# Patient Record
Sex: Female | Born: 1980 | Race: White | Hispanic: No | Marital: Single | Smoking: Never smoker
Health system: Southern US, Community
[De-identification: ages and names within clinical notes are randomized; demographics above are authoritative.]

## PROBLEM LIST (undated history)

## (undated) ENCOUNTER — Inpatient Hospital Stay (HOSPITAL_COMMUNITY): Payer: Self-pay

## (undated) DIAGNOSIS — R87613 High grade squamous intraepithelial lesion on cytologic smear of cervix (HGSIL): Secondary | ICD-10-CM

## (undated) DIAGNOSIS — Z923 Personal history of irradiation: Secondary | ICD-10-CM

## (undated) DIAGNOSIS — R059 Cough, unspecified: Secondary | ICD-10-CM

## (undated) DIAGNOSIS — J4530 Mild persistent asthma, uncomplicated: Secondary | ICD-10-CM

## (undated) DIAGNOSIS — E890 Postprocedural hypothyroidism: Secondary | ICD-10-CM

## (undated) DIAGNOSIS — C801 Malignant (primary) neoplasm, unspecified: Secondary | ICD-10-CM

## (undated) DIAGNOSIS — Z87410 Personal history of cervical dysplasia: Secondary | ICD-10-CM

## (undated) DIAGNOSIS — E0590 Thyrotoxicosis, unspecified without thyrotoxic crisis or storm: Secondary | ICD-10-CM

## (undated) DIAGNOSIS — G43909 Migraine, unspecified, not intractable, without status migrainosus: Secondary | ICD-10-CM

## (undated) DIAGNOSIS — Z87442 Personal history of urinary calculi: Secondary | ICD-10-CM

## (undated) DIAGNOSIS — Z8639 Personal history of other endocrine, nutritional and metabolic disease: Secondary | ICD-10-CM

## (undated) DIAGNOSIS — Z8619 Personal history of other infectious and parasitic diseases: Secondary | ICD-10-CM

## (undated) DIAGNOSIS — R05 Cough: Secondary | ICD-10-CM

## (undated) DIAGNOSIS — G40909 Epilepsy, unspecified, not intractable, without status epilepticus: Secondary | ICD-10-CM

## (undated) DIAGNOSIS — R87629 Unspecified abnormal cytological findings in specimens from vagina: Secondary | ICD-10-CM

## (undated) HISTORY — DX: Personal history of irradiation: Z92.3

## (undated) HISTORY — PX: LEEP: SHX91

## (undated) HISTORY — DX: Migraine, unspecified, not intractable, without status migrainosus: G43.909

## (undated) HISTORY — DX: Epilepsy, unspecified, not intractable, without status epilepticus: G40.909

## (undated) HISTORY — DX: High grade squamous intraepithelial lesion on cytologic smear of cervix (HGSIL): R87.613

## (undated) HISTORY — DX: Thyrotoxicosis, unspecified without thyrotoxic crisis or storm: E05.90

## (undated) HISTORY — DX: Mild persistent asthma, uncomplicated: J45.30

## (undated) HISTORY — PX: OTHER SURGICAL HISTORY: SHX169

---
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
## 2002-05-12 ENCOUNTER — Other Ambulatory Visit: Admission: RE | Admit: 2002-05-12 | Discharge: 2002-05-12 | Payer: Self-pay | Admitting: *Deleted

## 2002-07-01 ENCOUNTER — Ambulatory Visit (HOSPITAL_COMMUNITY): Admission: RE | Admit: 2002-07-01 | Discharge: 2002-07-01 | Payer: Self-pay | Admitting: General Surgery

## 2002-07-01 ENCOUNTER — Encounter: Payer: Self-pay | Admitting: General Surgery

## 2002-12-19 ENCOUNTER — Ambulatory Visit (HOSPITAL_COMMUNITY): Admission: RE | Admit: 2002-12-19 | Discharge: 2002-12-19 | Payer: Self-pay | Admitting: General Surgery

## 2002-12-19 ENCOUNTER — Encounter: Payer: Self-pay | Admitting: General Surgery

## 2004-01-25 ENCOUNTER — Ambulatory Visit (HOSPITAL_COMMUNITY): Admission: RE | Admit: 2004-01-25 | Discharge: 2004-01-25 | Payer: Self-pay | Admitting: General Surgery

## 2004-08-04 HISTORY — PX: NASAL SEPTUM SURGERY: SHX37

## 2005-02-26 ENCOUNTER — Other Ambulatory Visit: Admission: RE | Admit: 2005-02-26 | Discharge: 2005-02-26 | Payer: Self-pay | Admitting: *Deleted

## 2005-06-13 ENCOUNTER — Ambulatory Visit (HOSPITAL_COMMUNITY): Admission: RE | Admit: 2005-06-13 | Discharge: 2005-06-13 | Payer: Self-pay | Admitting: Otolaryngology

## 2005-06-13 ENCOUNTER — Encounter (INDEPENDENT_AMBULATORY_CARE_PROVIDER_SITE_OTHER): Payer: Self-pay | Admitting: *Deleted

## 2005-06-13 ENCOUNTER — Ambulatory Visit (HOSPITAL_BASED_OUTPATIENT_CLINIC_OR_DEPARTMENT_OTHER): Admission: RE | Admit: 2005-06-13 | Discharge: 2005-06-13 | Payer: Self-pay | Admitting: Otolaryngology

## 2007-06-24 ENCOUNTER — Other Ambulatory Visit: Admission: RE | Admit: 2007-06-24 | Discharge: 2007-06-24 | Payer: Self-pay | Admitting: Family Medicine

## 2007-07-26 ENCOUNTER — Encounter (INDEPENDENT_AMBULATORY_CARE_PROVIDER_SITE_OTHER): Payer: Self-pay | Admitting: Obstetrics & Gynecology

## 2007-07-26 ENCOUNTER — Ambulatory Visit (HOSPITAL_COMMUNITY): Admission: RE | Admit: 2007-07-26 | Discharge: 2007-07-26 | Payer: Self-pay | Admitting: Obstetrics & Gynecology

## 2007-08-05 HISTORY — PX: DILATION AND CURETTAGE OF UTERUS: SHX78

## 2007-08-05 HISTORY — PX: SIGMOIDOSCOPY: SHX6686

## 2007-11-03 ENCOUNTER — Emergency Department (HOSPITAL_COMMUNITY): Admission: EM | Admit: 2007-11-03 | Discharge: 2007-11-03 | Payer: Self-pay | Admitting: Emergency Medicine

## 2009-08-04 DIAGNOSIS — Z87442 Personal history of urinary calculi: Secondary | ICD-10-CM

## 2009-08-04 HISTORY — DX: Personal history of urinary calculi: Z87.442

## 2009-09-03 ENCOUNTER — Emergency Department (HOSPITAL_COMMUNITY): Admission: EM | Admit: 2009-09-03 | Discharge: 2009-09-03 | Payer: Self-pay | Admitting: Emergency Medicine

## 2009-11-26 ENCOUNTER — Encounter (HOSPITAL_COMMUNITY): Admission: RE | Admit: 2009-11-26 | Discharge: 2010-02-24 | Payer: Self-pay | Admitting: Endocrinology

## 2009-12-02 DIAGNOSIS — Z923 Personal history of irradiation: Secondary | ICD-10-CM

## 2009-12-02 HISTORY — DX: Personal history of irradiation: Z92.3

## 2010-02-01 DIAGNOSIS — R87613 High grade squamous intraepithelial lesion on cytologic smear of cervix (HGSIL): Secondary | ICD-10-CM

## 2010-02-01 HISTORY — DX: High grade squamous intraepithelial lesion on cytologic smear of cervix (HGSIL): R87.613

## 2010-03-14 ENCOUNTER — Ambulatory Visit: Admission: RE | Admit: 2010-03-14 | Discharge: 2010-03-14 | Payer: Self-pay | Admitting: Gynecologic Oncology

## 2010-03-29 ENCOUNTER — Ambulatory Visit (HOSPITAL_COMMUNITY): Admission: RE | Admit: 2010-03-29 | Discharge: 2010-03-29 | Payer: Self-pay | Admitting: Obstetrics and Gynecology

## 2010-10-03 ENCOUNTER — Other Ambulatory Visit: Payer: Self-pay | Admitting: Obstetrics and Gynecology

## 2010-10-18 LAB — CBC
HCT: 42.8 % (ref 36.0–46.0)
MCH: 26.9 pg (ref 26.0–34.0)
MCV: 80.2 fL (ref 78.0–100.0)
Platelets: 284 10*3/uL (ref 150–400)
RBC: 5.33 MIL/uL — ABNORMAL HIGH (ref 3.87–5.11)

## 2010-10-21 LAB — POCT PREGNANCY, URINE
Preg Test, Ur: NEGATIVE
Preg Test, Ur: NEGATIVE

## 2010-10-21 LAB — URINE MICROSCOPIC-ADD ON

## 2010-10-21 LAB — URINALYSIS, ROUTINE W REFLEX MICROSCOPIC
Bilirubin Urine: NEGATIVE
Glucose, UA: NEGATIVE mg/dL
Specific Gravity, Urine: 1.012 (ref 1.005–1.030)
Urobilinogen, UA: 0.2 mg/dL (ref 0.0–1.0)
pH: 8 (ref 5.0–8.0)

## 2010-12-17 NOTE — Op Note (Signed)
Kelly Mcclure, Kelly Mcclure              ACCOUNT NO.:  [id number]   MEDICAL RECORD NO.:  [id number]          PATIENT TYPE:  AMB   LOCATION:  SDC                           FACILITY:  [facility]   PHYSICIAN:  Ilda Mori, M.D.   DATE OF BIRTH:  04-Feb-1981   DATE OF PROCEDURE:  07/26/2007  DATE OF DISCHARGE:                               OPERATIVE REPORT   PREOPERATIVE DIAGNOSIS:  Missed abortion.   POSTOPERATIVE DIAGNOSIS:  Missed abortion.   PROCEDURE:  Dilatation and evacuation.   SURGEON:  Ilda Mori, M.D.   ANESTHESIA:  Paracervical block with IV sedation.   FINDINGS:  Products of conception consistent with an 8-week missed  abortion.   ESTIMATED BLOOD LOSS:  20 mL.   COMPLICATIONS:  None.   INDICATIONS:  This is a 30 year old primigravid female, who presented to  the office at 8 weeks' gestation with an ultrasound that showed no  development of a fetal pole.  Quantitative beta subunits were falling  over a 2-day period of observation and the diagnosis of missed abortion  was confirmed.  Options were discussed with the patient, who elected to  proceed with surgical evacuation.   PROCEDURE:  The patient was placed in the dorsal lithotomy position and  IV sedation was administered.  The perineum and vagina were prepped and  draped in a sterile fashion.  The anterior lip of the cervix was grasped  with single-tooth tenaculum and 10 mL of 2% lidocaine was infiltrated in  the paracervical tissues.  The internal os was dilated to a 21-French  and a #7 suction curette was introduced into the endometrial cavity and  the products of conception were evacuated.  The procedure was then  terminated and the patient left the operating room in good condition.      Ilda Mori, M.D.  Electronically Signed     RK/MEDQ  D:  07/26/2007  T:  07/27/2007  Job:  [id number]

## 2010-12-20 NOTE — Discharge Summary (Signed)
NAMEDAVEAH, VARONE              ACCOUNT NO.:  [id number]   MEDICAL RECORD NO.:  [id number]          PATIENT TYPE:  OUT   LOCATION:  [facility]                         FACILITY:  [facility]   PHYSICIAN:  Kristine Garbe. Ezzard Standing, M.D.DATE OF BIRTH:  May 24, 1981   DATE OF ADMISSION:  06/13/2005  DATE OF DISCHARGE:  06/13/2005                                 DISCHARGE SUMMARY   ADMITTING DIAGNOSES:  1.  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  2.  History of seizure disorder.   OPERATIONS:  During this hospitalization:  1.  Septoplasty.  2.  Turbinate reductions on June 13, 2005.   CONSULTATIONS:  None.   HOSPITAL COURSE:  The patient was admitted via the operating room, on  June 13, 2005, and underwent septoplasty and turbinate reductions.  Postoperatively she did well and was subsequently discharged home with a  packing in her nose.   DISCHARGE MEDICATIONS:  1.  Vicodin 1-2 q.4h. p.r.n. pain.  2.  Keflex 500 mg b.i.d. for one week.  In addition to her regular medications Topamax, Synthroid, atenolol, and  Aleve.   DISPOSITION:  The patient is discharged home.  We will plan on removing the  packing in the morning and have her follow up in my office in one week for  recheck.           ______________________________  Kristine Garbe. Ezzard Standing, M.D.     CEN/MEDQ  D:  07/11/2005  T:  07/12/2005  Job:  [id number]

## 2010-12-20 NOTE — Op Note (Signed)
NAMELYNNDA, WIERSMA              ACCOUNT NO.:  [id number]   MEDICAL RECORD NO.:  [id number]          PATIENT TYPE:  AMB   LOCATION:  DSC                          FACILITY:  [facility]   PHYSICIAN:  Christopher E. Ezzard Standing, M.D.DATE OF BIRTH:  09-30-1980   DATE OF PROCEDURE:  06/13/2005  DATE OF DISCHARGE:                                 OPERATIVE REPORT   PREOPERATIVE DIAGNOSIS:  Septal deviation with nasal obstruction, turbinate  hypertrophy, snoring.   POSTOPERATIVE DIAGNOSIS:  Septal deviation with nasal obstruction, turbinate  hypertrophy, snoring.   OPERATION:  Septoplasty with bilateral inferior turbinate reductions.   SURGEON:  Kristine Garbe. Ezzard Standing, M.D.   ANESTHESIA:  General endotracheal anesthesia.   COMPLICATIONS:  None.   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  She has had a history of nasal obstruction, generally worse on  the right side.  She also complains of snoring.  She is taken to the  operating room at this time for septoplasty and turbinate reduction to help  improve her nasal airway.   DESCRIPTION OF PROCEDURE:  After adequate endotracheal anesthesia, the  patient received 1 gram Ancef IV preoperatively.  The nose was examined.  She had curvature of the anterior cartilaginous septum to the right airway  with a narrowed right nasal airway compared to the left, more posteriorly  she had a large bony spur posterior on the left side.  In addition, she had  very thickened hypertrophied septal mucosa, especially on the left side.  I  suspect this is complicating for the curvature of the septum to the right.  Because this was very thickened and almost appeared as a hemangioma or  lymphangioma, a frozen section biopsy was obtained from the septal mucosa on  the left side and sent for frozen.  The pathology report revealed only  normal appearing mucosa with excessive submucosal vasculature consistent  with benign  hypertrophy of the mucosa.  The septum and turbinates were  injected with Xylocaine with epinephrine.  A hemitransfixion incision was  made along the cartilaginous septum on the right side.  Mucoperichondrial  and mucoperiosteal flaps were elevated posteriorly.  Between the  cartilaginous and bony septum, a vertical incision was made and  mucoperiosteum was elevated on either side of the septal bone more  posteriorly.  A large septal spur on the left side was removed.  A portion  of the lower cartilaginous septum strip, about 1-2 mm, along the maxillary  crest was removed to allow the septum to become more straight.  This  completed the septoplasty portion of the procedure.  The hemitransfixion  incision was closed with interrupted 4-0 chromic suture.  The septum was  basted with a 4-0 chromic suture.   Following this, inferior turbinate reductions were performed.  The  turbinates were injected with Xylocaine with epinephrine and an incision was  made along the anterior inferior portion of the turbinate.  The turbinate  mucosa was elevated off the turbinate bone and the turbinate bone was  removed and submucosal cauterization was performed with the suction cautery.  At the very distal end of the inferior turbinate, the distal mucosa was  hypertrophied and this was cauterized to reduce the size of the posterior  inferior turbinate.  This completed the procedure.  The nose was then packed  with Telfa soaked in Bacitracin ointment bilaterally.  The packing was  secured with a single 2-0 silk suture tied beneath the columella.  Shanigua  was awakened from anesthesia and transferred to the recovery room  postoperatively doing well.   DISPOSITION:  Sotiria was discharged home later this morning on Keflex 500 mg  b.i.d. for five days, Tylenol and Vicodin p.r.n. pain.  She will remove the  packing in the morning at home and will follow up in my office on Monday.            ______________________________  Kristine Garbe. Ezzard Standing, M.D.     CEN/MEDQ  D:  06/13/2005  T:  06/13/2005  Job:  [id number]   cc:   Chales Salmon. Abigail Miyamoto, M.D.  Fax: [phone]

## 2011-01-15 ENCOUNTER — Other Ambulatory Visit: Payer: Self-pay | Admitting: Obstetrics and Gynecology

## 2011-05-09 LAB — CBC
MCHC: 33.8
MCV: 79.7
Platelets: 290
RDW: 13.1

## 2011-10-21 ENCOUNTER — Encounter: Payer: Self-pay | Admitting: Gynecologic Oncology

## 2011-10-22 ENCOUNTER — Encounter: Payer: Self-pay | Admitting: Gynecologic Oncology

## 2011-10-22 ENCOUNTER — Ambulatory Visit: Payer: BC Managed Care – PPO | Attending: Gynecologic Oncology | Admitting: Gynecologic Oncology

## 2011-10-22 VITALS — BP 108/68 | HR 89 | Temp 98.1°F | Resp 18 | Ht 65.0 in | Wt 142.0 lb

## 2011-10-22 DIAGNOSIS — R87613 High grade squamous intraepithelial lesion on cytologic smear of cervix (HGSIL): Secondary | ICD-10-CM | POA: Insufficient documentation

## 2011-10-22 DIAGNOSIS — Z79899 Other long term (current) drug therapy: Secondary | ICD-10-CM | POA: Insufficient documentation

## 2011-10-22 DIAGNOSIS — IMO0002 Reserved for concepts with insufficient information to code with codable children: Secondary | ICD-10-CM

## 2011-10-22 DIAGNOSIS — C539 Malignant neoplasm of cervix uteri, unspecified: Secondary | ICD-10-CM | POA: Insufficient documentation

## 2011-10-22 NOTE — Patient Instructions (Signed)
Return in 3 months.

## 2011-10-22 NOTE — Progress Notes (Signed)
Consult Note: Gyn-Onc  Richrd Sox 31 y.o. female  CC:  Chief Complaint  Patient presents with  . HGSIL    Follow up    HPI:  This is a 31 year old gravida 1, para 0, last normal menstrual period on 10/14/11.  She reports history of abnormal Paps since her sexual debut at the age of 9.  No prior procedures were performed.  She reports abnormal Pap in January 2011, endocervical curettage on February 18, 2010 and it demonstrated a high-grade squamous intraepithelial lesion.  She went a cervical LEEP on February 25, 2010.  The LEEP is notable for an invasive tumor measuring 2 mm x 3 mm. 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  A high-grade squamous intraepithelial lesion was present at the surgical margin sites.  Because of this she underwent a cold knife conization 03/29/2000. 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 Margins were negative for dysplasia or malignancy. Endocervical curettage was also negative. She did well for about a year and again in November 2012 had had an abnormal Pap smears with a negative endocervical curettage. She underwent a LEEP December 2012. The pathology revealed focal changes suggestive of previous biopsy site. It was negative for dysplasia or malignancy. The final surgical margins were free of atypia. She had a repeat Pap smear in March of 2013 again showed high-grade dysplasia. Endocervical curettage was negative. She comes in today for followup and reevaluation.   Interval History: She otherwise has no complaints her last cycle was March 12,13. She continues on Sprintec for contraception.  Review of Systems: Negative  Current Meds:  Outpatient Encounter Prescriptions as of 10/22/2011  Medication Sig Dispense Refill  . aspirin-acetaminophen-caffeine (EXCEDRIN MIGRAINE) 250-250-65 MG per tablet Take 1 tablet by mouth as needed.      Marland Kitchen atenolol (TENORMIN) 25 MG tablet Take  25 mg by mouth daily.      . butalbital-acetaminophen-caffeine (FIORICET, ESGIC) 50-325-40 MG per tablet Take 1 tablet by mouth as needed.      . hyoscyamine (LEVSIN, ANASPAZ) 0.125 MG tablet Take 0.125 mg by mouth as needed.      . lamoTRIgine (LAMICTAL) 100 MG tablet Take 100 mg by mouth 2 (two) times daily.      . LevETIRAcetam (KEPPRA XR PO) Take 500 mg by mouth 2 (two) times daily.       Marland Kitchen levothyroxine (SYNTHROID, LEVOTHROID) 100 MCG tablet Take 100 mcg by mouth daily.      . Multiple Vitamins-Minerals (MULTIVITAMIN GUMMIES ADULT PO) Take by mouth daily.      . naproxen sodium (ANAPROX) 220 MG tablet Take 220 mg by mouth as needed.      . Norgestimate-Ethinyl Estradiol Triphasic (TRI-SPRINTEC) 0.18/0.215/0.25 MG-35 MCG tablet Take 1 tablet by mouth daily.      . phentermine 37.5 MG capsule Take 37.5 mg by mouth every morning.      . Probiotic Product (ALIGN PO) Take 1 capsule by mouth daily.      Marland Kitchen DISCONTD: levothyroxine (SYNTHROID, LEVOTHROID) 50 MCG tablet Take 50 mcg by mouth daily.        Allergy:  Allergies  Allergen Reactions  . Relpax (Eletriptan Hydrobromide)     Stroke-like symptoms  . Ultram (Tramadol Hcl)     Seizure    Social Hx:   History   Social History  . Marital Status: Single    Spouse Name: N/A    Number of Children: N/A  .  Years of Education: N/A   Occupational History  . Not on file.   Social History Main Topics  . Smoking status: Never Smoker   . Smokeless tobacco: Not on file  . Alcohol Use: Yes     Rare  . Drug Use:   . Sexually Active:    Other Topics Concern  . Not on file   Social History Narrative  . No narrative on file    Past Surgical Hx:  Past Surgical History  Procedure Date  . Leep 02/25/2010  . Dilation and curettage of uterus 2009  . Tonsillectomy 1995  . Adenoidectomy 1995  . Nasal septum surgery 2006    Repair of deviated septum  . Leep 07/25/11    Past Medical Hx:  Past Medical History  Diagnosis Date  .  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 Seizure disorder     Diagnosed at age 6  . Migraines   . Nephrolithiasis 08/2009  . 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 H/O radioactive iodine thyroid ablation 12/2009    Family Hx:  Family History  Problem Relation Age of Onset  . Cervical cancer Mother   . Diabetes Mother   . Skin cancer Mother   . Hypertension Father   . Leukemia Other   . Diabetes Other   . Skin cancer Maternal Aunt     Vitals:  Blood pressure 108/68, pulse 89, temperature 98.1 F (36.7 C), resp. rate 18, height 5\' 5"  (1.651 m), weight 142 lb (64.411 kg), last menstrual period 10/14/2011.  Physical Exam: Well-nourished well-developed female in no acute distress.  Pelvic: Normal external female genitalia. The vagina is well epithelialized. The cervix is status post excisional procedures. There is no gross visible lesions. As a physiologic discharge. Colposcopic evaluation was performed after the application acetic acid. There is a small rough area at the 12:00 position it is not acetowhite epithelial enhancing. Colposcopy was inadequate as the entire transformation zone cannot be seen. Lugol's solution was then placed on the cervix. There are no Lugol's nonstaining lesions noted. Bimanual examination the cervix is palpably normal the purposes of normal size shape and consistency. There were no adnexal masses  Assessment/Plan: 31 year old gravida 1 para 0 with a stage IA 1 cervical carcinoma who has undergone a LEEP which diagnosed invasive carcinoma 2011. She then had a cold knife conization in 2011 that was negative. Repeat cytology again was positive for evaluation was negative and she underwent a LEEP in December 2012 it was similarly negative for any dysplasia. Followup Pap smear March of this year again shown high-grade dysplasia with a negative endocervical curettage. Examination and colposcopy today were both negative.  Plan:  At this point as you negative endocervical curettage by Dr. Ricka Burdock I think it is of limited benefit to repeat one today. I discussed having her start folic acid as there is some data suggesting folic acid deficiencies may be associated with recurrent dysplastic processes. Also discussed the possible benefit of HPV vaccination even though she is RE HPV exposed. Based on data from the registration FDA trials there may be some decreased risk of subsequent dysplastic issues and patient to subsequent received HPV vaccination. Recently data also supports this. She will discuss this with Dr. Billy Coast. She will return to see me in 3 months for a repeat Pap smear colposcopy and endocervical curettage.    Selah Zelman A., MD 10/22/2011, 8:34 AM

## 2012-01-22 ENCOUNTER — Ambulatory Visit: Payer: BC Managed Care – PPO | Admitting: Gynecologic Oncology

## 2012-03-03 ENCOUNTER — Ambulatory Visit: Payer: BC Managed Care – PPO | Attending: Gynecologic Oncology | Admitting: Gynecologic Oncology

## 2012-03-03 ENCOUNTER — Encounter: Payer: Self-pay | Admitting: Gynecologic Oncology

## 2012-03-03 ENCOUNTER — Other Ambulatory Visit (HOSPITAL_COMMUNITY)
Admission: RE | Admit: 2012-03-03 | Discharge: 2012-03-03 | Disposition: A | Discharge status: Home / Self Care | Payer: BC Managed Care – PPO | Source: Ambulatory Visit | Attending: Gynecologic Oncology | Admitting: Gynecologic Oncology

## 2012-03-03 VITALS — BP 110/72 | HR 78 | Temp 98.3°F | Resp 18 | Ht 65.0 in | Wt 142.9 lb

## 2012-03-03 DIAGNOSIS — Z79899 Other long term (current) drug therapy: Secondary | ICD-10-CM | POA: Insufficient documentation

## 2012-03-03 DIAGNOSIS — IMO0002 Reserved for concepts with insufficient information to code with codable children: Secondary | ICD-10-CM

## 2012-03-03 DIAGNOSIS — R87613 High grade squamous intraepithelial lesion on cytologic smear of cervix (HGSIL): Secondary | ICD-10-CM | POA: Insufficient documentation

## 2012-03-03 DIAGNOSIS — Z01419 Encounter for gynecological examination (general) (routine) without abnormal findings: Secondary | ICD-10-CM | POA: Insufficient documentation

## 2012-03-03 NOTE — Patient Instructions (Signed)
Complete vaccine series and continue folic acid.

## 2012-03-03 NOTE — Progress Notes (Signed)
Consult Note: Gyn-Onc  Kelly Mcclure 31 y.o. female  CC:  Chief Complaint  Patient presents with  . HGSIL    Follow up    HPI:  This is a 31 year old gravida 1, para 0, last normal menstrual period on 10/14/11.  She reports history of abnormal Paps since her sexual debut at the age of 44.  No prior procedures were performed.  She reports abnormal Pap in January 2011, endocervical curettage on February 18, 2010 and it demonstrated a high-grade squamous intraepithelial lesion.  She went a cervical LEEP on February 25, 2010.  The LEEP is notable for an invasive tumor measuring 2 mm x 3 mm. 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  A high-grade squamous intraepithelial lesion was present at the surgical margin sites.  Because of this she underwent a cold knife conization 03/29/2000. 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 Margins were negative for dysplasia or malignancy. Endocervical curettage was also negative. She did well for about a year and again in November 2012 had had an abnormal Pap smears with a negative endocervical curettage. She underwent a LEEP December 2012. The pathology revealed focal changes suggestive of previous biopsy site. It was negative for dysplasia or malignancy. The final surgical margins were free of atypia. She had a repeat Pap smear in March of 2013 again showed high-grade dysplasia. Endocervical curettage was negative. She comes in today for followup and reevaluation.   Interval History: She otherwise has no complaints her last cycle started today.  She has received 2 of 3 Gardasil vaccines with her last one due in 9/13.  She is also on folic acid 1 mg daily. She continues on Sprintec for contraception.  Review of Systems: Negative  Current Meds:  Outpatient Encounter Prescriptions as of 10/22/2011  Medication Sig Dispense Refill  . aspirin-acetaminophen-caffeine (EXCEDRIN  MIGRAINE) 250-250-65 MG per tablet Take 1 tablet by mouth as needed.      Marland Kitchen atenolol (TENORMIN) 25 MG tablet Take 25 mg by mouth daily.      . butalbital-acetaminophen-caffeine (FIORICET, ESGIC) 50-325-40 MG per tablet Take 1 tablet by mouth as needed.      . hyoscyamine (LEVSIN, ANASPAZ) 0.125 MG tablet Take 0.125 mg by mouth as needed.      . lamoTRIgine (LAMICTAL) 100 MG tablet Take 100 mg by mouth 2 (two) times daily.      . LevETIRAcetam (KEPPRA XR PO) Take 500 mg by mouth 2 (two) times daily.       Marland Kitchen levothyroxine (SYNTHROID, LEVOTHROID) 100 MCG tablet Take 100 mcg by mouth daily.      . Multiple Vitamins-Minerals (MULTIVITAMIN GUMMIES ADULT PO) Take by mouth daily.      . naproxen sodium (ANAPROX) 220 MG tablet Take 220 mg by mouth as needed.      . Norgestimate-Ethinyl Estradiol Triphasic (TRI-SPRINTEC) 0.18/0.215/0.25 MG-35 MCG tablet Take 1 tablet by mouth daily.      . phentermine 37.5 MG capsule Take 37.5 mg by mouth every morning.      . Probiotic Product (ALIGN PO) Take 1 capsule by mouth daily.      Marland Kitchen DISCONTD: levothyroxine (SYNTHROID, LEVOTHROID) 50 MCG tablet Take 50 mcg by mouth daily.        Allergy:  Allergies  Allergen Reactions  . Relpax (Eletriptan Hydrobromide)     Stroke-like symptoms  . Ultram (Tramadol Hcl)     Seizure    Social Hx:  History   Social History  . Marital Status: Single    Spouse Name: N/A    Number of Children: N/A  . Years of Education: N/A   Occupational History  . Not on file.   Social History Main Topics  . Smoking status: Never Smoker   . Smokeless tobacco: Not on file  . Alcohol Use: Yes     Rare  . Drug Use:   . Sexually Active:    Other Topics Concern  . Not on file   Social History Narrative  . No narrative on file    Past Surgical Hx:  Past Surgical History  Procedure Date  . Leep 02/25/2010  . Dilation and curettage of uterus 2009  . Tonsillectomy 1995  . Adenoidectomy 1995  . Nasal septum surgery 2006     Repair of deviated septum  . Leep 07/25/11    Past Medical Hx:  Past Medical History  Diagnosis Date  . 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 Seizure disorder     Diagnosed at age 57  . Migraines   . Nephrolithiasis 08/2009  . 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 H/O radioactive iodine thyroid ablation 12/2009    Family Hx:  Family History  Problem Relation Age of Onset  . Cervical cancer Mother   . Diabetes Mother   . Skin cancer Mother   . Hypertension Father   . Leukemia Other   . Diabetes Other   . Skin cancer Maternal Aunt     Vitals:  Blood pressure 11078, pulse 78, temperature 98.3 F, resp. rate 18, height 5\' 5"  (1.651 m), weight 142 lb (64.411 kg), last menstrual period 03/03/12.  Physical Exam: Well-nourished well-developed female in no acute distress.  Pelvic: Normal external female genitalia. The vagina is well epithelialized. The cervix is status post excisional procedures. There is no gross visible lesions. As a physiologic discharge and slight menstrual flow.  Pap smear was submitted. Colposcopic evaluation was performed after the application acetic acid. Colposcopy was inadequate as the entire transformation zone cannot be seen even with the aid of a Q-tip.  There were no acetic acid enhancing areas. ECC was performed Bimanual examination the cervix is palpably normal the purposes of normal size shape and consistency. There were no adnexal masses  Assessment/Plan: 31 year old gravida 1 para 0 with a stage IA 1 cervical carcinoma who has undergone a LEEP which diagnosed invasive carcinoma 2011. She then had a cold knife conization in 2011 that was negative. Repeat cytology again was abnormal and she underwent a LEEP in December 2012 it was similarly negative for any dysplasia. Followup Pap smear March of this year again shown high-grade dysplasia with a negative endocervical curettage. Examination and colposcopy today were  both negative.  Plan: We will follow up on the results of her pap smear and ECC from today and we will call her with the results.  We will determine her disposition pending these results.  She will continue on her folic acid and complete her HPV vaccine series.    Bryar Dahms A., MD 10/22/2011, 8:34 AM

## 2012-03-08 ENCOUNTER — Telehealth: Payer: Self-pay | Admitting: Gynecologic Oncology

## 2012-03-08 NOTE — Telephone Encounter (Signed)
Message left asking patient to please call back to discuss test results and schedule a follow up appointment.

## 2012-03-08 NOTE — Telephone Encounter (Signed)
Pt informed of test results and follow up appointment arranged with Dr. Duard Brady on March 24, 2012.  No concerns or questions voiced.

## 2012-03-24 ENCOUNTER — Ambulatory Visit: Payer: BC Managed Care – PPO | Attending: Gynecologic Oncology | Admitting: Gynecologic Oncology

## 2012-03-24 ENCOUNTER — Encounter: Payer: Self-pay | Admitting: Gynecologic Oncology

## 2012-03-24 VITALS — BP 102/70 | HR 64 | Temp 97.8°F | Resp 16 | Ht 65.0 in | Wt 145.2 lb

## 2012-03-24 DIAGNOSIS — IMO0002 Reserved for concepts with insufficient information to code with codable children: Secondary | ICD-10-CM

## 2012-03-24 DIAGNOSIS — Z79899 Other long term (current) drug therapy: Secondary | ICD-10-CM | POA: Insufficient documentation

## 2012-03-24 DIAGNOSIS — D069 Carcinoma in situ of cervix, unspecified: Secondary | ICD-10-CM | POA: Insufficient documentation

## 2012-03-24 NOTE — Patient Instructions (Signed)
RTC 3 months

## 2012-03-24 NOTE — Progress Notes (Signed)
Consult Note: Gyn-Onc  Kelly Mcclure 31 y.o. female  CC:  Chief Complaint  Patient presents with  . HGSIL    Follow up    HPI: This is a 31 year old gravida 1, para 0, last normal menstrual period on 10/14/11. She reports history of abnormal Paps since her sexual debut at the age of 76. No prior procedures were performed. She reports abnormal Pap in January 2011, endocervical curettage on February 18, 2010 and it demonstrated a high-grade squamous intraepithelial lesion. She went a cervical LEEP on February 25, 2010. The LEEP was notable for an invasive tumor measuring 2 mm x 3 mm. 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 A high-grade squamous intraepithelial lesion was present at the surgical margin sites. Because of this she underwent a cold knife conization 03/29/2010. 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 Margins were negative for dysplasia or malignancy. Endocervical curettage was also negative. She did well for about a year and again in November 2012 had had an abnormal Pap smears with a negative endocervical curettage. She underwent a LEEP December 2012. The pathology revealed focal changes suggestive of previous biopsy site. It was negative for dysplasia or malignancy. The final surgical margins were free of atypia. She had a repeat Pap smear in March of 2013 again showed high-grade dysplasia. Endocervical curettage was negative. Similar situation in July which time I saw her in a Pap smear revealed high-grade dysplasia she had negative endocervical curettage and while inadequate and negative colposcopy.   Interval History: She otherwise has no complaints her last cycle started today. She has received 2 of 3 Gardasil vaccines with her last one due in 9/13. She is also on folic acid 1 mg daily. She continues on Sprintec for contraception.   Current Meds:  Outpatient Encounter Prescriptions as of  03/24/2012  Medication Sig Dispense Refill  . aspirin-acetaminophen-caffeine (EXCEDRIN MIGRAINE) 250-250-65 MG per tablet Take 1 tablet by mouth as needed.      Marland Kitchen atenolol (TENORMIN) 25 MG tablet Take 25 mg by mouth daily.      . butalbital-acetaminophen-caffeine (FIORICET, ESGIC) 50-325-40 MG per tablet Take 1 tablet by mouth as needed. Once every other week or once weekly      . hyoscyamine (LEVSIN, ANASPAZ) 0.125 MG tablet Take 0.125 mg by mouth as needed.      . lamoTRIgine (LAMICTAL) 100 MG tablet Take 100 mg by mouth 2 (two) times daily.      . LevETIRAcetam (KEPPRA XR PO) Take 500 mg by mouth 2 (two) times daily.       Marland Kitchen levothyroxine (SYNTHROID, LEVOTHROID) 100 MCG tablet Take 100 mcg by mouth daily.      . Multiple Vitamins-Minerals (MULTIVITAMIN GUMMIES ADULT PO) Take by mouth daily.      . naproxen sodium (ANAPROX) 220 MG tablet Take 220 mg by mouth as needed.      . Norgestimate-Ethinyl Estradiol Triphasic (TRI-SPRINTEC) 0.18/0.215/0.25 MG-35 MCG tablet Take 1 tablet by mouth daily.      . phentermine 37.5 MG capsule Take 37.5 mg by mouth every morning.      . Probiotic Product (ALIGN PO) Take 1 capsule by mouth daily.        Allergy:  Allergies  Allergen Reactions  . Relpax (Eletriptan Hydrobromide)     Stroke-like symptoms  . Ultram (Tramadol Hcl)     Seizure    Social Hx:   History   Social History  .  Marital Status: Single    Spouse Name: N/A    Number of Children: N/A  . Years of Education: N/A   Occupational History  . Not on file.   Social History Main Topics  . Smoking status: Never Smoker   . Smokeless tobacco: Not on file  . Alcohol Use: Yes     Rare  . Drug Use:   . Sexually Active:    Other Topics Concern  . Not on file   Social History Narrative  . No narrative on file    Past Surgical Hx:  Past Surgical History  Procedure Date  . Leep 02/25/2010  . Dilation and curettage of uterus 2009  . Tonsillectomy 1995  . Adenoidectomy 1995  .  Nasal septum surgery 2006    Repair of deviated septum  . Leep 07/25/11    Past Medical Hx:  Past Medical History  Diagnosis Date  . 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 Seizure disorder     Diagnosed at age 82  . Migraines   . Nephrolithiasis 08/2009  . 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 H/O radioactive iodine thyroid ablation 12/2009    Family Hx:  Family History  Problem Relation Age of Onset  . Cervical cancer Mother   . Diabetes Mother   . Skin cancer Mother   . Hypertension Father   . Leukemia Other   . Diabetes Other   . Skin cancer Maternal Aunt     Vitals:  Blood pressure 102/70, pulse 64, temperature 97.8 F (36.6 C), temperature source Oral, resp. rate 16, height 5\' 5"  (1.651 m), weight 145 lb 3.2 oz (65.862 kg), last menstrual period 03/03/2012.  Physical Exam: Well-nourished well-developed female in no acute distress.  Assessment/Plan: 31 year old gravida 1 para 96045 squamous cell carcinoma cervix diagnosed in 2011 at the time of a LEEP. Positive margins were concerning patient on a cold knife conization in August of 2011. She did well until November 2012 at which time again had abnormal Pap smears with a negative endocervical curettage. She underwent a LEEP at that time that revealed focal changes suggestive of biopsy site there is no dysplasia or malignancy. I have seen her in the clinic twice once in March of this year as well as in July of this year at which time her Pap smear is revealed high-grade changes but she negative endocervical curettage is and negative colposcopies. She comes in with her mother and her husband today to discuss these findings.  Greater than 30 minutes face to face time was spent with the patient and her family discussing this. She has quite a short cervix we discussed with her multiple procedures and she's had risk of cervical incompetence. We also discussed cervical stenosis. She is a  very compliant patient and at this point with a negative endocervical curettage I would feel comfortable continuing to follow her expectantly. She understands the concerns of there being a potentially endocervical lesion but we will see her in follow her closely. She understands that if her endocervical curettage were to be positive for the lesion which to open the cervix that was biopsy positive we may need to proceed with another excisional procedure. At this point she would very happy with a plan of close followup. She is very reliable will return to see me in 3 months. She'll continue her folic acid we'll complete her HPV vaccination series in the next few weeks.  Cleda Mccreedy A., MD  03/24/2012, 3:44 PM

## 2012-04-01 ENCOUNTER — Ambulatory Visit: Payer: BC Managed Care – PPO | Admitting: Gynecologic Oncology

## 2012-07-08 ENCOUNTER — Ambulatory Visit: Payer: BC Managed Care – PPO | Admitting: Gynecologic Oncology

## 2012-07-14 ENCOUNTER — Ambulatory Visit: Payer: BC Managed Care – PPO | Admitting: Gynecologic Oncology

## 2012-07-16 ENCOUNTER — Encounter: Payer: Self-pay | Admitting: Gynecology

## 2012-07-16 ENCOUNTER — Other Ambulatory Visit (HOSPITAL_COMMUNITY)
Admission: RE | Admit: 2012-07-16 | Discharge: 2012-07-16 | Disposition: A | Discharge status: Home / Self Care | Payer: BC Managed Care – PPO | Source: Ambulatory Visit | Attending: Gynecology | Admitting: Gynecology

## 2012-07-16 ENCOUNTER — Ambulatory Visit: Payer: BC Managed Care – PPO | Attending: Gynecology | Admitting: Gynecology

## 2012-07-16 VITALS — BP 110/70 | HR 70 | Temp 97.6°F | Resp 18 | Ht 65.0 in | Wt 152.0 lb

## 2012-07-16 DIAGNOSIS — C539 Malignant neoplasm of cervix uteri, unspecified: Secondary | ICD-10-CM

## 2012-07-16 DIAGNOSIS — Z01419 Encounter for gynecological examination (general) (routine) without abnormal findings: Secondary | ICD-10-CM | POA: Insufficient documentation

## 2012-07-16 DIAGNOSIS — Z79899 Other long term (current) drug therapy: Secondary | ICD-10-CM | POA: Insufficient documentation

## 2012-07-16 DIAGNOSIS — Z09 Encounter for follow-up examination after completed treatment for conditions other than malignant neoplasm: Secondary | ICD-10-CM | POA: Insufficient documentation

## 2012-07-16 DIAGNOSIS — N879 Dysplasia of cervix uteri, unspecified: Secondary | ICD-10-CM | POA: Insufficient documentation

## 2012-07-16 NOTE — Patient Instructions (Addendum)
We will contact you with the Pap smear and biopsy report

## 2012-07-16 NOTE — Progress Notes (Signed)
Consult Note: Gyn-Onc   Kelly Mcclure 31 y.o. female  Chief Complaint  Patient presents with  . f/u cervical cancer    Interval History: Returns today as previously scheduled for followup of an abnormal Pap smear. Since her last visit, she has done well. She has regular cyclic menstrual periods. She has no intermenstrual spotting or pelvic pain. She's using oral contraceptives without difficulty.  HPI: Patient had an abnormal Pap smear in 2011. 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 Surgical margins were positive. Therefore, the patient underwent a cold knife conization on 04/01/2010. Pathology from that specimen showed severe dysplasia with negative margins. The patient was subsequently followed with Pap smears. A passer March 2012 was ascus. ECC was negative. Repeat ECC in June 2012 was negative. Pap smear in 03/03/2012 showed high-grade SIL and again ECC was negative. Conservative followup was recommended.  Review of Systems:10 point review of systems is negative as noted above.   Vitals: Blood pressure 110/70, pulse 70, temperature 97.6 F (36.4 C), resp. rate 18, height 5\' 5"  (1.651 m), weight 152 lb (68.947 kg), last menstrual period 06/21/2012.  Physical Exam: General : The patient is a healthy woman in no acute distress.  HEENT: normocephalic, extraoccular movements normal; neck is supple without thyromegally  Lynphnodes: Supraclavicular and inguinal nodes not enlarged  Abdomen: Soft, non-tender, no ascites, no organomegally, no masses, no hernias  Pelvic:  EGBUS: Normal female  Vagina: Normal, no lesions  Urethra and Bladder: Normal, non-tender  Cervix: Flush with the vaginal vault and difficult to fully visualize.  Uterus: Anterior normal shape size and consistency Bi-manual examination: Non-tender; no adenxal masses or nodularity  Rectal: normal sphincter tone, no masses, no blood  Lower extremities: No  edema or varicosities. Normal range of motion   Procedure note: Colposcopic examination is undertaken using a green filter and 3% acetic acid. The cervix is very difficult to visualize. On the other hand, there is approximately a 1 cm raised white lesion approximately 2 cm from the cervix at 6:00 on the posterior vagina. After applying Hurricaine gel, representative biopsies are obtained. Hemostasis is achieved with silver nitrate.  Prior to colposcopy and biopsy a Pap smears obtained   Assessment/Plan: Past history of stage IAI squamous cell carcinoma the cervix status post cold knife conization with negative margins. The patient has persistent abnormal Pap smears. Today I believe I have identified a lesion in the vagina. Biopsies were are pending. If these do show high-grade SIL, options would include CO2 laser vaporization versus Efudex versus wide local excision. We will contact the patient with the biopsy reports and developed a treatment plan thereafter.  Allergies  Allergen Reactions  . Relpax (Eletriptan Hydrobromide)     Stroke-like symptoms  . Ultram (Tramadol Hcl)     Seizure    Past Medical History  Diagnosis Date  . 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 Seizure disorder     Diagnosed at age 53  . Migraines   . Nephrolithiasis 08/2009  . 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 H/O radioactive iodine thyroid ablation 12/2009    Past Surgical History  Procedure Date  . Leep 02/25/2010  . Dilation and curettage of uterus 2009  . Tonsillectomy 1995  . Adenoidectomy 1995  . Nasal septum surgery 2006    Repair of deviated septum  . Leep 07/25/11    Current Outpatient Prescriptions  Medication Sig  Dispense Refill  . aspirin-acetaminophen-caffeine (EXCEDRIN MIGRAINE) 250-250-65 MG per tablet Take 1 tablet by mouth as needed.      Marland Kitchen atenolol (TENORMIN) 25 MG tablet Take 25 mg by mouth daily.      . butalbital-acetaminophen-caffeine  (FIORICET, ESGIC) 50-325-40 MG per tablet Take 1 tablet by mouth as needed. Once every other week or once weekly      . lamoTRIgine (LAMICTAL) 100 MG tablet Take 100 mg by mouth 2 (two) times daily.      . LevETIRAcetam (KEPPRA XR PO) Take 500 mg by mouth 5 (five) times daily.       Marland Kitchen levothyroxine (SYNTHROID, LEVOTHROID) 100 MCG tablet Take 100 mcg by mouth daily.      . Multiple Vitamins-Minerals (MULTIVITAMIN GUMMIES ADULT PO) Take by mouth daily.      . naproxen sodium (ANAPROX) 220 MG tablet Take 220 mg by mouth as needed.      . Norgestimate-Ethinyl Estradiol Triphasic (TRI-SPRINTEC) 0.18/0.215/0.25 MG-35 MCG tablet Take 1 tablet by mouth daily.      . Probiotic Product (ALIGN PO) Take 1 capsule by mouth daily.      . hyoscyamine (LEVSIN, ANASPAZ) 0.125 MG tablet Take 0.125 mg by mouth as needed.      . phentermine 37.5 MG capsule Take 37.5 mg by mouth every morning.        History   Social History  . Marital Status: Single    Spouse Name: N/A    Number of Children: N/A  . Years of Education: N/A   Occupational History  . Not on file.   Social History Main Topics  . Smoking status: Never Smoker   . Smokeless tobacco: Not on file  . Alcohol Use: Yes     Comment: Rare  . Drug Use:   . Sexually Active:    Other Topics Concern  . Not on file   Social History Narrative  . No narrative on file    Family History  Problem Relation Age of Onset  . Cervical cancer Mother   . Diabetes Mother   . Skin cancer Mother   . Hypertension Father   . Leukemia Other   . Diabetes Other   . Skin cancer Maternal Aunt       CLARKE-PEARSON,Fujie Dickison L, MD 07/16/2012, 8:16 AM

## 2012-07-23 ENCOUNTER — Telehealth: Payer: Self-pay | Admitting: Gynecologic Oncology

## 2012-07-23 NOTE — Telephone Encounter (Signed)
Patient notified of pap smear and biopsy results. Informed that she would be notified of further recommendations per Dr. Stanford Breed when available. Instructed to call the office for any questions or concerns.

## 2012-08-06 ENCOUNTER — Telehealth: Payer: Self-pay | Admitting: *Deleted

## 2012-08-06 NOTE — Telephone Encounter (Signed)
Pt notified of Dr Nelwyn Salisbury recommendation for f/u in Feb.  Appt 10/01/12 @ 0730 given

## 2012-10-01 ENCOUNTER — Encounter: Payer: Self-pay | Admitting: Gynecology

## 2012-10-01 ENCOUNTER — Other Ambulatory Visit (HOSPITAL_COMMUNITY)
Admission: RE | Admit: 2012-10-01 | Discharge: 2012-10-01 | Disposition: A | Discharge status: Home / Self Care | Payer: BC Managed Care – PPO | Source: Ambulatory Visit | Attending: Gynecology | Admitting: Gynecology

## 2012-10-01 ENCOUNTER — Ambulatory Visit: Payer: BC Managed Care – PPO | Attending: Gynecology | Admitting: Gynecology

## 2012-10-01 VITALS — BP 120/70 | HR 70 | Temp 98.2°F | Resp 16 | Ht 65.0 in | Wt 150.0 lb

## 2012-10-01 DIAGNOSIS — IMO0002 Reserved for concepts with insufficient information to code with codable children: Secondary | ICD-10-CM

## 2012-10-01 DIAGNOSIS — Z01419 Encounter for gynecological examination (general) (routine) without abnormal findings: Secondary | ICD-10-CM | POA: Insufficient documentation

## 2012-10-01 DIAGNOSIS — C52 Malignant neoplasm of vagina: Secondary | ICD-10-CM | POA: Insufficient documentation

## 2012-10-01 DIAGNOSIS — C539 Malignant neoplasm of cervix uteri, unspecified: Secondary | ICD-10-CM | POA: Insufficient documentation

## 2012-10-01 NOTE — Progress Notes (Signed)
Consult Note: Gyn-Onc   Kelly Mcclure 32 y.o. female  No chief complaint on file.  Interval History: Returns today as previously scheduled for followup of an abnormal Pap smear. Since her last visit, she has done well. She has regular cyclic menstrual periods. She has no intermenstrual spotting or pelvic pain. She's using oral contraceptives without difficulty.   HPI: Patient had an abnormal Pap smear in 2011. 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 Surgical margins were positive. Therefore, the patient underwent a cold knife conization on 04/01/2010. Pathology from that specimen showed severe dysplasia with negative margins. The patient was subsequently followed with Pap smears. A pap in March 2012 was ascus. ECC was negative. Repeat ECC in June 2012 was negative. Pap smear in 03/03/2012 showed high-grade SIL and again ECC was negative. Conservative followup was recommended. Repeat colposcopy in December 2013 identified a lesion in the posterior vaginal fornix that was biopsied.  Final pathology only showed atypia.  A pap smear on that same day only showed LSIL.      Review of Systems:10 point review of systems is negative as noted above.   Vitals: There were no vitals taken for this visit.  Physical Exam: General : The patient is a healthy woman in no acute distress.  HEENT: normocephalic, extraoccular movements normal; neck is supple without thyromegally  Lynphnodes: Supraclavicular and inguinal nodes not enlarged  Abdomen: Soft, non-tender, no ascites, no organomegally, no masses, no hernias  Pelvic:  EGBUS: Normal female  Vagina: Normal, no lesions  Urethra and Bladder: Normal, non-tender  Cervix: Flush with the vaginal apex. Cervical os is stenotic  Uterus: Anterior normal shape size and consistency Bi-manual examination: Non-tender; no adenxal masses or nodularity  Rectal: Deferred  Lower extremities: No  edema or varicosities. Normal range of motion    Assessment/Plan: Microinvasive squamous cell carcinoma the cervix. Recent Pap smear showing LG SIL. Pap smears repeated today. If this is normal or shows LGSIL will have a repeat a Pap smear in 6 months.  Allergies  Allergen Reactions  . Relpax (Eletriptan Hydrobromide)     Stroke-like symptoms  . Ultram (Tramadol Hcl)     Seizure    Past Medical History  Diagnosis Date  . 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 Seizure disorder     Diagnosed at age 57  . Migraines   . Nephrolithiasis 08/2009  . 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 H/O radioactive iodine thyroid ablation 12/2009    Past Surgical History  Procedure Laterality Date  . Leep  02/25/2010  . Dilation and curettage of uterus  2009  . Tonsillectomy  1995  . Adenoidectomy  1995  . Nasal septum surgery  2006    Repair of deviated septum  . Leep  07/25/11    Current Outpatient Prescriptions  Medication Sig Dispense Refill  . aspirin-acetaminophen-caffeine (EXCEDRIN MIGRAINE) 250-250-65 MG per tablet Take 1 tablet by mouth as needed.      Marland Kitchen atenolol (TENORMIN) 25 MG tablet Take 25 mg by mouth daily.      . butalbital-acetaminophen-caffeine (FIORICET, ESGIC) 50-325-40 MG per tablet Take 1 tablet by mouth as needed. Once every other week or once weekly      . hyoscyamine (LEVSIN, ANASPAZ) 0.125 MG tablet Take 0.125 mg by mouth as needed.      . lamoTRIgine (LAMICTAL) 100 MG tablet Take 100 mg by mouth 2 (two)  times daily.      . LevETIRAcetam (KEPPRA XR PO) Take 500 mg by mouth 5 (five) times daily.       Marland Kitchen levothyroxine (SYNTHROID, LEVOTHROID) 100 MCG tablet Take 100 mcg by mouth daily.      . Multiple Vitamins-Minerals (MULTIVITAMIN GUMMIES ADULT PO) Take by mouth daily.      . naproxen sodium (ANAPROX) 220 MG tablet Take 220 mg by mouth as needed.      . Norgestimate-Ethinyl Estradiol Triphasic (TRI-SPRINTEC) 0.18/0.215/0.25  MG-35 MCG tablet Take 1 tablet by mouth daily.      . phentermine 37.5 MG capsule Take 37.5 mg by mouth every morning.      . Probiotic Product (ALIGN PO) Take 1 capsule by mouth daily.       No current facility-administered medications for this visit.    History   Social History  . Marital Status: Single    Spouse Name: N/A    Number of Children: N/A  . Years of Education: N/A   Occupational History  . Not on file.   Social History Main Topics  . Smoking status: Never Smoker   . Smokeless tobacco: Not on file  . Alcohol Use: Yes     Comment: Rare  . Drug Use:   . Sexually Active:    Other Topics Concern  . Not on file   Social History Narrative  . No narrative on file    Family History  Problem Relation Age of Onset  . Cervical cancer Mother   . Diabetes Mother   . Skin cancer Mother   . Hypertension Father   . Leukemia Other   . Diabetes Other   . Skin cancer Maternal Aunt       Jeannette Corpus, MD 10/01/2012, 7:21 AM and an and in the

## 2012-10-01 NOTE — Patient Instructions (Addendum)
We will check to the Pap smear results.

## 2012-11-01 ENCOUNTER — Ambulatory Visit: Payer: BC Managed Care – PPO | Attending: Gynecology | Admitting: Gynecology

## 2012-11-01 ENCOUNTER — Other Ambulatory Visit (HOSPITAL_COMMUNITY)
Admission: RE | Admit: 2012-11-01 | Discharge: 2012-11-01 | Disposition: A | Discharge status: Home / Self Care | Payer: BC Managed Care – PPO | Source: Ambulatory Visit | Attending: Gynecology | Admitting: Gynecology

## 2012-11-01 ENCOUNTER — Encounter: Payer: Self-pay | Admitting: Gynecology

## 2012-11-01 VITALS — BP 106/60 | HR 60 | Temp 97.8°F | Resp 18 | Ht 65.0 in | Wt 148.0 lb

## 2012-11-01 DIAGNOSIS — C539 Malignant neoplasm of cervix uteri, unspecified: Secondary | ICD-10-CM | POA: Insufficient documentation

## 2012-11-01 DIAGNOSIS — Z01419 Encounter for gynecological examination (general) (routine) without abnormal findings: Secondary | ICD-10-CM | POA: Insufficient documentation

## 2012-11-01 NOTE — Progress Notes (Signed)
Consult Note: Gyn-Onc   Kelly Mcclure 32 y.o. female  Chief Complaint  Patient presents with  . f/u cervical ca    Assessment : Recent Pap smear showing high-grade SIL. I am unable see any abnormality in the cervix or vagina. The cervical os is stenotic and am unable to perform an endocervical curettage.  Plan: We will await for the Pap smear report. I think is most likely we will need to go to the operating room to dilate the cervix and endocervical curettage. We'll contact the patient with a past report and make plans thereafter.   Interval History: Returns today for further followup and colposcopic examination. At her visit 10/01/2012 Pap smears obtained showing high-grade squamous intraepithelial lesion. Since her last visit, she has done well. She has regular cyclic menstrual periods. She has no intermenstrual spotting or pelvic pain. She's using oral contraceptives without difficulty.   HPI: Patient had an abnormal Pap smear in 2011. 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 Surgical margins were positive. Therefore, the patient underwent a cold knife conization on 04/01/2010. Pathology from that specimen showed severe dysplasia with negative margins. The patient was subsequently followed with Pap smears. A pap in March 2012 was ascus. ECC was negative. Repeat ECC in June 2012 was negative. Pap smear in 03/03/2012 showed high-grade SIL and again ECC was negative. Conservative followup was recommended.  Repeat colposcopy in December 2013 identified a lesion in the posterior vaginal fornix that was biopsied. Final pathology only showed atypia. A pap smear on that same day only showed LSIL.  A Pap smear on 10/01/2012 showed high-grade SIL.  Procedure note: Colposcopic examination is performed of the cervix and entire vagina. 3% acetic acid was used throughout. With a negative colposcopic examination there after applying Lugol  solution and no lesions are noted. I attempted to obtain a brush sampling from the endocervical canal although the cervix is so stenotic I am unable to introduce a brush into the canal.   Review of Systems:10 point review of systems is negative except as noted in interval history.   Vitals: Blood pressure 106/60, pulse 60, temperature 97.8 F (36.6 C), temperature source Oral, resp. rate 18, height 5\' 5"  (1.651 m), weight 148 lb (67.132 kg).  Physical Exam: General : The patient is a healthy woman in no acute distress.  HEENT: normocephalic, extraoccular movements normal; neck is supple without thyromegally  Lynphnodes: Supraclavicular and inguinal nodes not enlarged  Abdomen: Soft, non-tender, no ascites, no organomegally, no masses, no hernias  Pelvic:  EGBUS: Normal female  Vagina: Normal, no lesions  Urethra and Bladder: Normal, non-tender  Cervix and Uterus: The cervix appears normal but the os is stenotic and is not even admit a endocervical brush.  Bi-manual examination: Non-tender; no adenxal masses or nodularity  Rectal: normal sphincter tone, no masses, no blood  Lower extremities: No edema or varicosities. Normal range of motion      Allergies  Allergen Reactions  . Relpax (Eletriptan Hydrobromide)     Stroke-like symptoms  . Ultram (Tramadol Hcl)     Seizure    Past Medical History  Diagnosis Date  . 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 Seizure disorder     Diagnosed at age 65  . Migraines   . Nephrolithiasis 08/2009  . 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 H/O radioactive iodine thyroid ablation 12/2009    Past Surgical  History  Procedure Laterality Date  . Leep  02/25/2010  . Dilation and curettage of uterus  2009  . Tonsillectomy  1995  . Adenoidectomy  1995  . Nasal septum surgery  2006    Repair of deviated septum  . Leep  07/25/11    Current Outpatient Prescriptions  Medication Sig Dispense Refill  .  aspirin-acetaminophen-caffeine (EXCEDRIN MIGRAINE) 250-250-65 MG per tablet Take 1 tablet by mouth as needed.      . butalbital-acetaminophen-caffeine (FIORICET, ESGIC) 50-325-40 MG per tablet Take 1 tablet by mouth as needed. Once every other week or once weekly      . hyoscyamine (LEVSIN, ANASPAZ) 0.125 MG tablet Take 0.125 mg by mouth as needed.      . lamoTRIgine (LAMICTAL) 100 MG tablet Take 100 mg by mouth 2 (two) times daily.      . LevETIRAcetam (KEPPRA XR PO) Take 500 mg by mouth 5 (five) times daily.       Marland Kitchen levothyroxine (SYNTHROID, LEVOTHROID) 100 MCG tablet Take 112 mcg by mouth daily.       . Multiple Vitamins-Minerals (MULTIVITAMIN GUMMIES ADULT PO) Take by mouth daily.      . naproxen sodium (ANAPROX) 220 MG tablet Take 220 mg by mouth as needed.      . Norgestimate-Ethinyl Estradiol Triphasic (TRI-SPRINTEC) 0.18/0.215/0.25 MG-35 MCG tablet Take 1 tablet by mouth daily.      . phentermine 37.5 MG capsule Take 37.5 mg by mouth every morning.      . Probiotic Product (ALIGN PO) Take 1 capsule by mouth daily.      Marland Kitchen atenolol (TENORMIN) 25 MG tablet Take 25 mg by mouth daily.       No current facility-administered medications for this visit.    History   Social History  . Marital Status: Single    Spouse Name: N/A    Number of Children: N/A  . Years of Education: N/A   Occupational History  . Not on file.   Social History Main Topics  . Smoking status: Never Smoker   . Smokeless tobacco: Not on file  . Alcohol Use: Yes     Comment: Rare  . Drug Use:   . Sexually Active:    Other Topics Concern  . Not on file   Social History Narrative  . No narrative on file    Family History  Problem Relation Age of Onset  . Cervical cancer Mother   . Diabetes Mother   . Skin cancer Mother   . Hypertension Father   . Leukemia Other   . Diabetes Other   . Skin cancer Maternal Aunt       Jeannette Corpus, MD 11/01/2012, 2:16 PM

## 2012-11-01 NOTE — Patient Instructions (Signed)
We will contact you if the Pap smear report.

## 2012-11-01 NOTE — Addendum Note (Signed)
Addended by: De Blanch on: 11/01/2012 03:35 PM   Modules accepted: Orders

## 2012-11-09 ENCOUNTER — Telehealth: Payer: Self-pay | Admitting: *Deleted

## 2012-11-09 NOTE — Telephone Encounter (Signed)
Message left for pt with PAP results  

## 2013-02-07 ENCOUNTER — Ambulatory Visit: Payer: BC Managed Care – PPO | Admitting: Gynecology

## 2013-02-16 ENCOUNTER — Encounter (HOSPITAL_COMMUNITY): Payer: Self-pay | Admitting: Pharmacy Technician

## 2013-02-21 ENCOUNTER — Encounter (HOSPITAL_COMMUNITY): Payer: Self-pay

## 2013-02-21 ENCOUNTER — Encounter (HOSPITAL_COMMUNITY)
Admission: RE | Admit: 2013-02-21 | Discharge: 2013-02-21 | Disposition: A | Discharge status: Home / Self Care | Payer: BC Managed Care – PPO | Source: Ambulatory Visit | Attending: Gynecology | Admitting: Gynecology

## 2013-02-21 DIAGNOSIS — C539 Malignant neoplasm of cervix uteri, unspecified: Secondary | ICD-10-CM | POA: Insufficient documentation

## 2013-02-21 DIAGNOSIS — Z01812 Encounter for preprocedural laboratory examination: Secondary | ICD-10-CM | POA: Insufficient documentation

## 2013-02-21 HISTORY — DX: Personal history of urinary calculi: Z87.442

## 2013-02-21 HISTORY — DX: Malignant (primary) neoplasm, unspecified: C80.1

## 2013-02-21 LAB — BASIC METABOLIC PANEL
CO2: 24 mEq/L (ref 19–32)
Calcium: 9.7 mg/dL (ref 8.4–10.5)
Chloride: 101 mEq/L (ref 96–112)
Glucose, Bld: 86 mg/dL (ref 70–99)
Sodium: 136 mEq/L (ref 135–145)

## 2013-02-21 LAB — CBC WITH DIFFERENTIAL/PLATELET
Eosinophils Absolute: 0.2 10*3/uL (ref 0.0–0.7)
Eosinophils Relative: 1 % (ref 0–5)
HCT: 44.7 % (ref 36.0–46.0)
Hemoglobin: 14.5 g/dL (ref 12.0–15.0)
Lymphocytes Relative: 45 % (ref 12–46)
Lymphs Abs: 5.6 10*3/uL — ABNORMAL HIGH (ref 0.7–4.0)
MCH: 26 pg (ref 26.0–34.0)
MCV: 80.1 fL (ref 78.0–100.0)
Monocytes Absolute: 0.8 10*3/uL (ref 0.1–1.0)
Monocytes Relative: 6 % (ref 3–12)
Platelets: 336 10*3/uL (ref 150–400)
RBC: 5.58 MIL/uL — ABNORMAL HIGH (ref 3.87–5.11)

## 2013-02-21 NOTE — Patient Instructions (Signed)
YOUR SURGERY IS SCHEDULED AT [facility]  ON:  Tuesday 7/29  REPORT TO [place] [facility] AT:  5:15 AM      PHONE # FOR SHORT STAY IS [phone]       STOP PHENTERMINE AT LEAST 3 DAYS BEFORE SURGERY.  DO NOT EAT OR DRINK ANYTHING AFTER MIDNIGHT THE NIGHT BEFORE YOUR SURGERY.  YOU MAY BRUSH YOUR TEETH, RINSE OUT YOUR MOUTH--BUT NO WATER, NO FOOD, NO CHEWING GUM, NO MINTS, NO CANDIES, NO CHEWING TOBACCO.  PLEASE TAKE THE FOLLOWING MEDICATIONS THE AM OF YOUR SURGERY WITH A FEW SIPS OF WATER:  LAMOTRIGINE (LAMICTAL), KEPPRA, SYNTHROID, TRI-SRINTEC.    DO NOT BRING VALUABLES, MONEY, CREDIT CARDS.  DO NOT WEAR JEWELRY, MAKE-UP, NAIL POLISH AND NO METAL PINS OR CLIPS IN YOUR HAIR. CONTACT LENS, DENTURES / PARTIALS, GLASSES SHOULD NOT BE WORN TO SURGERY AND IN MOST CASES-HEARING AIDS WILL NEED TO BE REMOVED.  BRING YOUR GLASSES CASE, ANY EQUIPMENT NEEDED FOR YOUR CONTACT LENS. FOR PATIENTS ADMITTED TO THE HOSPITAL--CHECK OUT TIME THE DAY OF DISCHARGE IS 11:00 AM.  ALL INPATIENT ROOMS ARE PRIVATE - WITH BATHROOM, TELEPHONE, TELEVISION AND WIFI INTERNET.  IF YOU ARE BEING DISCHARGED THE SAME DAY OF YOUR SURGERY--YOU CAN NOT DRIVE YOURSELF HOME--AND SHOULD NOT GO HOME ALONE BY TAXI OR BUS.  NO DRIVING OR OPERATING MACHINERY FOR 24 HOURS FOLLOWING ANESTHESIA / PAIN MEDICATIONS.  PLEASE MAKE ARRANGEMENTS FOR SOMEONE TO BE WITH YOU AT HOME THE FIRST 24 HOURS AFTER SURGERY. RESPONSIBLE DRIVER'S NAME michael hernandez- fiance                                               PHONE #     [phone]                           PLEASE READ OVER ANY  FACT SHEETS THAT YOU WERE GIVEN: , INCENTIVE SPIROMETER INFORMATION. FAILURE TO FOLLOW THESE INSTRUCTIONS MAY RESULT IN THE CANCELLATION OF YOUR SURGERY.   PATIENT SIGNATURE_________________________________

## 2013-02-21 NOTE — Pre-Procedure Instructions (Addendum)
EKG AND CXR NOT NEEDED PER ANESTHESIOLOGIST'S GUIDELINES. SPOKE WITH MELISSA CROSS, NP FOR DR. CLARKE-PEARSON - SHE STATES PT DOES NOT HAVE TO DO CLEAR LIQUID DIET DAY BEFORE HER SURGERY AS IS ORDERED.

## 2013-02-28 ENCOUNTER — Telehealth: Payer: Self-pay | Admitting: Gynecologic Oncology

## 2013-02-28 NOTE — Telephone Encounter (Signed)
Telephone call to check on pre-operative status.  Patient complaint with pre-operative instructions.  Reinforced NPO after midnight.  No questions or concerns voiced.  Instructed to call for any needs. 

## 2013-02-28 NOTE — Anesthesia Preprocedure Evaluation (Addendum)
Anesthesia Evaluation  Patient identified by MRN, date of birth, ID band Patient awake    Reviewed: Allergy & Precautions, H&P , NPO status , Patient's Chart, lab work & pertinent test results  Airway Mallampati: III TM Distance: >3 FB Neck ROM: Full    Dental  (+) Teeth Intact and Dental Advisory Given   Pulmonary neg pulmonary ROS,  breath sounds clear to auscultation  Pulmonary exam normal       Cardiovascular negative cardio ROS  Rate:Normal     Neuro/Psych  Headaches, Seizures -,  negative psych ROS   GI/Hepatic negative GI ROS, Neg liver ROS,   Endo/Other  Hypothyroidism   Renal/GU negative Renal ROS  negative genitourinary   Musculoskeletal negative musculoskeletal ROS (+)   Abdominal   Peds  Hematology negative hematology ROS (+)   Anesthesia Other Findings   Reproductive/Obstetrics negative OB ROS                          Anesthesia Physical Anesthesia Plan  ASA: III  Anesthesia Plan: General   Post-op Pain Management:    Induction: Intravenous  Airway Management Planned: LMA  Additional Equipment:   Intra-op Plan:   Post-operative Plan: Extubation in OR  Informed Consent: I have reviewed the patients History and Physical, chart, labs and discussed the procedure including the risks, benefits and alternatives for the proposed anesthesia with the patient or authorized representative who has indicated his/her understanding and acceptance.   Dental advisory given  Plan Discussed with: CRNA  Anesthesia Plan Comments:         Anesthesia Quick Evaluation

## 2013-03-01 ENCOUNTER — Ambulatory Visit (HOSPITAL_COMMUNITY)
Admission: RE | Admit: 2013-03-01 | Discharge: 2013-03-01 | Disposition: A | Discharge status: Home / Self Care | Payer: BC Managed Care – PPO | Source: Ambulatory Visit | Attending: Gynecology | Admitting: Gynecology

## 2013-03-01 ENCOUNTER — Ambulatory Visit (HOSPITAL_COMMUNITY): Payer: BC Managed Care – PPO | Admitting: Anesthesiology

## 2013-03-01 ENCOUNTER — Encounter (HOSPITAL_COMMUNITY): Payer: Self-pay | Admitting: *Deleted

## 2013-03-01 ENCOUNTER — Encounter (HOSPITAL_COMMUNITY)
Admission: RE | Disposition: A | Discharge status: Home / Self Care | Payer: Self-pay | Source: Ambulatory Visit | Attending: Gynecology

## 2013-03-01 ENCOUNTER — Encounter (HOSPITAL_COMMUNITY): Payer: Self-pay | Admitting: Anesthesiology

## 2013-03-01 DIAGNOSIS — G40909 Epilepsy, unspecified, not intractable, without status epilepticus: Secondary | ICD-10-CM | POA: Insufficient documentation

## 2013-03-01 DIAGNOSIS — IMO0002 Reserved for concepts with insufficient information to code with codable children: Secondary | ICD-10-CM

## 2013-03-01 DIAGNOSIS — Z79899 Other long term (current) drug therapy: Secondary | ICD-10-CM | POA: Insufficient documentation

## 2013-03-01 DIAGNOSIS — N882 Stricture and stenosis of cervix uteri: Secondary | ICD-10-CM | POA: Insufficient documentation

## 2013-03-01 DIAGNOSIS — R87613 High grade squamous intraepithelial lesion on cytologic smear of cervix (HGSIL): Secondary | ICD-10-CM | POA: Insufficient documentation

## 2013-03-01 DIAGNOSIS — E039 Hypothyroidism, unspecified: Secondary | ICD-10-CM | POA: Insufficient documentation

## 2013-03-01 HISTORY — PX: DILATION AND CURETTAGE OF UTERUS: SHX78

## 2013-03-01 SURGERY — DILATION AND CURETTAGE
Anesthesia: General | Wound class: Clean Contaminated

## 2013-03-01 MED ORDER — KETOROLAC TROMETHAMINE 30 MG/ML IJ SOLN
INTRAMUSCULAR | Status: AC
  Filled 2013-03-01: qty 1

## 2013-03-01 MED ORDER — LACTATED RINGERS IV SOLN: INTRAVENOUS | Status: DC

## 2013-03-01 MED ORDER — SODIUM CHLORIDE 0.9 % IV SOLN: 250.0000 mL | INTRAVENOUS | Status: DC | PRN

## 2013-03-01 MED ORDER — IODINE STRONG (LUGOLS) 5 % PO SOLN
ORAL | Status: AC
  Filled 2013-03-01: qty 1

## 2013-03-01 MED ORDER — FENTANYL CITRATE 0.05 MG/ML IJ SOLN
INTRAMUSCULAR | Status: DC | PRN
  Administered 2013-03-01: 50 ug via INTRAVENOUS
  Administered 2013-03-01: 25 ug via INTRAVENOUS

## 2013-03-01 MED ORDER — 0.9 % SODIUM CHLORIDE (POUR BTL) OPTIME
TOPICAL | Status: DC | PRN
  Administered 2013-03-01: 1000 mL

## 2013-03-01 MED ORDER — PROMETHAZINE HCL 25 MG/ML IJ SOLN: 6.2500 mg | INTRAMUSCULAR | Status: DC | PRN

## 2013-03-01 MED ORDER — HYDROMORPHONE HCL PF 1 MG/ML IJ SOLN
0.2500 mg | INTRAMUSCULAR | Status: DC | PRN
  Administered 2013-03-01 (×4): 0.5 mg via INTRAVENOUS

## 2013-03-01 MED ORDER — HYDROMORPHONE HCL PF 1 MG/ML IJ SOLN
INTRAMUSCULAR | Status: AC
  Filled 2013-03-01: qty 1

## 2013-03-01 MED ORDER — IODINE STRONG (LUGOLS) 5 % PO SOLN
ORAL | Status: DC | PRN
  Administered 2013-03-01: 14 mL via ORAL

## 2013-03-01 MED ORDER — OXYCODONE HCL 5 MG PO TABS: 5.0000 mg | ORAL_TABLET | ORAL | Status: DC | PRN

## 2013-03-01 MED ORDER — KETOROLAC TROMETHAMINE 30 MG/ML IJ SOLN
30.0000 mg | Freq: Once | INTRAMUSCULAR | Status: AC
  Administered 2013-03-01: 30 mg via INTRAVENOUS

## 2013-03-01 MED ORDER — PROPOFOL 10 MG/ML IV BOLUS
INTRAVENOUS | Status: DC | PRN
  Administered 2013-03-01: 140 mg via INTRAVENOUS

## 2013-03-01 MED ORDER — LACTATED RINGERS IV SOLN
INTRAVENOUS | Status: DC | PRN
  Administered 2013-03-01: 07:00:00 via INTRAVENOUS

## 2013-03-01 MED ORDER — ACETAMINOPHEN 325 MG PO TABS: 650.0000 mg | ORAL_TABLET | ORAL | Status: DC | PRN

## 2013-03-01 MED ORDER — SODIUM CHLORIDE 0.9 % IJ SOLN: 3.0000 mL | INTRAMUSCULAR | Status: DC | PRN

## 2013-03-01 MED ORDER — ACETAMINOPHEN 650 MG RE SUPP
650.0000 mg | RECTAL | Status: DC | PRN
  Filled 2013-03-01: qty 1

## 2013-03-01 MED ORDER — SODIUM CHLORIDE 0.9 % IJ SOLN: 3.0000 mL | Freq: Two times a day (BID) | INTRAMUSCULAR | Status: DC

## 2013-03-01 MED ORDER — ONDANSETRON HCL 4 MG/2ML IJ SOLN: 4.0000 mg | Freq: Four times a day (QID) | INTRAMUSCULAR | Status: DC | PRN

## 2013-03-01 MED ORDER — MIDAZOLAM HCL 5 MG/5ML IJ SOLN
INTRAMUSCULAR | Status: DC | PRN
  Administered 2013-03-01: 2 mg via INTRAVENOUS

## 2013-03-01 SURGICAL SUPPLY — 13 items
CATH ROBINSON RED A/P 16FR (CATHETERS) ×1 IMPLANT
CLOTH BEACON ORANGE TIMEOUT ST (SAFETY) ×2 IMPLANT
COVER SURGICAL LIGHT HANDLE (MISCELLANEOUS) ×1 IMPLANT
DILATOR CAVERNOTOME DISP (INSTRUMENTS) ×2 IMPLANT
DRESSING TELFA 8X3 (GAUZE/BANDAGES/DRESSINGS) ×2 IMPLANT
GLOVE BIO SURGEON STRL SZ7.5 (GLOVE) ×4 IMPLANT
GOWN STRL NON-REIN LRG LVL3 (GOWN DISPOSABLE) ×2 IMPLANT
GOWN STRL REIN XL XLG (GOWN DISPOSABLE) ×2 IMPLANT
NS IRRIG 1000ML POUR BTL (IV SOLUTION) ×1 IMPLANT
PACK MINOR VAGINAL W LONG (CUSTOM PROCEDURE TRAY) ×2 IMPLANT
TOWEL OR 17X26 10 PK STRL BLUE (TOWEL DISPOSABLE) ×2 IMPLANT
UNDERPAD 30X30 INCONTINENT (UNDERPADS AND DIAPERS) ×2 IMPLANT
WATER STERILE IRR 1500ML POUR (IV SOLUTION) ×1 IMPLANT

## 2013-03-01 NOTE — Op Note (Signed)
Kelly Mcclure  female MEDICAL RECORD ZO:[id number] DATE OF BIRTH: 03-16-1981 PHYSICIAN: De Blanch, M.D  03/01/2013   OPERATIVE REPORT  PREOPERATIVE DIAGNOSIS: High-grade dysplasia on Pap smear  POSTOPERATIVE DIAGNOSIS: Same  PROCEDURE: Exam under anesthesia, endocervical curettage, vaginal biopsies  SURGEON: De Blanch, M.D ANESTHESIA: LMA ESTIMATED BLOOD LOSS: Minimal  SURGICAL FINDINGS: Examination under anesthesia revealed a cervix which was flush with the vaginal fornices. The cervical canal was stenotic but once entered and dilated the uterus sounded approximate 7 cm anteriorly. Apply Lugol solution to the cervix showed no lesions. There were 2 areas of non-uptake of Lugol's in the posterior vagina which were biopsied.  PROCEDURE: The patient was brought to the operating room and after satisfactory attainment of general anesthesia using LMA was placed in lithotomy position in [place] stirrups. The perineum and vagina were prepped and the bladder emptied with a straight catheter. The patient was draped. Surgical timeout was taken. Weighted speculum was placed in posterior vagina and the cervix grasped with a single-tooth tenaculum. Using a narrow dilator was able to enter the cervical canal and then dilate the cervix with a os finder. The uterus was sounded. Endocervical curettage was performed and submitted to pathology.  Lugol solution was placed on the cervix and vagina. There were 2 areas of non-uptake in the posterior vagina. These were biopsied. Hemostasis achieved with cautery.  The patient was awakened from anesthesia and taken to the recovery room in satisfactory condition. Sponge, needle, and history counts correct x2.   De Blanch, M.D

## 2013-03-01 NOTE — Progress Notes (Signed)
Pt arrived from pacu c no peripads.  No vaginal bleeding noted. Pt dc to home

## 2013-03-01 NOTE — Interval H&P Note (Signed)
History and Physical Interval Note:  03/01/2013 7:16 AM  Kelly Mcclure  has presented today for surgery, with the diagnosis of Cervical Cancer  The various methods of treatment have been discussed with the patient and family. After consideration of risks, benefits and other options for treatment, the patient has consented to  Procedure(s): CERVICAL DILATATION AND ENDOCERVICAL CURRETAGE (N/A) as a surgical intervention .  The patient's history has been reviewed, patient examined, no change in status, stable for surgery.  I have reviewed the patient's chart and labs.  Questions were answered to the patient's satisfaction.     CLARKE-PEARSON,Kiyaan Haq L

## 2013-03-01 NOTE — H&P (Signed)
Kelly Mcclure 32 y.o. female  Chief Complaint   Patient presents with   .  f/u cervical ca   Assessment : Recent Pap smear showing high-grade SIL. I am unable see any abnormality in the cervix or vagina. The cervical os is stenotic and am unable to perform an endocervical curettage. Therefore, we will dilate the cervix in the OR under anesthesia and evaluate the vagina as well.  Risks of procedure are discussed and all questions are answered.     Interval History: Returns today for further followup and colposcopic examination. At her visit 10/01/2012 Pap smears obtained showing high-grade squamous intraepithelial lesion. Since her last visit, she has done well. She has regular cyclic menstrual periods. She has no intermenstrual spotting or pelvic pain. She's using oral contraceptives without difficulty.  HPI: Patient had an abnormal Pap smear in 2011. 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 Surgical margins were positive. Therefore, the patient underwent a cold knife conization on 04/01/2010. Pathology from that specimen showed severe dysplasia with negative margins. The patient was subsequently followed with Pap smears. A pap in March 2012 was ascus. ECC was negative. Repeat ECC in June 2012 was negative. Pap smear in 03/03/2012 showed high-grade SIL and again ECC was negative. Conservative followup was recommended.  Repeat colposcopy in December 2013 identified a lesion in the posterior vaginal fornix that was biopsied. Final pathology only showed atypia. A pap smear on that same day only showed LSIL.  A Pap smear on 10/01/2012 showed high-grade SIL.  Procedure note: Colposcopic examination is performed of the cervix and entire vagina. 3% acetic acid was used throughout. With a negative colposcopic examination there after applying Lugol solution and no lesions are noted. I attempted to obtain a brush sampling from the endocervical  canal although the cervix is so stenotic I am unable to introduce a brush into the canal.  Review of Systems:10 point review of systems is negative except as noted in interval history.  Vitals: Blood pressure 106/60, pulse 60, temperature 97.8 F (36.6 C), temperature source Oral, resp. rate 18, height 5\' 5"  (1.651 m), weight 148 lb (67.132 kg).  Physical Exam:  General : The patient is a healthy woman in no acute distress.  HEENT: normocephalic, extraoccular movements normal; neck is supple without thyromegally  Lynphnodes: Supraclavicular and inguinal nodes not enlarged  Abdomen: Soft, non-tender, no ascites, no organomegally, no masses, no hernias  Pelvic:  EGBUS: Normal female  Vagina: Normal, no lesions  Urethra and Bladder: Normal, non-tender  Cervix and Uterus: The cervix appears normal but the os is stenotic and is not even admit a endocervical brush.  Bi-manual examination: Non-tender; no adenxal masses or nodularity  Rectal: normal sphincter tone, no masses, no blood  Lower extremities: No edema or varicosities. Normal range of motion  Allergies   Allergen  Reactions   .  Relpax (Eletriptan Hydrobromide)      Stroke-like symptoms   .  Ultram (Tramadol Hcl)      Seizure    Past Medical History   Diagnosis  Date   .  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  Seizure disorder      Diagnosed at age 66   .  Migraines    .  Nephrolithiasis  08/2009   .  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  H/O radioactive iodine thyroid ablation  12/2009  Past Surgical History   Procedure  Laterality  Date   .  Leep   02/25/2010   .  Dilation and curettage of uterus   2009   .  Tonsillectomy   1995   .  Adenoidectomy   1995   .  Nasal septum surgery   2006     Repair of deviated septum   .  Leep   07/25/11    Current Outpatient Prescriptions   Medication  Sig  Dispense  Refill   .  aspirin-acetaminophen-caffeine (EXCEDRIN MIGRAINE)  250-250-65 MG per tablet  Take 1 tablet by mouth as needed.     .  butalbital-acetaminophen-caffeine (FIORICET, ESGIC) 50-325-40 MG per tablet  Take 1 tablet by mouth as needed. Once every other week or once weekly     .  hyoscyamine (LEVSIN, ANASPAZ) 0.125 MG tablet  Take 0.125 mg by mouth as needed.     .  lamoTRIgine (LAMICTAL) 100 MG tablet  Take 100 mg by mouth 2 (two) times daily.     .  LevETIRAcetam (KEPPRA XR PO)  Take 500 mg by mouth 5 (five) times daily.     Marland Kitchen  levothyroxine (SYNTHROID, LEVOTHROID) 100 MCG tablet  Take 112 mcg by mouth daily.     .  Multiple Vitamins-Minerals (MULTIVITAMIN GUMMIES ADULT PO)  Take by mouth daily.     .  naproxen sodium (ANAPROX) 220 MG tablet  Take 220 mg by mouth as needed.     .  Norgestimate-Ethinyl Estradiol Triphasic (TRI-SPRINTEC) 0.18/0.215/0.25 MG-35 MCG tablet  Take 1 tablet by mouth daily.     .  phentermine 37.5 MG capsule  Take 37.5 mg by mouth every morning.     .  Probiotic Product (ALIGN PO)  Take 1 capsule by mouth daily.     Marland Kitchen  atenolol (TENORMIN) 25 MG tablet  Take 25 mg by mouth daily.      No current facility-administered medications for this visit.    History    Social History   .  Marital Status:  Single     Spouse Name:  N/A     Number of Children:  N/A   .  Years of Education:  N/A    Occupational History   .  Not on file.    Social History Main Topics   .  Smoking status:  Never Smoker   .  Smokeless tobacco:  Not on file   .  Alcohol Use:  Yes      Comment: Rare   .  Drug Use:    .  Sexually Active:     Other Topics  Concern   .  Not on file    Social History Narrative   .  No narrative on file    Family History   Problem  Relation  Age of Onset   .  Cervical cancer  Mother    .  Diabetes  Mother    .  Skin cancer  Mother    .  Hypertension  Father    .  Leukemia  Other    .  Diabetes  Other    .  Skin cancer  Maternal Aunt    Jeannette Corpus, MD

## 2013-03-01 NOTE — Transfer of Care (Signed)
Immediate Anesthesia Transfer of Care Note  Patient: Kelly Mcclure  Procedure(s) Performed: Procedure(s): CERVICAL DILATATION AND ENDOCERVICAL CURRETAGE AND VAAGINAL BIOPIES (N/A)  Patient Location: PACU  Anesthesia Type:General  Level of Consciousness: awake, alert , sedated and patient cooperative  Airway & Oxygen Therapy: Patient Spontanous Breathing and Patient connected to face mask oxygen  Post-op Assessment: Report given to PACU RN and Post -op Vital signs reviewed and stable  Post vital signs: Reviewed and stable  Complications: No apparent anesthesia complications

## 2013-03-01 NOTE — Anesthesia Postprocedure Evaluation (Signed)
Anesthesia Post Note  Patient: Kelly Mcclure  Procedure(s) Performed: Procedure(s) (LRB): CERVICAL DILATATION AND ENDOCERVICAL CURRETAGE AND VAAGINAL BIOPIES (N/A)  Anesthesia type: General  Patient location: PACU  Post pain: Pain level controlled  Post assessment: Post-op Vital signs reviewed  Last Vitals:  Filed Vitals:   03/01/13 0939  BP: 137/87  Pulse: 76  Temp:   Resp: 17    Post vital signs: Reviewed  Level of consciousness: sedated  Complications: No apparent anesthesia complications

## 2013-03-02 ENCOUNTER — Encounter (HOSPITAL_COMMUNITY): Payer: Self-pay | Admitting: Gynecology

## 2013-03-04 ENCOUNTER — Telehealth: Payer: Self-pay | Admitting: Gynecologic Oncology

## 2013-03-04 NOTE — Telephone Encounter (Signed)
Patient informed of final pathology results from her procedure on July 29.  Advised that she would be contacted with further recommendations for follow up from Dr. Stanford Breed after he had reviewed the results.  Instructed to call for any questions or concerns until that time.

## 2013-03-07 ENCOUNTER — Telehealth: Payer: Self-pay | Admitting: Gynecologic Oncology

## 2013-03-07 NOTE — Telephone Encounter (Signed)
Patient notified of Dr. Nelwyn Salisbury recommendations for repeat pap smear in 3 months.  Verbalizing understanding.  No concerns voiced.

## 2013-03-08 ENCOUNTER — Telehealth: Payer: Self-pay | Admitting: Gynecologic Oncology

## 2013-03-08 NOTE — Telephone Encounter (Signed)
Message left with patient stating that she does not need to attend her scheduled appointment on August 29 with Dr. Stanford Breed.  He would like her to follow up in 3 months as scheduled for a repeat pap.  Advised to call for any questions or concerns.

## 2013-04-01 ENCOUNTER — Ambulatory Visit: Payer: BC Managed Care – PPO | Admitting: Gynecology

## 2013-05-27 ENCOUNTER — Encounter: Payer: Self-pay | Admitting: Gynecology

## 2013-05-27 ENCOUNTER — Ambulatory Visit: Payer: BC Managed Care – PPO | Attending: Gynecology | Admitting: Gynecology

## 2013-05-27 ENCOUNTER — Other Ambulatory Visit (HOSPITAL_COMMUNITY)
Admission: RE | Admit: 2013-05-27 | Discharge: 2013-05-27 | Disposition: A | Discharge status: Home / Self Care | Payer: BC Managed Care – PPO | Source: Ambulatory Visit | Attending: Gynecology | Admitting: Gynecology

## 2013-05-27 VITALS — BP 121/90 | HR 97 | Temp 98.1°F | Resp 18 | Ht 65.0 in | Wt 152.8 lb

## 2013-05-27 DIAGNOSIS — Z79899 Other long term (current) drug therapy: Secondary | ICD-10-CM | POA: Insufficient documentation

## 2013-05-27 DIAGNOSIS — Z01419 Encounter for gynecological examination (general) (routine) without abnormal findings: Secondary | ICD-10-CM | POA: Insufficient documentation

## 2013-05-27 DIAGNOSIS — E039 Hypothyroidism, unspecified: Secondary | ICD-10-CM | POA: Insufficient documentation

## 2013-05-27 DIAGNOSIS — G43909 Migraine, unspecified, not intractable, without status migrainosus: Secondary | ICD-10-CM | POA: Insufficient documentation

## 2013-05-27 DIAGNOSIS — IMO0002 Reserved for concepts with insufficient information to code with codable children: Secondary | ICD-10-CM

## 2013-05-27 DIAGNOSIS — R87612 Low grade squamous intraepithelial lesion on cytologic smear of cervix (LGSIL): Secondary | ICD-10-CM | POA: Insufficient documentation

## 2013-05-27 DIAGNOSIS — Z8541 Personal history of malignant neoplasm of cervix uteri: Secondary | ICD-10-CM | POA: Insufficient documentation

## 2013-05-27 DIAGNOSIS — G40309 Generalized idiopathic epilepsy and epileptic syndromes, not intractable, without status epilepticus: Secondary | ICD-10-CM | POA: Insufficient documentation

## 2013-05-27 NOTE — Progress Notes (Signed)
And Consult Note: Gyn-Onc   Kelly Mcclure 32 y.o. female  Chief Complaint  Patient presents with  . HGSIL    Follow up    Assessment :  Showing low-grade SIL and vaginal biopsy showed atypia..  Plan: We will await the Pap smear report today's visit. If it is normal her shows low-grade changes I recommend it be repeated again in 6 months. If there are high-grade changes then she will return for further evaluation and colposcopy. Going forward as long as Pap smears remain low grade or normal we've agreed that she'll have her past was performed in Dr. Jorene Minors  office..   Interval History: Returns today for further followup. In July she underwent examination under anesthesia, dilatation of cervix and endocervical curettage and vaginal biopsy. The only abnormality with some atypia on the vaginal biopsy. The patient had an uncomplicated postoperative course. Since her last visit, she has done well. She has regular cyclic menstrual periods. She has no intermenstrual spotting or pelvic pain. She's using oral contraceptives without difficulty.   HPI: Patient had an abnormal Pap smear in 2011. 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 Surgical margins were positive. Therefore, the patient underwent a cold knife conization on 04/01/2010. Pathology from that specimen showed severe dysplasia with negative margins. The patient was subsequently followed with Pap smears. A pap in March 2012 was ascus. ECC was negative. Repeat ECC in June 2012 was negative. Pap smear in 03/03/2012 showed high-grade SIL and again ECC was negative. Conservative followup was recommended.  Repeat colposcopy in December 2013 identified a lesion in the posterior vaginal fornix that was biopsied. Final pathology only showed atypia. A pap smear on that same day only showed LSIL.  A Pap smear on 10/01/2012 showed high-grade SIL.  Pap smear in March was low grade SIL.  Patient subsequently underwent cervical dilatation, endocervical curettage, and vaginal biopsy. The only abnormality found was atypia on vaginal biopsy.     Review of Systems:10 point review of systems is negative except as noted in interval history.   Vitals: Blood pressure 121/90, pulse 97, temperature 98.1 F (36.7 C), resp. rate 18, height 5\' 5"  (1.651 m), weight 152 lb 12.8 oz (69.31 kg).  Physical Exam: General : The patient is a healthy woman in no acute distress.  HEENT: normocephalic, extraoccular movements normal; neck is supple without thyromegally  Lynphnodes: Supraclavicular and inguinal nodes not enlarged  Abdomen: Soft, non-tender, no ascites, no organomegally, no masses, no hernias  Pelvic:  EGBUS: Normal female  Vagina: Normal, no lesions  Urethra and Bladder: Normal, non-tender  Cervix and Uterus: The cervix appears normal but the os is stenotic and is not even admit a endocervical brush. There is some menstrual blood in the vagina and cervix. Bi-manual examination: Non-tender; no adenxal masses or nodularity  Rectal: normal sphincter tone, no masses, no blood  Lower extremities: No edema or varicosities. Normal range of motion      Allergies  Allergen Reactions  . Sulfa Antibiotics     Tongue swelling, throat irritated  . Adhesive [Tape]     Some bandaids cause skin irritation  . Relpax [Eletriptan Hydrobromide]     Stroke-like symptoms  . Ultram [Tramadol Hcl]     Seizure    Past Medical History  Diagnosis Date  . 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 Seizure disorder     Diagnosed at age 65  . Migraines   .  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 H/O radioactive iodine thyroid ablation 12/2009  . Hypothyroidism   . History of kidney stones 2011  . Cancer     hx of cervical cancer  . Complication of anesthesia     after some surgeries - pt woke up with migraine  . Seizures     clonic tonic seizures -  will "space out " for a very brief moment.  take medication for seizures. HX OF MAJOR SEIZURES - LOOSING CONSCIOUSNESS-- APPROX 10 TO 12 YRS AGO- NEUROLOGIST IS DR. Kirtland Bouchard. [facility] [place]    Past Surgical History  Procedure Laterality Date  . Leep  02/25/2010  . Dilation and curettage of uterus  2009  . Tonsillectomy  1995  . Adenoidectomy  1995  . Nasal septum surgery  2006    Repair of deviated septum  . Leep  07/25/11  . Dilation and curettage of uterus N/A 03/01/2013    Procedure: CERVICAL DILATATION AND ENDOCERVICAL CURRETAGE AND VAAGINAL BIOPIES;  Surgeon: Jeannette Corpus, MD;  Location: [facility];  Service: Gynecology;  Laterality: N/A;    Current Outpatient Prescriptions  Medication Sig Dispense Refill  . aspirin-acetaminophen-caffeine (EXCEDRIN MIGRAINE) 250-250-65 MG per tablet Take 2 tablets by mouth every 6 (six) hours as needed for pain.       . butalbital-acetaminophen-caffeine (FIORICET, ESGIC) 50-325-40 MG per tablet Take 1 tablet by mouth 2 (two) times daily as needed for pain or headache.       . folic acid (FOLVITE) 1 MG tablet Take 1 mg by mouth daily.      Marland Kitchen lamoTRIgine (LAMICTAL) 150 MG tablet Take 150 mg by mouth 2 (two) times daily.      Marland Kitchen levETIRAcetam (KEPPRA) 500 MG tablet Take 500 mg by mouth 2 (two) times daily. 2 tabs in am, 3 tabs in pm      . levothyroxine (SYNTHROID, LEVOTHROID) 175 MCG tablet Take 175 mcg by mouth daily before breakfast. Brand name only - synthroid, Mon through [employer] Friday through [facility]      . Multiple Vitamin (MULTIVITAMIN WITH MINERALS) TABS Take 1 tablet by mouth daily.      . naproxen sodium (ANAPROX) 220 MG tablet Take 440 mg by mouth 2 (two) times daily as needed (for pain).       . Norgestimate-Ethinyl Estradiol Triphasic (TRI-SPRINTEC) 0.18/0.215/0.25 MG-35 MCG tablet Take 1 tablet by mouth every morning.       . Probiotic Product (TRUBIOTICS PO) Take 1 capsule by mouth daily.       No current  facility-administered medications for this visit.    History   Social History  . Marital Status: Single    Spouse Name: N/A    Number of Children: N/A  . Years of Education: N/A   Occupational History  . Not on file.   Social History Main Topics  . Smoking status: Never Smoker   . Smokeless tobacco: Never Used  . Alcohol Use: Yes     Comment: Rare  . Drug Use: No  . Sexual Activity: Not on file   Other Topics Concern  . Not on file   Social History Narrative  . No narrative on file    Family History  Problem Relation Age of Onset  . Cervical cancer Mother   . Diabetes Mother   . Skin cancer Mother   . Hypertension Father   . Leukemia Other   . Diabetes Other   .  Skin cancer Maternal Aunt       Jeannette Corpus, MD 05/27/2013, 8:28 AM

## 2013-05-27 NOTE — Addendum Note (Signed)
Addended by: Warner Mccreedy D on: 05/27/2013 11:09 AM   Modules accepted: Orders

## 2013-05-27 NOTE — Patient Instructions (Addendum)
We will contact you with the results of your pap smear.  

## 2013-06-01 ENCOUNTER — Telehealth: Payer: Self-pay | Admitting: Gynecologic Oncology

## 2013-06-01 NOTE — Telephone Encounter (Signed)
Message left informing patient of pap smear results and Dr. Nelwyn Salisbury recommendations for repeat pap in 6 months at Dr. Jorene Minors office.  Instructed to call the office for any questions or concerns.

## 2013-06-09 ENCOUNTER — Other Ambulatory Visit: Payer: Self-pay

## 2014-03-17 ENCOUNTER — Encounter: Payer: Self-pay | Admitting: Gynecology

## 2014-03-17 ENCOUNTER — Ambulatory Visit: Payer: BC Managed Care – PPO | Attending: Gynecology | Admitting: Gynecology

## 2014-03-17 VITALS — BP 142/98 | HR 91 | Temp 98.3°F | Resp 18 | Ht 65.0 in | Wt 150.4 lb

## 2014-03-17 DIAGNOSIS — G40909 Epilepsy, unspecified, not intractable, without status epilepticus: Secondary | ICD-10-CM | POA: Diagnosis not present

## 2014-03-17 DIAGNOSIS — Z7982 Long term (current) use of aspirin: Secondary | ICD-10-CM | POA: Insufficient documentation

## 2014-03-17 DIAGNOSIS — Z882 Allergy status to sulfonamides status: Secondary | ICD-10-CM | POA: Insufficient documentation

## 2014-03-17 DIAGNOSIS — C539 Malignant neoplasm of cervix uteri, unspecified: Secondary | ICD-10-CM

## 2014-03-17 DIAGNOSIS — Z888 Allergy status to other drugs, medicaments and biological substances status: Secondary | ICD-10-CM | POA: Diagnosis not present

## 2014-03-17 DIAGNOSIS — Z8541 Personal history of malignant neoplasm of cervix uteri: Secondary | ICD-10-CM | POA: Diagnosis not present

## 2014-03-17 DIAGNOSIS — Z79899 Other long term (current) drug therapy: Secondary | ICD-10-CM | POA: Insufficient documentation

## 2014-03-17 NOTE — Progress Notes (Signed)
Consult Note: Gyn-Onc   Kelly Mcclure 33 y.o. female  No chief complaint on file.   Assessment :  Stage IA 1 cervical cancer 2011. Recent Pap smear showing HGSIL and negative endocervical curettage. Colposcopy today is negative..  Plan: Given the disparity between the recent Pap smear and my negative colposcopy of the cervix and entire vagina, it is possible that the Pap smears been over interpreted. Therefore, I recommend that we repeat a Pap smear and submitted to the cytopathology lab at [facility]. We will repeat a Pap smear in about 4 weeks.   Interval History: Returns today for further followup. She recently saw Dr. Ronita Hipps who performed an endocervical curettage and Pap smear. The endocervical curettage was negative although the Pap smear showed HGSIL. The patient continues to have regular cyclic menses. She denies any intermenstrual spotting or pelvic pain.  HPI: Patient had an abnormal Pap smear in 2011. 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 Surgical margins were positive. Therefore, the patient underwent a cold knife conization on 04/01/2010. Pathology from that specimen showed severe dysplasia with negative margins. The patient was subsequently followed with Pap smears. A pap in March 2012 was ascus. ECC was negative. Repeat ECC in June 2012 was negative. Pap smear in 03/03/2012 showed high-grade SIL and again ECC was negative. Conservative followup was recommended.  Repeat colposcopy in December 2013 identified a lesion in the posterior vaginal fornix that was biopsied. Final pathology only showed atypia. A pap smear on that same day only showed LSIL.  A Pap smear on 10/01/2012 showed high-grade SIL.  Pap smear in March was low grade SIL. Patient subsequently underwent cervical dilatation, endocervical curettage, and vaginal biopsy (03/01/2013). The only abnormality found was atypia on vaginal  biopsy.  October 2014 Pap smear with low-grade SIL.     Review of Systems:10 point review of systems is negative except as noted in interval history.   Vitals: There were no vitals taken for this visit.  Physical Exam: General : The patient is a healthy woman in no acute distress.  HEENT: normocephalic, extraoccular movements normal; neck is supple without thyromegally  Lynphnodes: Supraclavicular and inguinal nodes not enlarged  Abdomen: Soft, non-tender, no ascites, no organomegally, no masses, no hernias  Pelvic:  EGBUS: Normal female  Vagina: Normal, no lesions  Urethra and Bladder: Normal, non-tender  Cervix and Uterus: The cervix appears normal but the os is stenotic. Bi-manual examination: Non-tender; no adenxal masses or nodularity  Rectal: normal sphincter tone, no masses, no blood  Lower extremities: No edema or varicosities. Normal range of motion   Procedure note: Colposcopy of the entire vagina and cervix was carried out. Using acetic acid no lesions are noted. I then applied Lugol's solution to the cervix and entire vagina and repeated colposcopy. Again no lesions were identified.    Allergies  Allergen Reactions  . Sulfa Antibiotics     Tongue swelling, throat irritated  . Adhesive [Tape]     Some bandaids cause skin irritation  . Relpax [Eletriptan Hydrobromide]     Stroke-like symptoms  . Ultram [Tramadol Hcl]     Seizure    Past Medical History  Diagnosis Date  . 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 Seizure disorder     Diagnosed at age 29  . Migraines   . 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 H/O radioactive iodine thyroid ablation 12/2009  .  Hypothyroidism   . History of kidney stones 2011  . Cancer     hx of cervical cancer  . Complication of anesthesia     after some surgeries - pt woke up with migraine  . Seizures     clonic tonic seizures - will "space out " for a very brief moment.  take  medication for seizures. HX OF MAJOR SEIZURES - LOOSING CONSCIOUSNESS-- APPROX 10 TO 12 YRS AGO- NEUROLOGIST IS DR. Raliegh Ip. [facility] [place]    Past Surgical History  Procedure Laterality Date  . Leep  02/25/2010  . Dilation and curettage of uterus  2009  . Tonsillectomy  1995  . Adenoidectomy  1995  . Nasal septum surgery  2006    Repair of deviated septum  . Leep  07/25/11  . Dilation and curettage of uterus N/A 03/01/2013    Procedure: CERVICAL DILATATION AND ENDOCERVICAL CURRETAGE AND VAAGINAL BIOPIES;  Surgeon: Alvino Chapel, MD;  Location: [facility];  Service: Gynecology;  Laterality: N/A;    Current Outpatient Prescriptions  Medication Sig Dispense Refill  . levETIRAcetam (KEPPRA) 500 MG tablet Two in the AM and three in the PM      . aspirin-acetaminophen-caffeine (EXCEDRIN MIGRAINE) 250-250-65 MG per tablet Take 2 tablets by mouth every 6 (six) hours as needed for pain.       . butalbital-acetaminophen-caffeine (FIORICET, ESGIC) 50-325-40 MG per tablet Take 1 tablet by mouth 2 (two) times daily as needed for pain or headache.       . folic acid (FOLVITE) 1 MG tablet Take 1 mg by mouth daily.      Marland Kitchen lamoTRIgine (LAMICTAL) 150 MG tablet Take 150 mg by mouth 2 (two) times daily.      Marland Kitchen levETIRAcetam (KEPPRA) 500 MG tablet Take 500 mg by mouth 2 (two) times daily. 2 tabs in am, 3 tabs in pm      . levothyroxine (SYNTHROID, LEVOTHROID) 175 MCG tablet Take 175 mcg by mouth daily before breakfast. Brand name only - synthroid, 119mcg Mon through [employer] 124mcg Friday through [facility]      . Multiple Vitamin (MULTIVITAMIN WITH MINERALS) TABS Take 1 tablet by mouth daily.      . naproxen sodium (ANAPROX) 220 MG tablet Take 440 mg by mouth 2 (two) times daily as needed (for pain).       . Norgestimate-Ethinyl Estradiol Triphasic (TRI-SPRINTEC) 0.18/0.215/0.25 MG-35 MCG tablet Take 1 tablet by mouth every morning.       . Probiotic Product (TRUBIOTICS PO) Take 1 capsule by mouth daily.        No current facility-administered medications for this visit.    History   Social History  . Marital Status: Single    Spouse Name: N/A    Number of Children: N/A  . Years of Education: N/A   Occupational History  . Not on file.   Social History Main Topics  . Smoking status: Never Smoker   . Smokeless tobacco: Never Used  . Alcohol Use: Yes     Comment: Rare  . Drug Use: No  . Sexual Activity: Not on file   Other Topics Concern  . Not on file   Social History Narrative  . No narrative on file    Family History  Problem Relation Age of Onset  . Cervical cancer Mother   . Diabetes Mother   . Skin cancer Mother   . Hypertension Father   . Leukemia Other   . Diabetes Other   .  Skin cancer Maternal Aunt       Alvino Chapel, MD 03/17/2014, 8:40 AM

## 2014-03-17 NOTE — Patient Instructions (Signed)
Follow up with Dr. Denman George for repeat Pap Smear

## 2014-04-14 ENCOUNTER — Ambulatory Visit: Payer: BC Managed Care – PPO | Attending: Gynecologic Oncology | Admitting: Gynecologic Oncology

## 2014-04-14 ENCOUNTER — Encounter: Payer: Self-pay | Admitting: Gynecologic Oncology

## 2014-04-14 ENCOUNTER — Other Ambulatory Visit (HOSPITAL_COMMUNITY)
Admission: RE | Admit: 2014-04-14 | Discharge: 2014-04-14 | Disposition: A | Discharge status: Home / Self Care | Payer: BC Managed Care – PPO | Source: Ambulatory Visit | Attending: Gynecologic Oncology | Admitting: Gynecologic Oncology

## 2014-04-14 VITALS — BP 135/89 | HR 90 | Temp 98.2°F | Ht 65.0 in | Wt 155.3 lb

## 2014-04-14 DIAGNOSIS — R87810 Cervical high risk human papillomavirus (HPV) DNA test positive: Secondary | ICD-10-CM | POA: Insufficient documentation

## 2014-04-14 DIAGNOSIS — Z124 Encounter for screening for malignant neoplasm of cervix: Secondary | ICD-10-CM | POA: Insufficient documentation

## 2014-04-14 DIAGNOSIS — Z1151 Encounter for screening for human papillomavirus (HPV): Secondary | ICD-10-CM | POA: Diagnosis present

## 2014-04-14 DIAGNOSIS — C539 Malignant neoplasm of cervix uteri, unspecified: Secondary | ICD-10-CM

## 2014-04-14 NOTE — Progress Notes (Signed)
Consult Note: Gyn-Onc   Kelly Mcclure 33 y.o. female  Chief Complaint  Patient presents with  . Follow-up    Assessment :  Stage IA 1 cervical cancer 2011. Jul 2015 Pap smear showing HGSIL and negative endocervical curettage. Colposcopy August, 2015 was negative..  Plan: Given the disparity between the recent Pap smear and the negative colposcopy of the cervix and entire vagina, it is possible that the Pap smears been over interpreted. We have repeated a Pap smear today and submitted to the cytopathology lab at [facility]. If this is reassuring we would consider resuming 3-4 monthly surveillance. If persistent HGSIL is identified, I would recommend consideration for an excisional procedure.   Interval History: Returns today for repeat pap smear. She recently saw Dr. Ronita Hipps who performed an endocervical curettage and Pap smear in July 2015. The endocervical curettage was negative although the Pap smear showed HGSIL. The followup colposcopy with Dr Fermin Schwab in August 2015 was unremarkable (no lesions seen).The patient continues to have regular cyclic menses. She denies any intermenstrual spotting or pelvic pain.   HPI: Patient had an abnormal Pap smear in 2011. 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 Surgical margins were positive. Therefore, the patient underwent a cold knife conization on 04/01/2010. Pathology from that specimen showed severe dysplasia with negative margins. The patient was subsequently followed with Pap smears. A pap in March 2012 was ascus. ECC was negative. Repeat ECC in June 2012 was negative. Pap smear in 03/03/2012 showed high-grade SIL and again ECC was negative. Conservative followup was recommended.  Repeat colposcopy in December 2013 identified a lesion in the posterior vaginal fornix that was biopsied. Final pathology only showed atypia. A pap smear on that same day only showed LSIL.   A Pap smear on 10/01/2012 showed high-grade SIL.  Pap smear in March was low grade SIL. Patient subsequently underwent cervical dilatation, endocervical curettage, and vaginal biopsy (03/01/2013). The only abnormality found was atypia on vaginal biopsy.  October 2014 Pap smear with low-grade SIL.  Review of Systems:10 point review of systems is negative except as noted in interval history.   Vitals: Blood pressure 135/89, pulse 90, temperature 98.2 F (36.8 C), temperature source Oral, height 5\' 5"  (1.651 m), weight 155 lb 4.8 oz (70.444 kg).  Physical Exam: General : The patient is a healthy woman in no acute distress.  HEENT: normocephalic, extraoccular movements normal; neck is supple without thyromegally  Lynphnodes: Supraclavicular and inguinal nodes not enlarged  Abdomen: Soft, non-tender, no ascites, no organomegally, no masses, no hernias  Pelvic:  EGBUS: Normal female  Vagina: Normal, no lesions  Urethra and Bladder: Normal, non-tender  Cervix and Uterus: The cervix appears normal but the os is stenotic. Bi-manual examination: Non-tender; no adenxal masses or nodularity  Rectal: normal sphincter tone, no masses, no blood  Lower extremities: No edema or varicosities. Normal range of motion    Allergies  Allergen Reactions  . Sulfa Antibiotics     Tongue swelling, throat irritated  . Adhesive [Tape]     Some bandaids cause skin irritation  . Relpax [Eletriptan Hydrobromide]     Stroke-like symptoms  . Ultram [Tramadol Hcl]     Seizure    Past Medical History  Diagnosis Date  . 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 Seizure disorder     Diagnosed at age 58  . Migraines   . Toxic condition due to an overly active  thyroid gland   . H/O radioactive iodine thyroid ablation 12/2009  . Hypothyroidism   . History of kidney stones 2011  . Cancer     hx of cervical cancer  . Complication of anesthesia     after some surgeries - pt woke up with  migraine  . Seizures     clonic tonic seizures - will "space out " for a very brief moment.  take medication for seizures. HX OF MAJOR SEIZURES - LOOSING CONSCIOUSNESS-- APPROX 10 TO 12 YRS AGO- NEUROLOGIST IS DR. Raliegh Ip. [facility] IN [place]    Past Surgical History  Procedure Laterality Date  . Leep  02/25/2010  . Dilation and curettage of uterus  2009  . Tonsillectomy  1995  . Adenoidectomy  1995  . Nasal septum surgery  2006    Repair of deviated septum  . Leep  07/25/11  . Dilation and curettage of uterus N/A 03/01/2013    Procedure: CERVICAL DILATATION AND ENDOCERVICAL CURRETAGE AND VAAGINAL BIOPIES;  Surgeon: Alvino Chapel, MD;  Location: [facility];  Service: Gynecology;  Laterality: N/A;    Current Outpatient Prescriptions  Medication Sig Dispense Refill  . aspirin-acetaminophen-caffeine (EXCEDRIN MIGRAINE) 250-250-65 MG per tablet Take 2 tablets by mouth every 6 (six) hours as needed for pain.       . folic acid (FOLVITE) 1 MG tablet Take 1 mg by mouth daily.      Marland Kitchen lamoTRIgine (LAMICTAL) 150 MG tablet Take 150 mg by mouth 2 (two) times daily.      Marland Kitchen levETIRAcetam (KEPPRA) 500 MG tablet Take 500 mg by mouth 2 (two) times daily. 2 tabs in am, 3 tabs in pm      . levothyroxine (SYNTHROID, LEVOTHROID) 175 MCG tablet Take 175 mcg by mouth daily before breakfast. Brand name only - synthroid, 197mcg Mon through Fri., 122mcg Sat through [employer]      . Multiple Vitamin (MULTIVITAMIN WITH MINERALS) TABS Take 1 tablet by mouth daily.      . naproxen sodium (ANAPROX) 220 MG tablet Take 440 mg by mouth 2 (two) times daily as needed (for pain).       . Norgestimate-Ethinyl Estradiol Triphasic (TRI-SPRINTEC) 0.18/0.215/0.25 MG-35 MCG tablet Take 1 tablet by mouth every morning.       . Probiotic Product (TRUBIOTICS PO) Take 1 capsule by mouth daily.      . butalbital-acetaminophen-caffeine (FIORICET, ESGIC) 50-325-40 MG per tablet Take 1 tablet by mouth 2 (two) times daily as needed for pain or  headache.        No current facility-administered medications for this visit.    History   Social History  . Marital Status: Single    Spouse Name: N/A    Number of Children: N/A  . Years of Education: N/A   Occupational History  . Not on file.   Social History Main Topics  . Smoking status: Never Smoker   . Smokeless tobacco: Never Used  . Alcohol Use: Yes     Comment: Rare  . Drug Use: No  . Sexual Activity: Not on file   Other Topics Concern  . Not on file   Social History Narrative  . No narrative on file    Family History  Problem Relation Age of Onset  . Cervical cancer Mother   . Diabetes Mother   . Skin cancer Mother   . Hypertension Father   . Leukemia Other   . Diabetes Other   . Skin cancer Maternal Aunt  Donaciano Eva, MD 04/14/2014, 3:28 PM

## 2014-04-14 NOTE — Patient Instructions (Signed)
We will call you with your Pap Smear results

## 2014-04-19 LAB — CYTOLOGY - PAP

## 2014-04-21 ENCOUNTER — Telehealth: Payer: Self-pay | Admitting: *Deleted

## 2014-04-21 NOTE — Telephone Encounter (Signed)
Pt called, gave pt MD's recommendations, majority is a reassuring LGSIL. However, given the few cells of HGSIL and the positive high risk HPV  MD is concerned that she might have a more concerning lesion higher in the endocervical canal. MD is recommending a repeat cone biopsy procedure.  Pt verbalized understanding, Dr. Ronita Hipps did previous Cone biopsy. Pt will give his office a call.

## 2014-04-21 NOTE — Telephone Encounter (Signed)
Message copied by GARNER, Aletha Halim on Fri Apr 21, 2014 11:40 AM ------      Message from: [name] C      Created: Thu Apr 20, 2014  3:22 PM       [facility] [place],      I have left [place] a message to call back regarding this pap.      The majority is a reassuring LGSIL.      However, given the few cells of HGSIL and the positive high risk HPV I am concerned that she might have a more concerning lesion higher in the endocervical canal.      I am recommending a repeat cone biopsy procedure.       If she calls on Monday, I would be happy to discuss this further.      Thanks      [name] ------

## 2014-07-25 ENCOUNTER — Encounter: Payer: Self-pay | Admitting: Gynecologic Oncology

## 2014-10-02 ENCOUNTER — Encounter (HOSPITAL_COMMUNITY): Payer: Self-pay | Admitting: Emergency Medicine

## 2014-10-02 ENCOUNTER — Emergency Department (HOSPITAL_COMMUNITY)
Admission: EM | Admit: 2014-10-02 | Discharge: 2014-10-02 | Disposition: A | Discharge status: Home / Self Care | Payer: BLUE CROSS/BLUE SHIELD | Attending: Emergency Medicine | Admitting: Emergency Medicine

## 2014-10-02 DIAGNOSIS — Y9389 Activity, other specified: Secondary | ICD-10-CM | POA: Diagnosis not present

## 2014-10-02 DIAGNOSIS — G40909 Epilepsy, unspecified, not intractable, without status epilepticus: Secondary | ICD-10-CM | POA: Insufficient documentation

## 2014-10-02 DIAGNOSIS — Z8541 Personal history of malignant neoplasm of cervix uteri: Secondary | ICD-10-CM | POA: Insufficient documentation

## 2014-10-02 DIAGNOSIS — Z79899 Other long term (current) drug therapy: Secondary | ICD-10-CM | POA: Insufficient documentation

## 2014-10-02 DIAGNOSIS — Y9289 Other specified places as the place of occurrence of the external cause: Secondary | ICD-10-CM | POA: Diagnosis not present

## 2014-10-02 DIAGNOSIS — Y998 Other external cause status: Secondary | ICD-10-CM | POA: Insufficient documentation

## 2014-10-02 DIAGNOSIS — E039 Hypothyroidism, unspecified: Secondary | ICD-10-CM | POA: Insufficient documentation

## 2014-10-02 DIAGNOSIS — Z791 Long term (current) use of non-steroidal anti-inflammatories (NSAID): Secondary | ICD-10-CM | POA: Diagnosis not present

## 2014-10-02 DIAGNOSIS — S0990XA Unspecified injury of head, initial encounter: Secondary | ICD-10-CM | POA: Diagnosis present

## 2014-10-02 DIAGNOSIS — G43909 Migraine, unspecified, not intractable, without status migrainosus: Secondary | ICD-10-CM | POA: Insufficient documentation

## 2014-10-02 DIAGNOSIS — W108XXA Fall (on) (from) other stairs and steps, initial encounter: Secondary | ICD-10-CM | POA: Insufficient documentation

## 2014-10-02 NOTE — Discharge Instructions (Signed)
Concussion  A concussion, or closed-head injury, is a brain injury caused by a direct blow to the head or by a quick and sudden movement (jolt) of the head or neck. Concussions are usually not life-threatening. Even so, the effects of a concussion can be serious. If you have had a concussion before, you are more likely to experience concussion-like symptoms after a direct blow to the head.   CAUSES  · Direct blow to the head, such as from running into another player during a soccer game, being hit in a fight, or hitting your head on a hard surface.  · A jolt of the head or neck that causes the brain to move back and forth inside the skull, such as in a car crash.  SIGNS AND SYMPTOMS  The signs of a concussion can be hard to notice. Early on, they may be missed by you, family members, and health care providers. You may look fine but act or feel differently.  Symptoms are usually temporary, but they may last for days, weeks, or even longer. Some symptoms may appear right away while others may not show up for hours or days. Every head injury is different. Symptoms include:  · Mild to moderate headaches that will not go away.  · A feeling of pressure inside your head.  · Having more trouble than usual:  ¨ Learning or remembering things you have heard.  ¨ Answering questions.  ¨ Paying attention or concentrating.  ¨ Organizing daily tasks.  ¨ Making decisions and solving problems.  · Slowness in thinking, acting or reacting, speaking, or reading.  · Getting lost or being easily confused.  · Feeling tired all the time or lacking energy (fatigued).  · Feeling drowsy.  · Sleep disturbances.  ¨ Sleeping more than usual.  ¨ Sleeping less than usual.  ¨ Trouble falling asleep.  ¨ Trouble sleeping (insomnia).  · Loss of balance or feeling lightheaded or dizzy.  · Nausea or vomiting.  · Numbness or tingling.  · Increased sensitivity to:  ¨ Sounds.  ¨ Lights.  ¨ Distractions.  · Vision problems or eyes that tire  easily.  · Diminished sense of taste or smell.  · Ringing in the ears.  · Mood changes such as feeling sad or anxious.  · Becoming easily irritated or angry for little or no reason.  · Lack of motivation.  · Seeing or hearing things other people do not see or hear (hallucinations).  DIAGNOSIS  Your health care provider can usually diagnose a concussion based on a description of your injury and symptoms. He or she will ask whether you passed out (lost consciousness) and whether you are having trouble remembering events that happened right before and during your injury.  Your evaluation might include:  · A brain scan to look for signs of injury to the brain. Even if the test shows no injury, you may still have a concussion.  · Blood tests to be sure other problems are not present.  TREATMENT  · Concussions are usually treated in an emergency department, in urgent care, or at a clinic. You may need to stay in the hospital overnight for further treatment.  · Tell your health care provider if you are taking any medicines, including prescription medicines, over-the-counter medicines, and natural remedies. Some medicines, such as blood thinners (anticoagulants) and aspirin, may increase the chance of complications. Also tell your health care provider whether you have had alcohol or are taking illegal drugs. This information   may affect treatment.  · Your health care provider will send you home with important instructions to follow.  · How fast you will recover from a concussion depends on many factors. These factors include how severe your concussion is, what part of your brain was injured, your age, and how healthy you were before the concussion.  · Most people with mild injuries recover fully. Recovery can take time. In general, recovery is slower in older persons. Also, persons who have had a concussion in the past or have other medical problems may find that it takes longer to recover from their current injury.  HOME  CARE INSTRUCTIONS  General Instructions  · Carefully follow the directions your health care provider gave you.  · Only take over-the-counter or prescription medicines for pain, discomfort, or fever as directed by your health care provider.  · Take only those medicines that your health care provider has approved.  · Do not drink alcohol until your health care provider says you are well enough to do so. Alcohol and certain other drugs may slow your recovery and can put you at risk of further injury.  · If it is harder than usual to remember things, write them down.  · If you are easily distracted, try to do one thing at a time. For example, do not try to watch TV while fixing dinner.  · Talk with family members or close friends when making important decisions.  · Keep all follow-up appointments. Repeated evaluation of your symptoms is recommended for your recovery.  · Watch your symptoms and tell others to do the same. Complications sometimes occur after a concussion. Older adults with a brain injury may have a higher risk of serious complications, such as a blood clot on the brain.  · Tell your teachers, school nurse, school counselor, coach, athletic trainer, or work manager about your injury, symptoms, and restrictions. Tell them about what you can or cannot do. They should watch for:  ¨ Increased problems with attention or concentration.  ¨ Increased difficulty remembering or learning new information.  ¨ Increased time needed to complete tasks or assignments.  ¨ Increased irritability or decreased ability to cope with stress.  ¨ Increased symptoms.  · Rest. Rest helps the brain to heal. Make sure you:  ¨ Get plenty of sleep at night. Avoid staying up late at night.  ¨ Keep the same bedtime hours on weekends and weekdays.  ¨ Rest during the day. Take daytime naps or rest breaks when you feel tired.  · Limit activities that require a lot of thought or concentration. These include:  ¨ Doing homework or job-related  work.  ¨ Watching TV.  ¨ Working on the computer.  · Avoid any situation where there is potential for another head injury (football, hockey, soccer, basketball, martial arts, downhill snow sports and horseback riding). Your condition will get worse every time you experience a concussion. You should avoid these activities until you are evaluated by the appropriate follow-up health care providers.  Returning To Your Regular Activities  You will need to return to your normal activities slowly, not all at once. You must give your body and brain enough time for recovery.  · Do not return to sports or other athletic activities until your health care provider tells you it is safe to do so.  · Ask your health care provider when you can drive, ride a bicycle, or operate heavy machinery. Your ability to react may be slower after a   brain injury. Never do these activities if you are dizzy.  · Ask your health care provider about when you can return to work or school.  Preventing Another Concussion  It is very important to avoid another brain injury, especially before you have recovered. In rare cases, another injury can lead to permanent brain damage, brain swelling, or death. The risk of this is greatest during the first 7-10 days after a head injury. Avoid injuries by:  · Wearing a seat belt when riding in a car.  · Drinking alcohol only in moderation.  · Wearing a helmet when biking, skiing, skateboarding, skating, or doing similar activities.  · Avoiding activities that could lead to a second concussion, such as contact or recreational sports, until your health care provider says it is okay.  · Taking safety measures in your home.  ¨ Remove clutter and tripping hazards from floors and stairways.  ¨ Use grab bars in bathrooms and handrails by stairs.  ¨ Place non-slip mats on floors and in bathtubs.  ¨ Improve lighting in dim areas.  SEEK MEDICAL CARE IF:  · You have increased problems paying attention or  concentrating.  · You have increased difficulty remembering or learning new information.  · You need more time to complete tasks or assignments than before.  · You have increased irritability or decreased ability to cope with stress.  · You have more symptoms than before.  Seek medical care if you have any of the following symptoms for more than 2 weeks after your injury:  · Lasting (chronic) headaches.  · Dizziness or balance problems.  · Nausea.  · Vision problems.  · Increased sensitivity to noise or light.  · Depression or mood swings.  · Anxiety or irritability.  · Memory problems.  · Difficulty concentrating or paying attention.  · Sleep problems.  · Feeling tired all the time.  SEEK IMMEDIATE MEDICAL CARE IF:  · You have severe or worsening headaches. These may be a sign of a blood clot in the brain.  · You have weakness (even if only in one hand, leg, or part of the face).  · You have numbness.  · You have decreased coordination.  · You vomit repeatedly.  · You have increased sleepiness.  · One pupil is larger than the other.  · You have convulsions.  · You have slurred speech.  · You have increased confusion. This may be a sign of a blood clot in the brain.  · You have increased restlessness, agitation, or irritability.  · You are unable to recognize people or places.  · You have neck pain.  · It is difficult to wake you up.  · You have unusual behavior changes.  · You lose consciousness.  MAKE SURE YOU:  · Understand these instructions.  · Will watch your condition.  · Will get help right away if you are not doing well or get worse.  Document Released: 10/11/2003 Document Revised: 07/26/2013 Document Reviewed: 02/10/2013  ExitCare® Patient Information ©2015 [employer]. This information is not intended to replace advice given to you by your health care provider. Make sure you discuss any questions you have with your health care provider.

## 2014-10-02 NOTE — ED Notes (Signed)
Ward, MD, made aware of patient's presentation. Neuro exam WDL.

## 2014-10-02 NOTE — ED Notes (Addendum)
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 Pt states that due to her Hx she sometimes does not remember short spans of time. Pt c/o nausea and emesis x 3 and headache.

## 2014-10-03 NOTE — ED Provider Notes (Signed)
CSN: [id number]     Arrival date & time 10/02/14  1219 History   First MD Initiated Contact with Patient 10/02/14 1456     Chief Complaint  Patient presents with  . Head Injury  . Fall     HPI Patient presents to the emergency department today after a fall today downstairs.  She states that she fell halfway down the stairs and fell backwards landing on her back and striking her head and going down to the bottom of the stairs.  She is unsure if she tripped and fell or if she had an absence seizure as she has a history of these.  She is on Keppra and Lamictal.  Spouse reports no postictal period.  She one episode of nausea vomiting.  She is not on anticoagulants.  She reports only mild headache at this time.  She denies weakness of her arms or legs.  No chest pain abdominal pain.  No neck pain.   Past Medical History  Diagnosis Date  . 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 Seizure disorder     Diagnosed at age 46  . Migraines   . 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 H/O radioactive iodine thyroid ablation 12/2009  . Hypothyroidism   . History of kidney stones 2011  . Cancer     hx of cervical cancer  . Complication of anesthesia     after some surgeries - pt woke up with migraine  . Seizures     clonic tonic seizures - will "space out " for a very brief moment.  take medication for seizures. HX OF MAJOR SEIZURES - LOOSING CONSCIOUSNESS-- APPROX 10 TO 12 YRS AGO- NEUROLOGIST IS DR. Raliegh Ip. [facility] [place]   Past Surgical History  Procedure Laterality Date  . Leep  02/25/2010  . Dilation and curettage of uterus  2009  . Tonsillectomy  1995  . Adenoidectomy  1995  . Nasal septum surgery  2006    Repair of deviated septum  . Leep  07/25/11  . Dilation and curettage of uterus N/A 03/01/2013    Procedure: CERVICAL DILATATION AND ENDOCERVICAL CURRETAGE AND VAAGINAL BIOPIES;  Surgeon: Alvino Chapel, MD;  Location: [facility];  Service:  Gynecology;  Laterality: N/A;   Family History  Problem Relation Age of Onset  . Cervical cancer Mother   . Diabetes Mother   . Skin cancer Mother   . Hypertension Father   . Leukemia Other   . Diabetes Other   . Skin cancer Maternal Aunt    History  Substance Use Topics  . Smoking status: Never Smoker   . Smokeless tobacco: Never Used  . Alcohol Use: Yes     Comment: Rare   OB History    No data available     Review of Systems  All other systems reviewed and are negative.     Allergies  Sulfa antibiotics; Adhesive; Relpax; and Ultram  Home Medications   Prior to Admission medications   Medication Sig Start Date End Date Taking? Authorizing Provider  aspirin-acetaminophen-caffeine (EXCEDRIN MIGRAINE) [phone] MG per tablet Take 2 tablets by mouth every 6 (six) hours as needed for pain.    Yes Historical Provider, MD  butalbital-acetaminophen-caffeine (FIORICET, ESGIC) 50-325-40 MG per tablet Take 1 tablet by mouth 2 (two) times daily as needed for pain or headache.    Yes Historical Provider, MD  lamoTRIgine (LAMICTAL) 150 MG tablet Take 150 mg by  mouth 2 (two) times daily.   Yes Historical Provider, MD  levETIRAcetam (KEPPRA) 500 MG tablet Take 1,500 mg by mouth 2 (two) times daily.    Yes Historical Provider, MD  levothyroxine (SYNTHROID, LEVOTHROID) 150 MCG tablet Take 150 mcg by mouth every evening. Brand name only, take 142mcg Mon through Fri.   Yes Historical Provider, MD  levothyroxine (SYNTHROID, LEVOTHROID) 175 MCG tablet Take 175 mcg by mouth daily before breakfast. Brand name only - synthroid, Only take 145mcg on Saturday and Sunday   Yes Historical Provider, MD  Multiple Vitamin (MULTIVITAMIN WITH MINERALS) TABS Take 1 tablet by mouth daily.   Yes Historical Provider, MD  naproxen sodium (ANAPROX) 220 MG tablet Take 440 mg by mouth 2 (two) times daily as needed (for pain).    Yes Historical Provider, MD  Norgestimate-Ethinyl Estradiol Triphasic (TRI-SPRINTEC)  0.18/0.215/0.25 MG-35 MCG tablet Take 1 tablet by mouth every morning.    Yes Historical Provider, MD  Probiotic Product (TRUBIOTICS PO) Take 1 capsule by mouth daily.   Yes Historical Provider, MD   BP 127/86 mmHg  Pulse 90  Temp(Src) 97.8 F (36.6 C) (Oral)  Resp 18  SpO2 98%  LMP 09/18/2014 Physical Exam  Constitutional: She is oriented to person, place, and time. She appears well-developed and well-nourished. No distress.  HENT:  Head: Normocephalic and atraumatic.  Eyes: EOM are normal. Pupils are equal, round, and reactive to light.  Neck: Normal range of motion.  C-spine is nontender.  C-spine clear by Nexus criteria.  Cardiovascular: Normal rate, regular rhythm and normal heart sounds.   Pulmonary/Chest: Effort normal and breath sounds normal.  Abdominal: Soft. She exhibits no distension. There is no tenderness.  Musculoskeletal: Normal range of motion.  Neurological: She is alert and oriented to person, place, and time.  5/5 strength in major muscle groups of  bilateral upper and lower extremities. Speech normal. No facial asymetry.   Skin: Skin is warm and dry.  Psychiatric: She has a normal mood and affect. Judgment normal.  Nursing note and vitals reviewed.   ED Course  Procedures (including critical care time) Labs Review Labs Reviewed - No data to display  Imaging Review No results found.   EKG Interpretation   Date/Time:  Monday October 02 2014 12:30:26 EST Ventricular Rate:  99 PR Interval:  147 QRS Duration: 84 QT Interval:  369 QTC Calculation: 473 R Axis:   61 Text Interpretation:  Sinus rhythm Probable left atrial enlargement  Baseline wander in lead(s) V2 V6 No old tracing to compare Confirmed by  WARD,  DO, KRISTEN ([id number]) on 10/02/2014 2:13:52 PM      MDM   Final diagnoses:  Minor head injury, initial encounter    Patient is overall well-appearing.  She is interactive and smiling laughing.  Doubt intracranial bleed.  No anticoagulant  use.  C-spine cleared by Nexus criteria.  Discharge home with primary care follow-up.  Concussion warnings given.    [name], MD 10/03/14 Laureen Abrahams

## 2015-09-12 ENCOUNTER — Other Ambulatory Visit: Payer: Self-pay | Admitting: Physician Assistant

## 2015-09-12 ENCOUNTER — Ambulatory Visit: Payer: BLUE CROSS/BLUE SHIELD | Admitting: Podiatry

## 2016-09-02 ENCOUNTER — Ambulatory Visit (INDEPENDENT_AMBULATORY_CARE_PROVIDER_SITE_OTHER)
Admission: RE | Admit: 2016-09-02 | Discharge: 2016-09-02 | Disposition: A | Discharge status: Home / Self Care | Payer: 59 | Source: Ambulatory Visit | Attending: Internal Medicine | Admitting: Internal Medicine

## 2016-09-02 ENCOUNTER — Other Ambulatory Visit (INDEPENDENT_AMBULATORY_CARE_PROVIDER_SITE_OTHER): Payer: 59

## 2016-09-02 ENCOUNTER — Ambulatory Visit (INDEPENDENT_AMBULATORY_CARE_PROVIDER_SITE_OTHER): Payer: 59 | Admitting: Internal Medicine

## 2016-09-02 ENCOUNTER — Encounter: Payer: Self-pay | Admitting: Internal Medicine

## 2016-09-02 VITALS — BP 122/78 | HR 100 | Ht 65.0 in | Wt 173.0 lb

## 2016-09-02 DIAGNOSIS — R058 Other specified cough: Secondary | ICD-10-CM

## 2016-09-02 DIAGNOSIS — R05 Cough: Secondary | ICD-10-CM

## 2016-09-02 DIAGNOSIS — R053 Chronic cough: Secondary | ICD-10-CM | POA: Insufficient documentation

## 2016-09-02 LAB — CBC WITH DIFFERENTIAL/PLATELET
BASOS ABS: 0.1 10*3/uL (ref 0.0–0.1)
Basophils Relative: 0.6 % (ref 0.0–3.0)
Eosinophils Absolute: 0.2 10*3/uL (ref 0.0–0.7)
Eosinophils Relative: 1.7 % (ref 0.0–5.0)
HCT: 45 % (ref 36.0–46.0)
Hemoglobin: 14.9 g/dL (ref 12.0–15.0)
LYMPHS ABS: 4.3 10*3/uL — AB (ref 0.7–4.0)
Lymphocytes Relative: 45.8 % (ref 12.0–46.0)
MCHC: 33.1 g/dL (ref 30.0–36.0)
MCV: 80.7 fl (ref 78.0–100.0)
MONO ABS: 0.6 10*3/uL (ref 0.1–1.0)
MONOS PCT: 6.7 % (ref 3.0–12.0)
NEUTROS ABS: 4.2 10*3/uL (ref 1.4–7.7)
NEUTROS PCT: 45.2 % (ref 43.0–77.0)
PLATELETS: 314 10*3/uL (ref 150.0–400.0)
RBC: 5.57 Mil/uL — ABNORMAL HIGH (ref 3.87–5.11)
RDW: 13.5 % (ref 11.5–15.5)
WBC: 9.3 10*3/uL (ref 4.0–10.5)

## 2016-09-02 LAB — NITRIC OXIDE

## 2016-09-02 MED ORDER — PANTOPRAZOLE SODIUM 40 MG PO TBEC: 40.0000 mg | DELAYED_RELEASE_TABLET | Freq: Every day | ORAL | 2 refills | Status: DC

## 2016-09-02 MED ORDER — ACETAMINOPHEN-CODEINE #3 300-30 MG PO TABS: ORAL_TABLET | ORAL | 0 refills | Status: DC

## 2016-09-02 MED ORDER — PREDNISONE 10 MG PO TABS: ORAL_TABLET | ORAL | 0 refills | Status: DC

## 2016-09-02 MED ORDER — FAMOTIDINE 20 MG PO TABS: ORAL_TABLET | ORAL | 2 refills | Status: DC

## 2016-09-02 NOTE — Patient Instructions (Signed)
Prednisone 10 mg take  4 each am x 2 days,   2 each am x 2 days,  1 each am x 2 days and stop   Pantoprazole (protonix) 40 mg   Take  30-60 min before first meal of the day and Pepcid (famotidine)  20 mg one @  bedtime until better x 2 weeks without the need for cough meds  For drainage / throat tickle try take CHLORPHENIRAMINE  4 mg - take one every 4 hours as needed - available over the counter- may cause drowsiness so start with just a bedtime dose or two and see how you tolerate it before trying in daytime     GERD (REFLUX)  is an extremely common cause of respiratory symptoms just like yours , many times with no obvious heartburn at all.    It can be treated with medication, but also with lifestyle changes including elevation of the head of your bed (ideally with 6 inch  bed blocks),  Smoking cessation, avoidance of late meals, excessive alcohol, and avoid fatty foods, chocolate, peppermint, colas, red wine, and acidic juices such as orange juice.  NO MINT OR MENTHOL PRODUCTS SO NO COUGH DROPS   USE SUGARLESS CANDY INSTEAD (Jolley ranchers or Stover's or Life Savers) or even ice chips will also do - the key is to swallow to prevent all throat clearing. NO OIL BASED VITAMINS - use powdered substitutes.    Take delsym two tsp every 12 hours and supplement if needed with  tramadol 50 mg up to 2 every 4 hours to suppress the urge to cough. Swallowing water or using ice chips/non mint and menthol containing candies (such as lifesavers or sugarless jolly ranchers) are also effective.  You should rest your voice and avoid activities that you know make you cough.  Once you have eliminated the cough for 3 straight days try reducing the tramadol first,  then the delsym as tolerated.    Please remember to go to the lab and x-ray department downstairs in the basement  for your tests - we will call you with the results when they are available.     Return if not all better in 2 weeks

## 2016-09-02 NOTE — Progress Notes (Signed)
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 Pulmonary Consult    Referred by Dr. Brien Few.  Pt c/o cough for the past 10 months.  Cough is mainly non prod. Cough occ wakes her up at night, tussionex is the only thing that has helped so far. Pt states she has coughed so much her chest is sore. She also c/o SOB. She states she was SOB just walking from parking lot to our building today. She occ feels SOB just at rest.   does get briefly better with cough drops / no better with gerd rx Does better in am / worse as day goes on, worse before supper and bedtime     Kouffman Reflux v Neurogenic Cough Differentiator Reflux Comments  Do you awaken from a sound sleep coughing violently?                            With trouble breathing? sometimes   Do you have choking episodes when you cannot  Get enough air, gasping for air ?              sometimes   Do you usually cough when you lie down into  The bed, or when you just lie down to rest ?                          Not typically   Do you usually cough after meals or eating?         Crackers yes   Do you cough when (or after) you bend over?    no   GERD SCORE     Kouffman Reflux v Neurogenic Cough Differentiator Neurogenic   Do you more-or-less cough all day long? Off and on   Does change of temperature make you cough? no   Does laughing or chuckling cause you to cough?  sometimes   Do fumes (perfume, automobile fumes, burned  Toast, etc.,) cause you to cough ?      no   Does speaking, singing, or talking on the phone cause you to cough   ?               no   Neurogenic/Airway score       No other obvious day to day or daytime variability or assoc excess/ purulent sputum or mucus plugs or hemoptysis   or chest tightness, subjective wheeze or overt sinus or hb symptoms. No unusual exp hx or h/o childhood pna/ asthma or knowledge of premature birth.  Sleeping ok without nocturnal  or early am exacerbation  of respiratory  c/o's or need for noct saba. Also denies any obvious fluctuation of symptoms with weather or environmental changes or other aggravating or alleviating factors except as outlined  above   Current Medications, Allergies, Complete Past Medical History, Past Surgical History, Family History, and Social History were reviewed in [employer] record.  ROS  The following are not active complaints unless bolded sore throat, dysphagia, dental problems, itching, sneezing,  nasal congestion or excess/ purulent secretions, ear ache,   fever, chills, sweats, unintended wt loss, classically pleuritic or exertional cp,  orthopnea pnd or leg swelling, presyncope, palpitations, abdominal pain, anorexia, nausea, vomiting, diarrhea  or change in bowel or bladder habits, change in stools or urine, dysuria,hematuria,  rash, arthralgias, visual complaints, headache, numbness, weakness or ataxia or problems with walking or coordination,  change in mood/affect or memory.        Review of Systems     Objective:   Physical Exam amb wf with honking upper airway cough  Wt Readings from Last 3 Encounters:  09/02/16 173 lb (78.5 kg)  04/14/14 155 lb 4.8 oz (70.4 kg)  03/17/14 150 lb 6.4 oz (68.2 kg)    Vital signs reviewed - Note on arrival 02 sats  98% on RA     HEENT: nl dentition,  and oropharynx. Nl external ear canals without cough reflex -  moderate bilateral non-specific turbinate edema     NECK :  without JVD/Nodes/TM/ nl carotid upstrokes bilaterally   LUNGS: no acc muscle use,  Nl contour chest which is clear to A and P bilaterally without reproducible cough on insp or exp maneuvers   CV:  RRR  no s3 or murmur or increase in P2, nad no edema   ABD:  soft and nontender with nl inspiratory excursion in the supine position. No bruits or organomegaly appreciated, bowel sounds nl  MS:  Nl gait/ ext warm without deformities, calf tenderness, cyanosis or clubbing No obvious joint restrictions   SKIN: warm and dry without lesions    NEURO:  alert, approp, nl sensorium with  no motor or cerebellar deficits apparent.     CXR PA and Lateral:   09/02/2016 :    I personally reviewed images and agree with radiology impression as follows:    no acute cardiopulmonary changes  Labs ordered 09/02/2016   Allergy profile      Assessment:

## 2016-09-02 NOTE — Assessment & Plan Note (Addendum)
FENO 09/02/2016  =   < 5  Allergy profile 09/02/2016 >  Eos 0. /  IgE  Pending   The most common causes of chronic cough in immunocompetent adults include the following: upper airway cough syndrome (UACS), previously referred to as postnasal drip syndrome (PNDS), which is caused by variety of rhinosinus conditions; (2) asthma; (3) GERD; (4) chronic bronchitis from cigarette smoking or other inhaled environmental irritants; (5) nonasthmatic eosinophilic bronchitis; and (6) bronchiectasis.   These conditions, singly or in combination, have accounted for up to 94% of the causes of chronic cough in prospective studies.   Other conditions have constituted no >6% of the causes in prospective studies These have included bronchogenic carcinoma, chronic interstitial pneumonia, sarcoidosis, left ventricular failure, ACEI-induced cough, and aspiration from a condition associated with pharyngeal dysfunction.    Chronic cough is often simultaneously caused by more than one condition. A single cause has been found from 38 to 82% of the time, multiple causes from 18 to 62%. Multiply caused cough has been the result of three diseases up to 42% of the time.       Most likely this is not asthma but rather Upper airway cough syndrome (previously labeled PNDS) , is  so named because it's frequently impossible to sort out how much is  CR/sinusitis with freq throat clearing (which can be related to primary GERD)   vs  causing  secondary (" extra esophageal")  GERD from wide swings in gastric pressure that occur with throat clearing, often  promoting self use of mint and menthol lozenges that reduce the lower esophageal sphincter tone and exacerbate the problem further in a cyclical fashion.   These are the same pts (now being labeled as having "irritable larynx syndrome" by some cough centers) who not infrequently have a history of having failed to tolerate ace inhibitors,  dry powder inhalers or biphosphonates or report  having atypical/extraesophageal reflux symptoms that don't respond to standard doses of PPI  and are easily confused as having aecopd or asthma flares by even experienced allergists/ pulmonologists (myself included).   Of the three most common causes of chronic cough, only one (GERD)  can actually cause the other two (asthma and post nasal drip syndrome)  and perpetuate the cylce of cough inducing airway trauma, inflammation, heightened sensitivity to reflux which is prompted by the cough itself via a cyclical mechanism.    This may partially respond to steroids and look like asthma and post nasal drainage but never erradicated completely unless the cough and the secondary reflux are eliminated, preferably both at the same time.  While not intuitively obvious, many patients with chronic low grade reflux do not cough until there is a secondary insult that disturbs the protective epithelial barrier and exposes sensitive nerve endings.  This can be viral or direct physical injury such as with an endotracheal tube.   The point is that once this occurs, it is difficult to eliminate using anything but a maximally effective acid suppression regimen at least in the short run, accompanied by an appropriate diet to address non acid GERD.   Will try max rx for gerd/ eliminate cyclical cough with tylenol # 3 and any pnds with 1st gen H1 per guidelines  then regroup in 2 weeks prn  Reviewed with pt  The standardized cough guidelines published in Chest by Lissa Morales in 2006 are still the best available and consist of a multiple step process (up to 12!) , not a single office  visit,  and are intended  to address this problem logically,  with an alogrithm dependent on response to empiric treatment at  each progressive step  to determine a specific diagnosis with  minimal addtional testing needed. Therefore if adherence is an issue or can't be accurately verified,  it's very unlikely the standard evaluation and  treatment will be successful here.    Furthermore, response to therapy (other than acute cough suppression, which should only be used short term with avoidance of narcotic containing cough syrups if possible), can be a gradual process for which the patient is not likely to  perceive immediate benefit.  Unlike going to an eye doctor where the best perscription is almost always the first one and is immediately effective, this is almost never the case in the management of chronic cough syndromes. Therefore the patient needs to commit up front to consistently adhere to recommendations  for up to 6 weeks of therapy directed at the likely underlying problem(s) before the response can be reasonably evaluated.   Total time devoted to counseling  > 50 % of initial 60 min office visit:  review case with pt/ discussion of options/alternatives/ personally creating written customized instructions  in presence of pt  then going over those specific  Instructions directly with the pt including how to use all of the meds but in particular covering each new medication in detail and the difference between the maintenance/automatic meds and the prns using an action plan format for the latter.  Please see AVS from this visit for a full list of these instructions which I personally wrote for this pt and  are unique to this visit.

## 2016-09-03 LAB — RESPIRATORY ALLERGY PROFILE REGION II ~~LOC~~
ALLERGEN, D PTERNOYSSINUS, D1: 2.3 kU/L — AB
Allergen, C. Herbarum, M2: <0.1 kU/L
Allergen, Comm Silver Birch, t9: <0.1 kU/L
Allergen, Cottonwood, t14: <0.1 kU/L
Allergen, Mulberry, t76: <0.1 kU/L
Allergen, P. notatum, m1: <0.1 kU/L
Aspergillus fumigatus, m3: <0.1 kU/L
Bermuda Grass: <0.1 kU/L
Cockroach: <0.1 kU/L
Common Ragweed: <0.1 kU/L
D. farinae: 2.73 kU/L — ABNORMAL HIGH
IGE (IMMUNOGLOBULIN E), SERUM: 8 kU/L (ref <115)
Johnson Grass: <0.1 kU/L
Pecan/Hickory Tree IgE: <0.1 kU/L
Timothy Grass: <0.1 kU/L

## 2016-09-03 NOTE — Progress Notes (Signed)
Left a detailed msg with results

## 2016-09-04 NOTE — Progress Notes (Signed)
Left detailed msg with results

## 2016-11-12 LAB — OB RESULTS CONSOLE HIV ANTIBODY (ROUTINE TESTING): HIV: NONREACTIVE

## 2016-11-12 LAB — OB RESULTS CONSOLE ABO/RH: RH Type: POSITIVE

## 2016-11-12 LAB — OB RESULTS CONSOLE RPR: RPR: NONREACTIVE

## 2016-11-12 LAB — OB RESULTS CONSOLE ANTIBODY SCREEN: Antibody Screen: NEGATIVE

## 2016-11-12 LAB — OB RESULTS CONSOLE GC/CHLAMYDIA
CHLAMYDIA, DNA PROBE: NEGATIVE
GC PROBE AMP, GENITAL: NEGATIVE

## 2016-11-12 LAB — OB RESULTS CONSOLE RUBELLA ANTIBODY, IGM: RUBELLA: NON-IMMUNE/NOT IMMUNE

## 2016-11-12 LAB — OB RESULTS CONSOLE HEPATITIS B SURFACE ANTIGEN: Hepatitis B Surface Ag: NEGATIVE

## 2016-11-17 ENCOUNTER — Encounter (HOSPITAL_COMMUNITY): Payer: Self-pay

## 2016-11-17 ENCOUNTER — Encounter (HOSPITAL_COMMUNITY)
Admission: RE | Admit: 2016-11-17 | Discharge: 2016-11-17 | Disposition: A | Discharge status: Home / Self Care | Payer: 59 | Source: Ambulatory Visit | Attending: Obstetrics and Gynecology | Admitting: Obstetrics and Gynecology

## 2016-11-17 DIAGNOSIS — Z8049 Family history of malignant neoplasm of other genital organs: Secondary | ICD-10-CM | POA: Diagnosis not present

## 2016-11-17 DIAGNOSIS — O26872 Cervical shortening, second trimester: Secondary | ICD-10-CM | POA: Diagnosis present

## 2016-11-17 DIAGNOSIS — Z87442 Personal history of urinary calculi: Secondary | ICD-10-CM | POA: Diagnosis not present

## 2016-11-17 DIAGNOSIS — Z8042 Family history of malignant neoplasm of prostate: Secondary | ICD-10-CM | POA: Diagnosis not present

## 2016-11-17 DIAGNOSIS — O99351 Diseases of the nervous system complicating pregnancy, first trimester: Secondary | ICD-10-CM | POA: Diagnosis not present

## 2016-11-17 DIAGNOSIS — O99611 Diseases of the digestive system complicating pregnancy, first trimester: Secondary | ICD-10-CM | POA: Diagnosis not present

## 2016-11-17 DIAGNOSIS — E039 Hypothyroidism, unspecified: Secondary | ICD-10-CM | POA: Diagnosis not present

## 2016-11-17 DIAGNOSIS — O99281 Endocrine, nutritional and metabolic diseases complicating pregnancy, first trimester: Secondary | ICD-10-CM | POA: Diagnosis not present

## 2016-11-17 DIAGNOSIS — Z833 Family history of diabetes mellitus: Secondary | ICD-10-CM | POA: Diagnosis not present

## 2016-11-17 DIAGNOSIS — O26879 Cervical shortening, unspecified trimester: Secondary | ICD-10-CM | POA: Diagnosis not present

## 2016-11-17 DIAGNOSIS — Z3A11 11 weeks gestation of pregnancy: Secondary | ICD-10-CM | POA: Diagnosis not present

## 2016-11-17 DIAGNOSIS — Z808 Family history of malignant neoplasm of other organs or systems: Secondary | ICD-10-CM | POA: Diagnosis not present

## 2016-11-17 DIAGNOSIS — G43909 Migraine, unspecified, not intractable, without status migrainosus: Secondary | ICD-10-CM | POA: Diagnosis not present

## 2016-11-17 DIAGNOSIS — Z8249 Family history of ischemic heart disease and other diseases of the circulatory system: Secondary | ICD-10-CM | POA: Diagnosis not present

## 2016-11-17 DIAGNOSIS — Z806 Family history of leukemia: Secondary | ICD-10-CM | POA: Diagnosis not present

## 2016-11-17 DIAGNOSIS — G40409 Other generalized epilepsy and epileptic syndromes, not intractable, without status epilepticus: Secondary | ICD-10-CM | POA: Diagnosis not present

## 2016-11-17 DIAGNOSIS — K219 Gastro-esophageal reflux disease without esophagitis: Secondary | ICD-10-CM | POA: Diagnosis not present

## 2016-11-17 DIAGNOSIS — Z8541 Personal history of malignant neoplasm of cervix uteri: Secondary | ICD-10-CM | POA: Diagnosis not present

## 2016-11-17 DIAGNOSIS — Z825 Family history of asthma and other chronic lower respiratory diseases: Secondary | ICD-10-CM | POA: Diagnosis not present

## 2016-11-17 LAB — CBC
HEMATOCRIT: 39.6 % (ref 36.0–46.0)
HEMOGLOBIN: 13.3 g/dL (ref 12.0–15.0)
MCH: 26.9 pg (ref 26.0–34.0)
MCHC: 33.6 g/dL (ref 30.0–36.0)
MCV: 80.2 fL (ref 78.0–100.0)
Platelets: 295 10*3/uL (ref 150–400)
RBC: 4.94 MIL/uL (ref 3.87–5.11)
RDW: 14.3 % (ref 11.5–15.5)
WBC: 12 10*3/uL — AB (ref 4.0–10.5)

## 2016-11-17 LAB — TYPE AND SCREEN
ABO/RH(D): A POS
ANTIBODY SCREEN: NEGATIVE

## 2016-11-17 LAB — ABO/RH: ABO/RH(D): A POS

## 2016-11-17 NOTE — Patient Instructions (Addendum)
Your procedure is scheduled on:  Wednesday, November 19, 2016  Enter through the [employer] [facility] at:  9:00 AM  Pick up the phone at the desk and dial [phone].  Call this number if you have problems the morning of surgery: [phone].  Remember: Do NOT eat food or drink after:  Midnight Tuesday  Take these medicines the morning of surgery with a SIP OF WATER:  Levothyroxine, Lamotrigine, Keppra  Stop ALL herbal medications at this time  Do NOT smoke the day of surgery.  Do NOT wear jewelry (body piercing), metal hair clips/bobby pins, make-up, or nail polish. Do NOT wear lotions, powders, or perfumes.  You may wear deodorant. Do NOT shave for 48 hours prior to surgery. Do NOT bring valuables to the hospital. Contacts, dentures, or bridgework may not be worn into surgery.  Have a responsible adult drive you home and stay with you for 24 hours after your procedure  Bring a copy of your healthcare power of attorney and living will documents.

## 2016-11-18 ENCOUNTER — Ambulatory Visit (INDEPENDENT_AMBULATORY_CARE_PROVIDER_SITE_OTHER): Payer: 59 | Admitting: Internal Medicine

## 2016-11-18 ENCOUNTER — Encounter: Payer: Self-pay | Admitting: Internal Medicine

## 2016-11-18 VITALS — BP 132/52 | HR 88 | Temp 98.0°F | Ht 67.0 in | Wt 167.0 lb

## 2016-11-18 DIAGNOSIS — E890 Postprocedural hypothyroidism: Secondary | ICD-10-CM | POA: Diagnosis not present

## 2016-11-18 MED ORDER — SYNTHROID 175 MCG PO TABS: 175.0000 ug | ORAL_TABLET | Freq: Every day | ORAL | 1 refills | Status: DC

## 2016-11-18 NOTE — Anesthesia Preprocedure Evaluation (Addendum)
Anesthesia Evaluation    History of Anesthesia Complications Negative for: history of anesthetic complications  Airway Mallampati: II  TM Distance: >3 FB Neck ROM: Full    Dental no notable dental hx.    Pulmonary neg pulmonary ROS,    Pulmonary exam normal breath sounds clear to auscultation       Cardiovascular negative cardio ROS Normal cardiovascular exam Rhythm:Regular Rate:Normal     Neuro/Psych Seizures - (daily absense seizures), Poorly Controlled,  negative psych ROS   GI/Hepatic Neg liver ROS,   Endo/Other  Hypothyroidism   Renal/GU negative Renal ROS     Musculoskeletal   Abdominal   Peds  Hematology   Anesthesia Other Findings   Reproductive/Obstetrics                            Anesthesia Physical Anesthesia Plan  ASA: II  Anesthesia Plan: General   Post-op Pain Management:    Induction: Intravenous  Airway Management Planned: Oral ETT  Additional Equipment:   Intra-op Plan:   Post-operative Plan: Extubation in OR  Informed Consent: I have reviewed the patients History and Physical, chart, labs and discussed the procedure including the risks, benefits and alternatives for the proposed anesthesia with the patient or authorized representative who has indicated his/her understanding and acceptance.   Dental advisory given  Plan Discussed with: CRNA  Anesthesia Plan Comments: (Risk of GA in first trimester discussed including loss of fetus and organogensis disruption. Will minimize risk with generally accepted safe medications.)      Anesthesia Quick Evaluation

## 2016-11-18 NOTE — Progress Notes (Signed)
Patient ID: Kelly Mcclure, female   DOB: 1981-01-27, 36 y.o.   MRN: [id number]    HPI  Kelly Mcclure is a 36 y.o.-year-old female, referred by her ObGyn Dr., Dr. Ronita Hipps, for management of hypothyroidism in pregnancy (11 weeks - LMP 08/31/2016) - first pregnancy.  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 She was started on Synthroid 2003 (!) soon after.  She had a new thyroid uptake and scan >> normal uptake but again demonstrated hot nodule on scan >> RAI treatment in 12/2009 (Dr. Chalmers Cater), after which she developed hypothyroidism >> on Levothyroxine.  She takes the DAW Synthroid: - fasting - with water - separated by 30-60 min from b'fast  - no calcium (Tums occas. After lunch), iron, PPIs - + prenatal multivitamins - at night  I reviewed pt's thyroid tests: 11/11/2016: TSH 0.58 (0.4-4), free T3 3.3 (2-4.4), free T4 1.79 (0.82-1.77) - on LT4 175 mcg 10/2016: TSH 14 - on LT4 137 mcg  No results found for: TSH, FREET4   Pt describes: - + weight gain - + fatigue - no cold intolerance - no depression, + anxiety - no constipation - no dry skin - + hair loss  Pt denies feeling nodules in neck, hoarseness, + dysphagia with dry foods occas./no odynophagia, SOB with lying down.  She has + FH of thyroid disorders in: great aunt. + poss. FH of thyroid cancer.  No h/o radiation tx to head or neck, except for the RAI tx.  No recent use of iodine supplements.  Pt. also has a history of cervical CA >> had conization. She will have cerclage tomorrow. She has a h/o absence seizures. On Lamictal and Keppra.  ROS: Constitutional: + see HPI, + nocturia, + poor sleep Eyes: no blurry vision, no xerophthalmia ENT: no sore throat, + see HPI Cardiovascular: no CP/SOB/palpitations/leg swelling Respiratory: + cough/no SOB Gastrointestinal: + N/no V/D/C, + acid reflux Musculoskeletal: no muscle/joint aches Skin: no rashes, + hair  loss Neurological: no tremors/numbness/tingling/dizziness, + HA, + absence seizures Psychiatric: no depression/+ anxiety  Past Medical History:  Diagnosis Date  . Cancer (HCC)    hx of cervical cancer  . Complication of anesthesia    after some surgeries - pt woke up with migraine  . GERD (gastroesophageal reflux disease)    with pregnancy  . H/O radioactive iodine thyroid ablation 12/2009  . 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 History of kidney stones 2011  . Hypothyroidism   . Migraines   . Seizure disorder ([place])    Diagnosed at age 24  . Seizures ([place])    clonic tonic seizures - will "space out " for a very brief moment.  take medication for seizures. HX OF MAJOR SEIZURES - LOOSING CONSCIOUSNESS-- APPROX 10 TO 12 YRS AGO- NEUROLOGIST IS DR. Raliegh Ip. [place], absent seizures  . 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 ADENOIDECTOMY  1995  . cold knife conization    . DILATION AND CURETTAGE OF UTERUS  2009  . DILATION AND CURETTAGE OF UTERUS N/A 03/01/2013   Procedure: CERVICAL DILATATION AND ENDOCERVICAL CURRETAGE AND VAAGINAL BIOPIES;  Surgeon: Alvino Chapel, MD;  Location: [facility];  Service: Gynecology;  Laterality: N/A;  . LEEP  02/25/2010  . LEEP  07/25/11  . MOUTH SURGERY    . NASAL SEPTUM SURGERY  2006   Repair of deviated septum  . SIGMOIDOSCOPY    . TONSILLECTOMY  1995   Social History   Social History  . Marital status: Engaged    Spouse name: N/A  . Number of children: 0   Occupational History  . Triage Administrative Coordinator at [employer]  . Smoking status: Never Smoker  . Smokeless tobacco: Never Used  . Alcohol use Yes     Comment: Rare  . Drug use: No   Current Outpatient Prescriptions on File Prior to Visit  Medication Sig Dispense Refill  . acetaminophen (TYLENOL) 500 MG tablet Take 1,000 mg by  mouth every 6 (six) hours as needed for mild pain or headache.    Marland Kitchen acetaminophen-codeine (TYLENOL #3) 300-30 MG tablet One every 4 hours as needed for cough (Patient not taking: Reported on 11/14/2016) 40 tablet 0  . calcium carbonate (TUMS - DOSED IN MG ELEMENTAL CALCIUM) 500 MG chewable tablet Chew 2 tablets by mouth daily as needed for indigestion or heartburn.    . dextromethorphan (DELSYM) 30 MG/5ML liquid Take by mouth as needed for cough.    . famotidine (PEPCID) 20 MG tablet One at bedtime (Patient not taking: Reported on 11/14/2016) 30 tablet 2  . fluticasone (FLONASE) 50 MCG/ACT nasal spray Place 2 sprays into both nostrils daily as needed for allergies or rhinitis.    Marland Kitchen lamoTRIgine (LAMICTAL) 150 MG tablet Take 450 mg by mouth 2 (two) times daily. TAKES 3 TABLETS TWICE DAILY    . levETIRAcetam (KEPPRA) 500 MG tablet Take 1,500 mg by mouth 2 (two) times daily. TAKES 3 TABLETS TWICE DAILY    . levothyroxine (SYNTHROID, LEVOTHROID) 175 MCG tablet Take 175 mcg by mouth daily before breakfast. Brand name only - synthroid,    . ondansetron (ZOFRAN) 4 MG tablet Take 4 mg by mouth 3 (three) times daily as needed for nausea or vomiting.   0  . pantoprazole (PROTONIX) 40 MG tablet Take 1 tablet (40 mg total) by mouth daily. Take 30-60 min before first meal of the day (Patient not taking: Reported on 11/14/2016) 30 tablet 2  . predniSONE (DELTASONE) 10 MG tablet Take  4 each am x 2 days,   2 each am x 2 days,  1 each am x 2 days and stop (Patient not taking: Reported on 11/14/2016) 14 tablet 0  . Prenat w/o A-FeCbGl-DSS-FA-DHA (CITRANATAL 90 DHA) 90-1 & 300 MG MISC Take by mouth.    . Prenatal-Fe-Methylfol-FA-DHA (PNV-DHA PLUS PO) Take 1 tablet by mouth daily.     No current facility-administered medications on file prior to visit.    Allergies  Allergen Reactions  . Sulfa Antibiotics     Tongue swelling, throat irritated  . Adhesive [Tape]     Some bandaids cause skin irritation  . Relpax  [Eletriptan Hydrobromide]     Stroke-like symptoms  . Ultram [Tramadol Hcl]     Seizure   Family History  Problem Relation Age of Onset  . Leukemia Other   . Diabetes Other   . Cervical cancer Mother   . Diabetes Mother   . Skin cancer Mother   . Hyperlipidemia Mother   . Hypertension Father   . Asthma Father   . Skin cancer Maternal Aunt   . Multiple myeloma Maternal Aunt   . Testicular cancer Maternal Uncle   . Hyperlipidemia Maternal Grandmother   . Prostate cancer Maternal Grandfather    PE: BP (!) 132/52 (BP  Location: Left Arm, Patient Position: Sitting, Cuff Size: Normal)   Pulse 88   Temp 98 F (36.7 C) (Oral)   Ht '5\' 7"'  (1.702 m)   Wt 167 lb (75.8 kg)   LMP 08/31/2016 (Exact Date)   SpO2 97%   BMI 26.16 kg/m  Wt Readings from Last 3 Encounters:  11/18/16 167 lb (75.8 kg)  11/17/16 167 lb 6 oz (75.9 kg)  09/02/16 173 lb (78.5 kg)   Constitutional: normal weight, in NAD Eyes: PERRLA, EOMI, no exophthalmos ENT: moist mucous membranes, no thyromegaly, no cervical lymphadenopathy Cardiovascular: RRR, No MRG Respiratory: CTA B Gastrointestinal: abdomen soft, NT, ND, BS+ Musculoskeletal: no deformities, strength intact in all 4 Skin: moist, warm, no rashes Neurological: no tremor with outstretched hands, DTR normal in all 4  ASSESSMENT: 1. Postablative Hypothyroidism  PLAN:  1. Patient with long-standing hypothyroidism, on levothyroxine therapy (Synthroid d.a.w.). She recently had a TSH of 14, after which her levothyroxine dose was increased from 137 to 175 g daily. On this dose, her TSH normalized. Last set of labs are from approximately 1 week ago and her TSH is at goal for first trimester of pregnancy, being lower than 2 (0.58). We did discuss about TSH goals for the second (less than 2.5) and the third (less than 3) trimesters. We decided to continue the current dose of Synthroid and recheck her labs in 3 more weeks, 1 month from the previous set of labs. I  would like to make sure that her TSH does not continue to decrease on this dose. If the labs are normal at that time, we can repeat them in 3 more months. If they are abnormal, we'll need to change her Synthroid dose and repeat them in another month. - she appears euthyroid and does not complain of tremors, palpitations, hot flashes or increased anxiety. She tells me she gained 5 pounds since her pregnancy, although per records reviewed, she has a net weight loss of 6 pounds since 08/2016. - she does not appear to have a goiter, thyroid nodules, or neck compression symptoms - We discussed about correct intake of levothyroxine, fasting, with water, separated by at least 30 minutes from breakfast, and separated by more than 4 hours from calcium, iron, multivitamins, acid reflux medications (PPIs). She is taking it correctly. - Refilled her current dose of Synthroid, 175 g daily. - I will see her back in 4 months  Philemon Kingdom, MD PhD [facility]

## 2016-11-18 NOTE — Patient Instructions (Addendum)
Please come back for labs in 3 weeks.  For now, continue Levothyroxine 175 mcg daily.  Take the thyroid hormone every day, with water, at least 30 minutes before breakfast, separated by at least 4 hours from: - acid reflux medications - calcium - iron - multivitamins  Please return in 4 months.

## 2016-11-19 ENCOUNTER — Ambulatory Visit (HOSPITAL_COMMUNITY): Payer: 59 | Admitting: Anesthesiology

## 2016-11-19 ENCOUNTER — Ambulatory Visit (HOSPITAL_COMMUNITY)
Admission: RE | Admit: 2016-11-19 | Discharge: 2016-11-19 | Disposition: A | Discharge status: Home / Self Care | Payer: 59 | Source: Ambulatory Visit | Attending: Obstetrics and Gynecology | Admitting: Obstetrics and Gynecology

## 2016-11-19 ENCOUNTER — Encounter (HOSPITAL_COMMUNITY): Payer: Self-pay

## 2016-11-19 ENCOUNTER — Encounter (HOSPITAL_COMMUNITY)
Admission: RE | Disposition: A | Discharge status: Home / Self Care | Payer: Self-pay | Source: Ambulatory Visit | Attending: Obstetrics and Gynecology

## 2016-11-19 ENCOUNTER — Ambulatory Visit (HOSPITAL_COMMUNITY): Payer: 59

## 2016-11-19 DIAGNOSIS — Z8049 Family history of malignant neoplasm of other genital organs: Secondary | ICD-10-CM | POA: Insufficient documentation

## 2016-11-19 DIAGNOSIS — G43909 Migraine, unspecified, not intractable, without status migrainosus: Secondary | ICD-10-CM | POA: Insufficient documentation

## 2016-11-19 DIAGNOSIS — Z3A11 11 weeks gestation of pregnancy: Secondary | ICD-10-CM | POA: Insufficient documentation

## 2016-11-19 DIAGNOSIS — O99611 Diseases of the digestive system complicating pregnancy, first trimester: Secondary | ICD-10-CM | POA: Insufficient documentation

## 2016-11-19 DIAGNOSIS — E039 Hypothyroidism, unspecified: Secondary | ICD-10-CM | POA: Insufficient documentation

## 2016-11-19 DIAGNOSIS — Z808 Family history of malignant neoplasm of other organs or systems: Secondary | ICD-10-CM | POA: Insufficient documentation

## 2016-11-19 DIAGNOSIS — O26879 Cervical shortening, unspecified trimester: Secondary | ICD-10-CM | POA: Diagnosis not present

## 2016-11-19 DIAGNOSIS — O99281 Endocrine, nutritional and metabolic diseases complicating pregnancy, first trimester: Secondary | ICD-10-CM | POA: Insufficient documentation

## 2016-11-19 DIAGNOSIS — Z87442 Personal history of urinary calculi: Secondary | ICD-10-CM | POA: Insufficient documentation

## 2016-11-19 DIAGNOSIS — G40409 Other generalized epilepsy and epileptic syndromes, not intractable, without status epilepticus: Secondary | ICD-10-CM | POA: Insufficient documentation

## 2016-11-19 DIAGNOSIS — Z8541 Personal history of malignant neoplasm of cervix uteri: Secondary | ICD-10-CM | POA: Insufficient documentation

## 2016-11-19 DIAGNOSIS — K219 Gastro-esophageal reflux disease without esophagitis: Secondary | ICD-10-CM | POA: Insufficient documentation

## 2016-11-19 DIAGNOSIS — O2622 Pregnancy care for patient with recurrent pregnancy loss, second trimester: Secondary | ICD-10-CM

## 2016-11-19 DIAGNOSIS — Z833 Family history of diabetes mellitus: Secondary | ICD-10-CM | POA: Insufficient documentation

## 2016-11-19 DIAGNOSIS — O99351 Diseases of the nervous system complicating pregnancy, first trimester: Secondary | ICD-10-CM | POA: Insufficient documentation

## 2016-11-19 DIAGNOSIS — Z825 Family history of asthma and other chronic lower respiratory diseases: Secondary | ICD-10-CM | POA: Insufficient documentation

## 2016-11-19 DIAGNOSIS — Z8042 Family history of malignant neoplasm of prostate: Secondary | ICD-10-CM | POA: Insufficient documentation

## 2016-11-19 DIAGNOSIS — Z806 Family history of leukemia: Secondary | ICD-10-CM | POA: Insufficient documentation

## 2016-11-19 DIAGNOSIS — Z8249 Family history of ischemic heart disease and other diseases of the circulatory system: Secondary | ICD-10-CM | POA: Insufficient documentation

## 2016-11-19 HISTORY — PX: ABDOMINAL CERCLAGE: SHX5384

## 2016-11-19 HISTORY — PX: CERCLAGE LAPAROSCOPIC ABDOMINAL: SHX5769

## 2016-11-19 SURGERY — CERCLAGE, CERVIX, ABDOMINAL APPROACH
Anesthesia: General | Site: Abdomen

## 2016-11-19 MED ORDER — FENTANYL CITRATE (PF) 100 MCG/2ML IJ SOLN
INTRAMUSCULAR | Status: AC
  Filled 2016-11-19: qty 2

## 2016-11-19 MED ORDER — FENTANYL CITRATE (PF) 100 MCG/2ML IJ SOLN
INTRAMUSCULAR | Status: DC
Start: 2016-11-19 — End: 2016-11-19
  Filled 2016-11-19: qty 2

## 2016-11-19 MED ORDER — LACTATED RINGERS IR SOLN
Status: DC | PRN
  Administered 2016-11-19: 3000 mL

## 2016-11-19 MED ORDER — PROPOFOL 10 MG/ML IV BOLUS
INTRAVENOUS | Status: AC
  Filled 2016-11-19: qty 20

## 2016-11-19 MED ORDER — MIDAZOLAM HCL 2 MG/2ML IJ SOLN
INTRAMUSCULAR | Status: AC
  Filled 2016-11-19: qty 2

## 2016-11-19 MED ORDER — FENTANYL CITRATE (PF) 250 MCG/5ML IJ SOLN
INTRAMUSCULAR | Status: AC
  Filled 2016-11-19: qty 5

## 2016-11-19 MED ORDER — BUPIVACAINE HCL 0.25 % IJ SOLN
INTRAMUSCULAR | Status: DC | PRN
Start: 2016-11-19 — End: 2016-11-19
  Administered 2016-11-19: 4 mL
  Administered 2016-11-19: 16 mL

## 2016-11-19 MED ORDER — FENTANYL CITRATE (PF) 100 MCG/2ML IJ SOLN
25.0000 ug | INTRAMUSCULAR | Status: DC | PRN
  Administered 2016-11-19 (×4): 50 ug via INTRAVENOUS

## 2016-11-19 MED ORDER — LIDOCAINE HCL (CARDIAC) 20 MG/ML IV SOLN
INTRAVENOUS | Status: DC | PRN
  Administered 2016-11-19: 80 mg via INTRAVENOUS

## 2016-11-19 MED ORDER — INDOMETHACIN 50 MG RE SUPP
50.0000 mg | Freq: Once | RECTAL | Status: AC
  Administered 2016-11-19: 50 mg via RECTAL
  Filled 2016-11-19: qty 1

## 2016-11-19 MED ORDER — INDOMETHACIN 25 MG PO CAPS: 25.0000 mg | ORAL_CAPSULE | Freq: Three times a day (TID) | ORAL | 0 refills | Status: DC

## 2016-11-19 MED ORDER — OXYCODONE-ACETAMINOPHEN 7.5-325 MG PO TABS: 1.0000 | ORAL_TABLET | ORAL | 0 refills | Status: DC | PRN

## 2016-11-19 MED ORDER — ONDANSETRON HCL 4 MG PO TABS: 4.0000 mg | ORAL_TABLET | Freq: Three times a day (TID) | ORAL | 0 refills | Status: DC | PRN

## 2016-11-19 MED ORDER — LIDOCAINE HCL (CARDIAC) 20 MG/ML IV SOLN
INTRAVENOUS | Status: AC
  Filled 2016-11-19: qty 5

## 2016-11-19 MED ORDER — LACTATED RINGERS IV SOLN
INTRAVENOUS | Status: DC
  Administered 2016-11-19: 125 mL/h via INTRAVENOUS
  Administered 2016-11-19: 10:00:00 via INTRAVENOUS

## 2016-11-19 MED ORDER — PROPOFOL 10 MG/ML IV BOLUS
INTRAVENOUS | Status: DC | PRN
  Administered 2016-11-19: 20 mg via INTRAVENOUS
  Administered 2016-11-19: 180 mg via INTRAVENOUS

## 2016-11-19 MED ORDER — LACTATED RINGERS IV SOLN: INTRAVENOUS | Status: DC

## 2016-11-19 MED ORDER — ONDANSETRON HCL 4 MG/2ML IJ SOLN
4.0000 mg | Freq: Once | INTRAMUSCULAR | Status: AC | PRN
  Administered 2016-11-19: 4 mg via INTRAVENOUS

## 2016-11-19 MED ORDER — FENTANYL CITRATE (PF) 100 MCG/2ML IJ SOLN
INTRAMUSCULAR | Status: DC | PRN
  Administered 2016-11-19: 25 ug via INTRAVENOUS
  Administered 2016-11-19: 100 ug via INTRAVENOUS
  Administered 2016-11-19 (×2): 50 ug via INTRAVENOUS
  Administered 2016-11-19: 25 ug via INTRAVENOUS
  Administered 2016-11-19 (×2): 50 ug via INTRAVENOUS

## 2016-11-19 MED ORDER — ONDANSETRON HCL 4 MG/2ML IJ SOLN
INTRAMUSCULAR | Status: AC
  Filled 2016-11-19: qty 2

## 2016-11-19 MED ORDER — ROCURONIUM BROMIDE 100 MG/10ML IV SOLN
INTRAVENOUS | Status: DC | PRN
  Administered 2016-11-19: 10 mg via INTRAVENOUS
  Administered 2016-11-19: 40 mg via INTRAVENOUS
  Administered 2016-11-19: 10 mg via INTRAVENOUS
  Administered 2016-11-19: 5 mg via INTRAVENOUS
  Administered 2016-11-19: 10 mg via INTRAVENOUS

## 2016-11-19 MED ORDER — SODIUM CHLORIDE 0.9 % IV SOLN
3.0000 g | INTRAVENOUS | Status: AC
  Administered 2016-11-19: 3 g via INTRAVENOUS
  Filled 2016-11-19: qty 3

## 2016-11-19 MED ORDER — BUPIVACAINE HCL (PF) 0.25 % IJ SOLN
INTRAMUSCULAR | Status: AC
  Filled 2016-11-19: qty 30

## 2016-11-19 SURGICAL SUPPLY — 62 items
ADH SKN CLS APL DERMABOND .7 (GAUZE/BANDAGES/DRESSINGS) ×1
BRR ADH 6X5 SEPRAFILM 1 SHT (MISCELLANEOUS)
CABLE HIGH FREQUENCY MONO STRZ (ELECTRODE) ×1 IMPLANT
CATH ROBINSON RED A/P 16FR (CATHETERS) IMPLANT
CLOTH BEACON ORANGE TIMEOUT ST (SAFETY) ×2 IMPLANT
DERMABOND ADVANCED (GAUZE/BANDAGES/DRESSINGS) ×1
DERMABOND ADVANCED .7 DNX12 (GAUZE/BANDAGES/DRESSINGS) ×1 IMPLANT
DEVICE TROCAR PUNCTURE CLOSURE (ENDOMECHANICALS) ×4 IMPLANT
DRAPE POUCH INSTRU U-SHP 10X18 (DRAPES) ×1 IMPLANT
ELECT NDL TIP 2.8 STRL (NEEDLE) IMPLANT
ELECT NEEDLE TIP 2.8 STRL (NEEDLE) IMPLANT
ELECT REM PT RETURN 9FT ADLT (ELECTROSURGICAL) ×2
ELECTRODE REM PT RTRN 9FT ADLT (ELECTROSURGICAL) ×1 IMPLANT
FILTER SMOKE EVAC LAPAROSHD (FILTER) IMPLANT
GAUZE SPONGE 4X4 16PLY XRAY LF (GAUZE/BANDAGES/DRESSINGS) ×2 IMPLANT
GLOVE BIO SURGEON STRL SZ8 (GLOVE) ×5 IMPLANT
GLOVE BIOGEL PI IND STRL 7.0 (GLOVE) ×2 IMPLANT
GLOVE BIOGEL PI IND STRL 8.5 (GLOVE) ×1 IMPLANT
GLOVE BIOGEL PI INDICATOR 7.0 (GLOVE) ×2
GLOVE BIOGEL PI INDICATOR 8.5 (GLOVE) ×1
GLOVE INDICATOR 8.5 STRL (GLOVE) ×2 IMPLANT
GOWN STRL REUS W/TWL LRG LVL3 (GOWN DISPOSABLE) ×6 IMPLANT
LEGGING LITHOTOMY PAIR STRL (DRAPES) ×2 IMPLANT
NEEDLE INSUFFLATION 120MM (ENDOMECHANICALS) ×2 IMPLANT
NS IRRIG 1000ML POUR BTL (IV SOLUTION) ×2 IMPLANT
PACK ABDOMINAL GYN (CUSTOM PROCEDURE TRAY) ×2 IMPLANT
PACK LAPAROSCOPY BASIN (CUSTOM PROCEDURE TRAY) ×2 IMPLANT
PACK TRENDGUARD 450 HYBRID PRO (MISCELLANEOUS) IMPLANT
PACK TRENDGUARD 600 HYBRD PROC (MISCELLANEOUS) IMPLANT
PAD OB MATERNITY 4.3X12.25 (PERSONAL CARE ITEMS) ×2 IMPLANT
PENCIL BUTTON HOLSTER BLD 10FT (ELECTRODE) IMPLANT
PROTECTOR NERVE ULNAR (MISCELLANEOUS) ×4 IMPLANT
RETRACTOR LAPSCP 12X46 CVD (ENDOMECHANICALS) ×1 IMPLANT
RTRCTR LAPSCP 12X46 CVD (ENDOMECHANICALS) ×2
SEALER TISSUE G2 CVD JAW 35 (ENDOMECHANICALS) IMPLANT
SEALER TISSUE G2 CVD JAW 45CM (ENDOMECHANICALS)
SEPRAFILM MEMBRANE 5X6 (MISCELLANEOUS) IMPLANT
SET IRRIG TUBING LAPAROSCOPIC (IRRIGATION / IRRIGATOR) ×2 IMPLANT
SHEARS HARMONIC ACE PLUS 36CM (ENDOMECHANICALS) ×1 IMPLANT
SLEEVE XCEL OPT CAN 5 100 (ENDOMECHANICALS) ×4 IMPLANT
SUT MERSILENE 5MM BP 1 12 (SUTURE) ×3 IMPLANT
SUT MNCRL AB 4-0 PS2 18 (SUTURE) ×2 IMPLANT
SUT MON AB 4-0 PS1 27 (SUTURE) ×2 IMPLANT
SUT PROLENE 1 CT (SUTURE) ×1 IMPLANT
SUT SILK 2 0 FSL 18 (SUTURE) ×4 IMPLANT
SUT VIC AB 2-0 CT1 27 (SUTURE)
SUT VIC AB 2-0 CT1 TAPERPNT 27 (SUTURE) ×2 IMPLANT
SUT VIC AB 2-0 UR6 27 (SUTURE) ×2 IMPLANT
SUT VICRYL 0 TIES 12 18 (SUTURE) IMPLANT
SYR 20CC LL (SYRINGE) IMPLANT
SYR 50ML LL SCALE MARK (SYRINGE) IMPLANT
SYR 50ML SLIP (SYRINGE) IMPLANT
SYR BULB IRRIGATION 50ML (SYRINGE) ×1 IMPLANT
SYR TOOMEY 50ML (SYRINGE) IMPLANT
TOWEL OR 17X24 6PK STRL BLUE (TOWEL DISPOSABLE) ×4 IMPLANT
TRAY FOLEY CATH SILVER 14FR (SET/KITS/TRAYS/PACK) ×2 IMPLANT
TRENDGUARD 450 HYBRID PRO PACK (MISCELLANEOUS) ×2
TRENDGUARD 600 HYBRID PROC PK (MISCELLANEOUS)
TROCAR OPTI TIP 5M 100M (ENDOMECHANICALS) ×2 IMPLANT
TROCAR XCEL 12X100 BLDLESS (ENDOMECHANICALS) ×2 IMPLANT
TROCAR XCEL DIL TIP R 11M (ENDOMECHANICALS) IMPLANT
WARMER LAPAROSCOPE (MISCELLANEOUS) ×2 IMPLANT

## 2016-11-19 NOTE — H&P (Addendum)
Kelly Mcclure is a 36 y.o. female [place], originally referred to me by Dr. Ronita Hipps, for L/S, TAC.  She has history of cervical conization for microinvasive cervical carcinoma. She is now [redacted] wk pregnant and wishes TAC instead of elective TVC to prevent loss due to cervical pregnancy.  Pertinent Gynecological History:  Contraception: none DES exposure: denies Blood transfusions: none Sexually transmitted diseases: no past history Last mammogram: normal Last pap: normal     Menstrual History: Menarche age: 86     Past Medical History:  Diagnosis Date  . Cancer (HCC)    hx of cervical cancer  . Complication of anesthesia    after some surgeries - pt woke up with migraine  . GERD (gastroesophageal reflux disease)    with pregnancy  . H/O radioactive iodine thyroid ablation 12/2009  . 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 History of kidney stones 2011  . Hypothyroidism   . Migraines   . Seizure disorder ([place])    Diagnosed at age 25  . Seizures ([place])    clonic tonic seizures - will "space out " for a very brief moment.  take medication for seizures. HX OF MAJOR SEIZURES - LOOSING CONSCIOUSNESS-- APPROX 10 TO 12 YRS AGO- NEUROLOGIST IS DR. Raliegh Ip. MILLER [place], absent seizures  . 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 ADENOIDECTOMY  1995  . cold knife conization    . DILATION AND CURETTAGE OF UTERUS  2009  . DILATION AND CURETTAGE OF UTERUS N/A 03/01/2013   Procedure: CERVICAL DILATATION AND ENDOCERVICAL CURRETAGE AND VAAGINAL BIOPIES;  Surgeon: Alvino Chapel, MD;  Location: [facility];  Service: Gynecology;  Laterality: N/A;  . LEEP  02/25/2010  . LEEP  07/25/11  . MOUTH SURGERY    . NASAL SEPTUM SURGERY  2006   Repair of deviated septum  . SIGMOIDOSCOPY    . TONSILLECTOMY  1995             Family History  Problem Relation Age of Onset  .  Leukemia Other   . Diabetes Other   . Cervical cancer Mother   . Diabetes Mother   . Skin cancer Mother   . Hyperlipidemia Mother   . Hypertension Father   . Asthma Father   . Skin cancer Maternal Aunt   . Multiple myeloma Maternal Aunt   . Testicular cancer Maternal Uncle   . Hyperlipidemia Maternal Grandmother   . Prostate cancer Maternal Grandfather    No hereditary disease.  No cancer of breast, ovary, uterus. No cutaneous leiomyomatosis or renal cell carcinoma.  Social History   Social History  . Marital status: Single    Spouse name: N/A  . Number of children: N/A  . Years of education: N/A   Occupational History  . Not on file.   Social History Main Topics  . Smoking status: Never Smoker  . Smokeless tobacco: Never Used  . Alcohol use No     Comment: Rare  . Drug use: No  . Sexual activity: Not on file   Other Topics Concern  . Not on file   Social History Narrative  . No narrative on file    Allergies  Allergen Reactions  . Sulfa Antibiotics     Tongue swelling, throat irritated  .  Adhesive [Tape]     Some bandaids cause skin irritation  . Other Swelling    Magic mouth wash/ causes swelling of the tongue  . Relpax [Eletriptan Hydrobromide]     Stroke-like symptoms  . Ultram [Tramadol Hcl]     Seizure  . Ultram [Tramadol] Other (See Comments)    seizures    No current facility-administered medications on file prior to encounter.    Current Outpatient Prescriptions on File Prior to Encounter  Medication Sig Dispense Refill  . lamoTRIgine (LAMICTAL) 150 MG tablet Take 450 mg by mouth 2 (two) times daily. TAKES 3 TABLETS TWICE DAILY    . levETIRAcetam (KEPPRA) 500 MG tablet Take 1,500 mg by mouth 2 (two) times daily. TAKES 3 TABLETS TWICE DAILY       Review of Systems  Constitutional: Negative.   HENT: Negative.   Eyes: Negative.   Respiratory: Negative.   Cardiovascular: Negative.   Gastrointestinal: Negative.   Genitourinary: Negative.    Musculoskeletal: Negative.   Skin: Negative.   Neurological: Negative.   Endo/Heme/Allergies: Negative.   Psychiatric/Behavioral: Negative.      Physical Exam  BP 110/81   Pulse 86   Temp 98.3 F (36.8 C) (Oral)   Resp 16   LMP 08/31/2016 (Exact Date)   SpO2 98%  Constitutional: She is oriented to person, place, and time. She appears well-developed and well-nourished.  HENT:  Head: Normocephalic and atraumatic.  Nose: Nose normal.  Mouth/Throat: Oropharynx is clear and moist. No oropharyngeal exudate.  Eyes: Conjunctivae normal and EOM are normal. Pupils are equal, round, and reactive to light. No scleral icterus.  Neck: Normal range of motion. Neck supple. No tracheal deviation present. No thyromegaly present.  Cardiovascular: Normal rate.   Respiratory: Effort normal and breath sounds normal.  GI: Soft. Bowel sounds are normal. She exhibits no distension and no mass. There is no tenderness.  Lymphadenopathy:    She has no cervical adenopathy.  Neurological: She is alert and oriented to person, place, and time. She has normal reflexes.  Skin: Skin is warm.  Psychiatric: She has a normal mood and affect. Her behavior is normal. Judgment and thought content normal.       Assessment/Plan:  IUP at 11 weeks, S/P cervical conization, requesting L/S transabdominal cerclage (in place of TVC) B/R/A of TAC were reviewed with pt.

## 2016-11-19 NOTE — Op Note (Signed)
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 Endotracheal  Surgeon:Ayasha Ellingsen Kerin Perna, MD  Complications: None  Estimated blood loss: 200 cc Findings: On exam under anesthesia the cervix was closed and deviated to the left. On laparoscopy the liver, gallbladder, and appendix were normal.  The uterus was gravid and retroverted and retroflexed, both tubes and ovaries appeared normal.  Intraoperative ultrasound transabdominally and transvaginally showed a singleton intrauterine pregnancy at length consistent with 11 weeks. There was fetal cardiac activity and movements.  Description of the procedure: The patient was placed in dorsal supine position and general endotracheal anesthesia was given. She was then placed in lithotomy position and the abdomen was prepped and draped inside manner. A Foley catheter was inserted into the bladder. An intraumbilical 5 mm vertical skin incision was made after preemptive anesthesia of all toes incisions with 0.25% bupivacaine. A Verress needle was inserted. A pneumoperitoneum was created with carbon dioxide. A 5 mm trocar and then a corresponding laparoscope with 30 angle was inserted and video laparoscopy was started.  Under direct visualization 2 more 5 mm lower quadrant incisions were made and corresponding trochars were placed. A fifth 12 mm trocar was placed in the left upper quadrant for the Endopaddle retractor.  Above findings were noted. A #5 Mersilene tape was prepared by cutting the needles off just at the swaged and and creating a loop of 2-0 silk at each end to allow carrying the end of the Mersilene  tape through the tissue during the cerclage. This Mersilene tape was then dropped into the posterior cul-de-sac. The patient was placed in Trendelenburg position and the uterus was gently pressed posteriorly and superiorly with the EndoPaddle retractor while the bladder flap was created with the electrosurgical needle, set at 7 W cutting current. After blunt and sharp dissection the cervical used junction could be well visualized, despite the adiposity of the viscera and the visceral peritoneum. Next a stab wound incision was made 1 cm from the midline immediately suprapubically and an Endo Close ligature carrier was passed through the abdominal wall and pain and at the right side of the cervical isthmic junction. At this point of passing the tip of the Endo Close through the parametrium the uterus was hammocked on the EndoPaddle device and lifted anteriorly, allowing visualization of the posterior lower uterine segment. The tip of the Endo Close device was brought out posteriorly just above the medial insertion point of the right uterosacral ligaments onto the uterus. 1 end of the Mersilene tape was grasped with the Endo Close device and brought out anteriorly. The same steps were repeated with a new Endo Close device on the left anterior aspect of the cervico-isthmic junction, immediately medial to the uterine blood vessels, and the other end of the Mersilene tape was grasped with the ligature carrier device and brought out anteriorly. Intraoperative transvaginal ultrasound guidance confirmed correct extra chorionic placement of the cerclage but there resulting cervical length was only 1.9-2.0 cm. The fetal viability was also confirmed.  We decided to place a second cerclage suture with Mersilene tape since there was cervical length to be gained.  Same steps were repeated with a new Mersilene, placing the Endoclose about 1 cm above the previous points of placement.  When the  cerclage suture was tied with a  surgeon's knot followed by 4 additional knots under transvaginal ultrasound guidance again the correct placement was confirmed. The resulting cervical cerclage produced a cervical length of 3.35 cm. The AP and transverse diameters of the cerclage by ultrasound was 22 and 23 mm respectively. The ends of the Mersilene tape was trimmed off and removed from the pelvis. The lower TAC was also tied tightly and trimmed off.  Good hemostasis was insured. The pelvis was copiously irrigated and aspirated. The gas was allowed to escape. A 0 Vicryl deep subcutaneous suture was placed on the 12 mm incision. 12 mm skin incisions was approximated with 4-0 Monocryl in subcuticular stitches. Dermabond was applied to the rest of the incisions. The patient tolerated the procedure well and was transferred to recovery room in satisfactory condition.   Governor Specking, MD

## 2016-11-19 NOTE — Transfer of Care (Signed)
Immediate Anesthesia Transfer of Care Note  Patient: Kelly Mcclure  Procedure(s) Performed: Procedure(s) with comments: LAPAROSCOPIC TRANSABDOMINAL CERVICAL-ISTHMUSx2 (N/A) - with ultrasound guidance. Start laparoscopically HOLD OPEN ABDOMINAL ITEMS  CERCLAGE LAPAROSCOPIC ABDOMINAL (N/A)  Patient Location: PACU  Anesthesia Type:General  Level of Consciousness: sedated  Airway & Oxygen Therapy: Patient Spontanous Breathing  Post-op Assessment: Report given to RN  Post vital signs: Reviewed and stable  Last Vitals:  Vitals:   11/19/16 0812 11/19/16 1315  BP: 110/81   Pulse: 86   Resp: 16   Temp: 36.8 C 36.6 C    Last Pain:  Vitals:   11/19/16 0812  TempSrc: Oral         Complications: No apparent anesthesia complications

## 2016-11-19 NOTE — Discharge Instructions (Signed)
DISCHARGE INSTRUCTIONS: Laparoscopy  The following instructions have been prepared to help you care for yourself upon your return home today.  Wound care:  Do not get the incision wet for the first 24 hours. The incision should be kept clean and dry.  The Band-Aids or dressings may be removed the day after surgery.  Should the incision become sore, red, and swollen after the first week, check with your doctor.  Personal hygiene:  Shower the day after your procedure.  Activity and limitations:  Do NOT drive or operate any equipment today.  Do NOT lift anything more than 15 pounds for 2-3 weeks after surgery.  Do NOT rest in bed all day.  Walking is encouraged. Walk each day, starting slowly with 5-minute walks 3 or 4 times a day. Slowly increase the length of your walks.  Walk up and down stairs slowly.  Do NOT do strenuous activities, such as golfing, playing tennis, bowling, running, biking, weight lifting, gardening, mowing, or vacuuming for 2-4 weeks. Ask your doctor when it is okay to start.  Diet: Eat a light meal as desired this evening. You may resume your usual diet tomorrow.  Return to work: This is dependent on the type of work you do. For the most part you can return to a desk job within a week of surgery. If you are more active at work, please discuss this with your doctor.  What to expect after your surgery: You may have a slight burning sensation when you urinate on the first day. You may have a very small amount of blood in the urine. Expect to have a small amount of vaginal discharge/light bleeding for 1-2 weeks. It is not unusual to have abdominal soreness and bruising for up to 2 weeks. You may be tired and need more rest for about 1 week. You may experience shoulder pain for 24-72 hours. Lying flat in bed may relieve it.  Call your doctor for any of the following:  Develop a fever of 100.4 or greater  Inability to urinate 6 hours after discharge from  hospital  Severe pain not relieved by pain medications  Persistent of heavy bleeding at incision site  Redness or swelling around incision site after a week  Increasing nausea or vomiting  Patient Signature________________________________________ Nurse Signature_________________________________________Cervical Cerclage, Care After This sheet gives you information about how to care for yourself after your procedure. Your health care provider may also give you more specific instructions. If you have problems or questions, contact your health care provider. What can I expect after the procedure? After your procedure, it is common to have:  Cramping in your abdomen.  Mucus discharge for several days.  Painful urination (dysuria).  Small drops of blood coming from your vagina (spotting). Follow these instructions at home:  Follow instructions from your health care provider about bed rest, if this applies. You may need to be on bed rest for up to 3 days.  Take over-the-counter and prescription medicines only as told by your health care provider.  Do not drive or use heavy machinery while taking prescription pain medicine.  Keep track of your vaginal discharge and watch for any changes. If you notice changes, tell your health care provider.  Avoid physical activities and exercise until your health care provider approves. Ask your health care provider what activities are safe for you.  Until your health care provider approves:  Do not douche.  Do not have sexual intercourse.  Keep all pre-birth (prenatal) visits and  all follow-up visits as told by your health care provider. This is important. You will probably have weekly visits to have your cervix checked, and you may need an ultrasound. Contact a health care provider if:  You have abnormal or bad-smelling vaginal discharge, such as clots.  You develop a rash on your skin. This may look like redness and swelling.  You become  light-headed or feel like you are going to faint.  You have abdominal pain that does not get better with medicine.  You have persistent nausea or vomiting. Get help right away if:  You have vaginal bleeding that is heavier or more frequent than spotting.  You are leaking fluid or have a gush of fluid from your vagina (your water breaks).  You have a fever or chills.  You faint.  You have uterine contractions. These may feel like:  A back ache.  Lower abdominal pain.  Mild cramps, similar to menstrual cramps.  Tightening or pressure in your abdomen.  You think that your baby is not moving as much as usual, or you cannot feel your baby move.  You have chest pain.  You have shortness of breath. This information is not intended to replace advice given to you by your health care provider. Make sure you discuss any questions you have with your health care provider. Document Released: 05/11/2013 Document Revised: 03/19/2016 Document Reviewed: 02/22/2016 Elsevier Interactive Patient Education  2017 [employer].

## 2016-11-19 NOTE — OR Nursing (Signed)
Family updated at 1240pm by C.Erling Cruz

## 2016-11-19 NOTE — Anesthesia Procedure Notes (Signed)
Procedure Name: Intubation Date/Time: 11/19/2016 9:50 AM Performed by: Casimer Lanius A Pre-anesthesia Checklist: Patient identified, Emergency Drugs available, Suction available and Patient being monitored Patient Re-evaluated:Patient Re-evaluated prior to inductionOxygen Delivery Method: Circle system utilized and Simple face mask Preoxygenation: Pre-oxygenation with 100% oxygen Intubation Type: IV induction Ventilation: Mask ventilation without difficulty Laryngoscope Size: Mac and 3 Grade View: Grade II Tube type: Oral Tube size: 7.0 mm Number of attempts: 1 Airway Equipment and Method: Stylet Placement Confirmation: ETT inserted through vocal cords under direct vision,  positive ETCO2 and breath sounds checked- equal and bilateral Secured at: 21 (right lip) cm Tube secured with: Tape Dental Injury: Teeth and Oropharynx as per pre-operative assessment

## 2016-11-20 ENCOUNTER — Encounter (HOSPITAL_COMMUNITY): Payer: Self-pay | Admitting: Obstetrics and Gynecology

## 2016-11-20 NOTE — Anesthesia Postprocedure Evaluation (Signed)
Anesthesia Post Note  Patient: Kelly Mcclure  Procedure(s) Performed: Procedure(s) (LRB): LAPAROSCOPIC TRANSABDOMINAL CERVICAL-ISTHMUSx2 (N/A) CERCLAGE LAPAROSCOPIC ABDOMINAL (N/A)  Patient location during evaluation: PACU Anesthesia Type: General Level of consciousness: awake and alert Pain management: pain level controlled Vital Signs Assessment: post-procedure vital signs reviewed and stable Respiratory status: spontaneous breathing, nonlabored ventilation, respiratory function stable and patient connected to nasal cannula oxygen Cardiovascular status: blood pressure returned to baseline and stable Postop Assessment: no signs of nausea or vomiting Anesthetic complications: no       Last Vitals:  Vitals:   11/19/16 1445 11/19/16 1500  BP: 116/71 130/81  Pulse: 97 (!) 108  Resp: 18 17  Temp:  36.7 C    Last Pain:  Vitals:   11/19/16 1430  TempSrc:   PainSc: 4                  Montez Hageman

## 2016-11-24 ENCOUNTER — Encounter (HOSPITAL_COMMUNITY): Payer: Self-pay | Admitting: Obstetrics and Gynecology

## 2016-12-11 ENCOUNTER — Encounter: Payer: Self-pay | Admitting: Internal Medicine

## 2016-12-11 NOTE — Progress Notes (Signed)
Received Labs from ObGyn: 12/10/2016: TSH 0.54 (0.4-4), free T3 3.3 (2-4.4), free T4 1.79 (0.82-1.77) - on LT4 175 mcg  Prev: 11/11/2016: TSH 0.58 (0.4-4), free T3 3.3 (2-4.4), free T4 1.79 (0.82-1.77) - on LT4 175 mcg  I will advise her to try to decrease the dose of LT4 to 175 mcg alternating with 150 mcg every other day and repeat labs in one month.

## 2017-01-01 ENCOUNTER — Other Ambulatory Visit: Payer: Self-pay | Admitting: Internal Medicine

## 2017-01-16 ENCOUNTER — Encounter: Payer: Self-pay | Admitting: Internal Medicine

## 2017-01-16 ENCOUNTER — Other Ambulatory Visit: Payer: Self-pay | Admitting: Internal Medicine

## 2017-01-16 LAB — TSH: TSH: 5.27 (ref 0.41–5.90)

## 2017-01-16 MED ORDER — SYNTHROID 175 MCG PO TABS: 175.0000 ug | ORAL_TABLET | Freq: Every day | ORAL | 3 refills | Status: DC

## 2017-03-06 ENCOUNTER — Encounter: Payer: Self-pay | Admitting: Internal Medicine

## 2017-03-14 ENCOUNTER — Encounter (HOSPITAL_COMMUNITY): Payer: Self-pay | Admitting: Emergency Medicine

## 2017-03-14 ENCOUNTER — Observation Stay (HOSPITAL_COMMUNITY)
Admission: EM | Admit: 2017-03-14 | Discharge: 2017-03-16 | Disposition: A | Discharge status: Home / Self Care | Payer: 59 | Attending: Obstetrics and Gynecology | Admitting: Obstetrics and Gynecology

## 2017-03-14 DIAGNOSIS — Z8042 Family history of malignant neoplasm of prostate: Secondary | ICD-10-CM | POA: Diagnosis not present

## 2017-03-14 DIAGNOSIS — O99283 Endocrine, nutritional and metabolic diseases complicating pregnancy, third trimester: Secondary | ICD-10-CM | POA: Insufficient documentation

## 2017-03-14 DIAGNOSIS — Z79899 Other long term (current) drug therapy: Secondary | ICD-10-CM | POA: Insufficient documentation

## 2017-03-14 DIAGNOSIS — E876 Hypokalemia: Secondary | ICD-10-CM | POA: Diagnosis present

## 2017-03-14 DIAGNOSIS — G40909 Epilepsy, unspecified, not intractable, without status epilepticus: Secondary | ICD-10-CM | POA: Diagnosis not present

## 2017-03-14 DIAGNOSIS — Z808 Family history of malignant neoplasm of other organs or systems: Secondary | ICD-10-CM | POA: Insufficient documentation

## 2017-03-14 DIAGNOSIS — Z8249 Family history of ischemic heart disease and other diseases of the circulatory system: Secondary | ICD-10-CM | POA: Diagnosis not present

## 2017-03-14 DIAGNOSIS — O26893 Other specified pregnancy related conditions, third trimester: Secondary | ICD-10-CM | POA: Diagnosis not present

## 2017-03-14 DIAGNOSIS — R05 Cough: Secondary | ICD-10-CM | POA: Insufficient documentation

## 2017-03-14 DIAGNOSIS — O99353 Diseases of the nervous system complicating pregnancy, third trimester: Secondary | ICD-10-CM | POA: Diagnosis not present

## 2017-03-14 DIAGNOSIS — Z3A28 28 weeks gestation of pregnancy: Secondary | ICD-10-CM | POA: Insufficient documentation

## 2017-03-14 DIAGNOSIS — Z882 Allergy status to sulfonamides status: Secondary | ICD-10-CM | POA: Insufficient documentation

## 2017-03-14 DIAGNOSIS — K219 Gastro-esophageal reflux disease without esophagitis: Secondary | ICD-10-CM | POA: Diagnosis not present

## 2017-03-14 DIAGNOSIS — Z8043 Family history of malignant neoplasm of testis: Secondary | ICD-10-CM | POA: Diagnosis not present

## 2017-03-14 DIAGNOSIS — Z888 Allergy status to other drugs, medicaments and biological substances status: Secondary | ICD-10-CM | POA: Diagnosis not present

## 2017-03-14 DIAGNOSIS — Z825 Family history of asthma and other chronic lower respiratory diseases: Secondary | ICD-10-CM | POA: Insufficient documentation

## 2017-03-14 DIAGNOSIS — O99352 Diseases of the nervous system complicating pregnancy, second trimester: Secondary | ICD-10-CM

## 2017-03-14 DIAGNOSIS — Z833 Family history of diabetes mellitus: Secondary | ICD-10-CM | POA: Insufficient documentation

## 2017-03-14 DIAGNOSIS — Z91048 Other nonmedicinal substance allergy status: Secondary | ICD-10-CM | POA: Diagnosis not present

## 2017-03-14 DIAGNOSIS — Z8541 Personal history of malignant neoplasm of cervix uteri: Secondary | ICD-10-CM | POA: Diagnosis not present

## 2017-03-14 DIAGNOSIS — Z8049 Family history of malignant neoplasm of other genital organs: Secondary | ICD-10-CM | POA: Diagnosis not present

## 2017-03-14 DIAGNOSIS — Z806 Family history of leukemia: Secondary | ICD-10-CM | POA: Insufficient documentation

## 2017-03-14 DIAGNOSIS — G40919 Epilepsy, unspecified, intractable, without status epilepticus: Secondary | ICD-10-CM | POA: Diagnosis present

## 2017-03-14 DIAGNOSIS — R569 Unspecified convulsions: Secondary | ICD-10-CM

## 2017-03-14 LAB — COMPREHENSIVE METABOLIC PANEL
ALT: 16 U/L (ref 14–54)
AST: 24 U/L (ref 15–41)
Albumin: 2.9 g/dL — ABNORMAL LOW (ref 3.5–5.0)
Alkaline Phosphatase: 58 U/L (ref 38–126)
Anion gap: 8 (ref 5–15)
BILIRUBIN TOTAL: 0.3 mg/dL (ref 0.3–1.2)
CHLORIDE: 106 mmol/L (ref 101–111)
CO2: 20 mmol/L — ABNORMAL LOW (ref 22–32)
CREATININE: 0.56 mg/dL (ref 0.44–1.00)
Calcium: 8.8 mg/dL — ABNORMAL LOW (ref 8.9–10.3)
GFR calc Af Amer: >60 mL/min (ref >60)
Glucose, Bld: 133 mg/dL — ABNORMAL HIGH (ref 65–99)
Potassium: 3 mmol/L — ABNORMAL LOW (ref 3.5–5.1)
Sodium: 134 mmol/L — ABNORMAL LOW (ref 135–145)
TOTAL PROTEIN: 6.1 g/dL — AB (ref 6.5–8.1)

## 2017-03-14 LAB — URINALYSIS, ROUTINE W REFLEX MICROSCOPIC
BILIRUBIN URINE: NEGATIVE
GLUCOSE, UA: 50 mg/dL — AB
Ketones, ur: NEGATIVE mg/dL
NITRITE: NEGATIVE
PROTEIN: 100 mg/dL — AB
SPECIFIC GRAVITY, URINE: 1.008 (ref 1.005–1.030)
pH: 6 (ref 5.0–8.0)

## 2017-03-14 LAB — CBC WITH DIFFERENTIAL/PLATELET
BASOS ABS: 0 10*3/uL (ref 0.0–0.1)
Basophils Relative: 0 %
Eosinophils Absolute: 0.1 10*3/uL (ref 0.0–0.7)
Eosinophils Relative: 1 %
HEMATOCRIT: 37.9 % (ref 36.0–46.0)
Hemoglobin: 12.3 g/dL (ref 12.0–15.0)
LYMPHS PCT: 26 %
Lymphs Abs: 2.7 10*3/uL (ref 0.7–4.0)
MCH: 25.7 pg — ABNORMAL LOW (ref 26.0–34.0)
MCHC: 32.5 g/dL (ref 30.0–36.0)
MCV: 79.3 fL (ref 78.0–100.0)
Monocytes Absolute: 0.7 10*3/uL (ref 0.1–1.0)
Monocytes Relative: 7 %
NEUTROS ABS: 7 10*3/uL (ref 1.7–7.7)
Neutrophils Relative %: 66 %
Platelets: 177 10*3/uL (ref 150–400)
RBC: 4.78 MIL/uL (ref 3.87–5.11)
RDW: 14 % (ref 11.5–15.5)
WBC: 10.5 10*3/uL (ref 4.0–10.5)

## 2017-03-14 LAB — URIC ACID: URIC ACID, SERUM: 6.6 mg/dL (ref 2.3–6.6)

## 2017-03-14 MED ORDER — POTASSIUM CHLORIDE 10 MEQ/100ML IV SOLN
10.0000 meq | INTRAVENOUS | Status: AC
  Administered 2017-03-15 (×3): 10 meq via INTRAVENOUS
  Filled 2017-03-14 (×3): qty 100

## 2017-03-14 MED ORDER — HYDRALAZINE HCL 20 MG/ML IJ SOLN: 10.0000 mg | Freq: Once | INTRAMUSCULAR | Status: DC | PRN

## 2017-03-14 MED ORDER — LABETALOL HCL 5 MG/ML IV SOLN: 20.0000 mg | INTRAVENOUS | Status: DC | PRN

## 2017-03-14 NOTE — ED Provider Notes (Signed)
[facility] DEPT Provider Note   CSN: [id number] Arrival date & time: 03/14/17  2155     History   Chief Complaint Chief Complaint  Patient presents with  . Seizures    HPI Kelly Mcclure is a 36 y.o. female.  HPI Patient is G3 P0 at [redacted] weeks gestation presents with witnessed loss tonic-clonic seizure lasting roughly 2-3 minutes. Had persistent phase. Admits to biting the right side of her tongue. No urinary incontinence. Denies abdominal pain, nausea or vomiting. No vaginal bleeding. Patient has history of seizures and is on Keppra and Lamictal. States she's been compliant with medications. Has frequent Seizures but States it has Been Multiple Years since She Had a Tonic-Clonic Seizure. She's Had No, Complications with This Pregnancy. Past Medical History:  Diagnosis Date  . Cancer (HCC)    hx of cervical cancer  . Complication of anesthesia    after some surgeries - pt woke up with migraine  . GERD (gastroesophageal reflux disease)    with pregnancy  . H/O radioactive iodine thyroid ablation 12/2009  . 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 History of kidney stones 2011  . Hypothyroidism   . Migraines   . Seizure disorder ([place])    Diagnosed at age 72  . Seizures ([place])    clonic tonic seizures - will "space out " for a very brief moment.  take medication for seizures. HX OF MAJOR SEIZURES - LOOSING CONSCIOUSNESS-- APPROX 10 TO 12 YRS AGO- NEUROLOGIST IS DR. Raliegh Ip. [facility] IN [place], absent seizures  . 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 Upper airway cough syndrome 09/02/2016  . Cervix cancer ([place]) 10/01/2012  . 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 ABDOMINAL CERCLAGE N/A 11/19/2016   Procedure: LAPAROSCOPIC TRANSABDOMINAL CERVICAL-ISTHMUSx2;  Surgeon: Governor Specking, MD;  Location:  [facility];  Service: Gynecology;  Laterality: N/A;  with ultrasound guidance. Start laparoscopically HOLD OPEN ABDOMINAL ITEMS   . ADENOIDECTOMY  1995  . CERCLAGE LAPAROSCOPIC ABDOMINAL N/A 11/19/2016   Procedure: CERCLAGE LAPAROSCOPIC ABDOMINAL;  Surgeon: Governor Specking, MD;  Location: [facility];  Service: Gynecology;  Laterality: N/A;  . cold knife conization    . DILATION AND CURETTAGE OF UTERUS  2009  . DILATION AND CURETTAGE OF UTERUS N/A 03/01/2013   Procedure: CERVICAL DILATATION AND ENDOCERVICAL CURRETAGE AND VAAGINAL BIOPIES;  Surgeon: Alvino Chapel, MD;  Location: [facility];  Service: Gynecology;  Laterality: N/A;  . LEEP  02/25/2010  . LEEP  07/25/11  . MOUTH SURGERY    . NASAL SEPTUM SURGERY  2006   Repair of deviated septum  . SIGMOIDOSCOPY    . TONSILLECTOMY  1995    OB History    Gravida Para Term Preterm AB Living   1             SAB TAB Ectopic Multiple Live Births                   Home Medications    Prior to Admission medications   Medication Sig Start Date End Date Taking? Authorizing Provider  lamoTRIgine (LAMICTAL) 150 MG tablet Take 450 mg by mouth 2 (two) times daily. TAKES 3 TABLETS TWICE DAILY   Yes [provider]  levETIRAcetam (KEPPRA) 500 MG tablet Take 1,500 mg by mouth 2 (two) times daily.  TAKES 3 TABLETS TWICE DAILY   Yes [provider]  SYNTHROID 175 MCG tablet Take 1 tablet (175 mcg total) by mouth daily before breakfast. 01/16/17  Yes Philemon Kingdom, MD  indomethacin (INDOCIN) 25 MG capsule Take 1 capsule (25 mg total) by mouth 3 (three) times daily. Patient not taking: Reported on 03/14/2017 11/19/16   Governor Specking, MD  ondansetron (ZOFRAN) 4 MG tablet Take 1 tablet (4 mg total) by mouth every 8 (eight) hours as needed for nausea or vomiting. Patient not taking: Reported on 03/14/2017 11/19/16   Governor Specking, MD  oxyCODONE-acetaminophen (PERCOCET) 7.5-325 MG tablet Take 1 tablet by mouth every 4 (four) hours as  needed. Patient not taking: Reported on 03/14/2017 11/19/16   Governor Specking, MD  pantoprazole (PROTONIX) 40 MG tablet TAKE 1 TABLET (40 MG TOTAL) BY MOUTH DAILY. TAKE 30-60 MIN BEFORE FIRST MEAL OF THE DAY Patient not taking: Reported on 03/14/2017 01/01/17   Tanda Rockers, MD    Family History Family History  Problem Relation Age of Onset  . Leukemia Other   . Diabetes Other   . Cervical cancer Mother   . Diabetes Mother   . Skin cancer Mother   . Hyperlipidemia Mother   . Hypertension Father   . Asthma Father   . Skin cancer Maternal Aunt   . Multiple myeloma Maternal Aunt   . Testicular cancer Maternal Uncle   . Hyperlipidemia Maternal Grandmother   . Prostate cancer Maternal Grandfather     Social History Social History  Substance Use Topics  . Smoking status: Never Smoker  . Smokeless tobacco: Never Used  . Alcohol use No     Comment: Rare     Allergies   Sulfa antibiotics; Adhesive [tape]; Other; Relpax [eletriptan hydrobromide]; Ultram [tramadol hcl]; and Ultram [tramadol]   Review of Systems Review of Systems  Constitutional: Negative for chills and fever.  HENT: Negative for facial swelling and trouble swallowing.   Eyes: Negative for visual disturbance.  Respiratory: Negative for cough and shortness of breath.   Cardiovascular: Negative for chest pain, palpitations and leg swelling.  Gastrointestinal: Negative for abdominal pain, constipation, diarrhea, nausea and vomiting.  Genitourinary: Negative for dysuria, flank pain, frequency and hematuria.  Musculoskeletal: Negative for back pain, myalgias, neck pain and neck stiffness.  Skin: Negative for rash and wound.  Neurological: Positive for seizures. Negative for dizziness, weakness, light-headedness, numbness and headaches.  All other systems reviewed and are negative.    Physical Exam Updated Vital Signs BP (!) 135/98   Pulse 100   Temp 98.2 F (36.8 C)   Resp 16   Ht '5\' 5"'  (1.651 m)   Wt  79.4 kg (175 lb)   LMP 08/31/2016 (Exact Date)   SpO2 100%   BMI 29.12 kg/m   Physical Exam  Constitutional: She is oriented to person, place, and time. She appears well-developed and well-nourished. No distress.  HENT:  Head: Normocephalic and atraumatic.  Mouth/Throat: Oropharynx is clear and moist.  Small abrasion to the right lateral surface of the tongue.  Eyes: Pupils are equal, round, and reactive to light. EOM are normal.  Neck: Normal range of motion. Neck supple.  No posterior midline cervical tenderness to palpation. No meningismus.  Cardiovascular: Regular rhythm.  Exam reveals no gallop and no friction rub.   No murmur heard. Mild tachycardia  Pulmonary/Chest: Effort normal and breath sounds normal. No respiratory distress. She has no wheezes. She has no rales. She exhibits no tenderness.  Abdominal:  Soft. Bowel sounds are normal. There is no tenderness. There is no rebound and no guarding.  Gravid abdomen. Nontender.  Musculoskeletal: Normal range of motion. She exhibits no edema or tenderness.  No midline thoracic or lumbar tenderness. No CVA tenderness. No lower extremity swelling, asymmetry or tenderness. Distal pulses are 2+.  Neurological: She is alert and oriented to person, place, and time.  5/5 motor in all joints. Sensation fully intact. No ataxia.  Skin: Skin is warm and dry. Capillary refill takes less than 2 seconds. No rash noted. She is not diaphoretic. No erythema.  Psychiatric: She has a normal mood and affect. Her behavior is normal.  Nursing note and vitals reviewed.    ED Treatments / Results  Labs (all labs ordered are listed, but only abnormal results are displayed) Labs Reviewed  CBC WITH DIFFERENTIAL/PLATELET - Abnormal; Notable for the following:       Result Value   MCH 25.7 (*)    All other components within normal limits  COMPREHENSIVE METABOLIC PANEL - Abnormal; Notable for the following:    Sodium 134 (*)    Potassium 3.0 (*)    CO2  20 (*)    Glucose, Bld 133 (*)    BUN <5 (*)    Calcium 8.8 (*)    Total Protein 6.1 (*)    Albumin 2.9 (*)    All other components within normal limits  URIC ACID  URINALYSIS, ROUTINE W REFLEX MICROSCOPIC  PROTEIN, URINE, 24 HOUR  RAPID URINE DRUG SCREEN, HOSP PERFORMED  PROTEIN / CREATININE RATIO, URINE    EKG  EKG Interpretation  Date/Time:  Saturday March 14 2017 23:46:20 EDT Ventricular Rate:  96 PR Interval:    QRS Duration: 91 QT Interval:  395 QTC Calculation: 500 R Axis:   59 Text Interpretation:  Sinus rhythm Borderline repol abnormality, lateral leads Prolonged QT interval Baseline wander in lead(s) V2 V5 V6 Confirmed by Lita Mains  MD, Daci Stubbe ([id number]) on 03/14/2017 11:54:16 PM       Radiology No results found.  Procedures Procedures (including critical care time)  Medications Ordered in ED Medications  labetalol (NORMODYNE,TRANDATE) injection 20-80 mg (0 mg Intravenous Hold 03/14/17 2230)  hydrALAZINE (APRESOLINE) injection 10 mg (0 mg Intravenous Hold 03/14/17 2230)  potassium chloride 10 mEq in 100 mL IVPB (not administered)     Initial Impression / Assessment and Plan / ED Course  I have reviewed the triage vital signs and the nursing notes.  Pertinent labs & imaging results that were available during my care of the patient were reviewed by me and considered in my medical decision making (see chart for details).     Patient with borderline elevated blood pressures. Discussed with patient's OB/GYN. Start IV potassium replacement. Patient remains at baseline neuro status. Next  Discussed with Dr. Murrell Redden. Will accept in transfer to women's for blood pressure observation.  Final Clinical Impressions(s) / ED Diagnoses   Final diagnoses:  Breakthrough seizure ([place])  Hypokalemia    New Prescriptions New Prescriptions   No medications on file     Julianne Rice, MD 03/14/17 2354

## 2017-03-14 NOTE — ED Notes (Signed)
Fetal Heart Rate of 146 per TOCO monitor

## 2017-03-14 NOTE — Progress Notes (Signed)
Tct: Dr. Murrell Redden. Updated on patient status, EFM, and no uterine activity.  Will continue EFM until cleared by ED providerand possible transfer to [facility] for 24hr OBS.

## 2017-03-14 NOTE — Progress Notes (Signed)
Called to [facility] Ed to see patient G3P0, [redacted]w[redacted]d gestation, patient had tonic-clonic seizure at home and to ED via EMS. Patient with hx of seizures, no tonic clonic seizure in 10 years. No reported LOF or vaginal bleeding. Patient denies contractions. Placed on EFM, monitoring in progress.

## 2017-03-14 NOTE — ED Triage Notes (Addendum)
Pt BIB [facility] for seizures. WItnessed by husband 4 mins in duration. Pt was postictal when fire dept arrived. Pt A+Ox4 in triage NAD Noted. Pt is 27 weeks preg. G3P0. Pt stated she did have some spotting this AM and her OB was notified. 4mg  ODT of zofran administered by EMS

## 2017-03-14 NOTE — Progress Notes (Signed)
Patient reports scant amount pink discarge this am. No further blding, discharge noted.

## 2017-03-15 ENCOUNTER — Encounter (HOSPITAL_COMMUNITY): Payer: Self-pay | Admitting: *Deleted

## 2017-03-15 ENCOUNTER — Inpatient Hospital Stay (HOSPITAL_COMMUNITY): Payer: 59

## 2017-03-15 DIAGNOSIS — O99352 Diseases of the nervous system complicating pregnancy, second trimester: Secondary | ICD-10-CM

## 2017-03-15 DIAGNOSIS — O99353 Diseases of the nervous system complicating pregnancy, third trimester: Secondary | ICD-10-CM | POA: Diagnosis not present

## 2017-03-15 DIAGNOSIS — G40909 Epilepsy, unspecified, not intractable, without status epilepticus: Secondary | ICD-10-CM

## 2017-03-15 LAB — CBC
HCT: 35.1 % — ABNORMAL LOW (ref 36.0–46.0)
Hemoglobin: 11.5 g/dL — ABNORMAL LOW (ref 12.0–15.0)
MCH: 26.4 pg (ref 26.0–34.0)
MCHC: 32.8 g/dL (ref 30.0–36.0)
MCV: 80.5 fL (ref 78.0–100.0)
PLATELETS: 178 10*3/uL (ref 150–400)
RBC: 4.36 MIL/uL (ref 3.87–5.11)
RDW: 14.5 % (ref 11.5–15.5)
WBC: 12.8 10*3/uL — AB (ref 4.0–10.5)

## 2017-03-15 LAB — RAPID URINE DRUG SCREEN, HOSP PERFORMED
Amphetamines: NOT DETECTED
BARBITURATES: NOT DETECTED
Benzodiazepines: NOT DETECTED
COCAINE: NOT DETECTED
Opiates: NOT DETECTED
TETRAHYDROCANNABINOL: NOT DETECTED

## 2017-03-15 LAB — COMPREHENSIVE METABOLIC PANEL
ALBUMIN: 3.1 g/dL — AB (ref 3.5–5.0)
ALT: 15 U/L (ref 14–54)
AST: 21 U/L (ref 15–41)
Alkaline Phosphatase: 59 U/L (ref 38–126)
Anion gap: 10 (ref 5–15)
CHLORIDE: 107 mmol/L (ref 101–111)
CO2: 21 mmol/L — AB (ref 22–32)
Calcium: 8 mg/dL — ABNORMAL LOW (ref 8.9–10.3)
Creatinine, Ser: 0.37 mg/dL — ABNORMAL LOW (ref 0.44–1.00)
GFR calc Af Amer: >60 mL/min (ref >60)
GFR calc non Af Amer: >60 mL/min (ref >60)
GLUCOSE: 91 mg/dL (ref 65–99)
POTASSIUM: 3.1 mmol/L — AB (ref 3.5–5.1)
SODIUM: 138 mmol/L (ref 135–145)
Total Bilirubin: 0.6 mg/dL (ref 0.3–1.2)
Total Protein: 6.2 g/dL — ABNORMAL LOW (ref 6.5–8.1)

## 2017-03-15 LAB — PROTEIN / CREATININE RATIO, URINE
CREATININE, URINE: 33.13 mg/dL
PROTEIN CREATININE RATIO: 0.88 mg/mg{creat} — AB (ref 0.00–0.15)
Total Protein, Urine: 29 mg/dL

## 2017-03-15 LAB — TYPE AND SCREEN
ABO/RH(D): A POS
ANTIBODY SCREEN: NEGATIVE

## 2017-03-15 MED ORDER — BETAMETHASONE SOD PHOS & ACET 6 (3-3) MG/ML IJ SUSP
12.0000 mg | INTRAMUSCULAR | Status: AC
  Administered 2017-03-15 – 2017-03-16 (×2): 12 mg via INTRAMUSCULAR
  Filled 2017-03-15 (×2): qty 2

## 2017-03-15 MED ORDER — LAMOTRIGINE 100 MG PO TABS
450.0000 mg | ORAL_TABLET | ORAL | Status: AC
  Administered 2017-03-15: 450 mg via ORAL
  Filled 2017-03-15: qty 4.5

## 2017-03-15 MED ORDER — FLUTICASONE PROPIONATE 50 MCG/ACT NA SUSP
2.0000 | Freq: Every day | NASAL | Status: DC
  Administered 2017-03-15 – 2017-03-16 (×2): 2 via NASAL
  Filled 2017-03-15: qty 16

## 2017-03-15 MED ORDER — FLUTICASONE PROPIONATE 50 MCG/ACT NA SUSP: 2.0000 | Freq: Every day | NASAL | Status: DC

## 2017-03-15 MED ORDER — ZOLPIDEM TARTRATE 5 MG PO TABS
5.0000 mg | ORAL_TABLET | Freq: Every evening | ORAL | Status: DC | PRN
  Administered 2017-03-15: 5 mg via ORAL

## 2017-03-15 MED ORDER — PRENATAL MULTIVITAMIN CH
1.0000 | ORAL_TABLET | Freq: Every day | ORAL | Status: DC
  Administered 2017-03-16: 1 via ORAL
  Filled 2017-03-15: qty 1

## 2017-03-15 MED ORDER — MAGNESIUM SULFATE 40 G IN LACTATED RINGERS - SIMPLE
2.0000 g/h | INTRAVENOUS | Status: AC
  Administered 2017-03-15: 2 g/h via INTRAVENOUS
  Filled 2017-03-15: qty 500

## 2017-03-15 MED ORDER — FLUTICASONE PROPIONATE 50 MCG/ACT NA SUSP: 1.0000 | Freq: Every day | NASAL | Status: DC

## 2017-03-15 MED ORDER — POTASSIUM CHLORIDE CRYS ER 20 MEQ PO TBCR
20.0000 meq | EXTENDED_RELEASE_TABLET | Freq: Once | ORAL | Status: AC
  Administered 2017-03-15: 20 meq via ORAL
  Filled 2017-03-15: qty 1

## 2017-03-15 MED ORDER — LEVETIRACETAM 750 MG PO TABS
1500.0000 mg | ORAL_TABLET | Freq: Two times a day (BID) | ORAL | Status: DC
  Administered 2017-03-15: 1500 mg via ORAL
  Filled 2017-03-15 (×3): qty 2

## 2017-03-15 MED ORDER — MAGNESIUM SULFATE 40 G IN LACTATED RINGERS - SIMPLE
2.0000 g/h | INTRAVENOUS | Status: DC
  Filled 2017-03-15: qty 500

## 2017-03-15 MED ORDER — POTASSIUM CHLORIDE 20 MEQ PO PACK
20.0000 meq | PACK | Freq: Once | ORAL | Status: DC
  Filled 2017-03-15: qty 1

## 2017-03-15 MED ORDER — LAMOTRIGINE 150 MG PO TABS
450.0000 mg | ORAL_TABLET | Freq: Two times a day (BID) | ORAL | Status: AC
  Administered 2017-03-15 (×2): 450 mg via ORAL
  Filled 2017-03-15 (×4): qty 3

## 2017-03-15 MED ORDER — LEVETIRACETAM 500 MG PO TABS
1500.0000 mg | ORAL_TABLET | ORAL | Status: AC
  Administered 2017-03-15: 1500 mg via ORAL
  Filled 2017-03-15: qty 3

## 2017-03-15 MED ORDER — CALCIUM CARBONATE ANTACID 500 MG PO CHEW: 2.0000 | CHEWABLE_TABLET | ORAL | Status: DC | PRN

## 2017-03-15 MED ORDER — LEVETIRACETAM 750 MG PO TABS
1750.0000 mg | ORAL_TABLET | Freq: Two times a day (BID) | ORAL | Status: DC
  Administered 2017-03-15 – 2017-03-16 (×2): 1750 mg via ORAL
  Filled 2017-03-15 (×5): qty 1

## 2017-03-15 MED ORDER — LACTATED RINGERS IV SOLN
INTRAVENOUS | Status: DC
  Administered 2017-03-15 – 2017-03-16 (×3): via INTRAVENOUS

## 2017-03-15 MED ORDER — ACETAMINOPHEN 325 MG PO TABS
650.0000 mg | ORAL_TABLET | ORAL | Status: DC | PRN
  Administered 2017-03-15 (×2): 650 mg via ORAL
  Filled 2017-03-15 (×2): qty 2

## 2017-03-15 MED ORDER — MAGNESIUM SULFATE 40 G IN LACTATED RINGERS - SIMPLE: 2.0000 g/h | INTRAVENOUS | Status: DC

## 2017-03-15 MED ORDER — DOCUSATE SODIUM 100 MG PO CAPS
100.0000 mg | ORAL_CAPSULE | Freq: Every day | ORAL | Status: DC
  Administered 2017-03-15 – 2017-03-16 (×2): 100 mg via ORAL
  Filled 2017-03-15 (×2): qty 1

## 2017-03-15 MED ORDER — GUAIFENESIN 100 MG/5ML PO SOLN
10.0000 mL | Freq: Four times a day (QID) | ORAL | Status: DC | PRN
  Administered 2017-03-15 (×2): 200 mg via ORAL
  Administered 2017-03-15: 20:00:00 via ORAL
  Administered 2017-03-16: 200 mg via ORAL
  Filled 2017-03-15 (×4): qty 15

## 2017-03-15 MED ORDER — MAGNESIUM SULFATE BOLUS VIA INFUSION
4.0000 g | Freq: Once | INTRAVENOUS | Status: DC
  Filled 2017-03-15: qty 500

## 2017-03-15 MED ORDER — LEVETIRACETAM ER 500 MG PO TB24
1500.0000 mg | ORAL_TABLET | ORAL | Status: DC
  Filled 2017-03-15: qty 3

## 2017-03-15 MED ORDER — LAMOTRIGINE 150 MG PO TABS
450.0000 mg | ORAL_TABLET | ORAL | Status: DC
  Filled 2017-03-15: qty 3

## 2017-03-15 MED ORDER — LAMOTRIGINE 150 MG PO TABS
300.0000 mg | ORAL_TABLET | Freq: Three times a day (TID) | ORAL | Status: DC
  Administered 2017-03-16 (×2): 300 mg via ORAL
  Filled 2017-03-15 (×5): qty 2

## 2017-03-15 MED ORDER — MAGNESIUM SULFATE BOLUS VIA INFUSION
4.0000 g | Freq: Once | INTRAVENOUS | Status: AC
  Administered 2017-03-15: 4 g via INTRAVENOUS
  Filled 2017-03-15: qty 500

## 2017-03-15 MED ORDER — LEVOTHYROXINE SODIUM 175 MCG PO TABS
175.0000 ug | ORAL_TABLET | Freq: Every day | ORAL | Status: DC
  Administered 2017-03-15 – 2017-03-16 (×2): 175 ug via ORAL
  Filled 2017-03-15 (×3): qty 1

## 2017-03-15 MED ORDER — MAGNESIUM SULFATE 2 GM/50ML IV SOLN
2.0000 g | Freq: Once | INTRAVENOUS | Status: DC
  Filled 2017-03-15: qty 50

## 2017-03-15 MED ORDER — LORATADINE 10 MG PO TABS
10.0000 mg | ORAL_TABLET | Freq: Every day | ORAL | Status: DC
  Administered 2017-03-15 – 2017-03-16 (×2): 10 mg via ORAL
  Filled 2017-03-15 (×3): qty 1

## 2017-03-15 NOTE — Progress Notes (Signed)
Comfortable, did sleep for a couple of hours; denies any ctx, vb, lof; +FM No sob, cp, n/v; no other seizures Does note mild h/a now, would like tylenol  Temp:  [98.2 F (36.8 C)-98.7 F (37.1 C)] 98.7 F (37.1 C) (08/12 0804) Pulse Rate:  [91-117] 93 (08/12 1300) Resp:  [14-22] 16 (08/12 1200) BP: (107-140)/(56-98) 107/60 (08/12 1300) SpO2:  [94 %-100 %] 100 % (08/12 1230) Weight:  [175 lb (79.4 kg)] 175 lb (79.4 kg) (08/11 2202)   Intake/Output Summary (Last 24 hours) at 03/15/17 1421 Last data filed at 03/15/17 1400  Gross per 24 hour  Intake             1610 ml  Output             2025 ml  Net             -415 ml     A&ox3 rrr ctab Abd: soft, nt, gravid Cx: deferred LE: no edema, nt bilat le; 2+ reflexes  CBC Latest Ref Rng & Units 03/15/2017 03/14/2017 11/17/2016  WBC 4.0 - 10.5 K/uL 12.8(H) 10.5 12.0(H)  Hemoglobin 12.0 - 15.0 g/dL 11.5(L) 12.3 13.3  Hematocrit 36.0 - 46.0 % 35.1(L) 37.9 39.6  Platelets 150 - 400 K/uL 178 177 295   CMP Latest Ref Rng & Units 03/15/2017 03/14/2017 02/21/2013  Glucose 65 - 99 mg/dL 91 133(H) 86  BUN 6 - 20 mg/dL <5(L) <5(L) 8  Creatinine 0.44 - 1.00 mg/dL 0.37(L) 0.56 0.62  Sodium 135 - 145 mmol/L 138 134(L) 136  Potassium 3.5 - 5.1 mmol/L 3.1(L) 3.0(L) 4.1  Chloride 101 - 111 mmol/L 107 106 101  CO2 22 - 32 mmol/L 21(L) 20(L) 24  Calcium 8.9 - 10.3 mg/dL 8.0(L) 8.8(L) 9.7  Total Protein 6.5 - 8.1 g/dL 6.2(L) 6.1(L) -  Total Bilirubin 0.3 - 1.2 mg/dL 0.6 0.3 -  Alkaline Phos 38 - 126 U/L 59 58 -  AST 15 - 41 U/L 21 24 -  ALT 14 - 54 U/L 15 16 -   FHT: 130s, nml variability, +accels, no decels; cat 1 Toco: no ctx  Bpp: 8/8  A/p: iup at 28.0 1. Seizure - neurology consult and appears breakthrough seizure and less likely eclamptic seizure; recommendation for MRI to further eval; continue mag sulfate until 24 hr urine completed (0730) and MRI results avail - plan made with neuro, also no med change until results from urine/imaging  avail; contin with seizure measures in room; bps continue wnl lfts stable and nml; no signs/sx magnesium toxicity; repeat labs in am; follow status closely; Dr. Leonel Ramsay on for neuro today - call with Dr. [name] Heck pending;  2. Fetal status reassuring - contin efm 3. Hypothyroid - contin synthroid 4. Prematurity - although appears less likely eclampsia will give bmz x2; no indications at this point for need for delivery now 5. Hypokalemia - s/p replacement, slight improvement; will need further replacement 6. Abdominal/cervical cerclage - stable 7. H/o migraine 8. Cough - suspect allergic etiology ?? Dust expoure; try claritin q day

## 2017-03-15 NOTE — Progress Notes (Signed)
Bedside Ultra Sound performed.

## 2017-03-15 NOTE — Progress Notes (Signed)
Upon further review, I expect her clearance of keppra is increased, and therefore we likely have room to increase this. I will increase it to 1750mg  BID. LEV level pending.   Roland Rack, MD [facility] [phone]  If 7pm- 7am, please page neurology on call as listed in [place].

## 2017-03-15 NOTE — Progress Notes (Signed)
Same day chart review follow up note:  -Due to increased clearance of all antiepileptics in pregnancy, agree with increasing Keppra to 1750 BID as recommended by Dr. Leonel Ramsay. -Also, for the Lamictal - would like to do Lamictal XR tablets 450 BID. I checked with pharmacy and they do not have the XR tabs on formulary and it might be a few days to try to get that. So alternatively, we would recommend doing Lamictal 300 mg TID and upon discharge she can be given a prescription for Lamictal-XR 450 BID (to keep a total daily dose 900 mg). -Addition of the magnesium drip will possibly provide extra protection against epileptic.  To summarize:  AED recommendations while hospitalized: 1. Keppra 1750mg  PO  BID (total 3500 mg BID) 2. Lamictal 300 mg PO TID  AED upon discharge: 1. Keppra 1750mg  PO  BID 2. Lamictal-XR 450 mg PO BID  Plan communicated to Dr. Genia Harold via phone.  -- Amie Portland, MD [facility] [phone]  If 7pm to 7am, please call on call as listed on AMION.

## 2017-03-15 NOTE — Progress Notes (Signed)
From neuro perspective, no contraindications to magnesium drip at this time. Will get MRI to assess for PRES type changes. Will continue current AED regimen at this time.   Roland Rack, MD [facility] [phone]  If 7pm- 7am, please page neurology on call as listed in [place].

## 2017-03-15 NOTE — Progress Notes (Signed)
Patient cleared by ED provider and [facility] in room to transport patient to [facility] at [facility] for 24hr OBS>  Report called to [facility]

## 2017-03-15 NOTE — Progress Notes (Signed)
   03/15/17 0100  Vitals  Temp 98.6 F (37 C)  Temp Source Oral  BP 134/73  BP Location Left Arm  BP Method Automatic  Patient Position (if appropriate) Sitting  ECG Heart Rate 99  Resp 18  Oxygen Therapy  SpO2 99 %  O2 Device Room Air  Received patient from [place]. Pt awake alert and oriented x 4. IV NS and Potassium 10 mEq infusing with an additional 2 runs to be infused for a total of 3 runs. Seizure precaution in place. Patient oriented to room call bell at close reach and family members at bedside.

## 2017-03-15 NOTE — H&P (Addendum)
Kelly Mcclure is a 35 y.o. female G3P0020 at 28.1 by 7 wk u/s presenting for seizure.  The patient has a h/o seizure d/o and has been stable for many years on keppra and lamictal.  She reports h/o one "big one" about 10 years ago in college, none since until today.  She also has a h/o absence seizures.  She had a seizure at 2100 today that was witnessed by her fiance - he thinks lasted no more that 2 minutes.  EMS was called and she was initially taken to [facility] ED for evaluation and subsequently cleared, felt breakthrough seizure and not eclampsia - she was then transferred to [facility].  Upon arrival to [facility], pt had another seizure witnessed by nursing - pt initially screamed then tonic/clonic seizure for 1 min.   House physician was called to room and ordered pm doses given stat.  The patient returned to consciousness, however, doesn't remember the event and mild memory deficits noted while getting history.  She denies severe headache, visual changes, ruq pain.  She does feel like a h/a might be coming on now. The patient denies any vaginal bleeding, contractions, or leaking fluid.  She did note seeing pink on paper after wiping early in the morning 8/11- very minimal - and has not occurred since.    Antepartum course complicated by abdominal and cervical cerclage d/t her h/o cervical cancer and CKC.  This has been stable.  She also has a h/o hypothyroid and has been stable with synthroid.  She also has a h/o migraine h/a.  Pt does also c/o persistent cough for several months - began before her pregnancy.  Describes as a dry cough mostly but sometimes is productive and is a thick, clear sputum.  She has tried cough suppressant w/o much relief, drinking water can help.  Has been waking up by coughing about q hs lately.  She says she hasn't had much sleep and that this is a trigger for her seizures - wondering if that was cause.  No prior h/o cough like this.  No sick contacts and no one else has  cough like this.  She denies seasonal allergies but recently was informed of allergy to dust - she is wondering if dust related allergy is causing cough.    [facility] at [employer], dated by 7 wk u/s   History OB History    Gravida Para Term Preterm AB Living   3       2 0   SAB TAB Ectopic Multiple Live Births   2             Past Medical History:  Diagnosis Date  . Cancer (HCC)    hx of cervical cancer  . Complication of anesthesia    after some surgeries - pt woke up with migraine  . GERD (gastroesophageal reflux disease)    with pregnancy  . H/O radioactive iodine thyroid ablation 12/2009  . 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 History of kidney stones 2011  . Hypothyroidism   . Migraines   . Seizure disorder ([place])    Diagnosed at age 70  . Seizures ([place])    clonic tonic seizures - will "space out " for a very brief moment.  take medication for seizures. HX OF MAJOR SEIZURES - LOOSING CONSCIOUSNESS-- APPROX 10 TO 12 YRS AGO- NEUROLOGIST IS DR. Raliegh Ip. MILLER IN [place], absent seizures  . 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 ABDOMINAL CERCLAGE N/A 11/19/2016   Procedure: LAPAROSCOPIC TRANSABDOMINAL CERVICAL-ISTHMUSx2;  Surgeon: Governor Specking, MD;  Location: [facility];  Service: Gynecology;  Laterality: N/A;  with ultrasound guidance. Start laparoscopically HOLD OPEN ABDOMINAL ITEMS   . ADENOIDECTOMY  1995  . CERCLAGE LAPAROSCOPIC ABDOMINAL N/A 11/19/2016   Procedure: CERCLAGE LAPAROSCOPIC ABDOMINAL;  Surgeon: Governor Specking, MD;  Location: [facility];  Service: Gynecology;  Laterality: N/A;  . cold knife conization    . DILATION AND CURETTAGE OF UTERUS  2009  . DILATION AND CURETTAGE OF UTERUS N/A 03/01/2013   Procedure: CERVICAL DILATATION AND ENDOCERVICAL CURRETAGE AND VAAGINAL BIOPIES;  Surgeon: Alvino Chapel, MD;  Location: [facility];  Service: Gynecology;  Laterality: N/A;   . LEEP  02/25/2010  . LEEP  07/25/11  . MOUTH SURGERY    . NASAL SEPTUM SURGERY  2006   Repair of deviated septum  . SIGMOIDOSCOPY    . TONSILLECTOMY  1995   Family History: family history includes Asthma in her father; Cervical cancer in her mother; Diabetes in her mother and other; Hyperlipidemia in her maternal grandmother and mother; Hypertension in her father; Leukemia in her other; Multiple myeloma in her maternal aunt; Prostate cancer in her maternal grandfather; Skin cancer in her maternal aunt and mother; Testicular cancer in her maternal uncle. Social History:  reports that she has never smoked. She has never used smokeless tobacco. She reports that she does not drink alcohol or use drugs.  ROS    Blood pressure 134/73, pulse (!) 103, temperature 98.6 F (37 C), temperature source Oral, resp. rate 18, height '5\' 5"'  (1.651 m), weight 175 lb (79.4 kg), last menstrual period 08/31/2016, SpO2 99 %. Exam   Physical Exam  Prenatal labs: ABO, Rh: --/--/A POS, A POS (04/16 1450) Antibody: NEG (04/16 1450) Rubella: !Error! NI  RPR:   negative HBsAg:   negative HIV:   negative GBS:    1 hr Glucola Genetic screening: nipt neg, afp screen negative   Physical Exam:  A&O x 3, unable to remember events at time of seizure Lungs: ctab CV: rrr Abd: soft, nt, gravid Extr: no edema, nt bilat Pelvic: deferred     CBC    Component Value Date/Time   WBC 10.5 03/14/2017 2226   RBC 4.78 03/14/2017 2226   HGB 12.3 03/14/2017 2226   HCT 37.9 03/14/2017 2226   PLT 177 03/14/2017 2226   MCV 79.3 03/14/2017 2226   MCH 25.7 (L) 03/14/2017 2226   MCHC 32.5 03/14/2017 2226   RDW 14.0 03/14/2017 2226   LYMPHSABS 2.7 03/14/2017 2226   MONOABS 0.7 03/14/2017 2226   EOSABS 0.1 03/14/2017 2226   BASOSABS 0.0 03/14/2017 2226   CMP Latest Ref Rng & Units 03/14/2017 02/21/2013  Glucose 65 - 99 mg/dL 133(H) 86  BUN 6 - 20 mg/dL <5(L) 8  Creatinine 0.44 - 1.00 mg/dL 0.56 0.62  Sodium 135 -  145 mmol/L 134(L) 136  Potassium 3.5 - 5.1 mmol/L 3.0(L) 4.1  Chloride 101 - 111 mmol/L 106 101  CO2 22 - 32 mmol/L 20(L) 24  Calcium 8.9 - 10.3 mg/dL 8.8(L) 9.7  Total Protein 6.5 - 8.1 g/dL 6.1(L) -  Total Bilirubin 0.3 - 1.2 mg/dL 0.3 -  Alkaline Phos 38 - 126 U/L 58 -  AST 15 - 41 U/L 24 -  ALT 14 - 54 U/L 16 -   Pr/cr ratio: .88  FHT: 120s, 130s, nml variability, no decels, +  accels; difficult to trace d/t maternal position or FM Toco: no ctx  Assessment/Plan:  IUP at 28 1 1. Seizure x2: pt with known seizure d/o, bps within normal, lfts wnl; s/p neurology consult (Dr. Shona Needles) and suspicion of breakthrough seizure vs eclampsia(less likely) - recommendation to contin pt at her current dose of keppra and lamictal, also recommends keppra and lamictal level at 0800 before next dose; recommendation for ativan 1 mg IV if pt has seizure lasting >32mn.    Dr. AShona Needleswill contact Dr MSabra Heck(pt's neurologist) to review pt's history and provide further recommendations.  While this does seem more c/w breakthrough seizure, pro/creatine is elevated but there is no baseline to compare.  Discussed case at 0430 with Dr. WLisbeth Renshaw  Normal bps and nml lfts reassuring, but concern of elevated pr/cr ration.  Plan to collect 24 hr protein to further evaluate and will plan to begin magnesium sulfate now for seizure prophylaxis while 24 hr urine is being collected and neurology completes evaluation, can d/c if eval seems c/w breakthrough seizure and not eclampsia.  Stated more than once that delivery is not indicated at this time, and agree. Reviewed plan with Dr. AShona Needlesand he is in agreement to begin magnesium sulfate now.   2. Fetal status - reassuring, bpp pending; continuous fm 3. Hypothyroid - contin 175 mcg q day 4. Cough - ? Allergic eiology 5. Migraine 6. Abdominal and cervical cerclage - stable 7. H/o cervical cancer, s/p ckc 8. Hypokalemia - s[facility]p 331m [facility]07/2017, 3:02 AM

## 2017-03-15 NOTE — Progress Notes (Addendum)
   03/15/17 0614  Vitals  BP 114/75  BP Location Left Arm  BP Method Automatic  Patient Position (if appropriate) Lying  Pulse Rate 96  Pulse Rate Source Monitor  Resp 16  Oxygen Therapy  SpO2 99 %  O2 Device Room Air  Magnesium Sulfate 4 g bolus initiated. Patient education completed, and pt verbalized understanding of same. Pt remain alert and oriented x 4. Reflex 1+ no clonus, lungs CTA and frequent vs initiated. RN remain at bedside. We will continue to monitor.

## 2017-03-15 NOTE — Progress Notes (Signed)
Patient had an episode of Grand mal seizure activity which lasted approximately 60 seconds. Patient was in bed and was supported and protected by her nurse and her family. Patient did not suffer any injuries. Dr Mervin Hack was on the telephone talking to writer at the time of the incident and she arrived shortly after.  Dr Kennon Rounds remained at patients' bedside until Dr Mervin Hack came and took over the care of the patient. Neurology was consulted and MD came and assess patient.

## 2017-03-15 NOTE — Consult Note (Signed)
Neurology Consultation  Reason for Consult: Seizures Referring Physician: Dr. S. Almquist, OBGYN, [facility].  CC: seizure  History is obtained from: patient, husband  HPI: Kelly Mcclure is a 36 y.o. female  With PMH of seizure disorder, migraines, hypothyroidism, history of cervical cancer, who is currently [redacted] weeks pregnant, and on Keppra 1500 mg twice a day as well as Lamictal 450 mg twice a day, who had 1 seizure at home followed by another seizure at the [facility], where she came in after her first seizure. The patient has been complaining of insomnia, caused by a chronic long-standing cough etiology of which is not been able to be ascertained yet. She was in her usual state of health until this evening at around 9 PM, when she had a what the husband says is a generalized tonic-clonic seizure. Her last generalized tonic-clonic seizure was many many years ago and in the interim she has had some what have been described as absence seizures. She reports compliance to her medications. Reports no fluctuating vitals included blood pressures during this pregnancy. No reports of recent head trauma. No reports of recent headache worsening. She is currently being observed at [facility]. Denies illicit drug use.   ROS: A 14 point ROS was performed and is negative except as noted in the HPI.  Positive for nagging cough that has been off and on going on for a long while now without any explanation.  Past Medical History:  Diagnosis Date  . Cancer (HCC)    hx of cervical cancer  . Complication of anesthesia    after some surgeries - pt woke up with migraine  . GERD (gastroesophageal reflux disease)    with pregnancy  . H/O radioactive iodine thyroid ablation 12/2009  . 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 History of kidney stones 2011  . Hypothyroidism   . Migraines   . Seizure disorder (HCC)    Diagnosed at age 20  . Seizures (HCC)    clonic  tonic seizures - will "space out " for a very brief moment.  take medication for seizures. HX OF MAJOR SEIZURES - LOOSING CONSCIOUSNESS-- APPROX 10 TO 12 YRS AGO- NEUROLOGIST IS DR. K. MILLER IN [place], absent seizures  . 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 Leukemia Other   . Diabetes Other   . Cervical cancer Mother   . Diabetes Mother   . Skin cancer Mother   . Hyperlipidemia Mother   . Hypertension Father   . Asthma Father   . Skin cancer Maternal Aunt   . Multiple myeloma Maternal Aunt   . Testicular cancer Maternal Uncle   . Hyperlipidemia Maternal Grandmother   . Prostate cancer Maternal Grandfather     Social History:   reports that she has never smoked. She has never used smokeless tobacco. She reports that she does not drink alcohol or use drugs.  Medications  Current Facility-Administered Medications:  .  acetaminophen (TYLENOL) tablet 650 mg, 650 mg, Oral, Q4H PRN, Almquist, Susan E, MD .  calcium carbonate (TUMS - dosed in mg elemental calcium) chewable tablet 400 mg of elemental calcium, 2 tablet, Oral, Q4H PRN, Almquist, Susan E, MD .  docusate sodium (COLACE) capsule 100 mg, 100 mg, Oral, Daily, Almquist, Susan E, MD .  hydrALAZINE (APRESOLINE) injection 10 mg, 10 mg, Intravenous, Once PRN, Upstill, Shari, PA-C, Stopped at   03/14/17 2230 .  labetalol (NORMODYNE,TRANDATE) injection 20-80 mg, 20-80 mg, Intravenous, Q10 min PRN, Upstill, Shari, PA-C, Stopped at 03/14/17 2230 .  potassium chloride 10 mEq in 100 mL IVPB, 10 mEq, Intravenous, Q1 Hr x 3, Julianne Rice, MD, Last Rate: 100 mL/hr at 03/15/17 0349, 10 mEq at 03/15/17 0349 .  prenatal multivitamin tablet 1 tablet, 1 tablet, Oral, Q1200, Murrell Redden, Earlyne Iba, MD .  zolpidem (AMBIEN) tablet 5 mg, 5 mg, Oral, QHS PRN, Charyl Bigger, MD, 5 mg at 03/15/17 0348   Exam: Current vital signs: BP 111/73 (BP Location: Left Arm)   Pulse (!)  111   Temp 98.6 F (37 C) (Oral)   Resp 16   Ht 5' 5" (1.651 m)   Wt 79.4 kg (175 lb)   LMP 08/31/2016 (Exact Date)   SpO2 98%   BMI 29.12 kg/m  Vital signs in last 24 hours: Temp:  [98.2 F (36.8 C)-98.6 F (37 C)] 98.6 F (37 C) (08/12 0100) Pulse Rate:  [91-117] 111 (08/12 0305) Resp:  [16-22] 16 (08/12 0305) BP: (111-140)/(73-98) 111/73 (08/12 0305) SpO2:  [97 %-100 %] 98 % (08/12 0305) Weight:  [79.4 kg (175 lb)] 79.4 kg (175 lb) (08/11 2202)  GENERAL: , alert in NAD HEENT: - Normocephalic and atraumatic, dry mm, no LN++, no Thyromegally LUNGS - Clear to auscultation bilaterally with no wheezes CV - S1S2 RRR, no m/r/g, equal pulses bilaterally. ABDOMEN - pregnant, distended. Everted umbilicus. Nontender. Had a monitor strapped on at the time of this encounter. Ext: warm, well perfused, intact peripheral pulses, no pedal edema  NEURO:  Mental Status: AA&Ox3  Language: speech is clear  Naming, repetition, fluency, and comprehension intact. Cranial Nerves: PERRL. EOMI, visual fields full, no facial asymmetry,facial sensation intact, hearing intact, tongue/uvula/soft palate midline, normal sternocleidomastoid and trapezius muscle strength. No evidence of tongue atrophy or fibrillations Motor: 5/5 strength all over, normal tone and normal bulk, normal range of motion. Sensation- Intact to light touch bilaterally Coordination: FTN intact bilaterally Gait- deferred   Labs I have reviewed labs in epic and the results pertinent to this consultation are: Pertinent abnormality per OB/GYN is the urine protein creatinine ratio, that can be indicative of preeclampsia although 24-hour urine protein is the gold standard test per Dr. Veto Kemps. Also was mildly hyponatremic, hypokalemia, mild hyperglycemia.  CBC    Component Value Date/Time   WBC 10.5 03/14/2017 2226   RBC 4.78 03/14/2017 2226   HGB 12.3 03/14/2017 2226   HCT 37.9 03/14/2017 2226   PLT 177 03/14/2017 2226   MCV  79.3 03/14/2017 2226   MCH 25.7 (L) 03/14/2017 2226   MCHC 32.5 03/14/2017 2226   RDW 14.0 03/14/2017 2226   LYMPHSABS 2.7 03/14/2017 2226   MONOABS 0.7 03/14/2017 2226   EOSABS 0.1 03/14/2017 2226   BASOSABS 0.0 03/14/2017 2226    CMP     Component Value Date/Time   NA 134 (L) 03/14/2017 2226   K 3.0 (L) 03/14/2017 2226   CL 106 03/14/2017 2226   CO2 20 (L) 03/14/2017 2226   GLUCOSE 133 (H) 03/14/2017 2226   BUN <5 (L) 03/14/2017 2226   CREATININE 0.56 03/14/2017 2226   CALCIUM 8.8 (L) 03/14/2017 2226   PROT 6.1 (L) 03/14/2017 2226   ALBUMIN 2.9 (L) 03/14/2017 2226   AST 24 03/14/2017 2226   ALT 16 03/14/2017 2226   ALKPHOS 58 03/14/2017 2226   BILITOT 0.3 03/14/2017 2226   GFRNONAA >60 03/14/2017 2226   GFRAA >60  03/14/2017 2226    Imaging - no brain imaging to review.  Assessment:  36 year old woman with past history of seizure disorder, migraines, hypothyroidism, history of cervical cancer currently [redacted] weeks pregnant had 2 seizures today. She is on Keppra 1500 twice a day as well as Lamictal 450 twice a day Clinical exam and labs not suggestive of any underlying infection. Per OB/GYN, laboratory findings of increased urine protein/creatinine ratio might be concerning for preeclampsia but 24-hour urine protein is required to confirm diagnosis. Patient endorses compliance to her medications but says that she has been having difficulty sleeping and has had some sleep deprivation from an nagging cough.  Impression: Breakthrough seizure in the setting of sleep deprivation in a patient with known seizure disorder. Evaluate for preeclampsia  Recommendations: -Continue with home doses of Keppra and Lamictal. -We will try to get in touch with outpatient neurologist Dr. [name] Heck who's cellphone number has been provided to [place] by the patient. Neurology service will attempt to reach him in the morning for his input since he has known the patient and followed her for a long  time. -Check Keppra and Lamictal levels. These tests are not so important to look for therapeutic effect but to look for compliance. -Maintain seizure precautions -From a neurological standpoint, there is no current indication for using magnesium drip. -Because of the new seizures, she should be evaluated with an MRI of the brain, given the fact she is pregnant a brain MRI without contrast should suffice.  Plan was communicated to Dr. Murrell Redden in person at [facility]. Please call with questions.   -- Amie Portland, MD [facility] [phone]  If 7pm to 7am, please call on call as listed on AMION.

## 2017-03-16 ENCOUNTER — Encounter: Payer: Self-pay | Admitting: Neurology

## 2017-03-16 ENCOUNTER — Observation Stay (HOSPITAL_COMMUNITY): Payer: 59

## 2017-03-16 DIAGNOSIS — O99353 Diseases of the nervous system complicating pregnancy, third trimester: Secondary | ICD-10-CM | POA: Diagnosis not present

## 2017-03-16 LAB — CREATININE CLEARANCE, URINE, 24 HOUR
COLLECTION INTERVAL-CRCL: 24 h
Creatinine Clearance: 210 mL/min — ABNORMAL HIGH (ref 75–115)
Creatinine, 24H Ur: 1118 mg/d (ref 600–1800)
Creatinine, Urine: 19.11 mg/dL
URINE TOTAL VOLUME-CRCL: 5850 mL

## 2017-03-16 LAB — COMPREHENSIVE METABOLIC PANEL
ALK PHOS: 64 U/L (ref 38–126)
ALT: 13 U/L — AB (ref 14–54)
AST: 15 U/L (ref 15–41)
Albumin: 2.9 g/dL — ABNORMAL LOW (ref 3.5–5.0)
Anion gap: 6 (ref 5–15)
BUN: <5 mg/dL — ABNORMAL LOW (ref 6–20)
CALCIUM: 7.1 mg/dL — AB (ref 8.9–10.3)
CO2: 22 mmol/L (ref 22–32)
CREATININE: 0.37 mg/dL — AB (ref 0.44–1.00)
Chloride: 107 mmol/L (ref 101–111)
Glucose, Bld: 118 mg/dL — ABNORMAL HIGH (ref 65–99)
Potassium: 4.2 mmol/L (ref 3.5–5.1)
SODIUM: 135 mmol/L (ref 135–145)
Total Bilirubin: 0.6 mg/dL (ref 0.3–1.2)
Total Protein: 5.5 g/dL — ABNORMAL LOW (ref 6.5–8.1)

## 2017-03-16 LAB — CBC
HCT: 32.8 % — ABNORMAL LOW (ref 36.0–46.0)
Hemoglobin: 11 g/dL — ABNORMAL LOW (ref 12.0–15.0)
MCH: 27 pg (ref 26.0–34.0)
MCHC: 33.5 g/dL (ref 30.0–36.0)
MCV: 80.6 fL (ref 78.0–100.0)
PLATELETS: 194 10*3/uL (ref 150–400)
RBC: 4.07 MIL/uL (ref 3.87–5.11)
RDW: 14.6 % (ref 11.5–15.5)
WBC: 12.2 10*3/uL — ABNORMAL HIGH (ref 4.0–10.5)

## 2017-03-16 LAB — PROTEIN, URINE, 24 HOUR
COLLECTION INTERVAL-UPROT: 24 h
Urine Total Volume-UPROT: 5850 mL

## 2017-03-16 MED ORDER — LAMOTRIGINE 150 MG PO TABS: 450.0000 mg | ORAL_TABLET | Freq: Three times a day (TID) | ORAL | 3 refills | Status: DC

## 2017-03-16 MED ORDER — ACETAMINOPHEN-CODEINE 120-12 MG/5ML PO SOLN: 10.0000 mL | Freq: Every day | ORAL | 0 refills | Status: AC

## 2017-03-16 MED ORDER — LEVETIRACETAM 250 MG PO TABS: 1750.0000 mg | ORAL_TABLET | Freq: Two times a day (BID) | ORAL | 3 refills | Status: DC

## 2017-03-16 MED ORDER — SPACER/AERO-HOLDING CHAMBERS DEVI: 1.0000 [IU] | Freq: Every day | 0 refills | Status: DC

## 2017-03-16 MED ORDER — FLUTICASONE PROPIONATE HFA 110 MCG/ACT IN AERO: 2.0000 | INHALATION_SPRAY | Freq: Every day | RESPIRATORY_TRACT | 12 refills | Status: DC

## 2017-03-16 MED ORDER — FLUTICASONE PROPIONATE HFA 110 MCG/ACT IN AERO: 2.0000 | INHALATION_SPRAY | Freq: Every day | RESPIRATORY_TRACT | 1 refills | Status: DC

## 2017-03-16 MED ORDER — LORATADINE 10 MG PO TABS: 10.0000 mg | ORAL_TABLET | Freq: Every day | ORAL | 1 refills | Status: DC

## 2017-03-16 NOTE — Progress Notes (Signed)
Patient ID: Kelly Mcclure, female   DOB: 01-21-1981, 36 y.o.   MRN: [id number] Subjective: No complaints. HA none today, noted dull headache before seizure. Does not recall 2nd seizure at [facility] after transfer on 8/12 early morning.   Objective: Vital signs in last 24 hours: Temp:  [98 F (36.7 C)-98.4 F (36.9 C)] 98.1 F (36.7 C) (08/13 1000) Pulse Rate:  [80-107] 97 (08/13 1000) Resp:  [16-18] 16 (08/13 1000) BP: (95-124)/(50-81) 118/67 (08/13 1000) SpO2:  [97 %-100 %] 97 % (08/13 1000) Weight change:  Last BM Date: 03/14/17  Intake/Output from previous day: 08/12 0701 - 08/13 0700 In: 5716.7 [P.O.:2680; I.V.:3026.7] Out: 5075 [Urine:5075] Intake/Output this shift: No intake/output data recorded.  Physical exam:  A&O x 3, no acute distress. Pleasant HEENT neg Lungs CTA bilat, no wheezing/ bronchial sounds CV RRR, S1S2 normal Abdo soft, non tender, non acute Extr no edema/ tenderness, SCDs on Pelvic n/a FHT  140s/ + accels/ no decels/ mod variab- reactive Toco none   Lab Results:  Recent Labs  03/15/17 0958 03/16/17 0510  WBC 12.8* 12.2*  HGB 11.5* 11.0*  HCT 35.1* 32.8*  PLT 178 194   BMET  Recent Labs  03/15/17 0958 03/16/17 0510  NA 138 135  K 3.1* 4.2  CL 107 107  CO2 21* 22  GLUCOSE 91 118*  BUN <5* <5*  CREATININE 0.37* 0.37*  CALCIUM 8.0* 7.1*   24 hr urine volume is high 24 hr urine protein <6 mg/dl 24 hr urine creatinine clearance high   AED recommendations while hospitalized: 1. Keppra 1750mg  PO  BID (total 3500 mg BID) 2. Lamictal 300 mg PO TID  AED upon discharge: 1. Keppra 1750mg  PO  BID 2. Lamictal-XR 450 mg PO BID  Other Meds:  Synthroid 175 mcg  Pantoprazole 40mg  qd Claritin Flonase PNvitamins  Assessment/Plan:  HD #2 28.1 wks, known epilepsy, here for 2 seizures (8/11 night, 8/12 early morning)- seizure free 24 hours 1) Epilepsy - [facility] inpt team following, new doses of AED as above, suspect seizure recurrence  from possible drug clearance - high renal clearance in preg, so planning extended release meds. Stable now.  R/o other causes of seizure with MRI pending this morning.  2) Unlikely eclampsia with essentially normal BPs, labs and 24 hr urine is back and normal, though high urine volume and creatinine clearance is high.  3) Prematurity - S/p BMTZ x 2 doses, Magnesium off 24 hrs 7 am today. Patient and husband reassured that possibly no fetal damage from seizure as there were not apnea events and FHT has been reactive since she was first placed on monitors. 4) Chronic cough- on Claritin and Flonase now, saw Pulmonologist and ENT 1 yr back for this, possible dust allergy but not resolved and may need inhaler or Singulare, but will see response to Claritin/ Flonase 5) FHT - reactive NST. Once back from MRI and if not new concerns, change to NST q shift.   Discuss discharge planning with close f/up with [place] outpt neuro (since her Neuro Dr [name] Heck has just retired)   Domino R 03/16/2017, 10:47 AM

## 2017-03-16 NOTE — Progress Notes (Signed)
Interim Note:  Was called by the patient's nurse Danny Lawless that she was being discharged. The patient has remained seizure free since admission.   While I have personally not seen the patient,  Dr Rory Percy discussed the case with me. I reviewed patient's MRI - no evidence of focal lesion, PRES.  I have reviewed her chart and the plan discussed earlier by Dr. Leonel Ramsay and Dr Rory Percy seems reasonable.    Seizures  Due to increased clearance of all antiepileptics in pregnancy,   - Agree with increasing Keppra to 1750 BID as recommended by Dr. Leonel Ramsay. -Also, for the Lamictal - would like to do Lamictal XR tablets 450 BID. I checked with pharmacy and they do not have the XR tabs on formulary and it might be a few days to try to get that. So alternatively, we would recommend doing Lamictal 300 mg TID and upon discharge she can be given a prescription for Lamictal-XR 450 BID (to keep a total daily dose 900 mg).   To summarize:  AED recommendations while hospitalized: 1. Keppra 1750mg  PO  BID (total 3500 mg BID) 2. Lamictal 300 mg PO TID  AED upon discharge: 1. Keppra 1750mg  PO  BID 2. Lamictal-XR 450 mg PO BID  Patient will need to see her neurologist Dr [name] Heck in next few weeks.    SEIZURE PRECAUTIONS   Per [facility] statutes, patients with seizures are not allowed to drive until they have been seizure-free for six months. Use caution when using heavy equipment or power tools. Avoid working on ladders or at heights. Take showers instead of baths. Ensure the water temperature is not too high on the home water heater. Do not go swimming alone. Do not lock yourself in a room alone (i.e. bathroom). When caring for infants or small children, sit down when holding, feeding, or changing them to minimize risk of injury to the child in the event you have a seizure. Maintain good sleep hygiene. Avoid alcohol.    If HAILYN ZARR has another seizure, call 911 and bring them back  to the ED if:       A.  The seizure lasts longer than 5 minutes.            B.  The patient doesn't wake shortly after the seizure or has new problems such as difficulty seeing, speaking or moving following the seizure       C.  The patient was injured during the seizure       D.  The patient has a temperature over 102 F (39C)       E.  The patient vomited during the seizure and now is having trouble breathing

## 2017-03-16 NOTE — Progress Notes (Signed)
Patient ready for discharge.  Instructions given and her medications was discussed.  She understands her discharge instructions and will follow up with neurologist and OB.

## 2017-03-16 NOTE — Progress Notes (Signed)
   03/15/17 0720  Vital Signs  BP 134/80  BP Location Left Arm  Patient Position (if appropriate) Lying  BP Method Automatic  Pulse Rate (!) 110  Oxygen Therapy  SpO2 96 %  O2 Device Room Air  Magnesium Sulfate 2 g/hr started @ 0722 because pt c/o of irritation to her two IV sites.Both PIV were removed and a new PIV started. No concern voiced at this time and site appears unremarkable. We will continue to monitor.

## 2017-03-17 ENCOUNTER — Encounter: Payer: Self-pay | Admitting: Internal Medicine

## 2017-03-17 LAB — LAMOTRIGINE LEVEL: LAMOTRIGINE LVL: 2.3 ug/mL (ref 2.0–20.0)

## 2017-03-18 LAB — MISC LABCORP TEST (SEND OUT): Labcorp test code: 716936

## 2017-03-19 ENCOUNTER — Encounter: Payer: Self-pay | Admitting: Neurology

## 2017-03-19 ENCOUNTER — Ambulatory Visit (INDEPENDENT_AMBULATORY_CARE_PROVIDER_SITE_OTHER): Payer: 59 | Admitting: Neurology

## 2017-03-19 DIAGNOSIS — G40909 Epilepsy, unspecified, not intractable, without status epilepticus: Secondary | ICD-10-CM | POA: Diagnosis not present

## 2017-03-19 MED ORDER — LAMOTRIGINE ER 50 MG PO TB24: 50.0000 mg | ORAL_TABLET | Freq: Two times a day (BID) | ORAL | 4 refills | Status: DC

## 2017-03-19 NOTE — Discharge Summary (Signed)
Patient ID: Kelly Mcclure MRN: [id number] DOB/AGE: 05-02-1981 36 y.o.  Admit date: 03/14/2017 Discharge date: 03/16/17  Admission Diagnoses: Hypokalemia [E87.6] Breakthrough seizure ([place]) [G40.919]  Discharge Diagnoses: Hypokalemia [E87.6] Breakthrough seizure ([place]) [G40.919]         Discharged Condition: stable  Hospital Course: Patient was assessed in the [facility] ED after a tonic-clonic seizure from home. Once cleared from another neurologic standpoint patient was transferred to [facility] for further fetal monitoring and evaluation. Upon arrival to [facility] patient had a second tonic-clonic seizure this time lasting just about 1 minutes. Patient seizure was too quick to allow any benzodiazepine therapy. Patient had consultation with neurology who felt her seizures were due to increasing stress of pregnancy and likely subtherapeutic levels due to her increased clearance. Patient had evaluation for preeclampsia which was negative with normal labs and very low protein on 24-hour urine specimen. Despite this patient did receive magnesium sulfate both first seizure prophylaxis and no prophylaxis for the fetus. She did get a course of betamethasone as well as. She had a head MRI with no acute findings. Neurology did recommend increasing both her Keppra and her Lamictal. Patient had been noting chronic cough and poor sleep due to the cough. Patient was started on Flonase while in the hospital was transitioned to Flovent with a spacer for likely reactive airway disease. Given her cough she was also given guaifenesin with codeine. Fetal testing remained reactive.   Consults: Neurology and MFM  Treatments: IV hydration and steroids for fetal lung maturity, magnesium sulfate, fetal monitoring, change in her home antiepileptics  Disposition: home   Allergies as of 03/16/2017      Reactions   Sulfa Antibiotics    Tongue swelling, throat irritated   Adhesive [tape]    Some bandaids  cause skin irritation   Other Swelling   Magic mouth wash/ causes swelling of the tongue   Relpax [eletriptan Hydrobromide]    Stroke-like symptoms   Ultram [tramadol Hcl]    Seizure   Ultram [tramadol] Other (See Comments)   seizures      Medication List    STOP taking these medications   indomethacin 25 MG capsule Commonly known as:  INDOCIN   lamoTRIgine 150 MG tablet Commonly known as:  LAMICTAL   levETIRAcetam 500 MG tablet Commonly known as:  KEPPRA   ondansetron 4 MG tablet Commonly known as:  ZOFRAN   oxyCODONE-acetaminophen 7.5-325 MG tablet Commonly known as:  PERCOCET   pantoprazole 40 MG tablet Commonly known as:  PROTONIX     TAKE these medications   acetaminophen 500 MG tablet Commonly known as:  TYLENOL Take 1,000 mg by mouth as needed.   acetaminophen-codeine 120-12 MG/5ML solution Take 10 mLs by mouth at bedtime.   fluticasone 110 MCG/ACT inhaler Commonly known as:  FLOVENT HFA Inhale 2 puffs into the lungs daily.   loratadine 10 MG tablet Commonly known as:  CLARITIN Take 1 tablet (10 mg total) by mouth daily.   multivitamin-prenatal 27-0.8 MG Tabs tablet Take 1 tablet by mouth daily at 12 noon.   Spacer/Aero-Holding [employer] 1 Units by Does not apply route daily.   SYNTHROID 175 MCG tablet Generic drug:  levothyroxine Take 1 tablet (175 mcg total) by mouth daily before breakfast.        Signed: Ala Dach., MD MD 03/19/2017, 4:57 PM

## 2017-03-19 NOTE — Progress Notes (Signed)
PATIENT: Kelly Mcclure DOB: 1981-01-09  Chief Complaint  Patient presents with  . New Patient (Initial Visit)    pt has hx of seizure since 2002, on same medication for 10 years, last tonic clonic seizure was 2004. on sat pt had two seizures on this past sat.      HISTORICAL  SHAWNDA Mcclure is a 36 years old right-handed female, accompanied by her husband Legrand Como, seen in refer by her obstetrician Dr.Taavon, Richard,for evaluation of seizure, initial evaluation was on March 19 2017  She had past medical history of cervical cancer, GERD, radioactive iodine thyroid ablation, hypothyroidism, chronic migraine, history of kidney stone, epilepsy, she is currently [redacted] weeks pregnant  She was diagnosed with epilepsy since 2002, has been under the care of [facility] neurologist Dr. Harrie Foreman.  I reviewed the most recent evaluation by Dr. Harrie Foreman on January 23 2017,   In June of 2002, she presented her first generalized tonic-clonic seizure while having the vacation at the beach, this happened in the setting of sleep deprivation, mild alcohol use  MRI of the brain, MRA of the brain showed no significant abnormality.  EEG at that time demonstrate generalized epileptiform discharge per description. She was started on Depakote 250 mg 3 times a day, she reported significant weight gain within short period of time, then she was switched to Topamax for extended period of time untill 2011, she was diagnosed with kidney stone.  She was switched to lamotrigine since 2011, at later multiple follow-up visits, there was description of spacing out spells, no myoclonic jerking, Keppra was added on later, which has helped her spells.but she still has occasionally recurrent spells  She  has been doing well taking Keppra 500 mg 3 tablets twice a day, lamotrigine 150 mg 3 tablets twice a day,she is currently [redacted] weeks pregnant, but still has occasionally space out spells, eyes rolled back, transient lapse  of time, lasting few seconds.  On March 14 2017, her husband heard a loud screaming noise, saw patient body tense up, foaming coming out of her mouth,lasting for few minutes, was taken to the emergency room by ambulance  Laboratory evaluations UA showed large amount of leukocytes, protein was elevated 100,UDS was negative, CBC was within normal limits, hemoglobin was 12.3,CMP showed low potassium 3.0,creatinine 0.56  Lamictal level was 2.3, 24 hours urine protein was less than 6, within normal limit  I personally reviewed MRI of the brain without contrast on March 16 2017,that was normal  She was given IV magnesium infusion, now she is on higher dose of lamotrigine XR 400 plus 50 mg twice a day, Keppra 1000 plus 750 mg twice a day, she tolerated the medication well,  REVIEW OF SYSTEMS: Full 14 system review of systems performed and notable only for memory loss, confusion, headache, seizure, sleepiness, shortness of breath, cough, wheezing, trouble swallowing,  ALLERGIES: Allergies  Allergen Reactions  . Sulfa Antibiotics     Tongue swelling, throat irritated  . Adhesive [Tape]     Some bandaids cause skin irritation  . Other Swelling    Magic mouth wash/ causes swelling of the tongue  . Relpax [Eletriptan Hydrobromide]     Stroke-like symptoms  . Ultram [Tramadol Hcl]     Seizure  . Ultram [Tramadol] Other (See Comments)    seizures    HOME MEDICATIONS: Current Outpatient Prescriptions  Medication Sig Dispense Refill  . acetaminophen (TYLENOL) 500 MG tablet Take 1,000 mg by mouth as needed.    Marland Kitchen  acetaminophen-codeine 120-12 MG/5ML solution Take 10 mLs by mouth at bedtime. 120 mL 0  . fluticasone (FLOVENT HFA) 110 MCG/ACT inhaler Inhale 2 puffs into the lungs daily. 1 Inhaler 1  . lamoTRIgine (LAMICTAL) 150 MG tablet Take 3 tablets (450 mg total) by mouth every 8 (eight) hours. TAKES 3 TABLETS every 8 hours 270 tablet 3  . levETIRAcetam (KEPPRA) 250 MG tablet Take 7 tablets  (1,750 mg total) by mouth 2 (two) times daily. TAKES 3 TABLETS TWICE DAILY 420 tablet 3  . loratadine (CLARITIN) 10 MG tablet Take 1 tablet (10 mg total) by mouth daily. 90 tablet 1  . Prenatal Vit-Fe Fumarate-FA (MULTIVITAMIN-PRENATAL) 27-0.8 MG TABS tablet Take 1 tablet by mouth daily at 12 noon.    Marland Kitchen Spacer/Aero-Holding [employer] DEVI 1 Units by Does not apply route daily. 1 each 0  . SYNTHROID 175 MCG tablet Take 1 tablet (175 mcg total) by mouth daily before breakfast. 30 tablet 3   No current facility-administered medications for this visit.     PAST MEDICAL HISTORY: Past Medical History:  Diagnosis Date  . Cancer (HCC)    hx of cervical cancer  . Complication of anesthesia    after some surgeries - pt woke up with migraine  . GERD (gastroesophageal reflux disease)    with pregnancy  . H/O radioactive iodine thyroid ablation 12/2009  . 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 History of kidney stones 2011  . Hypothyroidism   . Migraines   . Seizure disorder ([place])    Diagnosed at age 23  . Seizures ([place])    clonic tonic seizures - will "space out " for a very brief moment.  take medication for seizures. HX OF MAJOR SEIZURES - LOOSING CONSCIOUSNESS-- APPROX 10 TO 12 YRS AGO- NEUROLOGIST IS DR. Raliegh Ip. [facility] [place], absent seizures  . 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 ABDOMINAL CERCLAGE N/A 11/19/2016   Procedure: LAPAROSCOPIC TRANSABDOMINAL CERVICAL-ISTHMUSx2;  Surgeon: Governor Specking, MD;  Location: [facility];  Service: Gynecology;  Laterality: N/A;  with ultrasound guidance. Start laparoscopically HOLD OPEN ABDOMINAL ITEMS   . ADENOIDECTOMY  1995  . CERCLAGE LAPAROSCOPIC ABDOMINAL N/A 11/19/2016   Procedure: CERCLAGE LAPAROSCOPIC ABDOMINAL;  Surgeon: Governor Specking, MD;  Location: [facility];  Service: Gynecology;  Laterality: N/A;  . cold knife conization     . DILATION AND CURETTAGE OF UTERUS  2009  . DILATION AND CURETTAGE OF UTERUS N/A 03/01/2013   Procedure: CERVICAL DILATATION AND ENDOCERVICAL CURRETAGE AND VAAGINAL BIOPIES;  Surgeon: Alvino Chapel, MD;  Location: [facility];  Service: Gynecology;  Laterality: N/A;  . LEEP  02/25/2010  . LEEP  07/25/11  . MOUTH SURGERY    . NASAL SEPTUM SURGERY  2006   Repair of deviated septum  . SIGMOIDOSCOPY    . TONSILLECTOMY  1995    FAMILY HISTORY: Family History  Problem Relation Age of Onset  . Leukemia Other   . Diabetes Other   . Cervical cancer Mother   . Diabetes Mother   . Skin cancer Mother   . Hyperlipidemia Mother   . Hypertension Father   . Asthma Father   . Skin cancer Maternal Aunt   . Multiple myeloma Maternal Aunt   . Testicular cancer Maternal Uncle   . Hyperlipidemia Maternal Grandmother   . Prostate cancer Maternal Grandfather     SOCIAL HISTORY:  Social History   Social History  . Marital status: Single    Spouse name: N/A  . Number of children: N/A  . Years of education: N/A   Occupational History  . 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   Social History Main Topics  . Smoking status: Never Smoker  . Smokeless tobacco: Never Used  . Alcohol use No     Comment: Rare  . Drug use: No  . Sexual activity: Yes   Other Topics Concern  . Not on file   Social History Narrative  . No narrative on file     PHYSICAL EXAM   Vitals:   03/19/17 0724  BP: 117/80  Pulse: (!) 104  Weight: 171 lb (77.6 kg)  Height: '5\' 5"'  (1.651 m)    Not recorded      Body mass index is 28.46 kg/m.  PHYSICAL EXAMNIATION:  Gen: NAD, conversant, well nourised, obese, well groomed                     Cardiovascular: Regular rate rhythm, no peripheral edema, warm, nontender. Eyes: Conjunctivae clear without exudates or hemorrhage Neck: Supple, no carotid bruits. Pulmonary: Clear to auscultation bilaterally   NEUROLOGICAL EXAM:  MENTAL STATUS: Speech:    Speech  is normal; fluent and spontaneous with normal comprehension.  Cognition:     Orientation to time, place and person     Normal recent and remote memory     Normal Attention span and concentration     Normal Language, naming, repeating,spontaneous speech     Fund of knowledge   CRANIAL NERVES: CN II: Visual fields are full to confrontation. Fundoscopic exam is normal with sharp discs and no vascular changes. Pupils are round equal and briskly reactive to light. CN III, IV, VI: extraocular movement are normal. No ptosis. CN V: Facial sensation is intact to pinprick in all 3 divisions bilaterally. Corneal responses are intact.  CN VII: Face is symmetric with normal eye closure and smile. CN VIII: Hearing is normal to rubbing fingers CN IX, X: Palate elevates symmetrically. Phonation is normal. CN XI: Head turning and shoulder shrug are intact CN XII: Tongue is midline with normal movements and no atrophy.  MOTOR: There is no pronator drift of out-stretched arms. Muscle bulk and tone are normal. Muscle strength is normal.  REFLEXES: Reflexes are 2+ and symmetric at the biceps, triceps, knees, and ankles. Plantar responses are flexor.  SENSORY: Intact to light touch, pinprick, positional sensation and vibratory sensation are intact in fingers and toes.  COORDINATION: Rapid alternating movements and fine finger movements are intact. There is no dysmetria on finger-to-nose and heel-knee-shin.    GAIT/STANCE: Posture is normal. Gait is steady with normal steps, base, arm swing, and turning. Heel and toe walking are normal. Tandem gait is normal.  Romberg is absent.   DIAGNOSTIC DATA (LABS, IMAGING, TESTING) - I reviewed patient records, labs, notes, testing and imaging myself where available.   ASSESSMENT AND PLAN  JLEE HARKLESS is a 36 y.o. 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  Marcial Pacas, M.D. Ph.D.  [facility] [address] [place], [place] [address] Ph: [phone] Fax: [phone]  CC: Referring Provider

## 2017-03-20 ENCOUNTER — Telehealth: Payer: Self-pay | Admitting: Neurology

## 2017-03-20 ENCOUNTER — Encounter: Payer: Self-pay | Admitting: Internal Medicine

## 2017-03-20 ENCOUNTER — Ambulatory Visit (INDEPENDENT_AMBULATORY_CARE_PROVIDER_SITE_OTHER): Payer: 59 | Admitting: Internal Medicine

## 2017-03-20 DIAGNOSIS — E890 Postprocedural hypothyroidism: Secondary | ICD-10-CM

## 2017-03-20 LAB — URINALYSIS, ROUTINE W REFLEX MICROSCOPIC
Bilirubin, UA: NEGATIVE
Glucose, UA: NEGATIVE
Ketones, UA: NEGATIVE
NITRITE UA: NEGATIVE
RBC, UA: NEGATIVE
Specific Gravity, UA: 1.02 (ref 1.005–1.030)
Urobilinogen, Ur: 0.2 mg/dL (ref 0.2–1.0)
pH, UA: 6.5 (ref 5.0–7.5)

## 2017-03-20 LAB — MICROSCOPIC EXAMINATION: CASTS: NONE SEEN /LPF

## 2017-03-20 LAB — T4, FREE: Free T4: 0.91 ng/dL (ref 0.60–1.60)

## 2017-03-20 LAB — TSH: TSH: 5.78 u[IU]/mL — AB (ref 0.35–4.50)

## 2017-03-20 NOTE — Patient Instructions (Signed)
Please continue Synthroid 175 mcg daily.  Take the thyroid hormone every day, with water, at least 30 minutes before breakfast, separated by at least 4 hours from: - acid reflux medications - calcium - iron - multivitamins  Please stop at the lab.  Please come back for a follow-up appointment in 6 months but please have labs drawn 1 month after you give birth.

## 2017-03-20 NOTE — Telephone Encounter (Signed)
Spoke to pt and relayed the results of urine test as per below per DR. Krista Blue.  She verbalized understanding of this and also to increase water intake.
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
## 2017-03-20 NOTE — Telephone Encounter (Signed)
Please call patient, urine analysis was slightly off normal range, mildly cloudy, 1 plus leukocyte, 6-10 WBC under microscopic examination, but not enough evidence to make a diagnosis of urinary tract infection.  She should increase water intake

## 2017-03-23 ENCOUNTER — Other Ambulatory Visit: Payer: Self-pay | Admitting: Internal Medicine

## 2017-03-23 ENCOUNTER — Encounter: Payer: Self-pay | Admitting: Internal Medicine

## 2017-03-23 DIAGNOSIS — E890 Postprocedural hypothyroidism: Secondary | ICD-10-CM

## 2017-03-29 ENCOUNTER — Encounter: Payer: Self-pay | Admitting: Internal Medicine

## 2017-03-29 ENCOUNTER — Encounter: Payer: Self-pay | Admitting: Neurology

## 2017-03-30 ENCOUNTER — Telehealth: Payer: Self-pay | Admitting: Neurology

## 2017-03-30 NOTE — Telephone Encounter (Signed)
Pt called she sent an email yesterday and is wanting to know how to prepare for EEG. Please see email. Thank you

## 2017-04-01 ENCOUNTER — Ambulatory Visit (INDEPENDENT_AMBULATORY_CARE_PROVIDER_SITE_OTHER): Payer: 59 | Admitting: Neurology

## 2017-04-01 DIAGNOSIS — G40909 Epilepsy, unspecified, not intractable, without status epilepticus: Secondary | ICD-10-CM

## 2017-04-03 ENCOUNTER — Telehealth: Payer: Self-pay | Admitting: Neurology

## 2017-04-03 LAB — LAMOTRIGINE LEVEL: LAMOTRIGINE LVL: 4.3 ug/mL (ref 2.0–20.0)

## 2017-04-03 LAB — LEVETIRACETAM LEVEL: Levetiracetam Lvl: 10.9 ug/mL (ref 10.0–40.0)

## 2017-04-03 NOTE — Procedures (Addendum)
   HISTORY: 36 years old female, with history of epilepsy.  TECHNIQUE:  16 channel EEG was performed based on standard 10-16 international system. One channel was dedicated to EKG, which has demonstrates normal sinus rhythm of 90 beats per minutes.  Upon awakening, the posterior background activity was well-developed, in alpha range, reactive to eye opening and closure.  There was frequent appearance of generalized epileptiform discharge, following photic stimulation, during drowsiness, short lasting less than 5 seconds, there was no clinical seizure activity documented.  Photic stimulation was performed, which induced a symmetric photic driving, there was increased frequency of generalized epileptiform discharges following photic stimulations.  Hyperventilation was not performed due to patient pregnancy.  Stage II sleep was achieved.  CONCLUSION: This is an abnormal awake and asleep EEG.  There is electrodiagnostic evidence of frequent  generalized epileptiform discharges.  Marcial Pacas, M.D. Ph.D.  [facility] [place], [place] [address] Phone: [phone] Fax:      [phone]

## 2017-04-03 NOTE — Telephone Encounter (Signed)
I called her, EEG showed frequent generalized epileptiform discharge, emphasized importance of complying with her medications, Lamotrigine level was 4.3, which has increased compared to previous 2.3, Keppra level is pending,  She is to call office for any questions

## 2017-04-07 ENCOUNTER — Encounter: Payer: Self-pay | Admitting: Neurology

## 2017-04-08 ENCOUNTER — Encounter: Payer: Self-pay | Admitting: Neurology

## 2017-04-09 ENCOUNTER — Telehealth: Payer: Self-pay | Admitting: Neurology

## 2017-04-09 NOTE — Telephone Encounter (Signed)
Hi. I have a few questions since receiving the results of my blood work and EEG.    Do the levels of my medication (after having the trough levels drawn) need to be in the higher range of normal? It looks like they were on the lower end of normal.   No need to push up to higher level of medications, as long as she does not have clinical seizure. We treat patient, not eeg or lab result. Higher dose could cause more side effect.  Should I be followed more frequently considering my pregnancy and the fact of having a major seizure episode after almost 15 years of no tonic clonic seizure activity?   It is ok to move up her follow up appt.   What, if anything, should change post partum? Testing?   We may continue monitoring clinically and medicine level, may needs to go down on the dosage slightly.   If you have my previous EEG, what is the comparison like? Increased or decreased activity? The same?   I have no access to previous EEG, may consider repeat EEG post partum.   I was told my condition is subclinical - what does that mean exactly?  There is abnormal EEG discharge, but no clinical seizure activity.   What is normal for a patient with a history of seizures?   Often times, we see abnormal discharge on EEG recording, but no clinical seizure, patient do have increased chance of having a clinical seizure.  Thank you.     Philippa Sicks

## 2017-04-09 NOTE — Telephone Encounter (Signed)
Spoke to patient - reviewed all questions and MD answers.  She verbalized understanding of all responses.  She wants to keep her current pending appt.  She was appreciative of Dr. Krista Blue taking time to address her multiple concerns.

## 2017-04-21 ENCOUNTER — Other Ambulatory Visit: Payer: Self-pay | Admitting: Internal Medicine

## 2017-04-30 ENCOUNTER — Encounter: Payer: Self-pay | Admitting: Internal Medicine

## 2017-05-04 ENCOUNTER — Encounter: Payer: Self-pay | Admitting: Internal Medicine

## 2017-05-04 NOTE — Progress Notes (Signed)
Received labs from PCP >> all normal:  04/30/2017: TSH 1.4, free T4 1.48, free T3 3.1

## 2017-05-08 ENCOUNTER — Encounter: Payer: Self-pay | Admitting: Neurology

## 2017-05-13 ENCOUNTER — Encounter (HOSPITAL_COMMUNITY): Payer: Self-pay

## 2017-05-13 ENCOUNTER — Telehealth (HOSPITAL_COMMUNITY): Payer: Self-pay | Admitting: *Deleted

## 2017-05-13 ENCOUNTER — Ambulatory Visit: Payer: 59 | Admitting: Neurology

## 2017-05-13 NOTE — Telephone Encounter (Signed)
Preadmission screen  

## 2017-05-19 ENCOUNTER — Other Ambulatory Visit: Payer: Self-pay | Admitting: Obstetrics and Gynecology

## 2017-05-21 ENCOUNTER — Encounter: Payer: Self-pay | Admitting: Internal Medicine

## 2017-05-21 ENCOUNTER — Encounter: Payer: Self-pay | Admitting: Neurology

## 2017-05-22 ENCOUNTER — Telehealth: Payer: Self-pay | Admitting: Neurology

## 2017-05-22 DIAGNOSIS — G40909 Epilepsy, unspecified, not intractable, without status epilepticus: Secondary | ICD-10-CM

## 2017-05-22 NOTE — Telephone Encounter (Signed)
From: Loralie Champagne  Sent: 05/21/2017  7:31 PM  To: [facility]  Subject: Non-Urgent Medical Question             Hi Dr. Krista Blue. I just had my last appointment with my OB before my c-section on Wednesday the 24th. He asked me to reach out to you about lab work and medication dosages post partum. He wanted to see how long after delivery you wanted to wait to check my levels seeing as my blood volume will have decreased and possibly decreasing my medication.     Please let me know your thoughts and I'll relay this information. Thank you!    Kelly Mcclure    Please call her, I have ordered Keppra and lamotrigine level, she should come in for trough level, do not take her morning dose of medication, check level first, and then take medications after lab draw. This will serve as baseline, then check level 1-2 weeks postpartum, we can potentially adjust medication accordingly. And we can have another sets of level on her scheduled follow up visit on Jun 23 2017.

## 2017-05-22 NOTE — Telephone Encounter (Signed)
Left detailed message (ok per DPR) requesting her to come in on Monday for labs prior to taking her morning doses of medication.  I requested her to call me back Monday to let me know she received this message.  I will follow up.

## 2017-05-22 NOTE — Telephone Encounter (Signed)
Attempted to reach again before leaving.  I will try her again on Monday morning.

## 2017-05-25 ENCOUNTER — Other Ambulatory Visit (INDEPENDENT_AMBULATORY_CARE_PROVIDER_SITE_OTHER): Payer: Self-pay

## 2017-05-25 DIAGNOSIS — Z0289 Encounter for other administrative examinations: Secondary | ICD-10-CM

## 2017-05-25 DIAGNOSIS — G40909 Epilepsy, unspecified, not intractable, without status epilepticus: Secondary | ICD-10-CM

## 2017-05-25 NOTE — Patient Instructions (Signed)
Kelly Mcclure  05/25/2017   Your procedure is scheduled on:  05/26/2017  Enter through the Main Entrance of [facility] up the phone at the desk and dial [phone]  Call this number if you have problems the morning of surgery:[phone]  Remember:   Do not eat food:After Midnight.  Do not drink clear liquids: After Midnight.  Take these medicines the morning of surgery with A SIP OF WATER: take your regular doses of Keppra and lamictal.  Also, take your regular dose of synthroid   Do not wear jewelry, make-up or nail polish.  Do not wear lotions, powders, or perfumes. Do not wear deodorant.  Do not shave 48 hours prior to surgery.  Do not bring valuables to the hospital.  [facility] is not   responsible for any belongings or valuables brought to the hospital.  Contacts, dentures or bridgework may not be worn into surgery.  Leave suitcase in the car. After surgery it may be brought to your room.  For patients admitted to the hospital, checkout time is 11:00 AM the day of              discharge.    N/A   Please read over the following fact sheets that you were given:   Surgical Site Infection Prevention

## 2017-05-25 NOTE — Telephone Encounter (Signed)
Noted  

## 2017-05-25 NOTE — Telephone Encounter (Signed)
Left another message with lab instructions.  Requested she call me back to confirm she received the message.

## 2017-05-25 NOTE — Telephone Encounter (Signed)
Patient called and advised she did get your message and had labs done this morning.

## 2017-05-26 ENCOUNTER — Encounter (HOSPITAL_COMMUNITY)
Admission: RE | Admit: 2017-05-26 | Discharge: 2017-05-26 | Disposition: A | Discharge status: Home / Self Care | Payer: 59 | Source: Ambulatory Visit | Attending: Obstetrics and Gynecology | Admitting: Obstetrics and Gynecology

## 2017-05-26 HISTORY — DX: Unspecified abnormal cytological findings in specimens from vagina: R87.629

## 2017-05-26 HISTORY — DX: Personal history of other infectious and parasitic diseases: Z86.19

## 2017-05-26 LAB — CBC
HEMATOCRIT: 37.5 % (ref 36.0–46.0)
HEMOGLOBIN: 12.6 g/dL (ref 12.0–15.0)
MCH: 27.4 pg (ref 26.0–34.0)
MCHC: 33.6 g/dL (ref 30.0–36.0)
MCV: 81.5 fL (ref 78.0–100.0)
Platelets: 175 10*3/uL (ref 150–400)
RBC: 4.6 MIL/uL (ref 3.87–5.11)
RDW: 15.3 % (ref 11.5–15.5)
WBC: 9.8 10*3/uL (ref 4.0–10.5)

## 2017-05-26 LAB — LAMOTRIGINE LEVEL: Lamotrigine Lvl: 5.9 ug/mL (ref 2.0–20.0)

## 2017-05-26 LAB — LEVETIRACETAM LEVEL: LEVETIRACETAM: 14 ug/mL (ref 10.0–40.0)

## 2017-05-26 LAB — TYPE AND SCREEN
ABO/RH(D): A POS
Antibody Screen: NEGATIVE
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
## 2017-05-27 ENCOUNTER — Inpatient Hospital Stay (HOSPITAL_COMMUNITY): Payer: 59 | Admitting: Anesthesiology

## 2017-05-27 ENCOUNTER — Encounter (HOSPITAL_COMMUNITY)
Admission: AD | Disposition: A | Discharge status: Home / Self Care | Payer: Self-pay | Source: Ambulatory Visit | Attending: Obstetrics and Gynecology

## 2017-05-27 ENCOUNTER — Encounter (HOSPITAL_COMMUNITY): Payer: Self-pay | Admitting: *Deleted

## 2017-05-27 ENCOUNTER — Inpatient Hospital Stay (HOSPITAL_COMMUNITY)
Admission: AD | Admit: 2017-05-27 | Discharge: 2017-05-29 | DRG: 787 | Disposition: A | Discharge status: Home / Self Care | Payer: 59 | Source: Ambulatory Visit | Attending: Obstetrics and Gynecology | Admitting: Obstetrics and Gynecology

## 2017-05-27 DIAGNOSIS — E039 Hypothyroidism, unspecified: Secondary | ICD-10-CM | POA: Diagnosis present

## 2017-05-27 DIAGNOSIS — O26893 Other specified pregnancy related conditions, third trimester: Principal | ICD-10-CM | POA: Diagnosis present

## 2017-05-27 DIAGNOSIS — Z8541 Personal history of malignant neoplasm of cervix uteri: Secondary | ICD-10-CM | POA: Diagnosis not present

## 2017-05-27 DIAGNOSIS — D649 Anemia, unspecified: Secondary | ICD-10-CM | POA: Diagnosis present

## 2017-05-27 DIAGNOSIS — O99284 Endocrine, nutritional and metabolic diseases complicating childbirth: Secondary | ICD-10-CM | POA: Diagnosis present

## 2017-05-27 DIAGNOSIS — F419 Anxiety disorder, unspecified: Secondary | ICD-10-CM | POA: Diagnosis present

## 2017-05-27 DIAGNOSIS — G40909 Epilepsy, unspecified, not intractable, without status epilepticus: Secondary | ICD-10-CM | POA: Diagnosis present

## 2017-05-27 DIAGNOSIS — Z3A38 38 weeks gestation of pregnancy: Secondary | ICD-10-CM

## 2017-05-27 DIAGNOSIS — O99354 Diseases of the nervous system complicating childbirth: Secondary | ICD-10-CM | POA: Diagnosis present

## 2017-05-27 DIAGNOSIS — O09299 Supervision of pregnancy with other poor reproductive or obstetric history, unspecified trimester: Secondary | ICD-10-CM

## 2017-05-27 DIAGNOSIS — Z9889 Other specified postprocedural states: Secondary | ICD-10-CM

## 2017-05-27 DIAGNOSIS — O99344 Other mental disorders complicating childbirth: Secondary | ICD-10-CM | POA: Diagnosis present

## 2017-05-27 DIAGNOSIS — O9902 Anemia complicating childbirth: Secondary | ICD-10-CM | POA: Diagnosis present

## 2017-05-27 LAB — RPR: RPR: NONREACTIVE

## 2017-05-27 SURGERY — Surgical Case
Anesthesia: Spinal | Wound class: Clean Contaminated

## 2017-05-27 MED ORDER — NALBUPHINE HCL 10 MG/ML IJ SOLN: 5.0000 mg | Freq: Once | INTRAMUSCULAR | Status: DC | PRN

## 2017-05-27 MED ORDER — PRENATAL MULTIVITAMIN CH
1.0000 | ORAL_TABLET | Freq: Every day | ORAL | Status: DC
  Administered 2017-05-28: 1 via ORAL
  Filled 2017-05-27: qty 1

## 2017-05-27 MED ORDER — NALOXONE HCL 0.4 MG/ML IJ SOLN: 0.4000 mg | INTRAMUSCULAR | Status: DC | PRN

## 2017-05-27 MED ORDER — SIMETHICONE 80 MG PO CHEW
80.0000 mg | CHEWABLE_TABLET | Freq: Three times a day (TID) | ORAL | Status: DC
  Administered 2017-05-27 – 2017-05-29 (×6): 80 mg via ORAL
  Filled 2017-05-27 (×5): qty 1

## 2017-05-27 MED ORDER — DIBUCAINE 1 % RE OINT: 1.0000 "application " | TOPICAL_OINTMENT | RECTAL | Status: DC | PRN

## 2017-05-27 MED ORDER — LAMOTRIGINE ER 200 MG PO TB24: 400.0000 mg | ORAL_TABLET | Freq: Two times a day (BID) | ORAL | Status: DC

## 2017-05-27 MED ORDER — WITCH HAZEL-GLYCERIN EX PADS: 1.0000 "application " | MEDICATED_PAD | CUTANEOUS | Status: DC | PRN

## 2017-05-27 MED ORDER — MEPERIDINE HCL 25 MG/ML IJ SOLN: 6.2500 mg | INTRAMUSCULAR | Status: DC | PRN

## 2017-05-27 MED ORDER — METHYLERGONOVINE MALEATE 0.2 MG PO TABS: 0.2000 mg | ORAL_TABLET | ORAL | Status: DC | PRN

## 2017-05-27 MED ORDER — OXYCODONE-ACETAMINOPHEN 5-325 MG PO TABS
1.0000 | ORAL_TABLET | ORAL | Status: DC | PRN
  Administered 2017-05-28: 1 via ORAL
  Filled 2017-05-27: qty 1

## 2017-05-27 MED ORDER — DIPHENHYDRAMINE HCL 25 MG PO CAPS
25.0000 mg | ORAL_CAPSULE | ORAL | Status: DC | PRN
  Administered 2017-05-27 – 2017-05-28 (×2): 25 mg via ORAL
  Filled 2017-05-27 (×3): qty 1

## 2017-05-27 MED ORDER — CEFAZOLIN SODIUM-DEXTROSE 2-4 GM/100ML-% IV SOLN
2.0000 g | INTRAVENOUS | Status: AC
  Administered 2017-05-27: 2 g via INTRAVENOUS

## 2017-05-27 MED ORDER — SENNOSIDES-DOCUSATE SODIUM 8.6-50 MG PO TABS
2.0000 | ORAL_TABLET | ORAL | Status: DC
  Administered 2017-05-27 – 2017-05-28 (×2): 2 via ORAL
  Filled 2017-05-27 (×2): qty 2

## 2017-05-27 MED ORDER — LACTATED RINGERS IV SOLN
INTRAVENOUS | Status: DC
  Administered 2017-05-27 (×2): via INTRAVENOUS

## 2017-05-27 MED ORDER — ONDANSETRON HCL 4 MG/2ML IJ SOLN: 4.0000 mg | Freq: Three times a day (TID) | INTRAMUSCULAR | Status: DC | PRN

## 2017-05-27 MED ORDER — LACTATED RINGERS IV SOLN
INTRAVENOUS | Status: DC | PRN
  Administered 2017-05-27: 10:00:00 via INTRAVENOUS

## 2017-05-27 MED ORDER — OXYTOCIN 40 UNITS IN LACTATED RINGERS INFUSION - SIMPLE MED: 2.5000 [IU]/h | INTRAVENOUS | Status: DC

## 2017-05-27 MED ORDER — PHENYLEPHRINE 8 MG IN D5W 100 ML (0.08MG/ML) PREMIX OPTIME
INJECTION | INTRAVENOUS | Status: DC | PRN
  Administered 2017-05-27: 60 ug/min via INTRAVENOUS

## 2017-05-27 MED ORDER — ONDANSETRON HCL 4 MG/2ML IJ SOLN
INTRAMUSCULAR | Status: AC
  Filled 2017-05-27: qty 2

## 2017-05-27 MED ORDER — SCOPOLAMINE 1 MG/3DAYS TD PT72
MEDICATED_PATCH | TRANSDERMAL | Status: DC | PRN
  Administered 2017-05-27: 1 via TRANSDERMAL

## 2017-05-27 MED ORDER — DIPHENHYDRAMINE HCL 50 MG/ML IJ SOLN: 12.5000 mg | INTRAMUSCULAR | Status: DC | PRN

## 2017-05-27 MED ORDER — MENTHOL 3 MG MT LOZG: 1.0000 | LOZENGE | OROMUCOSAL | Status: DC | PRN

## 2017-05-27 MED ORDER — TETANUS-DIPHTH-ACELL PERTUSSIS 5-2.5-18.5 LF-MCG/0.5 IM SUSP: 0.5000 mL | Freq: Once | INTRAMUSCULAR | Status: DC

## 2017-05-27 MED ORDER — BUPIVACAINE HCL (PF) 0.25 % IJ SOLN
INTRAMUSCULAR | Status: DC | PRN
  Administered 2017-05-27: 20 mL

## 2017-05-27 MED ORDER — HYDROMORPHONE HCL 1 MG/ML IJ SOLN
0.2500 mg | INTRAMUSCULAR | Status: DC | PRN
  Administered 2017-05-27 (×2): 0.25 mg via INTRAVENOUS

## 2017-05-27 MED ORDER — FENTANYL CITRATE (PF) 100 MCG/2ML IJ SOLN
INTRAMUSCULAR | Status: DC | PRN
  Administered 2017-05-27 (×2): 50 ug via INTRAVENOUS

## 2017-05-27 MED ORDER — SIMETHICONE 80 MG PO CHEW: 80.0000 mg | CHEWABLE_TABLET | ORAL | Status: DC | PRN

## 2017-05-27 MED ORDER — SOD CITRATE-CITRIC ACID 500-334 MG/5ML PO SOLN
30.0000 mL | Freq: Once | ORAL | Status: AC
  Administered 2017-05-27: 30 mL via ORAL

## 2017-05-27 MED ORDER — BUPIVACAINE IN DEXTROSE 0.75-8.25 % IT SOLN
INTRATHECAL | Status: DC | PRN
  Administered 2017-05-27: 1.5 mL via INTRATHECAL

## 2017-05-27 MED ORDER — SODIUM CHLORIDE 0.9% FLUSH: 3.0000 mL | INTRAVENOUS | Status: DC | PRN

## 2017-05-27 MED ORDER — LACTATED RINGERS IV SOLN
INTRAVENOUS | Status: DC
  Administered 2017-05-27 (×2): via INTRAVENOUS

## 2017-05-27 MED ORDER — COCONUT OIL OIL: 1.0000 "application " | TOPICAL_OIL | Status: DC | PRN

## 2017-05-27 MED ORDER — OXYCODONE HCL 5 MG PO TABS: 5.0000 mg | ORAL_TABLET | Freq: Once | ORAL | Status: DC | PRN

## 2017-05-27 MED ORDER — LEVETIRACETAM 500 MG PO TABS: 1000.0000 mg | ORAL_TABLET | Freq: Two times a day (BID) | ORAL | Status: DC

## 2017-05-27 MED ORDER — MORPHINE SULFATE (PF) 0.5 MG/ML IJ SOLN
INTRAMUSCULAR | Status: AC
  Filled 2017-05-27: qty 10

## 2017-05-27 MED ORDER — SODIUM CHLORIDE 0.9 % IR SOLN
Status: DC | PRN
  Administered 2017-05-27: 1

## 2017-05-27 MED ORDER — DIPHENHYDRAMINE HCL 25 MG PO CAPS: 25.0000 mg | ORAL_CAPSULE | Freq: Four times a day (QID) | ORAL | Status: DC | PRN

## 2017-05-27 MED ORDER — OXYTOCIN 10 UNIT/ML IJ SOLN
INTRAMUSCULAR | Status: AC
  Filled 2017-05-27: qty 4

## 2017-05-27 MED ORDER — PROMETHAZINE HCL 25 MG/ML IJ SOLN: 6.2500 mg | INTRAMUSCULAR | Status: DC | PRN

## 2017-05-27 MED ORDER — ONDANSETRON HCL 4 MG/2ML IJ SOLN
INTRAMUSCULAR | Status: DC | PRN
  Administered 2017-05-27: 4 mg via INTRAVENOUS

## 2017-05-27 MED ORDER — SCOPOLAMINE 1 MG/3DAYS TD PT72: 1.0000 | MEDICATED_PATCH | Freq: Once | TRANSDERMAL | Status: DC

## 2017-05-27 MED ORDER — NALOXONE HCL 2 MG/2ML IJ SOSY
1.0000 ug/kg/h | PREFILLED_SYRINGE | INTRAVENOUS | Status: DC | PRN
  Filled 2017-05-27: qty 2

## 2017-05-27 MED ORDER — OXYCODONE-ACETAMINOPHEN 5-325 MG PO TABS: 2.0000 | ORAL_TABLET | ORAL | Status: DC | PRN

## 2017-05-27 MED ORDER — NALBUPHINE HCL 10 MG/ML IJ SOLN
5.0000 mg | INTRAMUSCULAR | Status: DC | PRN
Start: 2017-05-27 — End: 2017-05-28

## 2017-05-27 MED ORDER — KETOROLAC TROMETHAMINE 30 MG/ML IJ SOLN
30.0000 mg | Freq: Once | INTRAMUSCULAR | Status: DC | PRN
  Administered 2017-05-27: 30 mg via INTRAVENOUS

## 2017-05-27 MED ORDER — ZOLPIDEM TARTRATE 5 MG PO TABS
5.0000 mg | ORAL_TABLET | Freq: Every evening | ORAL | Status: DC | PRN
Start: 2017-05-27 — End: 2017-05-28

## 2017-05-27 MED ORDER — LAMOTRIGINE ER 100 MG PO TB24
450.0000 mg | ORAL_TABLET | Freq: Two times a day (BID) | ORAL | Status: DC
  Administered 2017-05-28 – 2017-05-29 (×3): 450 mg via ORAL
  Filled 2017-05-27 (×3): qty 4.5

## 2017-05-27 MED ORDER — ACETAMINOPHEN 325 MG PO TABS
650.0000 mg | ORAL_TABLET | ORAL | Status: DC | PRN
  Administered 2017-05-28: 650 mg via ORAL
  Filled 2017-05-27: qty 2

## 2017-05-27 MED ORDER — MORPHINE SULFATE (PF) 0.5 MG/ML IJ SOLN
INTRAMUSCULAR | Status: DC | PRN
  Administered 2017-05-27: .2 mg via EPIDURAL

## 2017-05-27 MED ORDER — PHENYLEPHRINE 8 MG IN D5W 100 ML (0.08MG/ML) PREMIX OPTIME
INJECTION | INTRAVENOUS | Status: AC
  Filled 2017-05-27: qty 100

## 2017-05-27 MED ORDER — LEVOTHYROXINE SODIUM 175 MCG PO TABS
175.0000 ug | ORAL_TABLET | Freq: Every day | ORAL | Status: DC
  Administered 2017-05-28 – 2017-05-29 (×2): 175 ug via ORAL
  Filled 2017-05-27 (×2): qty 1

## 2017-05-27 MED ORDER — HYDROMORPHONE HCL 1 MG/ML IJ SOLN
INTRAMUSCULAR | Status: AC
  Filled 2017-05-27: qty 0.5

## 2017-05-27 MED ORDER — FENTANYL CITRATE (PF) 100 MCG/2ML IJ SOLN
INTRAMUSCULAR | Status: AC
  Filled 2017-05-27: qty 2

## 2017-05-27 MED ORDER — LEVETIRACETAM 750 MG PO TABS
1750.0000 mg | ORAL_TABLET | Freq: Two times a day (BID) | ORAL | Status: DC
  Administered 2017-05-27 – 2017-05-29 (×4): 1750 mg via ORAL
  Filled 2017-05-27 (×4): qty 1

## 2017-05-27 MED ORDER — LAMOTRIGINE ER 50 MG PO TB24: 50.0000 mg | ORAL_TABLET | Freq: Two times a day (BID) | ORAL | Status: DC

## 2017-05-27 MED ORDER — LEVETIRACETAM 750 MG PO TABS: 750.0000 mg | ORAL_TABLET | Freq: Two times a day (BID) | ORAL | Status: DC

## 2017-05-27 MED ORDER — LAMOTRIGINE ER 50 MG PO TB24
450.0000 mg | ORAL_TABLET | Freq: Two times a day (BID) | ORAL | Status: DC
  Administered 2017-05-27: 450 mg via ORAL
  Filled 2017-05-27 (×2): qty 9

## 2017-05-27 MED ORDER — BUPIVACAINE IN DEXTROSE 0.75-8.25 % IT SOLN
INTRATHECAL | Status: AC
  Filled 2017-05-27: qty 2

## 2017-05-27 MED ORDER — OXYCODONE HCL 5 MG/5ML PO SOLN: 5.0000 mg | Freq: Once | ORAL | Status: DC | PRN

## 2017-05-27 MED ORDER — SOD CITRATE-CITRIC ACID 500-334 MG/5ML PO SOLN
ORAL | Status: AC
  Filled 2017-05-27: qty 15

## 2017-05-27 MED ORDER — KETOROLAC TROMETHAMINE 30 MG/ML IJ SOLN
INTRAMUSCULAR | Status: AC
Start: 2017-05-27 — End: 2017-05-27
  Filled 2017-05-27: qty 1

## 2017-05-27 MED ORDER — OXYTOCIN 10 UNIT/ML IJ SOLN
INTRAMUSCULAR | Status: DC | PRN
  Administered 2017-05-27: 40 [IU] via INTRAVENOUS

## 2017-05-27 MED ORDER — METHYLERGONOVINE MALEATE 0.2 MG/ML IJ SOLN: 0.2000 mg | INTRAMUSCULAR | Status: DC | PRN

## 2017-05-27 MED ORDER — IBUPROFEN 600 MG PO TABS
600.0000 mg | ORAL_TABLET | Freq: Four times a day (QID) | ORAL | Status: DC
  Administered 2017-05-27 – 2017-05-29 (×7): 600 mg via ORAL
  Filled 2017-05-27 (×6): qty 1

## 2017-05-27 MED ORDER — SCOPOLAMINE 1 MG/3DAYS TD PT72
MEDICATED_PATCH | TRANSDERMAL | Status: AC
Start: 2017-05-27 — End: ?
  Filled 2017-05-27: qty 1

## 2017-05-27 MED ORDER — NALBUPHINE HCL 10 MG/ML IJ SOLN: 5.0000 mg | INTRAMUSCULAR | Status: DC | PRN

## 2017-05-27 MED ORDER — SIMETHICONE 80 MG PO CHEW
80.0000 mg | CHEWABLE_TABLET | ORAL | Status: DC
  Administered 2017-05-27: 80 mg via ORAL
  Filled 2017-05-27 (×2): qty 1

## 2017-05-27 SURGICAL SUPPLY — 37 items
APL SKNCLS STERI-STRIP NONHPOA (GAUZE/BANDAGES/DRESSINGS) ×1
BENZOIN TINCTURE PRP APPL 2/3 (GAUZE/BANDAGES/DRESSINGS) ×1 IMPLANT
CHLORAPREP W/TINT 26ML (MISCELLANEOUS) ×2 IMPLANT
CLAMP CORD UMBIL (MISCELLANEOUS) IMPLANT
CLOTH BEACON ORANGE TIMEOUT ST (SAFETY) ×2 IMPLANT
CONTAINER PREFILL 10% NBF 15ML (MISCELLANEOUS) IMPLANT
DRSG OPSITE POSTOP 4X10 (GAUZE/BANDAGES/DRESSINGS) ×2 IMPLANT
ELECT REM PT RETURN 9FT ADLT (ELECTROSURGICAL) ×2
ELECTRODE REM PT RTRN 9FT ADLT (ELECTROSURGICAL) ×1 IMPLANT
EXTRACTOR VACUUM M CUP 4 TUBE (SUCTIONS) IMPLANT
GLOVE BIO SURGEON STRL SZ7.5 (GLOVE) ×2 IMPLANT
GLOVE BIOGEL PI IND STRL 7.0 (GLOVE) ×1 IMPLANT
GLOVE BIOGEL PI INDICATOR 7.0 (GLOVE) ×1
GOWN STRL REUS W/TWL LRG LVL3 (GOWN DISPOSABLE) ×4 IMPLANT
KIT ABG SYR 3ML LUER SLIP (SYRINGE) IMPLANT
NDL HYPO 25X5/8 SAFETYGLIDE (NEEDLE) IMPLANT
NDL SPNL 20GX3.5 QUINCKE YW (NEEDLE) IMPLANT
NEEDLE HYPO 22GX1.5 SAFETY (NEEDLE) ×2 IMPLANT
NEEDLE HYPO 25X5/8 SAFETYGLIDE (NEEDLE) IMPLANT
NEEDLE SPNL 20GX3.5 QUINCKE YW (NEEDLE) IMPLANT
NS IRRIG 1000ML POUR BTL (IV SOLUTION) ×2 IMPLANT
PACK C SECTION WH (CUSTOM PROCEDURE TRAY) ×2 IMPLANT
PENCIL SMOKE EVAC W/HOLSTER (ELECTROSURGICAL) ×2 IMPLANT
STRIP CLOSURE SKIN 1/2X4 (GAUZE/BANDAGES/DRESSINGS) ×1 IMPLANT
SUT MNCRL 0 VIOLET CTX 36 (SUTURE) ×2 IMPLANT
SUT MNCRL AB 3-0 PS2 27 (SUTURE) IMPLANT
SUT MON AB 2-0 CT1 27 (SUTURE) ×2 IMPLANT
SUT MON AB-0 CT1 36 (SUTURE) ×4 IMPLANT
SUT MONOCRYL 0 CTX 36 (SUTURE) ×2
SUT PLAIN 0 NONE (SUTURE) IMPLANT
SUT PLAIN 2 0 (SUTURE)
SUT PLAIN 2 0 XLH (SUTURE) IMPLANT
SUT PLAIN ABS 2-0 CT1 27XMFL (SUTURE) IMPLANT
SYR 20CC LL (SYRINGE) IMPLANT
SYR CONTROL 10ML LL (SYRINGE) ×2 IMPLANT
TOWEL OR 17X24 6PK STRL BLUE (TOWEL DISPOSABLE) ×2 IMPLANT
TRAY FOLEY BAG SILVER LF 14FR (SET/KITS/TRAYS/PACK) ×2 IMPLANT

## 2017-05-27 NOTE — Progress Notes (Signed)
Patient ID: Kelly Mcclure, female   DOB: 08/21/1980, 36 y.o.   MRN: [id number] Patient seen and examined. Consent witnessed and signed. No changes noted. Update completed.

## 2017-05-27 NOTE — Anesthesia Procedure Notes (Signed)
Spinal  Patient location during procedure: OR Staffing Anesthesiologist: Nolon Nations Performed: anesthesiologist  Preanesthetic Checklist Completed: patient identified, site marked, surgical consent, pre-op evaluation, timeout performed, IV checked, risks and benefits discussed and monitors and equipment checked Spinal Block Patient position: sitting Prep: site prepped and draped and DuraPrep Patient monitoring: heart rate, continuous pulse ox and blood pressure Approach: midline Location: L3-4 Injection technique: single-shot Needle Needle type: Sprotte  Needle gauge: 24 G Needle length: 9 cm Assessment Sensory level: T6 Additional Notes Expiration date of kit checked and confirmed. Patient tolerated procedure well, without complications.

## 2017-05-27 NOTE — Anesthesia Preprocedure Evaluation (Signed)
Anesthesia Evaluation  Patient identified by MRN, date of birth, ID band Patient awake    Reviewed: Allergy & Precautions, NPO status , Patient's Chart, lab work & pertinent test results  History of Anesthesia Complications Negative for: history of anesthetic complications  Airway Mallampati: II  TM Distance: >3 FB Neck ROM: Full    Dental no notable dental hx.    Pulmonary neg pulmonary ROS,    Pulmonary exam normal breath sounds clear to auscultation       Cardiovascular negative cardio ROS Normal cardiovascular exam Rhythm:Regular Rate:Normal     Neuro/Psych  Headaches, Seizures - (daily absense seizures), Poorly Controlled,  negative psych ROS   GI/Hepatic Neg liver ROS, GERD  ,  Endo/Other  Hypothyroidism Hyperthyroidism   Renal/GU negative Renal ROS     Musculoskeletal negative musculoskeletal ROS (+)   Abdominal   Peds  Hematology negative hematology ROS (+)   Anesthesia Other Findings   Reproductive/Obstetrics (+) Pregnancy                             Anesthesia Physical  Anesthesia Plan  ASA: II  Anesthesia Plan: Spinal   Post-op Pain Management:    Induction: Intravenous  PONV Risk Score and Plan: 4 or greater and Ondansetron and Scopolamine patch - Pre-op  Airway Management Planned:   Additional Equipment:   Intra-op Plan:   Post-operative Plan:   Informed Consent: I have reviewed the patients History and Physical, chart, labs and discussed the procedure including the risks, benefits and alternatives for the proposed anesthesia with the patient or authorized representative who has indicated his/her understanding and acceptance.   Dental advisory given  Plan Discussed with: CRNA  Anesthesia Plan Comments:         Anesthesia Quick Evaluation

## 2017-05-27 NOTE — Op Note (Signed)
Cesarean Section Procedure Note  Indications: Abdominal cerclage with cervical change   Pre-operative Diagnosis: 38 week IUP  Abdominal cerclage with cervical change   Post-operative Diagnosis: same  Surgeon: Lovenia Kim   Assistants: Janann Colonel, CNM  Anesthesia: Local anesthesia 0.25.% bupivacaine and Spinal anesthesia  ASA Class: 2  Procedure Details  The patient was seen in the Holding Room. The risks, benefits, complications, treatment options, and expected outcomes were discussed with the patient.  The patient concurred with the proposed plan, giving informed consent. The risks of anesthesia, infection, bleeding and possible injury to other organs discussed. Injury to bowel, bladder, or ureter with possible need for repair discussed. Possible need for transfusion with secondary risks of hepatitis or HIV acquisition discussed. Post operative complications to include but not limited to DVT, PE and Pneumonia noted. The site of surgery properly noted/marked. The patient was taken to Operating Room # 9, identified as Kelly Mcclure and the procedure verified as C-Section Delivery. A Time Out was held and the above information confirmed.  After induction of anesthesia, the patient was draped and prepped in the usual sterile manner. A Pfannenstiel incision was made and carried down through the subcutaneous tissue to the fascia. Fascial incision was made and extended transversely using Mayo scissors. The fascia was separated from the underlying rectus tissue superiorly and inferiorly. The peritoneum was identified and entered. Peritoneal incision was extended longitudinally. The utero-vesical peritoneal reflection was incised transversely and the bladder flap was bluntly freed from the lower uterine segment. A low transverse uterine incision(Kerr hysterotomy) was made. Delivered from OA presentation was a  female with Apgar scores of 8 at one minute and 9 at five minutes. Bulb suctioning  gently performed. Neonatal team in attendance.After the umbilical cord was clamped and cut cord blood was obtained for evaluation. The placenta was removed intact and appeared normal. The uterus was curetted with a dry lap pack. Good hemostasis was noted.The uterine outline, tubes and ovaries appeared as noted below. The uterine incision was closed with running locked sutures of 0 Monocryl x 2 layers. Hemostasis was observed. Lavage was carried out until clear.The parietal peritoneum was closed with a running 2-0 Monocryl suture. The fascia was then reapproximated with running sutures of 0 Monocryl. The skin was reapproximated with 3-0 monocryl after [place] closure with 2-0 plain.  Instrument, sponge, and needle counts were correct prior the abdominal closure and at the conclusion of the case.   Findings: FTLF, anterior placenta, nl right adnexa. Absent left adnexa  Estimated Blood Loss:  500         Drains: foley                 Specimens: placenta                 Complications:  None; patient tolerated the procedure well.         Disposition: PACU - hemodynamically stable.         Condition: stable  Attending Attestation: I performed the procedure.

## 2017-05-27 NOTE — Progress Notes (Signed)
Primary C/S for abdominal and cervical cerclages.

## 2017-05-27 NOTE — Transfer of Care (Signed)
Immediate Anesthesia Transfer of Care Note  Patient: Kelly Mcclure  Procedure(s) Performed: Primary CESAREAN SECTION (N/A )  Patient Location: PACU  Anesthesia Type:Spinal  Level of Consciousness: awake, alert  and oriented  Airway & Oxygen Therapy: Patient Spontanous Breathing  Post-op Assessment: Report given to RN and Post -op Vital signs reviewed and stable  Post vital signs: Reviewed and stable  Last Vitals:  Vitals:   05/27/17 0747  BP: 139/79  Pulse: 95  Resp: 15  Temp: 37.1 C    Last Pain:  Vitals:   05/27/17 0747  TempSrc: Oral         Complications: No apparent anesthesia complications

## 2017-05-27 NOTE — Progress Notes (Signed)
Pt brought home medications with her, explained per pharmacy that it is hospital policy for [place] to give medications if we have them available, pt verbalized understanding and requested her keppra and lamotrigine be rescheduled for 0800/2000.

## 2017-05-27 NOTE — Anesthesia Postprocedure Evaluation (Signed)
Anesthesia Post Note  Patient: Kelly Mcclure  Procedure(s) Performed: Primary CESAREAN SECTION (N/A )     Patient location during evaluation: PACU Anesthesia Type: Spinal Level of consciousness: awake and alert Pain management: pain level controlled Vital Signs Assessment: post-procedure vital signs reviewed and stable Respiratory status: spontaneous breathing and respiratory function stable Cardiovascular status: blood pressure returned to baseline and stable Postop Assessment: spinal receding Anesthetic complications: no    Last Vitals:  Vitals:   05/27/17 1401 05/27/17 1500  BP: 113/71 111/67  Pulse: 70 87  Resp: 18 18  Temp: 36.8 C 36.9 C  SpO2: 94% 94%    Last Pain:  Vitals:   05/27/17 1542  TempSrc:   PainSc: 0-No pain   Pain Goal:                 Nolon Nations

## 2017-05-28 LAB — CBC
HCT: 32.7 % — ABNORMAL LOW (ref 36.0–46.0)
Hemoglobin: 10.8 g/dL — ABNORMAL LOW (ref 12.0–15.0)
MCH: 27.6 pg (ref 26.0–34.0)
MCHC: 33 g/dL (ref 30.0–36.0)
MCV: 83.6 fL (ref 78.0–100.0)
PLATELETS: 165 10*3/uL (ref 150–400)
RBC: 3.91 MIL/uL (ref 3.87–5.11)
RDW: 15.6 % — AB (ref 11.5–15.5)
WBC: 9.2 10*3/uL (ref 4.0–10.5)

## 2017-05-28 MED ORDER — FLUOXETINE HCL 10 MG PO CAPS
10.0000 mg | ORAL_CAPSULE | Freq: Every day | ORAL | Status: DC
  Administered 2017-05-28: 10 mg via ORAL
  Filled 2017-05-28: qty 1

## 2017-05-28 MED ORDER — MEASLES, MUMPS & RUBELLA VAC ~~LOC~~ INJ
0.5000 mL | INJECTION | Freq: Once | SUBCUTANEOUS | Status: AC
  Administered 2017-05-29: 0.5 mL via SUBCUTANEOUS
  Filled 2017-05-28 (×2): qty 0.5

## 2017-05-28 NOTE — Progress Notes (Signed)
CSW received consult due to score greater than 9, or positive for SI on Edinburgh Depression Screen (MOB score was 17).    When CSW arrived, MOB was sitting on the bed, FOB was on his phone, and infant was asleep in bassinet. CSW introduced herself and explained CSW's role.  MOB was receptive to meeting with CSW and gave CSW permission to meet with MOB while FOB was present. MOB presented with insight and awareness about PPD and communicated that MOB works for [employer]. MOB expressed a wealth of supports (FOB, MOB's family, and MOB's work family), and feeling prepared to parent.   CSW provided education regarding Baby Blues vs PMADs and provided MOB with information about support groups held at [facility] encouraged MOB to evaluate her mental health throughout the postpartum period with the use of the New Mom Checklist developed by Postpartum Progress and notify a medical professional if symptoms arise. CSW assessed for safety and MOB denied SI and HI.   CSW identifies no further need for intervention and no barriers to discharge at this time.  Laurey Arrow, MSW, LCSW Clinical Social Work [phone]

## 2017-05-28 NOTE — Anesthesia Postprocedure Evaluation (Signed)
Anesthesia Post Note  Patient: Kelly Mcclure  Procedure(s) Performed: Primary CESAREAN SECTION (N/A )     Patient location during evaluation: Mother Baby Anesthesia Type: Spinal Level of consciousness: oriented and awake and alert Pain management: pain level controlled Vital Signs Assessment: post-procedure vital signs reviewed and stable Respiratory status: spontaneous breathing and respiratory function stable Cardiovascular status: blood pressure returned to baseline and stable Postop Assessment: no headache, no backache and no apparent nausea or vomiting Anesthetic complications: no    Last Vitals:  Vitals:   05/28/17 0550 05/28/17 0846  BP: (!) 107/57 110/68  Pulse: 82 86  Resp: 18 20  Temp: 36.9 C 36.8 C  SpO2: 93% 96%    Last Pain:  Vitals:   05/28/17 0846  TempSrc: Oral  PainSc:    Pain Goal:                 Riki Sheer

## 2017-05-28 NOTE — Progress Notes (Signed)
CSW received consult due to score greater than 9, or positive for SI on Edinburgh Depression Screen (MOB score was 17).    When CSW arrived, MOB was sitting on the bed, FOB was on his phone, and infant was asleep in bassinet. CSW introduced herself and explained CSW's role.  MOB was receptive to meeting with CSW and gave CSW permission to meet with MOB while FOB was present. MOB presented with insight and awareness about PPD and communicated that MOB works for [employer]. MOB expressed a wealth of supports (FOB, MOB's family, and MOB's work family), and feeling prepared to parent.   CSW provided education regarding Baby Blues vs PMADs and provided MOB with information about support groups held at [facility] encouraged MOB to evaluate her mental health throughout the postpartum period with the use of the New Mom Checklist developed by Postpartum Progress and notify a medical professional if symptoms arise. CSW assessed for safety and MOB denied SI and HI.   CSW identifies no further need for intervention and no barriers to discharge at this time.  Laurey Arrow, MSW, LCSW Clinical Social Work [phone]

## 2017-05-28 NOTE — Addendum Note (Signed)
Addendum  created 05/28/17 7195 by Riki Sheer, CRNA   Sign clinical note

## 2017-05-28 NOTE — Progress Notes (Signed)
POSTOPERATIVE DAY # 1 S/P CS - abdominal cerclage   S:         Reports feeling ok - little sore but no pain                    reported significant anxiety to Dr Ronita Hipps - limited support at home / increased EPDS elevated antepartum             Tolerating po intake / no nausea / no vomiting / no flatus / no BM             Bleeding is light             Pain controlled with long-acting pain analgesia and motrin             Up ad lib / ambulatory/ voiding QS  Newborn formula feeding  / female  O:  VS: BP (!) 107/57 (BP Location: Right Arm)   Pulse 82   Temp 98.4 F (36.9 C) (Oral)   Resp 18   Ht '5\' 5"'  (1.651 m)   Wt 82.1 kg (181 lb)   LMP 08/31/2016 (Exact Date)   SpO2 93%   Breastfeeding? Unknown   BMI 30.12 kg/m    LABS:               Recent Labs  05/26/17 0940 05/28/17 0533  WBC 9.8 9.2  HGB 12.6 10.8*  PLT 175 165               Bloodtype: --/--/A POS (10/23 0936)  Rubella: Nonimmune (04/11 0000)  - offer MMR             tdaP and flu current 2018                                     I&O: Intake/Output      10/24 0701 - 10/25 0700 10/25 0701 - 10/26 0700   P.O. 240    I.V. (mL/kg) 2300 (28)    Total Intake(mL/kg) 2540 (30.9)    Urine (mL/kg/hr) 2000    Blood 712    Total Output 2712     Net -172                     Physical Exam:             Alert and Oriented X3  Lungs: Clear and unlabored  Heart: regular rate and rhythm / no mumurs  Abdomen: soft, non-tender, non-distended, active BS             Fundus: firm, non-tender, Ueven             Dressing intact              Incision:  approximated with suture / no erythema / no ecchymosis / no drainage  Perineum: intact  Lochia: light  Extremities: no edema, no calf pain or tenderness, SCD in place  A:        POD # 1 S/P CS            Seizure disorder - formula feeding only            Anxiety - significant risk for PPD / anxiety - recommended start of medication per Dr Ronita Hipps            Rubella Non-immune  P:  Routine postoperative care              Reviewed care for engorgement - formula feeding only             Start Prozac 10 mg at [facility]             Offer MMR    Artelia Laroche CNM, MSN, [facility] 05/28/2017, 8:35 AM

## 2017-05-28 NOTE — Progress Notes (Signed)
Patient takes LamoTRIgine 450 mg PO extended release every 12 hours at home. LamoTRIgine 450 mg PO has been given to patient while in hospital but not extended release. Patient was supposed to be taking the extended release as ordered. Pharmacy notifed RN of the error and the extended release will be given to the patient at the next scheduled time. RN notified patient and midwife Flint Melter) of the situation.

## 2017-05-29 MED ORDER — OXYCODONE-ACETAMINOPHEN 5-325 MG PO TABS: 1.0000 | ORAL_TABLET | ORAL | 0 refills | Status: DC | PRN

## 2017-05-29 MED ORDER — FLUOXETINE HCL 20 MG PO CAPS: 20.0000 mg | ORAL_CAPSULE | Freq: Every day | ORAL | 5 refills | Status: DC

## 2017-05-29 MED ORDER — SENNOSIDES-DOCUSATE SODIUM 8.6-50 MG PO TABS: 2.0000 | ORAL_TABLET | Freq: Every day | ORAL | 1 refills | Status: DC

## 2017-05-29 MED ORDER — FERROUS SULFATE 325 (65 FE) MG PO TABS: 325.0000 mg | ORAL_TABLET | Freq: Every day | ORAL | 3 refills | Status: DC

## 2017-05-29 NOTE — Discharge Summary (Signed)
OB Discharge Summary  Patient Name: Kelly Mcclure DOB: 11-13-1980 MRN: [id number]  Date of admission: 05/27/2017 Delivering MD: Brien Few   Date of discharge: 05/29/2017  Admitting diagnosis: CTX Abdominal Cerclage Intrauterine pregnancy: [redacted]w[redacted]d     Secondary diagnosis:Principal Problem:   Postpartum care following cesarean delivery (10/24) Active Problems:   History of cerclage, currently pregnant  Additional problems:seizure d/o     Discharge diagnosis: Term Pregnancy Delivered                                                                     Post partum procedures:none  Augmentation: PCS  Complications: None  Hospital course:  Sceduled C/S   36 y.o. yo G3P1020 at [redacted]w[redacted]d was admitted to the hospital 05/27/2017 for scheduled cesarean section with the following indication:abdominal cerclage.  Membrane Rupture Time/Date: 9:52 AM ,05/27/2017   Patient delivered a Viable infant.05/27/2017  Details of operation can be found in separate operative note.  [name] had an uncomplicated postpartum course.  She is ambulating, tolerating a regular diet, passing flatus, and urinating well. Patient is discharged home in stable condition on  05/29/17         Physical exam  Vitals:   05/28/17 0550 05/28/17 0846 05/28/17 1807 05/29/17 0523  BP: (!) 107/57 110/68 126/82 (!) 101/57  Pulse: 82 86 90 72  Resp: 18 20 19    Temp: 98.4 F (36.9 C) 98.3 F (36.8 C) 97.6 F (36.4 C)   TempSrc: Oral Oral Oral   SpO2: 93% 96%    Weight:      Height:       General: alert Lochia: appropriate Uterine Fundus: firm Incision: Healing well with no significant drainage DVT Evaluation: No evidence of DVT seen on physical exam. Labs: Lab Results  Component Value Date   WBC 9.2 05/28/2017   HGB 10.8 (L) 05/28/2017   HCT 32.7 (L) 05/28/2017   MCV 83.6 05/28/2017   PLT 165 05/28/2017   CMP Latest Ref Rng & Units 03/16/2017  Glucose 65 - 99 mg/dL 118(H)  BUN 6 - 20 mg/dL <5(L)   Creatinine 0.44 - 1.00 mg/dL 0.37(L)  Sodium 135 - 145 mmol/L 135  Potassium 3.5 - 5.1 mmol/L 4.2  Chloride 101 - 111 mmol/L 107  CO2 22 - 32 mmol/L 22  Calcium 8.9 - 10.3 mg/dL 7.1(L)  Total Protein 6.5 - 8.1 g/dL 5.5(L)  Total Bilirubin 0.3 - 1.2 mg/dL 0.6  Alkaline Phos 38 - 126 U/L 64  AST 15 - 41 U/L 15  ALT 14 - 54 U/L 13(L)    Discharge instruction: per After Visit Summary and "Baby and Me Booklet".  After Visit Meds:  Allergies as of 05/29/2017      Reactions   Sulfa Antibiotics Other (See Comments)   Tongue swelling, throat irritated   Adhesive [tape] Other (See Comments)   Some bandaids cause skin irritation   Other Swelling, Other (See Comments)   Magic mouth wash/ causes swelling of the tongue   Relpax [eletriptan Hydrobromide] Other (See Comments)   Stroke-like symptoms   Ultram [tramadol] Other (See Comments)   seizures      Medication List    TAKE these medications   acetaminophen 500 MG tablet Commonly  known as:  TYLENOL Take 1,000 mg by mouth every 6 (six) hours as needed for moderate pain or headache.   ferrous sulfate 325 (65 FE) MG tablet Commonly known as:  FERROUSUL Take 1 tablet (325 mg total) by mouth daily with breakfast.   FLUoxetine 20 MG capsule Commonly known as:  PROZAC Take 1 capsule (20 mg total) by mouth at bedtime.   folic acid 1 MG tablet Commonly known as:  FOLVITE Take 4 mg by mouth daily.   LAMICTAL XR 200 MG Tb24 24 hour tablet Generic drug:  LamoTRIgine XR Take 400 mg by mouth 2 (two) times daily. Takes with 50mg  XR to equal 450mg , takes twice daily   LAMICTAL XR 50 MG Tb24 24 hour tablet Generic drug:  LamoTRIgine Take 50 mg by mouth 2 (two) times daily. Takes with 400mg  XR to equal 450mg , takes twice daily   levETIRAcetam 1000 MG tablet Commonly known as:  KEPPRA Take 1,000 mg by mouth 2 (two) times daily. Take with 750mg  = 1750mg  each dose   levETIRAcetam 750 MG tablet Commonly known as:  KEPPRA Take 750 mg by  mouth 2 (two) times daily. Take with 1000mg  = 1750mg  each dose   loratadine 10 MG tablet Commonly known as:  CLARITIN Take 1 tablet (10 mg total) by mouth daily.   multivitamin-prenatal 27-0.8 MG Tabs tablet Take 1 tablet by mouth daily at 12 noon.   oxyCODONE-acetaminophen 5-325 MG tablet Commonly known as:  PERCOCET/ROXICET Take 1-2 tablets by mouth every 4 (four) hours as needed (pain scale > 7).   senna-docusate 8.6-50 MG tablet Commonly known as:  Senokot-S Take 2 tablets by mouth at bedtime.   SYNTHROID 175 MCG tablet Generic drug:  levothyroxine TAKE 1 TABLET (175 MCG TOTAL) BY MOUTH DAILY BEFORE BREAKFAST.       Diet: routine diet  Activity: Advance as tolerated. Pelvic rest for 6 weeks.   Outpatient follow up:6 weeks Follow up Appt:Future Appointments Date Time Provider [place]  06/23/2017 7:30 AM Marcial Pacas, MD [facility] None  09/21/2017 11:00 AM Philemon Kingdom, MD [facility] None   Follow up visit: No Follow-up on file.  Postpartum contraception: Not Discussed  Newborn Data: Live born female  Birth Weight: 5 lb 10.1 oz (2555 g) APGAR: 24, 9  Newborn Delivery   Birth date/time:  05/27/2017 09:53:00 Delivery type:  C-Section, Low Transverse  C-section categorization:  Primary     Baby Feeding: Bottle Disposition:home with mother   05/29/2017 Ala Dach., MD

## 2017-05-29 NOTE — Progress Notes (Signed)
POD# 2  S: Pt notes pain controlled w/ po meds, still lower abdominal discomfort, more with moving. Took Percocet last night with relief. minimal lochia, nl void, out of bed w/o dizziness or chest pain, tol reg po, + flatus. Pt is not breastfeeding due to seizure medications. Pt does note anxiety- h/o pregnancy complications, risks for PPD d/w pt.   Vitals:   05/28/17 0550 05/28/17 0846 05/28/17 1807 05/29/17 0523  BP: (!) 107/57 110/68 126/82 (!) 101/57  Pulse: 82 86 90 72  Resp: '18 20 19   ' Temp: 98.4 F (36.9 C) 98.3 F (36.8 C) 97.6 F (36.4 C)   TempSrc: Oral Oral Oral   SpO2: 93% 96%    Weight:      Height:        Gen: well appearing CV: RRR Pulm: CTAB Abd: soft, ND, approp tender, fundus below umbilicus, NT Inc: C/D/I,  LE: tr edema, NT  CBC    Component Value Date/Time   WBC 9.2 05/28/2017 0533   RBC 3.91 05/28/2017 0533   HGB 10.8 (L) 05/28/2017 0533   HCT 32.7 (L) 05/28/2017 0533   PLT 165 05/28/2017 0533   MCV 83.6 05/28/2017 0533   MCH 27.6 05/28/2017 0533   MCHC 33.0 05/28/2017 0533   RDW 15.6 (H) 05/28/2017 0533   LYMPHSABS 2.7 03/14/2017 2226   MONOABS 0.7 03/14/2017 2226   EOSABS 0.1 03/14/2017 2226   BASOSABS 0.0 03/14/2017 2226    A/P: POD#  2 s/p PCS for abdominal cerclage - post-op. Doing well.  - ensure MMR received OK for early d/c  Iron for anemia Cont anti-sz meds, f/u with neuro over labs/ levels/ doses Anxiety, start prozac, pt in agreement  Dominiq Fontaine A. 05/29/2017 8:02 AM

## 2017-06-04 ENCOUNTER — Encounter: Payer: Self-pay | Admitting: Neurology

## 2017-06-05 ENCOUNTER — Other Ambulatory Visit (INDEPENDENT_AMBULATORY_CARE_PROVIDER_SITE_OTHER): Payer: Self-pay

## 2017-06-05 DIAGNOSIS — G40909 Epilepsy, unspecified, not intractable, without status epilepticus: Secondary | ICD-10-CM

## 2017-06-05 DIAGNOSIS — Z0289 Encounter for other administrative examinations: Secondary | ICD-10-CM

## 2017-06-08 ENCOUNTER — Other Ambulatory Visit: Payer: Self-pay | Admitting: *Deleted

## 2017-06-08 ENCOUNTER — Telehealth: Payer: Self-pay | Admitting: Neurology

## 2017-06-08 DIAGNOSIS — G40909 Epilepsy, unspecified, not intractable, without status epilepticus: Secondary | ICD-10-CM

## 2017-06-08 LAB — LAMOTRIGINE LEVEL: Lamotrigine Lvl: 10.4 ug/mL (ref 2.0–20.0)

## 2017-06-08 LAB — LEVETIRACETAM LEVEL: Levetiracetam Lvl: 19.2 ug/mL (ref 10.0–40.0)

## 2017-06-08 NOTE — Telephone Encounter (Addendum)
Patient had elective C-section on May 27 2017,  Before delivery, May 25 2017, lamotrigine level 5.9, Keppra 14,  Postpartum November 1, lamotrigine 10.4, Keppra level 19.2,  Please call patient, check to make sure she does not have clinical seizure, She may decrease her lamotrigine xr 200 mg to 2 tablets twice a day, stop 50 mg tablets  Decrease Keppra to 750 mg tablet 2 tablets twice a day

## 2017-06-08 NOTE — Telephone Encounter (Signed)
Spoke to patient - she has not had any seizure activity or signs/symptoms of toxicity.  She is agreeable to the medication changes below.  She has a pending appt on 06/23/17.  Dr. Krista Blue would like for her to come in for repeat labs on 06/22/17.  Pt aware and orders have been placed in Epic.

## 2017-06-22 ENCOUNTER — Other Ambulatory Visit (INDEPENDENT_AMBULATORY_CARE_PROVIDER_SITE_OTHER): Payer: Self-pay

## 2017-06-22 DIAGNOSIS — G40909 Epilepsy, unspecified, not intractable, without status epilepticus: Secondary | ICD-10-CM

## 2017-06-22 DIAGNOSIS — Z0289 Encounter for other administrative examinations: Secondary | ICD-10-CM

## 2017-06-22 DIAGNOSIS — E890 Postprocedural hypothyroidism: Secondary | ICD-10-CM

## 2017-06-23 ENCOUNTER — Encounter: Payer: Self-pay | Admitting: Neurology

## 2017-06-23 ENCOUNTER — Ambulatory Visit (INDEPENDENT_AMBULATORY_CARE_PROVIDER_SITE_OTHER): Payer: 59 | Admitting: Neurology

## 2017-06-23 VITALS — BP 125/73 | HR 92 | Ht 65.0 in | Wt 155.5 lb

## 2017-06-23 DIAGNOSIS — G40909 Epilepsy, unspecified, not intractable, without status epilepticus: Secondary | ICD-10-CM | POA: Diagnosis not present

## 2017-06-23 NOTE — Progress Notes (Signed)
PATIENT: Kelly Mcclure DOB: [birth year]  Chief Complaint  Patient presents with  . Seizures    She has not had any further seizures since 03/14/17.  She had a c-section 05/27/17 and has a healthy daughter at home Kelly Mcclure).     HISTORICAL  Kelly Mcclure is a 35 years old right-handed female, accompanied by her husband Kelly Mcclure, seen in refer by her obstetrician Kelly Mcclure,for evaluation of seizure, initial evaluation was on August 16 Mcclure  She had past medical history of cervical cancer, GERD, radioactive iodine thyroid ablation, hypothyroidism, chronic migraine, history of kidney stone, epilepsy, she is currently [redacted] weeks pregnant  She was diagnosed with epilepsy since 2002, has been under the care of Kelly Mcclure neurologist Kelly Mcclure.  I reviewed the most recent evaluation by Kelly Mcclure,   In June of 2002, she presented her first generalized tonic-clonic seizure while having the vacation at the beach, this happened in the setting of sleep deprivation, mild alcohol use  MRI of the brain, MRA of the brain showed no significant abnormality.  EEG at that time demonstrate generalized epileptiform discharge per description. She was started on Depakote 250 mg 3 times a day, she reported significant weight gain within short period of time, then she was switched to Topamax for extended period of time untill 2011, she was diagnosed with kidney stone.  She was switched to lamotrigine since 2011, at later multiple follow-up visits, there was description of spacing out spells, no myoclonic jerking, Keppra was added on later, which has helped her spells.but she still has occasionally recurrent spells  She  has been doing well taking Keppra 500 mg 3 tablets twice a day, lamotrigine 150 mg 3 tablets twice a day,she is currently [redacted] weeks pregnant, but still has occasionally space out spells, eyes rolled back, transient lapse of time, lasting few seconds.  On  August 11 Mcclure, when she was 6 months pregnant, her husband heard a loud screaming noise, saw patient body tense up, foaming coming out of her mouth,lasting for few minutes, was taken to the emergency room by ambulance  Laboratory evaluations UA showed large amount of leukocytes, protein was elevated 100,UDS was negative, CBC was within normal limits, hemoglobin was 12.3,CMP showed low potassium 3.0,creatinine 0.56  Lamictal level was 2.3, 24 hours urine protein was less than 6, within normal limit  I personally reviewed MRI of the brain without contrast on August 13 Mcclure,that was normal  She was given IV magnesium infusion, now she is on higher dose of lamotrigine XR 400 plus 50 mg twice a day, Keppra 1000 plus 750 mg twice a day, she tolerated the medication well,  UPDATE Nov 20 Mcclure: Patient had elective C-section on October 24 Mcclure,  Before delivery, October 22 Mcclure, lamotrigine level 5.9, Keppra 14,  Postpartum November 1, lamotrigine 10.4, Keppra level 19.2,  On Nov 5 Mcclure She decreased her lamotrigine xr 200 mg to 2 tablets twice a day, stop 50 mg tablets  Decrease Keppra to 750 mg tablet 2 tablets twice a day  She is doing well there was no recurrent seizure she is not breast-feeding  REVIEW OF SYSTEMS: Full 14 system review of systems performed and notable only for seizure, depression  ALLERGIES: Allergies  Allergen Reactions  . Sulfa Antibiotics Other (See Comments)    Tongue swelling, throat irritated  . Adhesive [Tape] Other (See Comments)    "skin burn"  . Other Swelling and Other (See Comments)  Magic mouth wash/ causes swelling of the tongue  . Relpax [Eletriptan Hydrobromide] Other (See Comments)    Stroke-like symptoms  . Ultram [Tramadol] Other (See Comments)    seizures    HOME MEDICATIONS: Current Outpatient Medications  Medication Sig Dispense Refill  . acetaminophen (TYLENOL) 500 MG tablet Take 1,000 mg by mouth every 6 (six) hours as needed for  moderate pain or headache.     . ferrous sulfate (FERROUSUL) 325 (65 FE) MG tablet Take 1 tablet (325 mg total) by mouth daily with breakfast. 30 tablet 3  . FLUoxetine (PROZAC) 20 MG capsule Take 1 capsule (20 mg total) by mouth at bedtime. 30 capsule 5  . folic acid (FOLVITE) 1 MG tablet Take 4 mg by mouth daily.  3  . LamoTRIgine XR (LAMICTAL XR) 200 MG TB24 Take 400 mg 2 (two) times daily by mouth.    . levETIRAcetam (KEPPRA) 750 MG tablet Take 2 tablets 2 (two) times daily by mouth.    . oxyCODONE-acetaminophen (PERCOCET/ROXICET) 5-325 MG tablet Take 1-2 tablets by mouth every 4 (four) hours as needed (pain scale > 7). 30 tablet 0  . Prenatal Vit-Fe Fumarate-FA (MULTIVITAMIN-PRENATAL) 27-0.8 MG TABS tablet Take 1 tablet by mouth daily at 12 noon.    . senna-docusate (SENOKOT-S) 8.6-50 MG tablet Take 2 tablets by mouth at bedtime. 30 tablet 1  . SYNTHROID 175 MCG tablet TAKE 1 TABLET (175 MCG TOTAL) BY MOUTH DAILY BEFORE BREAKFAST. 30 tablet 2  . loratadine (CLARITIN) 10 MG tablet Take 1 tablet (10 mg total) by mouth daily. 90 tablet 1   No current facility-administered medications for this visit.     PAST MEDICAL HISTORY: Past Medical History:  Diagnosis Date  . Abnormal liver enzymes   . Cancer (HCC)    hx of cervical cancer  . Complication of anesthesia    after some surgeries - pt woke up with migraine  . GERD (gastroesophageal reflux disease)    with pregnancy  . H/O radioactive iodine thyroid ablation 12/2009  . 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 History of kidney stones 2011  . Hx of varicella   . Hypothyroidism   . Migraines   . Seizure disorder ([place])    Diagnosed at age 38  . Seizures ([place])    clonic tonic seizures - will "space out " for a very brief moment.  take medication for seizures. HX OF MAJOR SEIZURES - LOOSING CONSCIOUSNESS-- APPROX 10 TO 12 YRS AGO- NEUROLOGIST IS Kelly Mcclure IN [place], absent seizures  . 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 Vaginal Pap smear, abnormal     PAST SURGICAL HISTORY: Past Surgical History:  Procedure Laterality Date  . ADENOIDECTOMY  1995  . CERCLAGE LAPAROSCOPIC ABDOMINAL N/A 4/18/Mcclure   Performed by Governor Specking, MD at [facility]S  . CERVICAL DILATATION AND ENDOCERVICAL CURRETAGE AND VAAGINAL BIOPIES N/A 03/01/2013   Performed by Alvino Chapel, MD at [facility] ORS  . cold knife conization    . DILATION AND CURETTAGE OF UTERUS  2009  . LAPAROSCOPIC TRANSABDOMINAL CERVICAL-ISTHMUSx2 N/A 4/18/Mcclure   Performed by Governor Specking, MD at [facility]  . LEEP  02/25/2010  . LEEP  07/25/11  . MOUTH SURGERY    . NASAL SEPTUM SURGERY  2006   Repair of deviated septum  . Primary CESAREAN SECTION N/A 10/24/Mcclure   Performed by Brien Few, MD at [place]  . SIGMOIDOSCOPY    .  TONSILLECTOMY  1995    FAMILY HISTORY: Family History  Problem Relation Age of Onset  . Leukemia Other   . Diabetes Other   . Diabetes Mother   . Skin cancer Mother   . Hyperlipidemia Mother   . Hypertension Father   . Asthma Father   . Skin cancer Maternal Aunt   . Multiple myeloma Maternal Aunt   . Testicular cancer Maternal Uncle   . Hyperlipidemia Maternal Grandmother   . AAA (abdominal aortic aneurysm) Maternal Grandmother   . Prostate cancer Maternal Grandfather   . AAA (abdominal aortic aneurysm) Maternal Grandfather     SOCIAL HISTORY:  Social History   Social History  . Marital status: Single    Spouse name: N/A  . Number of children: N/A  . Years of education: N/A   Occupational History  . 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   Social History Main Topics  . Smoking status: Never Smoker  . Smokeless tobacco: Never Used  . Alcohol use No     Comment: Rare  . Drug use: No  . Sexual activity: Yes   Other Topics Concern  . Not on file   Social History Narrative  . No narrative on file     PHYSICAL EXAM   Vitals:   06/23/17 0714  BP: 125/73   Pulse: 92  Weight: 155 lb 8 oz (70.5 kg)  Height: '5\' 5"'  (1.651 m)    Not recorded      Body mass index is 25.88 kg/m.  PHYSICAL EXAMNIATION:  Gen: NAD, conversant, well nourised, obese, well groomed                     Cardiovascular: Regular rate rhythm, no peripheral edema, warm, nontender. Eyes: Conjunctivae clear without exudates or hemorrhage Neck: Supple, no carotid bruits. Pulmonary: Clear to auscultation bilaterally   NEUROLOGICAL EXAM:  MENTAL STATUS: Speech:    Speech is normal; fluent and spontaneous with normal comprehension.  Cognition:     Orientation to time, place and person     Normal recent and remote memory     Normal Attention span and concentration     Normal Language, naming, repeating,spontaneous speech     Fund of knowledge   CRANIAL NERVES: CN II: Visual fields are full to confrontation. Fundoscopic exam is normal with sharp discs and no vascular changes. Pupils are round equal and briskly reactive to light. CN III, IV, VI: extraocular movement are normal. No ptosis. CN V: Facial sensation is intact to pinprick in all 3 divisions bilaterally. Corneal responses are intact.  CN VII: Face is symmetric with normal eye closure and smile. CN VIII: Hearing is normal to rubbing fingers CN IX, X: Palate elevates symmetrically. Phonation is normal. CN XI: Head turning and shoulder shrug are intact CN XII: Tongue is midline with normal movements and no atrophy.  MOTOR: There is no pronator drift of out-stretched arms. Muscle bulk and tone are normal. Muscle strength is normal.  REFLEXES: Reflexes are 2+ and symmetric at the biceps, triceps, knees, and ankles. Plantar responses are flexor.  SENSORY: Intact to light touch, pinprick, positional sensation and vibratory sensation are intact in fingers and toes.  COORDINATION: Rapid alternating movements and fine finger movements are intact. There is no dysmetria on finger-to-nose and heel-knee-shin.     GAIT/STANCE: Posture is normal. Gait is steady with normal steps, base, arm swing, and turning. Heel and toe walking are normal. Tandem gait is normal.  Romberg is absent.  DIAGNOSTIC DATA (LABS, IMAGING, TESTING) - I reviewed patient records, labs, notes, testing and imaging myself where available.   ASSESSMENT AND PLAN  Kelly Mcclure is a 36 y.o. female    Idiopathic generalized epilepsy, currently [redacted] weeks pregnant,  Recurrent generalized seizure on August 11 Mcclure while she was 6 months pregnant  EEG showed frequent generalized epileptiform discharge,  She is on lower dose of antiepileptic medication based on levels  Currently taking lamotrigine XR 200 mg 2 tablets twice a day, Keppra 750 mg 2 tablets twice a day  Will further adjust level based on antiepileptic medication levels.          Marcial Pacas, M.D. Ph.D.  [facility] [address] [place], [place] [address] Ph: [phone] Fax: [phone]  CC: Referring Provider

## 2017-06-24 LAB — LEVETIRACETAM LEVEL: Levetiracetam Lvl: 24.8 ug/mL (ref 10.0–40.0)

## 2017-06-24 LAB — LAMOTRIGINE LEVEL: Lamotrigine Lvl: 13.7 ug/mL (ref 2.0–20.0)

## 2017-06-29 ENCOUNTER — Telehealth: Payer: Self-pay | Admitting: Neurology

## 2017-06-29 ENCOUNTER — Encounter: Payer: Self-pay | Admitting: *Deleted

## 2017-06-29 NOTE — Telephone Encounter (Signed)
Ref Range & Units 7d ago 3wk ago 61mo ago 12mo ago  Lamotrigine Lvl 2.0 - 20.0 ug/mL 13.7  10.4 CM 5.9 CM 4.3 CM  Comment:                Detection Limit = 1.0       Levetiracetam level  Order: [id number]  Status:  Final result Visible to patient:  No (Not Released) Next appt:  09/21/2017 at 11:00 AM in Internal Medicine Cruzita Lederer, CRISTINA, MD) Dx:  Seizure disorder ([place])   Ref Range & Units 7d ago 3wk ago 103mo ago 24mo ago  Levetiracetam Lvl 10.0 - 40.0 ug/mL 24.8  19.2  14.0  10.9   Resulting Agency  [facility]       Please call patient, repeat lamotrigine level was 13.7, previous was 10.4, Keppra level was 24.8, previous was 19.2,  She may continue to decrease her Keppra level to 750 mg 1 in the morning, 2 at night, may keep current dose of lamotrigine xr 200 mg 2 tablets twice a day  Also check to see antiepileptic medication list before she got pregnant

## 2017-06-29 NOTE — Telephone Encounter (Signed)
Spoke to patient - she is aware of results and recommended medication changes.  She verbalized understanding and also wrote the medication instructions down. She is unable to remember her medication doses prior to pregnancy.

## 2017-06-29 NOTE — Telephone Encounter (Signed)
Left message for a return call

## 2017-06-29 NOTE — Telephone Encounter (Signed)
Left message requesting a return call.

## 2017-06-29 NOTE — Telephone Encounter (Signed)
Patient returned call, please call and advise.

## 2017-07-01 ENCOUNTER — Other Ambulatory Visit: Payer: Self-pay | Admitting: *Deleted

## 2017-07-01 ENCOUNTER — Encounter: Payer: Self-pay | Admitting: Neurology

## 2017-07-01 DIAGNOSIS — G40909 Epilepsy, unspecified, not intractable, without status epilepticus: Secondary | ICD-10-CM

## 2017-07-01 MED ORDER — LEVETIRACETAM 750 MG PO TABS: ORAL_TABLET | ORAL | 11 refills | Status: DC

## 2017-07-14 ENCOUNTER — Other Ambulatory Visit: Payer: Self-pay | Admitting: Obstetrics and Gynecology

## 2017-07-20 ENCOUNTER — Encounter: Payer: Self-pay | Admitting: Internal Medicine

## 2017-07-20 NOTE — Progress Notes (Signed)
Received labs from 07/08/2017: TSH 0.19, free T3 3.2, free T4 1.42 Patient is currently on levothyroxine 175 mcg daily from before the pregnancy.  I will ask her to take this dose only 6 out of 7 days and will plan to recheck her thyroid test when she comes back in February.  At that time, if the tests are normal, we can switch to 150 mcg daily.

## 2017-07-29 ENCOUNTER — Encounter: Payer: Self-pay | Admitting: Neurology

## 2017-07-29 ENCOUNTER — Telehealth: Payer: Self-pay | Admitting: *Deleted

## 2017-07-29 NOTE — Telephone Encounter (Signed)
Spoke to patient. She is reporting seizure-like activity - intermittent spacing out episodes accompanied by delayed thought process, word finding difficulty and decreased ability to focus.  She is currently taking Lamictal XR 200mg , 2 tablets twice daily and Keppra 750mg , one tab in am and two tabs in pm.  She was previously taking Keppra 750mg , 2 tabs BID and the dosage was reduced at the end of November.  She feels her symptoms presented after this dose reduction.  She is concerned about her current medications not preventing breakthrough seizures. Says she has not missed any medication.  She has also been sleep deprived due to having two month old baby up multiple times during the night.  She is getting help from her family to see if better sleep will help her symptoms.

## 2017-07-29 NOTE — Telephone Encounter (Signed)
Left patient message requesting a call back for further discussion of concerns.  Received email below:  [place]. I hope you had a Merry Christmas and happy holiday. I'm sending a message today because my parents said I have been "spacy" lately. I'm not certain that it isn't due to lack of sleep with having a 81 month old baby. I do realize that I have had some moments but I know it's when I feel exceptionally tired. Luckily, my parents have come to help me so I can get some sleep. But they asked that I see what should I do or can I do? Change my medication? Please let me know. Thank you.     Philippa Sicks

## 2017-07-30 ENCOUNTER — Other Ambulatory Visit: Payer: Self-pay | Admitting: *Deleted

## 2017-07-30 DIAGNOSIS — G40909 Epilepsy, unspecified, not intractable, without status epilepticus: Secondary | ICD-10-CM

## 2017-07-30 MED ORDER — LEVETIRACETAM 750 MG PO TABS: ORAL_TABLET | ORAL | 0 refills | Status: DC

## 2017-07-30 NOTE — Telephone Encounter (Signed)
Returned call to patient - she is agreeable to increase her Keppra to 1500mg , BID and continue her current Lamictal dose.  She will need a new prescription sent to the pharmacy.  She was instructed to call us back if she continues to have seizure-like episodes after the medication increase.

## 2017-07-30 NOTE — Telephone Encounter (Signed)
Left message requesting a return call.

## 2017-07-30 NOTE — Telephone Encounter (Signed)
Pt has returned the call to [place], she is asking for a returned call

## 2017-07-30 NOTE — Telephone Encounter (Signed)
I reviewed chart and notes.   May offer patient patient to increase levetiracetam (Keppra) back up to 1500mg  twice a day.   Continue current lamictal XR 400mg  twice a day.     Penni Bombard, MD 21/10/1279, 18:86 AM Certified in Neurology, Neurophysiology and Neuroimaging  [facility] [address], [place], [place] [address] [phone]

## 2017-08-25 ENCOUNTER — Other Ambulatory Visit: Payer: Self-pay | Admitting: Internal Medicine

## 2017-08-25 ENCOUNTER — Encounter: Payer: Self-pay | Admitting: Internal Medicine

## 2017-08-25 DIAGNOSIS — E890 Postprocedural hypothyroidism: Secondary | ICD-10-CM

## 2017-08-28 ENCOUNTER — Other Ambulatory Visit: Payer: Self-pay | Admitting: Internal Medicine

## 2017-08-28 ENCOUNTER — Other Ambulatory Visit: Payer: Self-pay | Admitting: Diagnostic Neuroimaging

## 2017-08-28 DIAGNOSIS — G40909 Epilepsy, unspecified, not intractable, without status epilepticus: Secondary | ICD-10-CM

## 2017-08-31 NOTE — Telephone Encounter (Signed)
Is this okay to refill? 

## 2017-08-31 NOTE — Telephone Encounter (Signed)
Okay to refill for 1 month, since I am planning to recheck her tests in February, when she comes back to see me

## 2017-09-01 ENCOUNTER — Encounter: Payer: Self-pay | Admitting: Internal Medicine

## 2017-09-03 ENCOUNTER — Telehealth: Payer: Self-pay

## 2017-09-03 NOTE — Telephone Encounter (Signed)
Called [facility] and they stated that on 08/28/17 labs were drawn  TSH: 1.28 Free T4: 1.31

## 2017-09-21 ENCOUNTER — Ambulatory Visit: Payer: 59 | Admitting: Internal Medicine

## 2017-09-21 ENCOUNTER — Encounter: Payer: Self-pay | Admitting: Internal Medicine

## 2017-09-21 DIAGNOSIS — E890 Postprocedural hypothyroidism: Secondary | ICD-10-CM

## 2017-09-21 NOTE — Patient Instructions (Signed)
Please continue Synthroid 175 mcg daily 6/7 days.  Take the thyroid hormone every day, with water, at least 30 minutes before breakfast, separated by at least 4 hours from: - acid reflux medications - calcium - iron - multivitamins  Please come back for labs in 2 weeks for labs.  Please come back for a follow-up appointment in 6 months.

## 2017-09-21 NOTE — Progress Notes (Signed)
Patient ID: Kelly Mcclure, female   DOB: 10-05-1980, 37 y.o.   MRN: [id number]    HPI  Kelly Mcclure is a 37 y.o.-year-old female, initially referred by her ObGyn Dr., Dr. 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 Last visit 6 months ago.She gave birth in 05/2017 (cesarean section). She can now drive >> feels better.  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 She was started on Synthroid 2003 (!) soon after.  She had a thyroid uptake and scan >> normal uptake but again demonstrated hot nodule on scan >> RAI treatment in 12/2009 (Dr. Chalmers Mcclure), after which she developed hypothyroidism >> on Levothyroxine.  Pt is on Synthroid d.a.w. 150 mcg daily (175 mcg 6/7 days), taken: - in am - fasting - at least 30 min from b'fast - no Ca, Fe, PPIs - + prenatal MVIs at night  - not on Biotin  Before the pregnancy, her levothyroxine dose was 137 mcg.  I reviewed patient's thyroid tests: 08/28/2017: TSH: 1.28, Free T4: 1.31 07/08/2017: TSH 0.19, free T3 3.2, free T4 1.42 - on levothyroxine 175 mcg daily >> decreased to 6 out of 7 days  04/30/2017  TSH 1.4, free T4 1.48, free T3 3.1 02/19/2017. These were perfect: TSH 1.7, free T4 1.57 (0.82-1.77), free t3 3.4 (2-4.4).  12/10/2016: TSH 0.54 (0.4-4), free T3 3.3 (2-4.4), free T4 1.79 (0.82-1.77) - on LT4 175 mcg 11/11/2016: TSH 0.58 (0.4-4), free T3 3.3 (2-4.4), free T4 1.79 (0.82-1.77) - on LT4 175 mcg 10/2016: TSH 14 - on LT4 137 mcg  Lab Results  Component Value Date   TSH 5.78 (H) 03/20/2017   TSH 5.27 01/16/2017   FREET4 0.91 03/20/2017    Pt denies: - feeling nodules in neck - hoarseness - dysphagia - choking - SOB with lying down  She has + FH of thyroid disorders in: great aunt. + poss FH of thyroid cancer. No h/o radiation tx to head or neck. No seaweed or kelp. No recent contrast studies. No herbal supplements. No Biotin use. No recent  steroids use.   Pt. also has a history of cervical CA >> had conization and cerclage. She has a h/o absence seizures. On Lamictal and Keppra.  ROS: Constitutional: no weight gain/no weight loss, no fatigue, no subjective hyperthermia, no subjective hypothermia Eyes: no blurry vision, no xerophthalmia ENT: no sore throat, + see HPI Cardiovascular: no CP/no SOB/no palpitations/no leg swelling Respiratory: no cough/no SOB/no wheezing Gastrointestinal: no N/no V/no D/no C/no acid reflux Musculoskeletal: no muscle aches/no joint aches Skin: no rashes, no hair loss Neurological: no tremors/no numbness/no tingling/no dizziness  I reviewed pt's medications, allergies, PMH, social hx, family hx, and changes were documented in the history of present illness. Otherwise, unchanged from my initial visit note.  Past Medical History:  Diagnosis Date  . Abnormal liver enzymes   . Cancer (HCC)    hx of cervical cancer  . Complication of anesthesia    after some surgeries - pt woke up with migraine  . GERD (gastroesophageal reflux disease)    with pregnancy  . H/O radioactive iodine thyroid ablation 12/2009  . 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 History of kidney stones 2011  . Hx of varicella   . Hypothyroidism   . Migraines   . Seizure disorder ([place])    Diagnosed at age 70  . Seizures (HCC)    clonic tonic  seizures - will "space out " for a very brief moment.  take medication for seizures. HX OF MAJOR SEIZURES - LOOSING CONSCIOUSNESS-- APPROX 10 TO 12 YRS AGO- NEUROLOGIST IS DR. Raliegh Ip. Mcclure IN [place], absent seizures  . 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 Vaginal Pap smear, abnormal    Past Surgical History:  Procedure Laterality Date  . ABDOMINAL CERCLAGE N/A 11/19/2016   Procedure: LAPAROSCOPIC TRANSABDOMINAL CERVICAL-ISTHMUSx2;  Surgeon: Governor Specking, MD;  Location: [facility];  Service: Gynecology;  Laterality: N/A;  with ultrasound  guidance. Start laparoscopically HOLD OPEN ABDOMINAL ITEMS   . ADENOIDECTOMY  1995  . CERCLAGE LAPAROSCOPIC ABDOMINAL N/A 11/19/2016   Procedure: CERCLAGE LAPAROSCOPIC ABDOMINAL;  Surgeon: Governor Specking, MD;  Location: [facility];  Service: Gynecology;  Laterality: N/A;  . CESAREAN SECTION N/A 05/27/2017   Procedure: Primary CESAREAN SECTION;  Surgeon: Brien Few, MD;  Location: [facility];  Service: Obstetrics;  Laterality: N/A;  EDD: 06/07/17 Allergy: Adhesive, Relpax, Sulfa, Ultram  . cold knife conization    . DILATION AND CURETTAGE OF UTERUS  2009  . DILATION AND CURETTAGE OF UTERUS N/A 03/01/2013   Procedure: CERVICAL DILATATION AND ENDOCERVICAL CURRETAGE AND VAAGINAL BIOPIES;  Surgeon: Alvino Chapel, MD;  Location: [facility];  Service: Gynecology;  Laterality: N/A;  . LEEP  02/25/2010  . LEEP  07/25/11  . MOUTH SURGERY    . NASAL SEPTUM SURGERY  2006   Repair of deviated septum  . SIGMOIDOSCOPY    . TONSILLECTOMY  1995   Social History   Social History  . Marital status: Engaged    Spouse name: N/A  . Number of children: 0   Occupational History  . Triage Administrative Coordinator at [employer] Topics  . Smoking status: Never Smoker  . Smokeless tobacco: Never Used  . Alcohol use Yes     Comment: Rare  . Drug use: No   Current Outpatient Medications on File Prior to Visit  Medication Sig Dispense Refill  . acetaminophen (TYLENOL) 500 MG tablet Take 1,000 mg by mouth every 6 (six) hours as needed for moderate pain or headache.     . ferrous sulfate (FERROUSUL) 325 (65 FE) MG tablet Take 1 tablet (325 mg total) by mouth daily with breakfast. 30 tablet 3  . FLUoxetine (PROZAC) 20 MG capsule Take 1 capsule (20 mg total) by mouth at bedtime. 30 capsule 5  . folic acid (FOLVITE) 1 MG tablet Take 4 mg by mouth daily.  3  . LamoTRIgine XR (LAMICTAL XR) 200 MG TB24 Take 400 mg 2 (two) times daily by mouth.    . levETIRAcetam (KEPPRA)  750 MG tablet TAKE TWO TABLETS TWICE DAILY. 360 tablet 1  . loratadine (CLARITIN) 10 MG tablet Take 1 tablet (10 mg total) by mouth daily. 90 tablet 1  . oxyCODONE-acetaminophen (PERCOCET/ROXICET) 5-325 MG tablet Take 1-2 tablets by mouth every 4 (four) hours as needed (pain scale > 7). 30 tablet 0  . Prenatal Vit-Fe Fumarate-FA (MULTIVITAMIN-PRENATAL) 27-0.8 MG TABS tablet Take 1 tablet by mouth daily at 12 noon.    . senna-docusate (SENOKOT-S) 8.6-50 MG tablet Take 2 tablets by mouth at bedtime. 30 tablet 1  . SYNTHROID 175 MCG tablet TAKE 1 TABLET (175 MCG TOTAL) BY MOUTH DAILY BEFORE BREAKFAST. 30 tablet 0   No current facility-administered medications on file prior to visit.    Allergies  Allergen Reactions  . Sulfa Antibiotics Other (See  Comments)    Tongue swelling, throat irritated  . Adhesive [Tape] Other (See Comments)    "skin burn"  . Other Swelling and Other (See Comments)    Magic mouth wash/ causes swelling of the tongue  . Relpax [Eletriptan Hydrobromide] Other (See Comments)    Stroke-like symptoms  . Ultram [Tramadol] Other (See Comments)    seizures   Family History  Problem Relation Age of Onset  . Leukemia Other   . Diabetes Other   . Diabetes Mother   . Skin cancer Mother   . Hyperlipidemia Mother   . Hypertension Father   . Asthma Father   . Skin cancer Maternal Aunt   . Multiple myeloma Maternal Aunt   . Testicular cancer Maternal Uncle   . Hyperlipidemia Maternal Grandmother   . AAA (abdominal aortic aneurysm) Maternal Grandmother   . Prostate cancer Maternal Grandfather   . AAA (abdominal aortic aneurysm) Maternal Grandfather    PE: BP 125/80 (BP Location: Left Arm, Patient Position: Sitting, Cuff Size: Normal)   Pulse 85   Ht '5\' 5"'  (1.651 m)   Wt 154 lb (69.9 kg)   BMI 25.63 kg/m  Wt Readings from Last 3 Encounters:  09/21/17 154 lb (69.9 kg)  06/23/17 155 lb 8 oz (70.5 kg)  05/27/17 181 lb (82.1 kg)   Constitutional: normal weight, in  NAD Eyes: PERRLA, EOMI, no exophthalmos ENT: moist mucous membranes, no thyromegaly, no cervical lymphadenopathy Cardiovascular: RRR, No MRG Respiratory: CTA B Gastrointestinal: abdomen soft, NT, ND, BS+ Musculoskeletal: no deformities, strength intact in all 4 Skin: moist, warm, no rashes Neurological: no tremor with outstretched hands, DTR normal in all 4  ASSESSMENT: 1. Postablative Hypothyroidism  PLAN:  1. Patient with long-standing post ablative hypothyroidism, on Synthroid d.a.w., currently on 150 mcg daily, decreased from 175 mcg daily, which she was taking during her pregnancy.  Before the pregnancy, she was on 137 mcg daily. - latest thyroid labs reviewed with pt >> normal  - she continues on a LT4 equivalent of 150 mcg daily - will need to change to stable daily dosing when results are back  - pt feels good on this dose. - we discussed about taking the thyroid hormone every day, with water, >30 minutes before breakfast, separated by >4 hours from acid reflux medications, calcium, iron, multivitamins. Pt. is taking it correctly. - will check thyroid tests in 2 weeks (~ 5 weeks from the previous set): TSH and fT4 - If labs are abnormal, she will need to return for repeat TFTs in 1.5 months - OTW, RTC in 6 mo  Needs refills after results are back in 2 weeks.  - time spent with the patient: 15 min, of which >50% was spent in obtaining information about her symptoms, reviewing her previous labs, evaluations, and treatments, counseling her about her condition (please see the discussed topics above), and developing a plan to further investigate it.  Marland Kitchen03/07/19 She did not coe back for labs.  Philemon Kingdom, MD PhD [facility]

## 2017-09-30 ENCOUNTER — Other Ambulatory Visit: Payer: Self-pay | Admitting: Internal Medicine

## 2017-10-06 ENCOUNTER — Encounter: Payer: Self-pay | Admitting: Neurology

## 2017-12-07 ENCOUNTER — Other Ambulatory Visit: Payer: Self-pay | Admitting: Internal Medicine

## 2017-12-08 ENCOUNTER — Other Ambulatory Visit: Payer: Self-pay

## 2017-12-09 ENCOUNTER — Encounter: Payer: Self-pay | Admitting: Internal Medicine

## 2017-12-09 ENCOUNTER — Other Ambulatory Visit: Payer: Self-pay | Admitting: Internal Medicine

## 2017-12-09 DIAGNOSIS — E890 Postprocedural hypothyroidism: Secondary | ICD-10-CM

## 2017-12-09 MED ORDER — SYNTHROID 150 MCG PO TABS: 150.0000 ug | ORAL_TABLET | Freq: Every day | ORAL | 3 refills | Status: DC

## 2017-12-09 NOTE — Progress Notes (Signed)
Received labs from [facility], drawn on 12/07/2017: TSH 0.85, free T3 3.0, free T4 1.29, all normal She is currently taking 175 mcg Synthroid 6 out of 7 days, which is the equivalent of 150 mcg daily.  We discussed about changing to 150 mcg tablets when results are back.  I will ask her if she wants to do this now.

## 2017-12-14 ENCOUNTER — Encounter: Payer: Self-pay | Admitting: Neurology

## 2017-12-16 ENCOUNTER — Telehealth: Payer: Self-pay | Admitting: Neurology

## 2017-12-16 MED ORDER — ALPRAZOLAM 0.5 MG PO TABS: 0.5000 mg | ORAL_TABLET | Freq: Every evening | ORAL | 0 refills | Status: DC | PRN

## 2017-12-16 NOTE — Telephone Encounter (Signed)
Reviewed her chart, patient has epilepsy disorder, abnormal EEG,  She is supposed to take lamotrigine XR 200 mg 2 tablets twice a day, and Keppra 750 mg 2 tablets twice a day,  Now also on BuSpar 5 mg twice a day, Prozac 20 mg at bedtime  If she still has significant anxiety, she may consider low-dose of Xanax 0.5mg  as needed, 30 tab rx was given,    she has appointment on Dec 21, 2017,

## 2017-12-16 NOTE — Telephone Encounter (Signed)
Replied to patient's email with Dr. Rhea Belton recommendations.

## 2017-12-21 ENCOUNTER — Ambulatory Visit: Payer: 59 | Admitting: Neurology

## 2017-12-21 ENCOUNTER — Encounter: Payer: Self-pay | Admitting: Neurology

## 2017-12-21 VITALS — BP 108/75 | HR 78 | Ht 65.0 in | Wt 156.0 lb

## 2017-12-21 DIAGNOSIS — F329 Major depressive disorder, single episode, unspecified: Secondary | ICD-10-CM | POA: Diagnosis not present

## 2017-12-21 DIAGNOSIS — F32A Depression, unspecified: Secondary | ICD-10-CM

## 2017-12-21 DIAGNOSIS — G40909 Epilepsy, unspecified, not intractable, without status epilepticus: Secondary | ICD-10-CM | POA: Diagnosis not present

## 2017-12-21 MED ORDER — LAMOTRIGINE ER 200 MG PO TB24: 400.0000 mg | ORAL_TABLET | Freq: Two times a day (BID) | ORAL | 4 refills | Status: DC

## 2017-12-21 MED ORDER — LAMOTRIGINE ER 100 MG PO TB24: 100.0000 mg | ORAL_TABLET | Freq: Every day | ORAL | 11 refills | Status: DC

## 2017-12-21 MED ORDER — LEVETIRACETAM 750 MG PO TABS: 1500.0000 mg | ORAL_TABLET | Freq: Two times a day (BID) | ORAL | 4 refills | Status: DC

## 2017-12-21 NOTE — Progress Notes (Signed)
  PATIENT: Kelly Mcclure DOB: 10/07/1980  Chief Complaint  Patient presents with  . Seizures    She is here with her mother. [name]. She has been under added stress recently due to the sudden death of a close friend.  She has not started the Xanax that was sent to the pharmacy for her.  Over the past two days, she has been experiencing eye rolling and difficulty carrying on a conversation.       HISTORICAL  Kelly Mcclure is a 37 years old right-handed female, accompanied by her husband [name], seen in refer by her obstetrician Dr.Taavon, Richard,for evaluation of seizure, initial evaluation was on March 19 2017  She had past medical history of cervical cancer, GERD, radioactive iodine thyroid ablation, hypothyroidism, chronic migraine, history of kidney stone, epilepsy, she is currently [redacted] weeks pregnant  She was diagnosed with epilepsy since 2002, has been under the care of [facility] neurologist Dr. Keith Miller.  I reviewed the most recent evaluation by Dr. Keith Miller on January 23 2017,   In June of 2002, she presented her first generalized tonic-clonic seizure while having the vacation at the beach, this happened in the setting of sleep deprivation, mild alcohol use  MRI of the brain, MRA of the brain showed no significant abnormality.  EEG at that time demonstrate generalized epileptiform discharge per description. She was started on Depakote 250 mg 3 times a day, she reported significant weight gain within short period of time, then she was switched to Topamax for extended period of time untill 2011, she was diagnosed with kidney stone.  She was switched to lamotrigine since 2011, at later multiple follow-up visits, there was description of spacing out spells, no myoclonic jerking, Keppra was added on later, which has helped her spells.but she still has occasionally recurrent spells  She  has been doing well taking Keppra 500 mg 3 tablets twice a day, lamotrigine 150 mg 3  tablets twice a day,she is currently [redacted] weeks pregnant, but still has occasionally space out spells, eyes rolled back, transient lapse of time, lasting few seconds.  On March 14 2017, when she was 6 months pregnant, her husband heard a loud screaming noise, saw patient body tense up, foaming coming out of her mouth,lasting for few minutes, was taken to the emergency room by ambulance  Laboratory evaluations UA showed large amount of leukocytes, protein was elevated 100,UDS was negative, CBC was within normal limits, hemoglobin was 12.3,CMP showed low potassium 3.0,creatinine 0.56  Lamictal level was 2.3, 24 hours urine protein was less than 6, within normal limit  I personally reviewed MRI of the brain without contrast on March 16 2017,that was normal  She was given IV magnesium infusion, now she is on higher dose of lamotrigine XR 400 plus 50 mg twice a day, Keppra 1000 plus 750 mg twice a day, she tolerated the medication well,  UPDATE Jun 23 2017: Patient had elective C-section on May 27 2017, with healthy baby girl  Before delivery, May 25 2017, lamotrigine level 5.9, Keppra 14,  Postpartum November 1, lamotrigine 10.4, Keppra level 19.2,  On Jun 08 2017 She decreased her lamotrigine xr 200 mg to 2 tablets twice a day, stop 50 mg tablets  Decrease Keppra to 750 mg tablet 2 tablets twice a day  She is doing well there was no recurrent seizure she is not breast-feeding  UPDATE Dec 21 2017: She was doing really well until recently, her best friend passed away, she   has worsening depression anxiety, has not been resting well, in the past few days, had recurrent spells of spacing out, her eyes rolled back, last less than 1 minute, no generalized seizure.  She is taking lamotrigine ER 200 mg 2 tablets twice a day, and Keppra 750 mg 2 tablets twice a day   REVIEW OF SYSTEMS: Full 14 system review of systems performed and notable only for fatigue, environmental allergy, headaches,  speech difficulty, daytime sleepiness, snoring, speech difficulty, decreased concentration, depression, anxiety  ALLERGIES: Allergies  Allergen Reactions  . Sulfa Antibiotics Other (See Comments)    Tongue swelling, throat irritated  . Adhesive [Tape] Other (See Comments)    "skin burn"  . Other Swelling and Other (See Comments)    Magic mouth wash/ causes swelling of the tongue  . Relpax [Eletriptan Hydrobromide] Other (See Comments)    Stroke-like symptoms  . Ultram [Tramadol] Other (See Comments)    seizures    HOME MEDICATIONS: Current Outpatient Medications  Medication Sig Dispense Refill  . ALPRAZolam (XANAX) 0.5 MG tablet Take 1 tablet (0.5 mg total) by mouth at bedtime as needed for anxiety. 30 tablet 0  . busPIRone (BUSPAR) 5 MG tablet Take 5 mg by mouth 2 (two) times daily.  0  . FLUoxetine (PROZAC) 40 MG capsule Take 40 mg by mouth daily.  0  . folic acid (FOLVITE) 1 MG tablet Take 4 mg by mouth daily.  3  . LamoTRIgine XR (LAMICTAL XR) 200 MG TB24 Take 400 mg 2 (two) times daily by mouth.    . levETIRAcetam (KEPPRA) 750 MG tablet TAKE TWO TABLETS TWICE DAILY. 360 tablet 1  . SYNTHROID 150 MCG tablet Take 1 tablet (150 mcg total) by mouth daily before breakfast. 90 tablet 3  . loratadine (CLARITIN) 10 MG tablet Take 1 tablet (10 mg total) by mouth daily. 90 tablet 1   No current facility-administered medications for this visit.     PAST MEDICAL HISTORY: Past Medical History:  Diagnosis Date  . Abnormal liver enzymes   . Cancer (HCC)    hx of cervical cancer  . Complication of anesthesia    after some surgeries - pt woke up with migraine  . GERD (gastroesophageal reflux disease)    with pregnancy  . H/O radioactive iodine thyroid ablation 12/2009  . 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 History of kidney stones 2011  . Hx of varicella   . Hypothyroidism   . Migraines   . Seizure disorder ([place])    Diagnosed at age 22  . Seizures ([place])     clonic tonic seizures - will "space out " for a very brief moment.  take medication for seizures. HX OF MAJOR SEIZURES - LOOSING CONSCIOUSNESS-- APPROX 10 TO 12 YRS AGO- NEUROLOGIST IS DR. Raliegh Ip. MILLER IN [place], absent seizures  . 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 Vaginal Pap smear, abnormal     PAST SURGICAL HISTORY: Past Surgical History:  Procedure Laterality Date  . ABDOMINAL CERCLAGE N/A 11/19/2016   Procedure: LAPAROSCOPIC TRANSABDOMINAL CERVICAL-ISTHMUSx2;  Surgeon: Governor Specking, MD;  Location: [facility];  Service: Gynecology;  Laterality: N/A;  with ultrasound guidance. Start laparoscopically HOLD OPEN ABDOMINAL ITEMS   . ADENOIDECTOMY  1995  . CERCLAGE LAPAROSCOPIC ABDOMINAL N/A 11/19/2016   Procedure: CERCLAGE LAPAROSCOPIC ABDOMINAL;  Surgeon: Governor Specking, MD;  Location: [facility];  Service: Gynecology;  Laterality: N/A;  . CESAREAN SECTION N/A 05/27/2017   Procedure:  Primary CESAREAN SECTION;  Surgeon: Taavon, Richard, MD;  Location: [facility];  Service: Obstetrics;  Laterality: N/A;  EDD: 06/07/17 Allergy: Adhesive, Relpax, Sulfa, Ultram  . cold knife conization    . DILATION AND CURETTAGE OF UTERUS  2009  . DILATION AND CURETTAGE OF UTERUS N/A 03/01/2013   Procedure: CERVICAL DILATATION AND ENDOCERVICAL CURRETAGE AND VAAGINAL BIOPIES;  Surgeon: Daniel L Clarke-Pearson, MD;  Location: [facility];  Service: Gynecology;  Laterality: N/A;  . LEEP  02/25/2010  . LEEP  07/25/11  . MOUTH SURGERY    . NASAL SEPTUM SURGERY  2006   Repair of deviated septum  . SIGMOIDOSCOPY    . TONSILLECTOMY  1995    FAMILY HISTORY: Family History  Problem Relation Age of Onset  . Leukemia Other   . Diabetes Other   . Diabetes Mother   . Skin cancer Mother   . Hyperlipidemia Mother   . Hypertension Father   . Asthma Father   . Skin cancer Maternal Aunt   . Multiple myeloma Maternal Aunt   . Testicular cancer Maternal Uncle   . Hyperlipidemia Maternal  Grandmother   . AAA (abdominal aortic aneurysm) Maternal Grandmother   . Prostate cancer Maternal Grandfather   . AAA (abdominal aortic aneurysm) Maternal Grandfather     SOCIAL HISTORY:  Social History   Social History  . Marital status: Single    Spouse name: N/A  . Number of children: N/A  . Years of education: N/A   Occupational History  . [employer] at [employer].   Social History Main Topics  . Smoking status: Never Smoker  . Smokeless tobacco: Never Used  . Alcohol use No     Comment: Rare  . Drug use: No  . Sexual activity: Yes   Other Topics Concern  . Not on file   Social History Narrative  . No narrative on file     PHYSICAL EXAM   Vitals:   12/21/17 1521  BP: 108/75  Pulse: 78  Weight: 156 lb (70.8 kg)  Height: 5' 5" (1.651 m)    Not recorded      Body mass index is 25.96 kg/m.  PHYSICAL EXAMNIATION:  Gen: NAD, conversant, well nourised, obese, well groomed                     Cardiovascular: Regular rate rhythm, no peripheral edema, warm, nontender. Eyes: Conjunctivae clear without exudates or hemorrhage Neck: Supple, no carotid bruits. Pulmonary: Clear to auscultation bilaterally   NEUROLOGICAL EXAM:  MENTAL STATUS: Speech:    Speech is normal; fluent and spontaneous with normal comprehension.  Cognition:     Orientation to time, place and person     Normal recent and remote memory     Normal Attention span and concentration     Normal Language, naming, repeating,spontaneous speech     Fund of knowledge   CRANIAL NERVES: CN II: Visual fields are full to confrontation. Fundoscopic exam is normal with sharp discs and no vascular changes. Pupils are round equal and briskly reactive to light. CN III, IV, VI: extraocular movement are normal. No ptosis. CN V: Facial sensation is intact to pinprick in all 3 divisions bilaterally. Corneal responses are intact.  CN VII: Face is symmetric with normal eye closure and smile. CN VIII:  Hearing is normal to rubbing fingers CN IX, X: Palate elevates symmetrically. Phonation is normal. CN XI: Head turning and shoulder shrug are intact CN XII: Tongue is midline with   normal movements and no atrophy.  MOTOR: There is no pronator drift of out-stretched arms. Muscle bulk and tone are normal. Muscle strength is normal.  REFLEXES: Reflexes are 2+ and symmetric at the biceps, triceps, knees, and ankles. Plantar responses are flexor.  SENSORY: Intact to light touch, pinprick, positional sensation and vibratory sensation are intact in fingers and toes.  COORDINATION: Rapid alternating movements and fine finger movements are intact. There is no dysmetria on finger-to-nose and heel-knee-shin.    GAIT/STANCE: Posture is normal. Gait is steady with normal steps, base, arm swing, and turning. Heel and toe walking are normal. Tandem gait is normal.  Romberg is absent.   DIAGNOSTIC DATA (LABS, IMAGING, TESTING) - I reviewed patient records, labs, notes, testing and imaging myself where available.   ASSESSMENT AND PLAN  Karissa M Huelsmann is a 37 y.o. female    Idiopathic generalized epilepsy, currently [redacted] weeks pregnant,  Recurrent generalized seizure on March 14 2017 while she was 6 months pregnant  EEG showed frequent generalized epileptiform discharge,  Still has occasionally spells of spacing out,  Currently taking lamotrigine XR 200 mg 2 tablets twice a day, Keppra 750 mg 2 tablets twice a day  We will add on one extra dose of lamotrigine XR 100mg qhs    Depression anxiety polypharmacy treatment  Xanax as needed.  Yijun Yan, M. D. Ph.D.  [facility] [address], [address] [place], [place] [address] Ph: [phone] Fax: [phone]  CC: Referring Provider 

## 2018-01-12 ENCOUNTER — Encounter: Payer: Self-pay | Admitting: Gynecologic Oncology

## 2018-01-18 ENCOUNTER — Telehealth: Payer: Self-pay | Admitting: *Deleted

## 2018-01-18 NOTE — Telephone Encounter (Signed)
Received patient's abnormal pap smear via fax from Dr. Cherylann Banas office. Scheduled the patient for an appt on 7/9

## 2018-01-19 ENCOUNTER — Ambulatory Visit: Payer: 59 | Admitting: Allergy and Immunology

## 2018-01-19 ENCOUNTER — Telehealth: Payer: Self-pay | Admitting: Allergy

## 2018-01-19 ENCOUNTER — Encounter: Payer: Self-pay | Admitting: Allergy and Immunology

## 2018-01-19 VITALS — BP 124/74 | HR 83 | Temp 98.2°F | Resp 16 | Ht 65.16 in | Wt 155.6 lb

## 2018-01-19 DIAGNOSIS — J3089 Other allergic rhinitis: Secondary | ICD-10-CM

## 2018-01-19 DIAGNOSIS — R05 Cough: Secondary | ICD-10-CM | POA: Diagnosis not present

## 2018-01-19 DIAGNOSIS — J4530 Mild persistent asthma, uncomplicated: Secondary | ICD-10-CM

## 2018-01-19 DIAGNOSIS — R053 Chronic cough: Secondary | ICD-10-CM

## 2018-01-19 HISTORY — DX: Mild persistent asthma, uncomplicated: J45.30

## 2018-01-19 MED ORDER — AZELASTINE HCL 0.1 % NA SOLN: 2.0000 | Freq: Two times a day (BID) | NASAL | 1 refills | Status: DC

## 2018-01-19 MED ORDER — ALBUTEROL SULFATE HFA 108 (90 BASE) MCG/ACT IN AERS
2.0000 | INHALATION_SPRAY | Freq: Four times a day (QID) | RESPIRATORY_TRACT | 1 refills | Status: DC | PRN
Start: 2018-01-19 — End: 2018-12-29

## 2018-01-19 MED ORDER — FLUTICASONE PROPIONATE HFA 110 MCG/ACT IN AERO: 2.0000 | INHALATION_SPRAY | Freq: Two times a day (BID) | RESPIRATORY_TRACT | 1 refills | Status: DC

## 2018-01-19 MED ORDER — CARBINOXAMINE MALEATE 4 MG PO TABS: 4.0000 mg | ORAL_TABLET | Freq: Four times a day (QID) | ORAL | 1 refills | Status: DC | PRN

## 2018-01-19 MED ORDER — MONTELUKAST SODIUM 10 MG PO TABS: 10.0000 mg | ORAL_TABLET | Freq: Every day | ORAL | 1 refills | Status: DC

## 2018-01-19 NOTE — Progress Notes (Signed)
New Patient Note  RE: Kelly Mcclure MRN: [id number] DOB: 1981/06/18 Date of Office Visit: 01/19/2018  Referring provider: Brien Few, MD Primary care provider: Brien Few, MD  Chief Complaint: Cough   History of present illness: Kelly Mcclure is a 37 y.o. female seen today in consultation requested by Brien Few, MD.  She complains of a waxing and waning cough which she has experienced over the past 2 years.  The cough is nonproductive and at times feels like it originates at the base of her throat.  She states that at times she has coughing spells so vigorous as to cause vomiting.  She was evaluated by an otolaryngologist who reportedly noted slight damage to her vocal cords from coughing, however no evidence of acid reflux was noted.  She was evaluated by pulmonologist who took an x-ray, which was read as normal, however no pulmonary function testing was performed and she was told that she had upper airway cough syndrome.  She experiences nasal congestion, rhinorrhea, does sense occasional postnasal drainage.  These symptoms occur year-round but tend to be more frequent during the springtime.  She currently attempts to control the symptoms with loratadine, however with minimal relief.  Recently, her dentist noted opacification of her right maxillary sinus, though Kelly Mcclure did not experience sinus symptoms.  She denies heartburn, water brash, and wheezing.  She states that on occasion she feels that she is "not able to breathe to the full capacity of my lungs", though she is unable to identify any specific triggers for this lower respiratory symptoms.  Assessment and plan: Cough, persistent The most common causes of chronic cough include the following: upper airway cough syndrome (UACS) which is caused by variety of rhinosinus conditions; asthma; gastroesophageal reflux disease (GERD); chronic bronchitis from cigarette smoking or other inhaled environmental irritants;  non-asthmatic eosinophilic bronchitis; and bronchiectasis. In prospective studies, these conditions have accounted for up to 94% of the causes of chronic cough in immunocompetent adults. The history and physical examination suggest that her cough is multifactorial with contribution from postnasal drainage and bronchial hyperresponsiveness. We will address these issues at this time.    A prescription has been provided for a high-flow flutter valve to be used as needed to break the coughing cycle.  Treatment plan as outlined below.    We will regroup in 8 weeks to assess treatment response and adjust therapy accordingly.  Perennial and seasonal allergic rhinitis  Aeroallergen avoidance measures have been discussed and provided in written form.  A prescription has been provided for carbinoxamine 4 mg every 6-8 hours if needed.  A prescription has been provided for montelukast 10 mg daily bedtime.  A prescription has been provided for azelastine nasal spray, 1-2 sprays per nostril 2 times daily as needed. Proper nasal spray technique has been discussed and demonstrated.   Nasal saline lavage (NeilMed) has been recommended as needed and prior to medicated nasal sprays along with instructions for proper administration.  If allergen avoidance measures and medications fail to adequately relieve symptoms, aeroallergen immunotherapy will be considered.  Mild persistent asthma Todays spirometry results, assessed while asymptomatic, suggest under-perception of bronchoconstriction.  A prescription has been provided for Flovent (fluticasone) 110 g,  2 inhalations twice a day. To maximize pulmonary deposition, a spacer has been provided along with instructions for its proper administration with an HFA inhaler.  A prescription has been provided for albuterol HFA, 1 to 2 inhalations every 6 hours if needed.  Subjective and objective measures of  pulmonary function will be followed and the treatment plan  will be adjusted accordingly.   Meds ordered this encounter  Medications  . Carbinoxamine Maleate 4 MG TABS    Sig: Take 1 tablet (4 mg total) by mouth every 6 (six) hours as needed.    Dispense:  28 each    Refill:  1  . montelukast (SINGULAIR) 10 MG tablet    Sig: Take 1 tablet (10 mg total) by mouth at bedtime.    Dispense:  30 tablet    Refill:  1  . azelastine (ASTELIN) 0.1 % nasal spray    Sig: Place 2 sprays into both nostrils 2 (two) times daily.    Dispense:  30 mL    Refill:  1  . fluticasone (FLOVENT HFA) 110 MCG/ACT inhaler    Sig: Inhale 2 puffs into the lungs 2 (two) times daily.    Dispense:  12 g    Refill:  1  . albuterol (PROVENTIL HFA;VENTOLIN HFA) 108 (90 Base) MCG/ACT inhaler    Sig: Inhale 2 puffs into the lungs every 6 (six) hours as needed for wheezing or shortness of breath.    Dispense:  1 Inhaler    Refill:  1    Diagnostics: Spirometry: Spirometry reveals an FVC of 3.68 L and an FEV1 of 2.83 L, predicted FEV1 is 3.20 L.  There was 270 mL postbronchodilator improvement.  This study was performed while the patient was asymptomatic.  Please see scanned spirometry results for details. Epicutaneous testing: Positive to tree pollen, mold, cat hair, dog epithelia, and dust mite antigen. Intradermal testing: Positive to grass pollen, ragweed pollen, weed pollen, and molds.    Physical examination: Blood pressure 124/74, pulse 83, temperature 98.2 F (36.8 C), temperature source Oral, resp. rate 16, height 5' 5.16" (1.655 m), weight 155 lb 9.6 oz (70.6 kg), SpO2 97 %, unknown if currently breastfeeding.  General: Alert, interactive, in no acute distress. HEENT: TMs pearly gray, turbinates moderately edematous without discharge, post-pharynx moderately erythematous. Neck: Supple without lymphadenopathy. Lungs: Clear to auscultation without wheezing, rhonchi or rales. CV: Normal S1, S2 without murmurs. Abdomen: Nondistended, nontender. Skin: Warm and dry,  without lesions or rashes. Extremities:  No clubbing, cyanosis or edema. Neuro:   Grossly intact.  Review of systems:  Review of systems negative except as noted in HPI / PMHx or noted below: Review of Systems  Constitutional: Negative.   HENT: Negative.   Eyes: Negative.   Respiratory: Negative.   Cardiovascular: Negative.   Gastrointestinal: Negative.   Genitourinary: Negative.   Musculoskeletal: Negative.   Skin: Negative.   Neurological: Negative.   Endo/Heme/Allergies: Negative.   Psychiatric/Behavioral: Negative.     Past medical history:  Past Medical History:  Diagnosis Date  . Abnormal liver enzymes   . Cancer (HCC)    hx of cervical cancer  . Complication of anesthesia    after some surgeries - pt woke up with migraine  . GERD (gastroesophageal reflux disease)    with pregnancy  . H/O radioactive iodine thyroid ablation 12/2009  . 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 History of kidney stones 2011  . Hx of varicella   . Hypothyroidism   . Migraines   . Mild persistent asthma 01/19/2018  . Seizure disorder ([place])    Diagnosed at age 64  . Seizures ([place])    clonic tonic seizures - will "space out " for a very brief moment.  take medication for seizures. HX OF  MAJOR SEIZURES - LOOSING CONSCIOUSNESS-- APPROX 10 TO 12 YRS AGO- NEUROLOGIST IS DR. Raliegh Ip. [facility] IN [place], absent seizures  . 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 Vaginal Pap smear, abnormal     Past surgical history:  Past Surgical History:  Procedure Laterality Date  . ABDOMINAL CERCLAGE N/A 11/19/2016   Procedure: LAPAROSCOPIC TRANSABDOMINAL CERVICAL-ISTHMUSx2;  Surgeon: Governor Specking, MD;  Location: [facility];  Service: Gynecology;  Laterality: N/A;  with ultrasound guidance. Start laparoscopically HOLD OPEN ABDOMINAL ITEMS   . ADENOIDECTOMY  1995  . CERCLAGE LAPAROSCOPIC ABDOMINAL N/A 11/19/2016   Procedure: CERCLAGE LAPAROSCOPIC ABDOMINAL;  Surgeon:  Governor Specking, MD;  Location: [facility];  Service: Gynecology;  Laterality: N/A;  . CESAREAN SECTION N/A 05/27/2017   Procedure: Primary CESAREAN SECTION;  Surgeon: Brien Few, MD;  Location: [facility];  Service: Obstetrics;  Laterality: N/A;  EDD: 06/07/17 Allergy: Adhesive, Relpax, Sulfa, Ultram  . cold knife conization    . DILATION AND CURETTAGE OF UTERUS  2009  . DILATION AND CURETTAGE OF UTERUS N/A 03/01/2013   Procedure: CERVICAL DILATATION AND ENDOCERVICAL CURRETAGE AND VAAGINAL BIOPIES;  Surgeon: Alvino Chapel, MD;  Location: [facility];  Service: Gynecology;  Laterality: N/A;  . LEEP  02/25/2010  . LEEP  07/25/11  . MOUTH SURGERY    . NASAL SEPTUM SURGERY  2006   Repair of deviated septum  . SIGMOIDOSCOPY    . TONSILLECTOMY  1995    Family history: Family History  Problem Relation Age of Onset  . Leukemia Other   . Diabetes Other   . Diabetes Mother   . Skin cancer Mother   . Hyperlipidemia Mother   . Hypertension Father   . Asthma Father   . Skin cancer Maternal Aunt   . Multiple myeloma Maternal Aunt   . Testicular cancer Maternal Uncle   . Hyperlipidemia Maternal Grandmother   . AAA (abdominal aortic aneurysm) Maternal Grandmother   . Prostate cancer Maternal Grandfather   . AAA (abdominal aortic aneurysm) Maternal Grandfather     Social history: Social History   Socioeconomic History  . Marital status: Single    Spouse name: Not on file  . Number of children: Not on file  . Years of education: Not on file  . Highest education level: Not on file  Occupational History  . Not on file  Social Needs  . Financial resource strain: Not on file  . Food insecurity:    Worry: Not on file    Inability: Not on file  . Transportation needs:    Medical: Not on file    Non-medical: Not on file  Tobacco Use  . Smoking status: Never Smoker  . Smokeless tobacco: Never Used  Substance and Sexual Activity  . Alcohol use: No    Comment: Rare  .  Drug use: No  . Sexual activity: Yes  Lifestyle  . Physical activity:    Days per week: Not on file    Minutes per session: Not on file  . Stress: Not on file  Relationships  . Social connections:    Talks on phone: Not on file    Gets together: Not on file    Attends religious service: Not on file    Active member of club or organization: Not on file    Attends meetings of clubs or organizations: Not on file    Relationship status: Not on file  . Intimate partner violence:  Fear of current or ex partner: Not on file    Emotionally abused: Not on file    Physically abused: Not on file    Forced sexual activity: Not on file  Other Topics Concern  . Not on file  Social History Narrative  . Not on file   Environmental History: The patient lives in a 35 year old house with carpeting throughout, gas heat, and central air.  There is no known mold/water damage in the home.  She is a non-smoker without pets.  Allergies as of 01/19/2018      Reactions   Sulfa Antibiotics Other (See Comments)   Tongue swelling, throat irritated   Adhesive [tape] Other (See Comments)   "skin burn"   Other Swelling, Other (See Comments)   Magic mouth wash/ causes swelling of the tongue   Relpax [eletriptan Hydrobromide] Other (See Comments)   Stroke-like symptoms   Ultram [tramadol] Other (See Comments)   seizures      Medication List        Accurate as of 01/19/18  2:52 PM. Always use your most recent med list.          albuterol 108 (90 Base) MCG/ACT inhaler Commonly known as:  PROVENTIL HFA;VENTOLIN HFA Inhale 2 puffs into the lungs every 6 (six) hours as needed for wheezing or shortness of breath.   ALPRAZolam 0.5 MG tablet Commonly known as:  XANAX Take 1 tablet (0.5 mg total) by mouth at bedtime as needed for anxiety.   azelastine 0.1 % nasal spray Commonly known as:  ASTELIN Place 2 sprays into both nostrils 2 (two) times daily.   busPIRone 5 MG tablet Commonly known as:   BUSPAR Take 5 mg by mouth 2 (two) times daily.   butalbital-acetaminophen-caffeine 50-325-40 MG tablet Commonly known as:  FIORICET, ESGIC Take 1 tablet by mouth every 4 (four) hours as needed for headache.   Carbinoxamine Maleate 4 MG Tabs Take 1 tablet (4 mg total) by mouth every 6 (six) hours as needed.   FLUoxetine 40 MG capsule Commonly known as:  PROZAC Take 40 mg by mouth daily.   fluticasone 110 MCG/ACT inhaler Commonly known as:  FLOVENT HFA Inhale 2 puffs into the lungs 2 (two) times daily.   folic acid 1 MG tablet Commonly known as:  FOLVITE Take 4 mg by mouth daily.   ipratropium 0.06 % nasal spray Commonly known as:  ATROVENT Place 1 spray into both nostrils 3 (three) times daily.   LamoTRIgine 200 MG Tb24 24 hour tablet Commonly known as:  LAMICTAL XR Take 2 tablets (400 mg total) by mouth 2 (two) times daily.   LamoTRIgine 100 MG Tb24 24 hour tablet Take 1 tablet (100 mg total) by mouth at bedtime.   levETIRAcetam 750 MG tablet Commonly known as:  KEPPRA Take 2 tablets (1,500 mg total) by mouth 2 (two) times daily.   loratadine 10 MG tablet Commonly known as:  CLARITIN Take 10 mg by mouth daily as needed for allergies.   loratadine 10 MG tablet Commonly known as:  CLARITIN Take 1 tablet (10 mg total) by mouth daily.   montelukast 10 MG tablet Commonly known as:  SINGULAIR Take 1 tablet (10 mg total) by mouth at bedtime.   SYNTHROID 150 MCG tablet Generic drug:  levothyroxine Take 1 tablet (150 mcg total) by mouth daily before breakfast.       Known medication allergies: Allergies  Allergen Reactions  . Sulfa Antibiotics Other (See Comments)    Tongue swelling,  throat irritated  . Adhesive [Tape] Other (See Comments)    "skin burn"  . Other Swelling and Other (See Comments)    Magic mouth wash/ causes swelling of the tongue  . Relpax [Eletriptan Hydrobromide] Other (See Comments)    Stroke-like symptoms  . Ultram [Tramadol] Other (See  Comments)    seizures    I appreciate the opportunity to take part in [facility] care. Please do not hesitate to contact me with questions.  Sincerely,   R. Edgar Frisk, MD

## 2018-01-19 NOTE — Assessment & Plan Note (Signed)
The most common causes of chronic cough include the following: upper airway cough syndrome (UACS) which is caused by variety of rhinosinus conditions; asthma; gastroesophageal reflux disease (GERD); chronic bronchitis from cigarette smoking or other inhaled environmental irritants; non-asthmatic eosinophilic bronchitis; and bronchiectasis. In prospective studies, these conditions have accounted for up to 94% of the causes of chronic cough in immunocompetent adults. The history and physical examination suggest that her cough is multifactorial with contribution from postnasal drainage and bronchial hyperresponsiveness. We will address these issues at this time.    A prescription has been provided for a high-flow flutter valve to be used as needed to break the coughing cycle.  Treatment plan as outlined below.    We will regroup in 8 weeks to assess treatment response and adjust therapy accordingly.

## 2018-01-19 NOTE — Assessment & Plan Note (Addendum)
   Aeroallergen avoidance measures have been discussed and provided in written form.  A prescription has been provided for carbinoxamine 4 mg every 6-8 hours if needed.  A prescription has been provided for montelukast 10 mg daily bedtime.  A prescription has been provided for azelastine nasal spray, 1-2 sprays per nostril 2 times daily as needed. Proper nasal spray technique has been discussed and demonstrated.   Nasal saline lavage (NeilMed) has been recommended as needed and prior to medicated nasal sprays along with instructions for proper administration.  If allergen avoidance measures and medications fail to adequately relieve symptoms, aeroallergen immunotherapy will be considered.

## 2018-01-19 NOTE — Assessment & Plan Note (Signed)
Todays spirometry results, assessed while asymptomatic, suggest under-perception of bronchoconstriction.  A prescription has been provided for Flovent (fluticasone) 110 g, 2 inhalations twice a day. To maximize pulmonary deposition, a spacer has been provided along with instructions for its proper administration with an HFA inhaler.  A prescription has been provided for albuterol HFA, 1 to 2 inhalations every 6 hours if needed.  Subjective and objective measures of pulmonary function will be followed and the treatment plan will be adjusted accordingly.

## 2018-01-19 NOTE — Patient Instructions (Addendum)
Cough, persistent The most common causes of chronic cough include the following: upper airway cough syndrome (UACS) which is caused by variety of rhinosinus conditions; asthma; gastroesophageal reflux disease (GERD); chronic bronchitis from cigarette smoking or other inhaled environmental irritants; non-asthmatic eosinophilic bronchitis; and bronchiectasis. In prospective studies, these conditions have accounted for up to 94% of the causes of chronic cough in immunocompetent adults. The history and physical examination suggest that her cough is multifactorial with contribution from postnasal drainage and bronchial hyperresponsiveness. We will address these issues at this time.    A prescription has been provided for a high-flow flutter valve to be used as needed to break the coughing cycle.  Treatment plan as outlined below.    We will regroup in 8 weeks to assess treatment response and adjust therapy accordingly.  Perennial and seasonal allergic rhinitis  Aeroallergen avoidance measures have been discussed and provided in written form.  A prescription has been provided for carbinoxamine 4 mg every 6-8 hours if needed.  A prescription has been provided for montelukast 10 mg daily bedtime.  A prescription has been provided for azelastine nasal spray, 1-2 sprays per nostril 2 times daily as needed. Proper nasal spray technique has been discussed and demonstrated.   Nasal saline lavage (NeilMed) has been recommended as needed and prior to medicated nasal sprays along with instructions for proper administration.  If allergen avoidance measures and medications fail to adequately relieve symptoms, aeroallergen immunotherapy will be considered.  Mild persistent asthma Todays spirometry results, assessed while asymptomatic, suggest under-perception of bronchoconstriction.  A prescription has been provided for Flovent (fluticasone) 110 g,  2 inhalations twice a day. To maximize pulmonary  deposition, a spacer has been provided along with instructions for its proper administration with an HFA inhaler.  A prescription has been provided for albuterol HFA, 1 to 2 inhalations every 6 hours if needed.  Subjective and objective measures of pulmonary function will be followed and the treatment plan will be adjusted accordingly.   Return in about 8 weeks (around 03/16/2018), or if symptoms worsen or fail to improve.   Reducing Pollen Exposure  The American Academy of Allergy, Asthma and Immunology suggests the following steps to reduce your exposure to pollen during allergy seasons.    1. Do not hang sheets or clothing out to dry; pollen may collect on these items. 2. Do not mow lawns or spend time around freshly cut grass; mowing stirs up pollen. 3. Keep windows closed at night.  Keep car windows closed while driving. 4. Minimize morning activities outdoors, a time when pollen counts are usually at their highest. 5. Stay indoors as much as possible when pollen counts or humidity is high and on windy days when pollen tends to remain in the air longer. 6. Use air conditioning when possible.  Many air conditioners have filters that trap the pollen spores. 7. Use a HEPA room air filter to remove pollen form the indoor air you breathe.   Control of House Dust Mite Allergen  House dust mites play a major role in allergic asthma and rhinitis.  They occur in environments with high humidity wherever human skin, the food for dust mites is found. High levels have been detected in dust obtained from mattresses, pillows, carpets, upholstered furniture, bed covers, clothes and soft toys.  The principal allergen of the house dust mite is found in its feces.  A gram of dust may contain 1,000 mites and 250,000 fecal particles.  Mite antigen is easily measured in  the air during house cleaning activities.    1. Encase mattresses, including the box spring, and pillow, in an air tight cover.  Seal the  zipper end of the encased mattresses with wide adhesive tape. 2. Wash the bedding in water of 130 degrees Farenheit weekly.  Avoid cotton comforters/quilts and flannel bedding: the most ideal bed covering is the dacron comforter. 3. Remove all upholstered furniture from the bedroom. 4. Remove carpets, carpet padding, rugs, and non-washable window drapes from the bedroom.  Wash drapes weekly or use plastic window coverings. 5. Remove all non-washable stuffed toys from the bedroom.  Wash stuffed toys weekly. 6. Have the room cleaned frequently with a vacuum cleaner and a damp dust-mop.  The patient should not be in a room which is being cleaned and should wait 1 hour after cleaning before going into the room. 7. Close and seal all heating outlets in the bedroom.  Otherwise, the room will become filled with dust-laden air.  An electric heater can be used to heat the room. Reduce indoor humidity to less than 50%.  Do not use a humidifier.  Control of Dog or Cat Allergen  Avoidance is the best way to manage a dog or cat allergy. If you have a dog or cat and are allergic to dog or cats, consider removing the dog or cat from the home. If you have a dog or cat but don't want to find it a new home, or if your family wants a pet even though someone in the household is allergic, here are some strategies that may help keep symptoms at bay:  1. Keep the pet out of your bedroom and restrict it to only a few rooms. Be advised that keeping the dog or cat in only one room will not limit the allergens to that room. 2. Don't pet, hug or kiss the dog or cat; if you do, wash your hands with soap and water. 3. High-efficiency particulate air (HEPA) cleaners run continuously in a bedroom or living room can reduce allergen levels over time. 4. Place electrostatic material sheet in the air inlet vent in the bedroom. 5. Regular use of a high-efficiency vacuum cleaner or a central vacuum can reduce allergen  levels. 6. Giving your dog or cat a bath at least once a week can reduce airborne allergen.  Control of Mold Allergen  Mold and fungi can grow on a variety of surfaces provided certain temperature and moisture conditions exist.  Outdoor molds grow on plants, decaying vegetation and soil.  The major outdoor mold, Alternaria and Cladosporium, are found in very high numbers during hot and dry conditions.  Generally, a late Summer - Fall peak is seen for common outdoor fungal spores.  Rain will temporarily lower outdoor mold spore count, but counts rise rapidly when the rainy period ends.  The most important indoor molds are Aspergillus and Penicillium.  Dark, humid and poorly ventilated basements are ideal sites for mold growth.  The next most common sites of mold growth are the bathroom and the kitchen.  Outdoor [employer] 2. Use air conditioning and keep windows closed 3. Avoid exposure to decaying vegetation. 4. Avoid leaf raking. 5. Avoid grain handling. 6. Consider wearing a face mask if working in moldy areas.  Indoor Mold Control 1. Maintain humidity below 50%. 2. Clean washable surfaces with 5% bleach solution. 3. Remove sources e.g. Contaminated carpets.

## 2018-01-20 NOTE — Telephone Encounter (Signed)
done

## 2018-01-21 ENCOUNTER — Other Ambulatory Visit: Payer: Self-pay | Admitting: Allergy and Immunology

## 2018-01-21 ENCOUNTER — Other Ambulatory Visit: Payer: Self-pay | Admitting: Neurology

## 2018-01-22 NOTE — Telephone Encounter (Signed)
LM for pt to returncall

## 2018-01-22 NOTE — Telephone Encounter (Signed)
Until carbinoxamine is no longer on back order, she will have to use guaifenesin (Mucinex).  For thick post nasal drainage, add guaifenesin 1200 mg (Mucinex Maximum Strength)  twice daily as needed with adequate hydration as discussed.  Thanks.

## 2018-02-09 ENCOUNTER — Encounter: Payer: Self-pay | Admitting: Gynecology

## 2018-02-09 ENCOUNTER — Inpatient Hospital Stay: Payer: 59 | Attending: Gynecology | Admitting: Gynecology

## 2018-02-09 VITALS — BP 113/82 | HR 92 | Temp 98.3°F | Resp 20 | Ht 66.0 in | Wt 157.1 lb

## 2018-02-09 DIAGNOSIS — R87613 High grade squamous intraepithelial lesion on cytologic smear of cervix (HGSIL): Secondary | ICD-10-CM

## 2018-02-09 DIAGNOSIS — D069 Carcinoma in situ of cervix, unspecified: Secondary | ICD-10-CM | POA: Diagnosis present

## 2018-02-09 DIAGNOSIS — C538 Malignant neoplasm of overlapping sites of cervix uteri: Secondary | ICD-10-CM | POA: Diagnosis not present

## 2018-02-09 NOTE — Addendum Note (Signed)
Addended by: [name] D on: 02/09/2018 10:09 AM   Modules accepted: Orders

## 2018-02-09 NOTE — Patient Instructions (Signed)
We will contact you with the results of your biopsy from today.  You may experience spotting after the biopsy.  Please call our office for any questions or concerns at [phone].   Cervical Biopsy, Care After Refer to this sheet in the next few weeks. These instructions provide you with information about caring for yourself after your procedure. Your health care provider may also give you more specific instructions. Your treatment has been planned according to current medical practices, but problems sometimes occur. Call your health care provider if you have any problems or questions after your procedure. What can I expect after the procedure? After the procedure, it is common to have:  Cramping or mild pain for a few days.  Slight bleeding from the vagina for a few days.  Dark-colored vaginal discharge for a few days.  Follow these instructions at home:  Take over-the-counter and prescription medicines only as told by your health care provider.  Return to your normal activities as told by your health care provider. Ask your health care provider what activities are safe for you.  Use a sanitary napkin until bleeding and discharge stop.  Do not use tampons until your health care provider approves.  Do not douche until your health care provider approves.  Do not have sex until your health care provider approves.  Keep all follow-up visits as told by your health care provider. This is important. Contact a health care provider if:  You have a fever or chills.  You have bad-smelling vaginal discharge.  You have itching or irritation around the vagina.  You have lower abdominal pain. Get help right away if:  You develop heavy vaginal bleeding that soaks more than one sanitary pad an hour.  You faint.  You have very bad lower abdominal pain. This information is not intended to replace advice given to you by your health care provider. Make sure you discuss any questions you  have with your health care provider. Document Released: 04/11/2015 Document Revised: 12/27/2015 Document Reviewed: 12/06/2014 Elsevier Interactive Patient Education  2018 [employer].

## 2018-02-09 NOTE — H&P (View-Only) (Signed)
Consult Note: Gyn-Onc   Kelly Mcclure 37 y.o. female  Chief Complaint  Patient presents with  . 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 Recent Pap smear showing HGSIL and negative endocervical curettage.  Colposcopy suspicious for an exophytic lesion which is friable (biopsies pending)  Plan: Biopsies are pending.  I am suspicious that she may have an early invasive lesion given the friable lesion in the exophytic nature of what I was able to visualize.  I indicated the patient and her mother that if these biopsies are inconclusive I would advise exam under anesthesia and additional biopsies before making recommendations regarding further treatment.  Patient indicated she would prefer to have a hysterectomy given that she has 1 living child and continued problems with HGSIL.  I indicated the patient and her mother that hysterectomy is reasonable as long as were certain she does not have an invasive cancer.   Interval History: Returns today for further further evaluation of persistent high-grade SIL Pap smears.  Records indicate the patient's had several HGSIL Pap smears the most recent one on January 13, 2018.  I last saw the patient in 2015.  Subsequently she had a baby by cesarean section approximately 8 months ago.  This required a cervical cerclage for the pregnancy.  The patient continues to have regular cyclic menses. She denies any intermenstrual spotting or pelvic pain.  HPI: Patient had an abnormal Pap smear in 2011. 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 Surgical margins were positive. Therefore, the patient underwent a cold knife conization on 04/01/2010. Pathology from that specimen showed severe dysplasia with negative margins. The patient was subsequently followed with Pap smears. A pap in March 2012 was ascus. ECC was negative. Repeat ECC in June  2012 was negative. Pap smear in 03/03/2012 showed high-grade SIL and again ECC was negative. Conservative followup was recommended.  Repeat colposcopy in December 2013 identified a lesion in the posterior vaginal fornix that was biopsied. Final pathology only showed atypia. A pap smear on that same day only showed LSIL.  A Pap smear on 10/01/2012 showed high-grade SIL.  Pap smear in March was low grade SIL. Patient subsequently underwent cervical dilatation, endocervical curettage, and vaginal biopsy (03/01/2013). The only abnormality found was atypia on vaginal biopsy.  October 2014 Pap smear with low-grade SIL.  March 21, 2016 Pap smear showed HGSIL.  Endocervical curettage showed benign endocervical glands.  Pap smear July 15, 2016 showed HGSIL.  Endocervical curettage showed benign endocervical glands.  January 13, 2018 Pap smear showed HGSIL.      Review of Systems:10 point review of systems is negative except as noted in interval history.   Vitals: Blood pressure 113/82, pulse 92, temperature 98.3 F (36.8 C), temperature source Oral, resp. rate 20, height '5\' 6"'  (1.676 m), weight 157 lb 1.6 oz (71.3 kg), SpO2 98 %, unknown if currently breastfeeding.  Physical Exam: General : The patient is a healthy woman in no acute distress.  HEENT: normocephalic, extraoccular movements normal; neck is supple without thyromegally  Lynphnodes: Supraclavicular and inguinal nodes not enlarged  Abdomen: Soft, non-tender, no ascites, no organomegally, no masses, no hernias  Pelvic:  EGBUS: Normal female  Vagina: Normal, no lesions  Urethra and Bladder: Normal, non-tender  Cervix: Flush with the vaginal fornices.  There is a small exophytic area that is friable. Uterus: Anterior normal shape size and  consistency Bi-manual examination: Non-tender; no adenxal masses or nodularity  Lower extremities: No edema or varicosities. Normal range of motion    Procedure note: Colposcopy is  performed after applying acetic acid.  In the course of applying acetic acid a friable area began to bleed.  Colposcopy was somewhat difficult because the narrowing of the upper vagina and the cervix being flush with the vaginal fornices.  Attempts at biopsies were obtained on 3 occasions.     Allergies  Allergen Reactions  . Sulfa Antibiotics Other (See Comments)    Tongue swelling, throat irritated  . Adhesive [Tape] Other (See Comments)    "skin burn"  . Other Swelling and Other (See Comments)    Magic mouth wash/ causes swelling of the tongue  . Relpax [Eletriptan Hydrobromide] Other (See Comments)    Stroke-like symptoms  . Ultram [Tramadol] Other (See Comments)    seizures    Past Medical History:  Diagnosis Date  . Abnormal liver enzymes   . Cancer (HCC)    hx of cervical cancer  . Complication of anesthesia    after some surgeries - pt woke up with migraine  . GERD (gastroesophageal reflux disease)    with pregnancy  . H/O radioactive iodine thyroid ablation 12/2009  . 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 History of kidney stones 2011  . Hx of varicella   . Hypothyroidism   . Migraines   . Mild persistent asthma 01/19/2018  . Seizure disorder ([place])    Diagnosed at age 12  . Seizures ([place])    clonic tonic seizures - will "space out " for a very brief moment.  take medication for seizures. HX OF MAJOR SEIZURES - LOOSING CONSCIOUSNESS-- APPROX 10 TO 12 YRS AGO- NEUROLOGIST IS DR. Raliegh Ip. [place], absent seizures  . 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 Vaginal Pap smear, abnormal     Past Surgical History:  Procedure Laterality Date  . ABDOMINAL CERCLAGE N/A 11/19/2016   Procedure: LAPAROSCOPIC TRANSABDOMINAL CERVICAL-ISTHMUSx2;  Surgeon: Governor Specking, MD;  Location: [facility];  Service: Gynecology;  Laterality: N/A;  with ultrasound guidance. Start laparoscopically HOLD OPEN ABDOMINAL ITEMS   . ADENOIDECTOMY   1995  . CERCLAGE LAPAROSCOPIC ABDOMINAL N/A 11/19/2016   Procedure: CERCLAGE LAPAROSCOPIC ABDOMINAL;  Surgeon: Governor Specking, MD;  Location: [facility];  Service: Gynecology;  Laterality: N/A;  . CESAREAN SECTION N/A 05/27/2017   Procedure: Primary CESAREAN SECTION;  Surgeon: Brien Few, MD;  Location: [facility];  Service: Obstetrics;  Laterality: N/A;  EDD: 06/07/17 Allergy: Adhesive, Relpax, Sulfa, Ultram  . cold knife conization    . DILATION AND CURETTAGE OF UTERUS  2009  . DILATION AND CURETTAGE OF UTERUS N/A 03/01/2013   Procedure: CERVICAL DILATATION AND ENDOCERVICAL CURRETAGE AND VAAGINAL BIOPIES;  Surgeon: Alvino Chapel, MD;  Location: [facility];  Service: Gynecology;  Laterality: N/A;  . LEEP  02/25/2010  . LEEP  07/25/11  . MOUTH SURGERY    . NASAL SEPTUM SURGERY  2006   Repair of deviated septum  . SIGMOIDOSCOPY    . TONSILLECTOMY  1995    Current Outpatient Medications  Medication Sig Dispense Refill  . albuterol (PROVENTIL HFA;VENTOLIN HFA) 108 (90 Base) MCG/ACT inhaler Inhale 2 puffs into the lungs every 6 (six) hours as needed for wheezing or shortness of breath. 1 Inhaler 1  . ALPRAZolam (XANAX) 0.5 MG tablet TAKE 1 TABLET (0.5 MG TOTAL) BY MOUTH AT  BEDTIME AS NEEDED FOR ANXIETY. 30 tablet 0  . azelastine (ASTELIN) 0.1 % nasal spray Place 2 sprays into both nostrils 2 (two) times daily. 30 mL 1  . busPIRone (BUSPAR) 5 MG tablet Take 5 mg by mouth 2 (two) times daily.  0  . butalbital-acetaminophen-caffeine (FIORICET, ESGIC) 50-325-40 MG tablet Take 1 tablet by mouth every 4 (four) hours as needed for headache.    . Carbinoxamine Maleate 4 MG TABS TAKE 1 TABLET BY MOUTH EVERY 6 HOURS AS NEEDED (MFG BACK ORDER) 28 tablet 1  . FLUoxetine (PROZAC) 40 MG capsule Take 40 mg by mouth daily.  0  . fluticasone (FLOVENT HFA) 110 MCG/ACT inhaler Inhale 2 puffs into the lungs 2 (two) times daily. 12 g 1  . folic acid (FOLVITE) 1 MG tablet Take 4 mg by mouth daily.  3   . ipratropium (ATROVENT) 0.06 % nasal spray Place 1 spray into both nostrils 3 (three) times daily.    . LamoTRIgine (LAMICTAL XR) 200 MG TB24 24 hour tablet Take 2 tablets (400 mg total) by mouth 2 (two) times daily. 360 tablet 4  . LamoTRIgine 100 MG TB24 24 hour tablet Take 1 tablet (100 mg total) by mouth at bedtime. 30 tablet 11  . levETIRAcetam (KEPPRA) 750 MG tablet Take 2 tablets (1,500 mg total) by mouth 2 (two) times daily. 360 tablet 4  . loratadine (CLARITIN) 10 MG tablet Take 10 mg by mouth daily as needed for allergies.    . montelukast (SINGULAIR) 10 MG tablet Take 1 tablet (10 mg total) by mouth at bedtime. 30 tablet 1  . SYNTHROID 150 MCG tablet Take 1 tablet (150 mcg total) by mouth daily before breakfast. 90 tablet 3  . loratadine (CLARITIN) 10 MG tablet Take 1 tablet (10 mg total) by mouth daily. 90 tablet 1   No current facility-administered medications for this visit.     Social History   Socioeconomic History  . Marital status: Single    Spouse name: Not on file  . Number of children: Not on file  . Years of education: Not on file  . Highest education level: Not on file  Occupational History  . Not on file  Social Needs  . Financial resource strain: Not on file  . Food insecurity:    Worry: Not on file    Inability: Not on file  . Transportation needs:    Medical: Not on file    Non-medical: Not on file  Tobacco Use  . Smoking status: Never Smoker  . Smokeless tobacco: Never Used  Substance and Sexual Activity  . Alcohol use: No    Comment: Rare  . Drug use: No  . Sexual activity: Yes  Lifestyle  . Physical activity:    Days per week: Not on file    Minutes per session: Not on file  . Stress: Not on file  Relationships  . Social connections:    Talks on phone: Not on file    Gets together: Not on file    Attends religious service: Not on file    Active member of club or organization: Not on file    Attends meetings of clubs or organizations:  Not on file    Relationship status: Not on file  . Intimate partner violence:    Fear of current or ex partner: Not on file    Emotionally abused: Not on file    Physically abused: Not on file    Forced sexual activity: Not  on file  Other Topics Concern  . Not on file  Social History Narrative  . Not on file    Family History  Problem Relation Age of Onset  . Leukemia Other   . Diabetes Other   . Diabetes Mother   . Skin cancer Mother   . Hyperlipidemia Mother   . Hypertension Father   . Asthma Father   . Skin cancer Maternal Aunt   . Multiple myeloma Maternal Aunt   . Testicular cancer Maternal Uncle   . Hyperlipidemia Maternal Grandmother   . AAA (abdominal aortic aneurysm) Maternal Grandmother   . COPD Maternal Grandmother   . Prostate cancer Maternal Grandfather   . AAA (abdominal aortic aneurysm) Maternal Grandfather       Marti Sleigh, MD 02/09/2018, 9:50 AM      Consult Note: Gyn-Onc

## 2018-02-09 NOTE — Progress Notes (Signed)
Consult Note: Gyn-Onc   Kelly Mcclure 37 y.o. female  Chief Complaint  Patient presents with  . 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 Recent Pap smear showing HGSIL and negative endocervical curettage.  Colposcopy suspicious for an exophytic lesion which is friable (biopsies pending)  Plan: Biopsies are pending.  I am suspicious that she may have an early invasive lesion given the friable lesion in the exophytic nature of what I was able to visualize.  I indicated the patient and her mother that if these biopsies are inconclusive I would advise exam under anesthesia and additional biopsies before making recommendations regarding further treatment.  Patient indicated she would prefer to have a hysterectomy given that she has 1 living child and continued problems with HGSIL.  I indicated the patient and her mother that hysterectomy is reasonable as long as were certain she does not have an invasive cancer.   Interval History: Returns today for further further evaluation of persistent high-grade SIL Pap smears.  Records indicate the patient's had several HGSIL Pap smears the most recent one on January 13, 2018.  I last saw the patient in 2015.  Subsequently she had a baby by cesarean section approximately 8 months ago.  This required a cervical cerclage for the pregnancy.  The patient continues to have regular cyclic menses. She denies any intermenstrual spotting or pelvic pain.  HPI: Patient had an abnormal Pap smear in 2011. 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 Surgical margins were positive. Therefore, the patient underwent a cold knife conization on 04/01/2010. Pathology from that specimen showed severe dysplasia with negative margins. The patient was subsequently followed with Pap smears. A pap in March 2012 was ascus. ECC was negative. Repeat ECC in June  2012 was negative. Pap smear in 03/03/2012 showed high-grade SIL and again ECC was negative. Conservative followup was recommended.  Repeat colposcopy in December 2013 identified a lesion in the posterior vaginal fornix that was biopsied. Final pathology only showed atypia. A pap smear on that same day only showed LSIL.  A Pap smear on 10/01/2012 showed high-grade SIL.  Pap smear in March was low grade SIL. Patient subsequently underwent cervical dilatation, endocervical curettage, and vaginal biopsy (03/01/2013). The only abnormality found was atypia on vaginal biopsy.  October 2014 Pap smear with low-grade SIL.  March 21, 2016 Pap smear showed HGSIL.  Endocervical curettage showed benign endocervical glands.  Pap smear July 15, 2016 showed HGSIL.  Endocervical curettage showed benign endocervical glands.  January 13, 2018 Pap smear showed HGSIL.      Review of Systems:10 point review of systems is negative except as noted in interval history.   Vitals: Blood pressure 113/82, pulse 92, temperature 98.3 F (36.8 C), temperature source Oral, resp. rate 20, height '5\' 6"'  (1.676 m), weight 157 lb 1.6 oz (71.3 kg), SpO2 98 %, unknown if currently breastfeeding.  Physical Exam: General : The patient is a healthy woman in no acute distress.  HEENT: normocephalic, extraoccular movements normal; neck is supple without thyromegally  Lynphnodes: Supraclavicular and inguinal nodes not enlarged  Abdomen: Soft, non-tender, no ascites, no organomegally, no masses, no hernias  Pelvic:  EGBUS: Normal female  Vagina: Normal, no lesions  Urethra and Bladder: Normal, non-tender  Cervix: Flush with the vaginal fornices.  There is a small exophytic area that is friable. Uterus: Anterior normal shape size and  consistency Bi-manual examination: Non-tender; no adenxal masses or nodularity  Lower extremities: No edema or varicosities. Normal range of motion    Procedure note: Colposcopy is  performed after applying acetic acid.  In the course of applying acetic acid a friable area began to bleed.  Colposcopy was somewhat difficult because the narrowing of the upper vagina and the cervix being flush with the vaginal fornices.  Attempts at biopsies were obtained on 3 occasions.     Allergies  Allergen Reactions  . Sulfa Antibiotics Other (See Comments)    Tongue swelling, throat irritated  . Adhesive [Tape] Other (See Comments)    "skin burn"  . Other Swelling and Other (See Comments)    Magic mouth wash/ causes swelling of the tongue  . Relpax [Eletriptan Hydrobromide] Other (See Comments)    Stroke-like symptoms  . Ultram [Tramadol] Other (See Comments)    seizures    Past Medical History:  Diagnosis Date  . Abnormal liver enzymes   . Cancer (HCC)    hx of cervical cancer  . Complication of anesthesia    after some surgeries - pt woke up with migraine  . GERD (gastroesophageal reflux disease)    with pregnancy  . H/O radioactive iodine thyroid ablation 12/2009  . 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 History of kidney stones 2011  . Hx of varicella   . Hypothyroidism   . Migraines   . Mild persistent asthma 01/19/2018  . Seizure disorder ([place])    Diagnosed at age 43  . Seizures ([place])    clonic tonic seizures - will "space out " for a very brief moment.  take medication for seizures. HX OF MAJOR SEIZURES - LOOSING CONSCIOUSNESS-- APPROX 10 TO 12 YRS AGO- NEUROLOGIST IS DR. Raliegh Ip. [place]ILLER IN [place], absent seizures  . 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 Vaginal Pap smear, abnormal     Past Surgical History:  Procedure Laterality Date  . ABDOMINAL CERCLAGE N/A 11/19/2016   Procedure: LAPAROSCOPIC TRANSABDOMINAL CERVICAL-ISTHMUSx2;  Surgeon: Governor Specking, MD;  Location: [facility];  Service: Gynecology;  Laterality: N/A;  with ultrasound guidance. Start laparoscopically HOLD OPEN ABDOMINAL ITEMS   . ADENOIDECTOMY   1995  . CERCLAGE LAPAROSCOPIC ABDOMINAL N/A 11/19/2016   Procedure: CERCLAGE LAPAROSCOPIC ABDOMINAL;  Surgeon: Governor Specking, MD;  Location: [facility];  Service: Gynecology;  Laterality: N/A;  . CESAREAN SECTION N/A 05/27/2017   Procedure: Primary CESAREAN SECTION;  Surgeon: Brien Few, MD;  Location: [facility];  Service: Obstetrics;  Laterality: N/A;  EDD: 06/07/17 Allergy: Adhesive, Relpax, Sulfa, Ultram  . cold knife conization    . DILATION AND CURETTAGE OF UTERUS  2009  . DILATION AND CURETTAGE OF UTERUS N/A 03/01/2013   Procedure: CERVICAL DILATATION AND ENDOCERVICAL CURRETAGE AND VAAGINAL BIOPIES;  Surgeon: Alvino Chapel, MD;  Location: [facility];  Service: Gynecology;  Laterality: N/A;  . LEEP  02/25/2010  . LEEP  07/25/11  . MOUTH SURGERY    . NASAL SEPTUM SURGERY  2006   Repair of deviated septum  . SIGMOIDOSCOPY    . TONSILLECTOMY  1995    Current Outpatient Medications  Medication Sig Dispense Refill  . albuterol (PROVENTIL HFA;VENTOLIN HFA) 108 (90 Base) MCG/ACT inhaler Inhale 2 puffs into the lungs every 6 (six) hours as needed for wheezing or shortness of breath. 1 Inhaler 1  . ALPRAZolam (XANAX) 0.5 MG tablet TAKE 1 TABLET (0.5 MG TOTAL) BY MOUTH AT  BEDTIME AS NEEDED FOR ANXIETY. 30 tablet 0  . azelastine (ASTELIN) 0.1 % nasal spray Place 2 sprays into both nostrils 2 (two) times daily. 30 mL 1  . busPIRone (BUSPAR) 5 MG tablet Take 5 mg by mouth 2 (two) times daily.  0  . butalbital-acetaminophen-caffeine (FIORICET, ESGIC) 50-325-40 MG tablet Take 1 tablet by mouth every 4 (four) hours as needed for headache.    . Carbinoxamine Maleate 4 MG TABS TAKE 1 TABLET BY MOUTH EVERY 6 HOURS AS NEEDED (MFG BACK ORDER) 28 tablet 1  . FLUoxetine (PROZAC) 40 MG capsule Take 40 mg by mouth daily.  0  . fluticasone (FLOVENT HFA) 110 MCG/ACT inhaler Inhale 2 puffs into the lungs 2 (two) times daily. 12 g 1  . folic acid (FOLVITE) 1 MG tablet Take 4 mg by mouth daily.  3   . ipratropium (ATROVENT) 0.06 % nasal spray Place 1 spray into both nostrils 3 (three) times daily.    . LamoTRIgine (LAMICTAL XR) 200 MG TB24 24 hour tablet Take 2 tablets (400 mg total) by mouth 2 (two) times daily. 360 tablet 4  . LamoTRIgine 100 MG TB24 24 hour tablet Take 1 tablet (100 mg total) by mouth at bedtime. 30 tablet 11  . levETIRAcetam (KEPPRA) 750 MG tablet Take 2 tablets (1,500 mg total) by mouth 2 (two) times daily. 360 tablet 4  . loratadine (CLARITIN) 10 MG tablet Take 10 mg by mouth daily as needed for allergies.    . montelukast (SINGULAIR) 10 MG tablet Take 1 tablet (10 mg total) by mouth at bedtime. 30 tablet 1  . SYNTHROID 150 MCG tablet Take 1 tablet (150 mcg total) by mouth daily before breakfast. 90 tablet 3  . loratadine (CLARITIN) 10 MG tablet Take 1 tablet (10 mg total) by mouth daily. 90 tablet 1   No current facility-administered medications for this visit.     Social History   Socioeconomic History  . Marital status: Single    Spouse name: Not on file  . Number of children: Not on file  . Years of education: Not on file  . Highest education level: Not on file  Occupational History  . Not on file  Social Needs  . Financial resource strain: Not on file  . Food insecurity:    Worry: Not on file    Inability: Not on file  . Transportation needs:    Medical: Not on file    Non-medical: Not on file  Tobacco Use  . Smoking status: Never Smoker  . Smokeless tobacco: Never Used  Substance and Sexual Activity  . Alcohol use: No    Comment: Rare  . Drug use: No  . Sexual activity: Yes  Lifestyle  . Physical activity:    Days per week: Not on file    Minutes per session: Not on file  . Stress: Not on file  Relationships  . Social connections:    Talks on phone: Not on file    Gets together: Not on file    Attends religious service: Not on file    Active member of club or organization: Not on file    Attends meetings of clubs or organizations:  Not on file    Relationship status: Not on file  . Intimate partner violence:    Fear of current or ex partner: Not on file    Emotionally abused: Not on file    Physically abused: Not on file    Forced sexual activity: Not  on file  Other Topics Concern  . Not on file  Social History Narrative  . Not on file    Family History  Problem Relation Age of Onset  . Leukemia Other   . Diabetes Other   . Diabetes Mother   . Skin cancer Mother   . Hyperlipidemia Mother   . Hypertension Father   . Asthma Father   . Skin cancer Maternal Aunt   . Multiple myeloma Maternal Aunt   . Testicular cancer Maternal Uncle   . Hyperlipidemia Maternal Grandmother   . AAA (abdominal aortic aneurysm) Maternal Grandmother   . COPD Maternal Grandmother   . Prostate cancer Maternal Grandfather   . AAA (abdominal aortic aneurysm) Maternal Grandfather       Marti Sleigh, MD 02/09/2018, 9:50 AM      Consult Note: Gyn-Onc

## 2018-02-11 ENCOUNTER — Telehealth: Payer: Self-pay | Admitting: Gynecologic Oncology

## 2018-02-11 ENCOUNTER — Other Ambulatory Visit: Payer: Self-pay | Admitting: Gynecologic Oncology

## 2018-02-11 NOTE — Telephone Encounter (Signed)
Left message for patient asking her to please call the office.  Patient called from work number and returned call.  Advised of Dr. Mora Bellman recommendation for a CKC or deeper biopsy in the OR.  Since he is not available for the next several weeks, he would like the patient to have the procedure with one of his partners to prevent delays.  Advised she would be contacted back with information about her upcoming procedure.

## 2018-02-11 NOTE — Telephone Encounter (Signed)
Spoke with the patient about her upcoming conization of the cervix on Thurs, July 18.  Advised she would be contacted by the pre-surgical RN to discuss instructions, etc.  No concerns voiced.

## 2018-02-15 ENCOUNTER — Encounter (HOSPITAL_BASED_OUTPATIENT_CLINIC_OR_DEPARTMENT_OTHER): Payer: Self-pay

## 2018-02-16 ENCOUNTER — Telehealth: Payer: Self-pay | Admitting: Gynecologic Oncology

## 2018-02-16 ENCOUNTER — Other Ambulatory Visit: Payer: Self-pay

## 2018-02-16 ENCOUNTER — Encounter (HOSPITAL_BASED_OUTPATIENT_CLINIC_OR_DEPARTMENT_OTHER): Payer: Self-pay | Admitting: *Deleted

## 2018-02-16 NOTE — Progress Notes (Signed)
Spoke w/ pt via phone for pre-op interview.  Npo after mn.  Arrive at 0930.  Needs upt.  Will take am meds w/ sips of water dos.

## 2018-02-16 NOTE — Telephone Encounter (Signed)
Called patient to confirm that she is not pregnant.  Per office note from neurologist in May 2019, pt was noted to be [redacted] weeks pregnant.  Patient states she is not pregnant and her daughter is almost 1 months old.

## 2018-02-18 ENCOUNTER — Ambulatory Visit (HOSPITAL_BASED_OUTPATIENT_CLINIC_OR_DEPARTMENT_OTHER): Payer: 59 | Admitting: Anesthesiology

## 2018-02-18 ENCOUNTER — Telehealth: Payer: Self-pay | Admitting: *Deleted

## 2018-02-18 ENCOUNTER — Ambulatory Visit (HOSPITAL_BASED_OUTPATIENT_CLINIC_OR_DEPARTMENT_OTHER)
Admission: RE | Admit: 2018-02-18 | Discharge: 2018-02-18 | Disposition: A | Discharge status: Home / Self Care | Payer: 59 | Source: Ambulatory Visit | Attending: Gynecologic Oncology | Admitting: Gynecologic Oncology

## 2018-02-18 ENCOUNTER — Encounter (HOSPITAL_BASED_OUTPATIENT_CLINIC_OR_DEPARTMENT_OTHER)
Admission: RE | Disposition: A | Discharge status: Home / Self Care | Payer: Self-pay | Source: Ambulatory Visit | Attending: Gynecologic Oncology

## 2018-02-18 ENCOUNTER — Encounter (HOSPITAL_BASED_OUTPATIENT_CLINIC_OR_DEPARTMENT_OTHER): Payer: Self-pay | Admitting: Anesthesiology

## 2018-02-18 DIAGNOSIS — Z882 Allergy status to sulfonamides status: Secondary | ICD-10-CM | POA: Insufficient documentation

## 2018-02-18 DIAGNOSIS — G40909 Epilepsy, unspecified, not intractable, without status epilepticus: Secondary | ICD-10-CM | POA: Insufficient documentation

## 2018-02-18 DIAGNOSIS — D069 Carcinoma in situ of cervix, unspecified: Secondary | ICD-10-CM | POA: Diagnosis present

## 2018-02-18 DIAGNOSIS — Z888 Allergy status to other drugs, medicaments and biological substances status: Secondary | ICD-10-CM | POA: Insufficient documentation

## 2018-02-18 DIAGNOSIS — Z885 Allergy status to narcotic agent status: Secondary | ICD-10-CM | POA: Insufficient documentation

## 2018-02-18 DIAGNOSIS — E039 Hypothyroidism, unspecified: Secondary | ICD-10-CM | POA: Diagnosis not present

## 2018-02-18 DIAGNOSIS — F329 Major depressive disorder, single episode, unspecified: Secondary | ICD-10-CM | POA: Insufficient documentation

## 2018-02-18 DIAGNOSIS — J4530 Mild persistent asthma, uncomplicated: Secondary | ICD-10-CM | POA: Diagnosis not present

## 2018-02-18 DIAGNOSIS — C538 Malignant neoplasm of overlapping sites of cervix uteri: Secondary | ICD-10-CM | POA: Diagnosis not present

## 2018-02-18 DIAGNOSIS — C539 Malignant neoplasm of cervix uteri, unspecified: Secondary | ICD-10-CM

## 2018-02-18 DIAGNOSIS — C52 Malignant neoplasm of vagina: Secondary | ICD-10-CM | POA: Diagnosis present

## 2018-02-18 DIAGNOSIS — Z79899 Other long term (current) drug therapy: Secondary | ICD-10-CM | POA: Insufficient documentation

## 2018-02-18 HISTORY — DX: Personal history of other endocrine, nutritional and metabolic disease: Z86.39

## 2018-02-18 HISTORY — DX: Postprocedural hypothyroidism: E89.0

## 2018-02-18 HISTORY — PX: CERVICAL CONIZATION W/BX: SHX1330

## 2018-02-18 HISTORY — DX: Personal history of cervical dysplasia: Z87.410

## 2018-02-18 LAB — POCT PREGNANCY, URINE: PREG TEST UR: NEGATIVE

## 2018-02-18 SURGERY — CONE BIOPSY, CERVIX
Anesthesia: General

## 2018-02-18 MED ORDER — KETOROLAC TROMETHAMINE 30 MG/ML IJ SOLN
INTRAMUSCULAR | Status: DC | PRN
  Administered 2018-02-18: 30 mg via INTRAVENOUS

## 2018-02-18 MED ORDER — ONDANSETRON HCL 4 MG/2ML IJ SOLN
4.0000 mg | Freq: Once | INTRAMUSCULAR | Status: AC | PRN
  Administered 2018-02-18: 4 mg via INTRAVENOUS
  Filled 2018-02-18: qty 2

## 2018-02-18 MED ORDER — PROPOFOL 10 MG/ML IV BOLUS
INTRAVENOUS | Status: DC | PRN
  Administered 2018-02-18: 150 mg via INTRAVENOUS

## 2018-02-18 MED ORDER — OXYCODONE-ACETAMINOPHEN 5-325 MG PO TABS: 1.0000 | ORAL_TABLET | ORAL | 0 refills | Status: DC | PRN

## 2018-02-18 MED ORDER — ACETAMINOPHEN 160 MG/5ML PO SOLN
325.0000 mg | ORAL | Status: DC | PRN
  Filled 2018-02-18: qty 20.3

## 2018-02-18 MED ORDER — ACETAMINOPHEN 325 MG PO TABS
325.0000 mg | ORAL_TABLET | ORAL | Status: DC | PRN
  Filled 2018-02-18: qty 2

## 2018-02-18 MED ORDER — MIDAZOLAM HCL 5 MG/5ML IJ SOLN
INTRAMUSCULAR | Status: DC | PRN
  Administered 2018-02-18: 2 mg via INTRAVENOUS

## 2018-02-18 MED ORDER — KETOROLAC TROMETHAMINE 30 MG/ML IJ SOLN
INTRAMUSCULAR | Status: AC
  Filled 2018-02-18: qty 1

## 2018-02-18 MED ORDER — SENNA 8.6 MG PO TABS: 1.0000 | ORAL_TABLET | Freq: Every day | ORAL | 0 refills | Status: DC

## 2018-02-18 MED ORDER — FENTANYL CITRATE (PF) 100 MCG/2ML IJ SOLN
INTRAMUSCULAR | Status: DC | PRN
  Administered 2018-02-18: 50 ug via INTRAVENOUS

## 2018-02-18 MED ORDER — MEPERIDINE HCL 25 MG/ML IJ SOLN
6.2500 mg | INTRAMUSCULAR | Status: DC | PRN
  Filled 2018-02-18: qty 1

## 2018-02-18 MED ORDER — MIDAZOLAM HCL 2 MG/2ML IJ SOLN
INTRAMUSCULAR | Status: AC
  Filled 2018-02-18: qty 2

## 2018-02-18 MED ORDER — FENTANYL CITRATE (PF) 100 MCG/2ML IJ SOLN
INTRAMUSCULAR | Status: AC
  Filled 2018-02-18: qty 2

## 2018-02-18 MED ORDER — ACETIC ACID 5 % SOLN
Status: DC | PRN
  Administered 2018-02-18: 1 via TOPICAL

## 2018-02-18 MED ORDER — ONDANSETRON HCL 4 MG/2ML IJ SOLN
INTRAMUSCULAR | Status: AC
  Filled 2018-02-18: qty 2

## 2018-02-18 MED ORDER — OXYCODONE HCL 5 MG/5ML PO SOLN
5.0000 mg | Freq: Once | ORAL | Status: DC | PRN
  Filled 2018-02-18: qty 5

## 2018-02-18 MED ORDER — OXYCODONE HCL 5 MG PO TABS
5.0000 mg | ORAL_TABLET | Freq: Once | ORAL | Status: DC | PRN
  Filled 2018-02-18: qty 1

## 2018-02-18 MED ORDER — DEXAMETHASONE SODIUM PHOSPHATE 10 MG/ML IJ SOLN
INTRAMUSCULAR | Status: AC
  Filled 2018-02-18: qty 1

## 2018-02-18 MED ORDER — LACTATED RINGERS IV SOLN
INTRAVENOUS | Status: DC
  Administered 2018-02-18 (×2): via INTRAVENOUS
  Filled 2018-02-18: qty 1000

## 2018-02-18 MED ORDER — LIDOCAINE 2% (20 MG/ML) 5 ML SYRINGE
INTRAMUSCULAR | Status: DC | PRN
  Administered 2018-02-18: 100 mg via INTRAVENOUS

## 2018-02-18 MED ORDER — LIDOCAINE 2% (20 MG/ML) 5 ML SYRINGE
INTRAMUSCULAR | Status: AC
  Filled 2018-02-18: qty 5

## 2018-02-18 MED ORDER — PROPOFOL 10 MG/ML IV BOLUS
INTRAVENOUS | Status: AC
  Filled 2018-02-18: qty 20

## 2018-02-18 MED ORDER — DEXAMETHASONE SODIUM PHOSPHATE 4 MG/ML IJ SOLN
INTRAMUSCULAR | Status: DC | PRN
  Administered 2018-02-18: 10 mg via INTRAVENOUS

## 2018-02-18 MED ORDER — LIDOCAINE-EPINEPHRINE 1 %-1:100000 IJ SOLN
INTRAMUSCULAR | Status: DC | PRN
  Administered 2018-02-18: 10 mL

## 2018-02-18 MED ORDER — FENTANYL CITRATE (PF) 100 MCG/2ML IJ SOLN
25.0000 ug | INTRAMUSCULAR | Status: DC | PRN
  Filled 2018-02-18: qty 1

## 2018-02-18 SURGICAL SUPPLY — 24 items
APPLICATOR COTTON TIP 6IN STRL (MISCELLANEOUS) ×2 IMPLANT
BLADE EXTENDED COATED 6.5IN (ELECTRODE) ×2 IMPLANT
BLADE SURG 11 STRL SS (BLADE) ×2 IMPLANT
CANISTER SUCT 3000ML PPV (MISCELLANEOUS) IMPLANT
CATH ROBINSON RED A/P 16FR (CATHETERS) ×2 IMPLANT
DILATOR CANAL MILEX (MISCELLANEOUS) ×1 IMPLANT
DRSG TELFA 3X8 NADH (GAUZE/BANDAGES/DRESSINGS) ×2 IMPLANT
ELECT BALL LEEP 5MM RED (ELECTRODE) IMPLANT
GLOVE BIO SURGEON STRL SZ 6 (GLOVE) ×2 IMPLANT
GOWN STRL REUS W/ TWL LRG LVL3 (GOWN DISPOSABLE) ×1 IMPLANT
GOWN STRL REUS W/TWL LRG LVL3 (GOWN DISPOSABLE) ×2
KIT TURNOVER CYSTO (KITS) ×2 IMPLANT
NS IRRIG 500ML POUR BTL (IV SOLUTION) ×2 IMPLANT
PACK VAGINAL WOMENS (CUSTOM PROCEDURE TRAY) ×2 IMPLANT
PAD PREP 24X48 CUFFED NSTRL (MISCELLANEOUS) ×2 IMPLANT
SPONGE SURGIFOAM ABS GEL 12-7 (HEMOSTASIS) IMPLANT
SUT VIC AB 0 CT1 36 (SUTURE) ×2 IMPLANT
SUT VIC AB 2-0 CT2 27 (SUTURE) IMPLANT
SUT VIC AB 2-0 UR6 27 (SUTURE) IMPLANT
SUT VICRYL 0 UR6 27IN ABS (SUTURE) ×4 IMPLANT
SWAB OB GYN 8IN STERILE 2PK (MISCELLANEOUS) ×2 IMPLANT
SYR BULB IRRIGATION 50ML (SYRINGE) ×2 IMPLANT
TUBE CONNECTING 12X1/4 (SUCTIONS) ×2 IMPLANT
VACUUM HOSE/TUBING 7/8INX6FT (MISCELLANEOUS) IMPLANT

## 2018-02-18 NOTE — Anesthesia Preprocedure Evaluation (Signed)
Anesthesia Evaluation  Patient identified by MRN, date of birth, ID band Patient awake    Reviewed: Allergy & Precautions, H&P , NPO status , Patient's Chart, lab work & pertinent test results, reviewed documented beta blocker date and time   Airway Mallampati: II  TM Distance: >3 FB Neck ROM: full    Dental no notable dental hx.    Pulmonary neg pulmonary ROS, asthma ,    Pulmonary exam normal breath sounds clear to auscultation       Cardiovascular Exercise Tolerance: Good negative cardio ROS   Rhythm:regular Rate:Normal     Neuro/Psych  Headaches, Seizures -, Well Controlled,  PSYCHIATRIC DISORDERS Depression negative neurological ROS  negative psych ROS   GI/Hepatic negative GI ROS, Neg liver ROS,   Endo/Other  negative endocrine ROSHypothyroidism   Renal/GU negative Renal ROS  negative genitourinary   Musculoskeletal   Abdominal   Peds  Hematology negative hematology ROS (+)   Anesthesia Other Findings   Reproductive/Obstetrics negative OB ROS                             Anesthesia Physical Anesthesia Plan  ASA: II  Anesthesia Plan: General   Post-op Pain Management:    Induction: Intravenous  PONV Risk Score and Plan: 3 and Ondansetron, Dexamethasone, Treatment may vary due to age or medical condition and Midazolam  Airway Management Planned: LMA  Additional Equipment:   Intra-op Plan:   Post-operative Plan: Extubation in OR  Informed Consent: I have reviewed the patients History and Physical, chart, labs and discussed the procedure including the risks, benefits and alternatives for the proposed anesthesia with the patient or authorized representative who has indicated his/her understanding and acceptance.   Dental Advisory Given  Plan Discussed with: CRNA, Anesthesiologist and Surgeon  Anesthesia Plan Comments: ( )        Anesthesia Quick Evaluation

## 2018-02-18 NOTE — Transfer of Care (Signed)
  Last Vitals:  Vitals Value Taken Time  BP    Temp    Pulse    Resp    SpO2      Last Pain:  Vitals:   02/18/18 0932  TempSrc:   PainSc: 0-No pain      Patients Stated Pain Goal: 5 (02/18/18 0932)  Immediate Anesthesia Transfer of Care Note  Patient: Kelly Mcclure  Procedure(s) Performed: Procedure(s) (LRB): COLD KNIFE CONIZATION OF CERVIX WITH POSSIBLE BIOPSY (N/A)  Patient Location: PACU  Anesthesia Type: General  Level of Consciousness: awake, alert  and oriented  Airway & Oxygen Therapy: Patient Spontanous Breathing and Patient connected to nasal cannula oxygen  Post-op Assessment: Report given to PACU RN and Post -op Vital signs reviewed and stable  Post vital signs: Reviewed and stable  Complications: No apparent anesthesia complications

## 2018-02-18 NOTE — Anesthesia Procedure Notes (Signed)
Procedure Name: LMA Insertion Date/Time: 02/18/2018 11:46 AM Performed by: Janeece Riggers, MD Pre-anesthesia Checklist: Patient identified, Emergency Drugs available, Suction available and Patient being monitored Patient Re-evaluated:Patient Re-evaluated prior to induction Oxygen Delivery Method: Circle system utilized Preoxygenation: Pre-oxygenation with 100% oxygen Induction Type: IV induction Ventilation: Mask ventilation without difficulty LMA: LMA inserted LMA Size: 4.0 Number of attempts: 1 Airway Equipment and Method: Bite block Placement Confirmation: positive ETCO2 Tube secured with: Tape Dental Injury: Teeth and Oropharynx as per pre-operative assessment

## 2018-02-18 NOTE — Discharge Instructions (Signed)
Cold Knife Conization, Care After ACTIVITY  Rest as much as possible the first two days after discharge.  You do not have weight restrictions on lifting  Avoid strenuous working out such as running or lifting weights for 48-72 hours  You can climb stairs and drive a car NUTRITION  You may resume your normal diet.  Drink 6 to 8 glasses of fluids a day.  Eat a healthy, balanced diet including portions of food from the meat (protein), milk, fruit, vegetable, and bread groups.  Your caregiver may recommend you take a multivitamin with iron.  Medications:  - Take ibuprofen and tylenol first line for pain control. Take these regularly (every 6 hours) to decrease the build up of pain. Next dose Ibuprofen after 6 pm this evening.  - If necessary, for severe pain not relieved by ibuprofen, take percocet.  - While taking percocet you should take sennakot every night to reduce the likelihood of constipation. If this causes diarrhea, stop its use.  ELIMINATION   If constipation occurs, drink more liquids, and add more fruits, vegetables, and bran to your diet. You may take a mild laxative, such as Milk of Magnesia, Metamucil, or a stool softener such as Colace, with permission from your caregiver.  HYGIENE  You may shower and wash your hair.  Avoid tub baths for 4 weeks  Do not add any bath oils or chemicals to your bath water, after you have permission to take baths.  Avoid placing anything in the vagina for 4 weeks.  It is normal to pass a brown-flecked discharge from the vagina for several weeks after your procedure.  HOME CARE INSTRUCTIONS   Take your temperature twice a day and record it, especially if you feel feverish or have chills.  Follow your caregiver's instructions about medicines, activity, and follow-up appointments after surgery.  Do not drink alcohol while taking pain medicine.  You may take over-the-counter medicine for pain, recommended by your  caregiver.  If your pain is not relieved with medicine, call your caregiver.  Do not take aspirin because it can cause bleeding.  Do not douche or use tampons (use a nonperfumed sanitary pad).  Do not have sexual intercourse for 6 weeks postoperatively. Hugging, kissing, and playful sexual activity is fine with your caregiver's permission.  Take showers instead of baths, until your caregiver gives you permission to take baths.  You may take a mild medicine for constipation, recommended by your caregiver. Bran foods and drinking a lot of fluids will help with constipation.  Make sure your family understands everything about your operation and recovery.  SEEK MEDICAL CARE IF:    You notice a foul smell coming from the vagina.  You have painful or bloody urination.  You develop nausea and vomiting.  You develop diarrhea.  You develop a rash.  You have a reaction or allergy from the medicine.  You feel dizzy or light-headed.  You need stronger pain medicine.   SEEK IMMEDIATE MEDICAL CARE IF:   You develop a temperature of 102 F (38.9 C) or higher.  You pass out.  You develop leg or chest pain.  You develop abdominal pain.  You develop shortness of breath.  You bleed heavier than a period (soaking through 2 or more pads per hour for 2 hours in a row).  You see pus in the wound area.  MAKE SURE YOU:   Understand these instructions.  Will watch your condition.  Will get help right away if you are  not doing well or get worse. Document Released: 03/04/2004 Document Revised: 12/05/2013 Document Reviewed: 06/22/2009 [facility] Patient Information 2015 [place], [place]. This information is not intended to replace advice given to you by your health care provider. Make sure you discuss any questions you have with your health care provider.  Post Anesthesia Home Care Instructions  Activity: Get plenty of rest for the remainder of the day. A responsible individual must  stay with you for 24 hours following the procedure.  For the next 24 hours, DO NOT: -Drive a car -Paediatric nurse -Drink alcoholic beverages -Take any medication unless instructed by your physician -Make any legal decisions or sign important papers.  Meals: Start with liquid foods such as gelatin or soup. Progress to regular foods as tolerated. Avoid greasy, spicy, heavy foods. If nausea and/or vomiting occur, drink only clear liquids until the nausea and/or vomiting subsides. Call your physician if vomiting continues.  Special Instructions/Symptoms: Your throat may feel dry or sore from the anesthesia or the breathing tube placed in your throat during surgery. If this causes discomfort, gargle with warm salt water. The discomfort should disappear within 24 hours.  If you had a scopolamine patch placed behind your ear for the management of post- operative nausea and/or vomiting:  1. The medication in the patch is effective for 72 hours, after which it should be removed.  Wrap patch in a tissue and discard in the trash. Wash hands thoroughly with soap and water. 2. You may remove the patch earlier than 72 hours if you experience unpleasant side effects which may include dry mouth, dizziness or visual disturbances. 3. Avoid touching the patch. Wash your hands with soap and water after contact with the patch.

## 2018-02-18 NOTE — Interval H&P Note (Signed)
History and Physical Interval Note:  02/18/2018 9:50 AM  Kelly Mcclure  has presented today for surgery, with the diagnosis of CERVICAL DYSPLASIA  The various methods of treatment have been discussed with the patient and family. After consideration of risks, benefits and other options for treatment, the patient has consented to  Procedure(s): COLD KNIFE CONIZATION OF CERVIX WITH POSSIBLE BIOPSY (N/A) as a surgical intervention .  The patient's history has been reviewed, patient examined, no change in status, stable for surgery.  I have reviewed the patient's chart and labs.  Questions were answered to the patient's satisfaction.     Thereasa Solo

## 2018-02-18 NOTE — Anesthesia Postprocedure Evaluation (Signed)
Anesthesia Post Note  Patient: Kelly Mcclure  Procedure(s) Performed: COLD KNIFE CONIZATION OF CERVIX WITH POSSIBLE BIOPSY (N/A )     Patient location during evaluation: PACU Anesthesia Type: General Level of consciousness: awake and alert Pain management: pain level controlled Vital Signs Assessment: post-procedure vital signs reviewed and stable Respiratory status: spontaneous breathing, nonlabored ventilation, respiratory function stable and patient connected to nasal cannula oxygen Cardiovascular status: blood pressure returned to baseline and stable Postop Assessment: no apparent nausea or vomiting Anesthetic complications: no    Last Vitals:  Vitals:   02/18/18 1300 02/18/18 1346  BP: 127/83 117/77  Pulse: 96 90  Resp: 17 16  Temp:  37.3 C  SpO2: 94% 96%    Last Pain:  Vitals:   02/18/18 1346  TempSrc:   PainSc: 0-No pain                 Devario Bucklew

## 2018-02-18 NOTE — Interval H&P Note (Signed)
History and Physical Interval Note:  02/18/2018 9:46 AM  Kelly Mcclure  has presented today for surgery, with the diagnosis of CERVICAL DYSPLASIA  The various methods of treatment have been discussed with the patient and family. After consideration of risks, benefits and other options for treatment, the patient has consented to  Procedure(s): COLD KNIFE CONIZATION OF CERVIX WITH POSSIBLE BIOPSY (N/A) as a surgical intervention .  The patient's history has been reviewed, patient examined, no change in status, stable for surgery.  I have reviewed the patient's chart and labs.  Questions were answered to the patient's satisfaction.     Thereasa Solo

## 2018-02-18 NOTE — Telephone Encounter (Signed)
Returned patient's call regarding post op issues. Spoke with the patient and her mother. Patient stated "I was told to call if I had any issues/concerns. I have soaked through my pad, mesh underwear and pants that I left the hospital with." Patient stated that she is doing better now, having no fever, pain but having cramping. Patient transferred to Dr. Denman George

## 2018-02-18 NOTE — Op Note (Signed)
PATIENT: Kelly Mcclure DATE: 02/18/18   Preop Diagnosis: CIN III  Postoperative Diagnosis: same  Surgery: cold knife conization of cervix   Surgeons:  Donaciano Eva, MD Assistant: none  Anesthesia: General   Estimated blood loss: <10 ml  IVF:  261ml   Urine output: 092 ml   Complications: None   Pathology: cervical cone with marking stitch at 12 o'clock, post cone ECC  Operative findings: no grossly visible exophytic lesions on ectocervix. Cervix flush with vagina.  Procedure: The patient was identified in the preoperative holding area. Informed consent was signed on the chart. Patient was seen history was reviewed and exam was performed.   The patient was then taken to the operating room and placed in the supine position with SCD hose on. General anesthesia was then induced without difficulty. She was then placed in the dorsolithotomy position. The perineum was prepped with Betadine. The vagina was prepped with Betadine. The patient was then draped after the prep was dried. An in and out catheterization to empty the bladder was performed under sterile conditions.  Timeout was performed the patient, procedure, antibiotic, allergy, and length of procedure.   The weighted speculum was placed in the posterior vagina. The right angle retractor was placed anteriorally to visualize the cervix. A 0-vicryl suture on a UR-6 needle was used to place stay sutures (which were tagged) at 3 and 9 o'clock of the cervicovaginal junction. 10cc of 1% lidocaine with epinephrine was infiltrated into 3 and 9 o'clock of the cervicovaginal junction, circumferentially around the face of the cervix, and deep in the endocervical canal. The uterine sound was placed in the cervix to delineate the course of the endocervical canal. An 11 blade scalpel on a long knife handle was used to make an incision around the face of the cervix, inside of the stay sutures, circumferentially. A single tooth  tenaculum grasped the specimen to manipulate it. The incision was then angled towards the endocervix to amputate the specimen. It was removed, oriented with a marking stitch at the 12 o'clock ectocervix and sent to pathology. It was measured to be 3cm deep, by 2x2cm on the ectocervical face.   A post-cone ECC was collected from the endocervical canal using a kavorkian currette.  The bovie with the 21mm ball attachment was used at 45 coag to create hemostasis at the surgical bed. Monsels solution was applied to the surgical bed to consolidate this hemostasis.  The vagina was irrigated.  All instrument, suture, laparotomy, Ray-Tec, and needle counts were correct x2. The patient tolerated the procedure well and was taken recovery room in stable condition. This is [name] dictating an operative note on [facility].

## 2018-02-19 ENCOUNTER — Encounter (HOSPITAL_BASED_OUTPATIENT_CLINIC_OR_DEPARTMENT_OTHER): Payer: Self-pay | Admitting: Gynecologic Oncology

## 2018-02-22 ENCOUNTER — Telehealth: Payer: Self-pay | Admitting: *Deleted

## 2018-02-22 ENCOUNTER — Telehealth: Payer: Self-pay | Admitting: Gynecologic Oncology

## 2018-02-22 ENCOUNTER — Other Ambulatory Visit: Payer: Self-pay | Admitting: Obstetrics and Gynecology

## 2018-02-22 DIAGNOSIS — R509 Fever, unspecified: Secondary | ICD-10-CM

## 2018-02-22 MED ORDER — METRONIDAZOLE 500 MG PO TABS: 500.0000 mg | ORAL_TABLET | Freq: Two times a day (BID) | ORAL | 0 refills | Status: DC

## 2018-02-22 NOTE — Telephone Encounter (Signed)
Returned call to patient.  Patient stating she felt fine over the weekend except for mild fatigue then stated she started feeling poorly this am.  Reporting temp of 100.9, pelvic cramping, nausea, mild bleeding.  Discussed with Dr. Denman George.  Recommendation to begin Flagyl 500 mg BID for 7 days.  Patient advised to monitor her temp and reportable signs and symptoms reviewed.  She is to call with an update. No concerns voiced.

## 2018-02-22 NOTE — Telephone Encounter (Signed)
Patient left a message on our general voicemail that she was still running a fever. Per Dr. Denman George instructed patient to continue to take Tylenol every 6hrs., but not over 4000mg  in a day.  Consider the Percocet in the 4000mg .  Dr. Denman George also advised that patient can take 600mg  ibprofen every 6hrs as needed.  Patient verbalized understanding. Instructed patient that if the fever continues to rise to go to the emergency room.  Patient verbalized understanding.

## 2018-02-22 NOTE — Telephone Encounter (Signed)
Patient called and stated "I need to let Dr. Denman George know I just got sent home with a fever. My fever is 100.9, I feel crappy. I am having nausea but no vomiting. I also have a headache. I am having some bleeding but no discharge or odor. Some cramping." Melissa APP notified.

## 2018-02-23 ENCOUNTER — Other Ambulatory Visit: Payer: Self-pay | Admitting: Obstetrics and Gynecology

## 2018-02-23 ENCOUNTER — Inpatient Hospital Stay (HOSPITAL_BASED_OUTPATIENT_CLINIC_OR_DEPARTMENT_OTHER): Payer: 59 | Admitting: Gynecologic Oncology

## 2018-02-23 ENCOUNTER — Encounter: Payer: Self-pay | Admitting: Gynecologic Oncology

## 2018-02-23 VITALS — BP 104/72 | HR 109 | Temp 99.8°F | Resp 20 | Ht 66.0 in | Wt 157.7 lb

## 2018-02-23 DIAGNOSIS — N739 Female pelvic inflammatory disease, unspecified: Secondary | ICD-10-CM

## 2018-02-23 DIAGNOSIS — D069 Carcinoma in situ of cervix, unspecified: Secondary | ICD-10-CM | POA: Diagnosis not present

## 2018-02-23 DIAGNOSIS — C538 Malignant neoplasm of overlapping sites of cervix uteri: Secondary | ICD-10-CM

## 2018-02-23 MED ORDER — AMOXICILLIN-POT CLAVULANATE 875-125 MG PO TABS: 1.0000 | ORAL_TABLET | Freq: Two times a day (BID) | ORAL | 1 refills | Status: DC

## 2018-02-23 NOTE — Patient Instructions (Signed)
Your pathology result is not yet back, though Dr Denman George will call you with the results when available.  If you continue to feel unwell later in the day, start taking Augmentin twice a day IN ADDITION to the flagyl antibiotic.

## 2018-02-23 NOTE — Progress Notes (Signed)
Follow-up Note: Gyn-Onc   ORMA CHEETHAM 37 y.o. female  Chief Complaint  Patient presents with  . Malignant neoplasm of overlapping sites of cervix [facility])   Assessment :  Hx of Stage IA 1 cervical cancer 2011. Recent Pap smear showing HGSIL, ectocervical biopsies with CIN 3.   S/p CKC of cervix on 02/18/18. Postop fevers and malaise. No dysplasia on cone pathology.  Plan:  1/ postop fevers: The cone bed looks to be healing normally. It is not particularly tender or suggestive of pelvic infection. However, given the proximity of her symptoms to the procedure, I think it is valid to treat as though lower genital tract infection with augmentin and flagyl. On exam there is no evidence of damage to adjacent structures (no bladder or rectal injury).  2/ Persistent CIN and history of cervical cancer. Patient desires definitive hysterectomy. I believe it is reasonable to proceed with extrafascial hysterectomy given the pathology which reveals no residual disease in the cone bed. There is a particularly high probability for persistent dysplasia in the vagina postop, including potential risk for vaginal dysplasia and cancer requiring future treatment.   Will see her back in 2 weeks to monitor.   Interval History: On 02/18/18 she underwent cold knife conization of the cervix in the operating room.  Inspection of the cervix with acetic acid application was grossly unremarkable with no apparent lesions seen.  The upper vagina was also grossly normal.  The cervix was small, fairly flush with the surrounding vagina, however the conization procedure was relatively straightforward.  A 0.9 cm AP dimension by 1.5 cm deep dimension cone was taken without difficulty.  There is minimal bleeding in the operating room.  The patient began experiencing high fevers to 101 and 102 on postoperative day 4.  She took her temperature to confirm this.  These were somewhat refractory to Tylenol and ibuprofen.  She had minimal  pain and scant bleeding.  She began developing headaches and swollen cervical nodes.  She called multiple times and requested to be seen.  An empiric prescription of Flagyl was phoned in on postop day 4.  HPI: Patient had an abnormal Pap smear in 2011. 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 Surgical margins were positive. Therefore, the patient underwent a cold knife conization on 04/01/2010. Pathology from that specimen showed severe dysplasia with negative margins. The patient was subsequently followed with Pap smears. A pap in March 2012 was ascus. ECC was negative. Repeat ECC in June 2012 was negative. Pap smear in 03/03/2012 showed high-grade SIL and again ECC was negative. Conservative followup was recommended.  Repeat colposcopy in December 2013 identified a lesion in the posterior vaginal fornix that was biopsied. Final pathology only showed atypia. A pap smear on that same day only showed LSIL.  A Pap smear on 10/01/2012 showed high-grade SIL.  Pap smear in March was low grade SIL. Patient subsequently underwent cervical dilatation, endocervical curettage, and vaginal biopsy (03/01/2013). The only abnormality found was atypia on vaginal biopsy.  October 2014 Pap smear with low-grade SIL.  March 21, 2016 Pap smear showed HGSIL.  Endocervical curettage showed benign endocervical glands.  Pap smear July 15, 2016 showed HGSIL.  Endocervical curettage showed benign endocervical glands.  January 13, 2018 Pap smear showed HGSIL.  Exam by Dr Fermin Schwab on July 9th showed a friable exophytic lesion on the cervix which was biopsied. Final pathology showed "at least CIN III, invasion could not be  ruled out". Colpo had been difficult as the cervix was flush with the vagina and the upper vagina was narrowed.  The patient was interested in a definitive hysterectomy procedure however she required an excisional procedure to rule  out invasive carcinoma before having this.  Review of Systems:10 point review of systems is negative except as noted in interval history.   Vitals: Blood pressure 104/72, pulse (!) 109, temperature 99.8 F (37.7 C), temperature source Oral, resp. rate 20, height '5\' 6"'  (1.676 m), weight 157 lb 11.2 oz (71.5 kg), SpO2 98 %, not currently breastfeeding.  Physical Exam: General : The patient is a healthy woman in no acute distress.  HEENT: normocephalic, extraoccular movements normal; neck is supple without thyromegally  Lynphnodes: Supraclavicular and inguinal nodes not enlarged  Abdomen: Soft, non-tender, no ascites, no organomegally, no masses, no hernias  Pelvic:  EGBUS: Normal female  Vagina: Normal, no lesions  Urethra and Bladder: Normal, non-tender  Cervix: Flush with the vaginal fornices.  There is a small exophytic area that is friable. Uterus: Anterior normal shape size and consistency Bi-manual examination: Non-tender; no adenxal masses or nodularity  Lower extremities: No edema or varicosities. Normal range of motion    Procedure note: Colposcopy is performed after applying acetic acid.  In the course of applying acetic acid a friable area began to bleed.  Colposcopy was somewhat difficult because the narrowing of the upper vagina and the cervix being flush with the vaginal fornices.  Attempts at biopsies were obtained on 3 occasions.     Allergies  Allergen Reactions  . Sulfa Antibiotics Swelling    Tongue swelling, throat irritated  . Adhesive [Tape] Other (See Comments)    "skin burn"  . Other Swelling and Other (See Comments)    Magic mouth wash/ causes swelling of the tongue  . Relpax [Eletriptan Hydrobromide] Other (See Comments)    Stroke-like symptoms  . Ultram [Tramadol] Other (See Comments)    2004--  Per pt had seizure due to interaction with other medication she was taking at the time    Past Medical History:  Diagnosis Date  . H/O radioactive iodine  thyroid ablation 12/2009  . 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 History of cervical dysplasia   . History of hyperthyroidism    per pt dx 2000 -- s/p RAI  2011  . History of kidney stones 2011  . Hx of varicella   . Hypothyroidism, postradioiodine therapy    endocrinology-  dr Cruzita Lederer  . Migraines   . Mild persistent asthma 01/19/2018   followed by dr Verlin Fester ([facility])  . 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 Vaginal Pap smear, abnormal     Past Surgical History:  Procedure Laterality Date  . ABDOMINAL CERCLAGE N/A 11/19/2016   Procedure: LAPAROSCOPIC TRANSABDOMINAL CERVICAL-ISTHMUSx2;  Surgeon: Governor Specking, MD;  Location: [facility];  Service: Gynecology;  Laterality: N/A;  with ultrasound guidance. Start laparoscopically HOLD OPEN ABDOMINAL ITEMS   . CERCLAGE LAPAROSCOPIC ABDOMINAL N/A 11/19/2016   Procedure: CERCLAGE LAPAROSCOPIC ABDOMINAL;  Surgeon: Governor Specking, MD;  Location: [facility];  Service: Gynecology;  Laterality: N/A;  . CERVICAL CONIZATION W/BX N/A 02/18/2018   Procedure: COLD KNIFE CONIZATION OF CERVIX WITH POSSIBLE BIOPSY;  Surgeon: [name], MD;  Location: [facility];  Service: Gynecology;  Laterality: N/A;  . CESAREAN SECTION N/A 05/27/2017   Procedure: Primary CESAREAN SECTION;  Surgeon: Brien Few, MD;  Location: [facility];  Service: Obstetrics;  Laterality: N/A;  EDD: 06/07/17 Allergy: Adhesive, Relpax, Sulfa, Ultram  . COLD KNIFE CONIZATION  03-29-2010   dr Ronita Hipps  [facility]  . DILATION AND CURETTAGE OF UTERUS  2009  . DILATION AND CURETTAGE OF UTERUS N/A 03/01/2013   Procedure: CERVICAL DILATATION AND ENDOCERVICAL CURRETAGE AND VAAGINAL BIOPIES;  Surgeon: Alvino Chapel, MD;  Location: [facility];  Service: Gynecology;  Laterality: N/A;  . LEEP   02/25/2010;  12/ 2012  dr Ronita Hipps office  . NASAL SEPTUM SURGERY  2006   Repair of deviated septum  . SIGMOIDOSCOPY  2009  . TONSILLECTOMY AND ADENOIDECTOMY  1995    Current Outpatient Medications  Medication Sig Dispense Refill  . albuterol (PROVENTIL HFA;VENTOLIN HFA) 108 (90 Base) MCG/ACT inhaler Inhale 2 puffs into the lungs every 6 (six) hours as needed for wheezing or shortness of breath. (Patient taking differently: Inhale 2 puffs into the lungs every 6 (six) hours as needed for wheezing or shortness of breath. ) 1 Inhaler 1  . ALPRAZolam (XANAX) 0.5 MG tablet TAKE 1 TABLET (0.5 MG TOTAL) BY MOUTH AT BEDTIME AS NEEDED FOR ANXIETY. (Patient taking differently: Take 0.5 mg by mouth at bedtime as needed for anxiety. ) 30 tablet 0  . azelastine (ASTELIN) 0.1 % nasal spray Place 2 sprays into both nostrils 2 (two) times daily. (Patient taking differently: Place 2 sprays into both nostrils 2 (two) times daily. ) 30 mL 1  . busPIRone (BUSPAR) 5 MG tablet Take 5 mg by mouth 2 (two) times daily as needed.   0  . butalbital-acetaminophen-caffeine (FIORICET, ESGIC) 50-325-40 MG tablet Take 1 tablet by mouth every 4 (four) hours as needed for headache.     Marland Kitchen FLUoxetine (PROZAC) 40 MG capsule Take 40 mg by mouth daily as needed.   0  . fluticasone (FLOVENT HFA) 110 MCG/ACT inhaler Inhale 2 puffs into the lungs 2 (two) times daily. (Patient taking differently: Inhale 2 puffs into the lungs 2 (two) times daily. ) 12 g 1  . folic acid (FOLVITE) 1 MG tablet Take 1 mg by mouth 4 (four) times daily.   3  . LamoTRIgine (LAMICTAL XR) 200 MG TB24 24 hour tablet Take 2 tablets (400 mg total) by mouth 2 (two) times daily. (Patient taking differently: Take 400 mg by mouth 2 (two) times daily. Takes 1000 and 2200) 360 tablet 4  . LamoTRIgine 100 MG TB24 24 hour tablet Take 1 tablet (100 mg total) by mouth at bedtime. (Patient taking differently: Take 100 mg by mouth at bedtime. ) 30 tablet 11  . levETIRAcetam  (KEPPRA) 750 MG tablet Take 2 tablets (1,500 mg total) by mouth 2 (two) times daily. (Patient taking differently: Take 1,500 mg by mouth 2 (two) times daily. ) 360 tablet 4  . loratadine (CLARITIN) 10 MG tablet Take 10 mg by mouth every morning.     . metroNIDAZOLE (FLAGYL) 500 MG tablet Take 1 tablet (500 mg total) by mouth 2 (two) times daily. 14 tablet 0  . montelukast (SINGULAIR) 10 MG tablet Take 1 tablet (10 mg total) by mouth at bedtime. (Patient taking differently: Take 10 mg by mouth at bedtime. ) 30 tablet 1  . oxyCODONE-acetaminophen (PERCOCET) 5-325 MG tablet Take 1-2 tablets by mouth every 4 (four) hours as needed for severe pain. 30 tablet 0  .  senna (SENOKOT) 8.6 MG TABS tablet Take 1 tablet (8.6 mg total) by mouth at bedtime. (Patient taking differently: Take 1 tablet by mouth as needed. ) 120 each 0  . SYNTHROID 150 MCG tablet Take 1 tablet (150 mcg total) by mouth daily before breakfast. (Patient taking differently: Take 150 mcg by mouth daily before breakfast. ) 90 tablet 3  . amoxicillin-clavulanate (AUGMENTIN) 875-125 MG tablet Take 1 tablet by mouth 2 (two) times daily. 14 tablet 1   No current facility-administered medications for this visit.     Social History   Socioeconomic History  . Marital status: Single    Spouse name: Not on file  . Number of children: Not on file  . Years of education: Not on file  . Highest education level: Not on file  Occupational History  . Not on file  Social Needs  . Financial resource strain: Not on file  . Food insecurity:    Worry: Not on file    Inability: Not on file  . Transportation needs:    Medical: Not on file    Non-medical: Not on file  Tobacco Use  . Smoking status: Never Smoker  . Smokeless tobacco: Never Used  Substance and Sexual Activity  . Alcohol use: Not Currently  . Drug use: No  . Sexual activity: Yes    Birth control/protection: None  Lifestyle  . Physical activity:    Days per week: Not on file     Minutes per session: Not on file  . Stress: Not on file  Relationships  . Social connections:    Talks on phone: Not on file    Gets together: Not on file    Attends religious service: Not on file    Active member of club or organization: Not on file    Attends meetings of clubs or organizations: Not on file    Relationship status: Not on file  . Intimate partner violence:    Fear of current or ex partner: Not on file    Emotionally abused: Not on file    Physically abused: Not on file    Forced sexual activity: Not on file  Other Topics Concern  . Not on file  Social History Narrative  . Not on file    Family History  Problem Relation Age of Onset  . Leukemia Other   . Diabetes Other   . Diabetes Mother   . Skin cancer Mother   . Hyperlipidemia Mother   . Hypertension Father   . Asthma Father   . Skin cancer Maternal Aunt   . Multiple myeloma Maternal Aunt   . Testicular cancer Maternal Uncle   . Hyperlipidemia Maternal Grandmother   . AAA (abdominal aortic aneurysm) Maternal Grandmother   . COPD Maternal Grandmother   . Prostate cancer Maternal Grandfather   . AAA (abdominal aortic aneurysm) Maternal Grandfather       Thereasa Solo, MD 02/23/2018, 5:17 PM

## 2018-02-24 ENCOUNTER — Telehealth: Payer: Self-pay

## 2018-02-24 NOTE — Telephone Encounter (Signed)
Per [name] NP- contact patient to see how she feels today.  Ongoing call to patient, she reports she is feeling "a whole lot better".  Reports temp this morning was 99.8 and she took Tylenol and now her temp is 98.5.  She is taking the Flagyl only - has not started Augmentin - was instructed not to start unless Flagyl not helping.  She is on her third dose of Flagyl.  Encouraged pt to drink plenty of fluids, water especially and to monitor for worsening symptoms ie unusual discharge, bleeding, pain, or fever returning or worsening.  Pt voiced understanding. No other needs per pt at this time.

## 2018-02-26 ENCOUNTER — Telehealth: Payer: Self-pay | Admitting: Gynecologic Oncology

## 2018-02-26 ENCOUNTER — Other Ambulatory Visit: Payer: Self-pay | Admitting: Neurology

## 2018-02-26 NOTE — Telephone Encounter (Signed)
Rx registry checked. Last fill date is 01/22/18 for #30. Next OV is 07/08/18.

## 2018-02-26 NOTE — Telephone Encounter (Signed)
Patient stating she is feeling much better.  No more fevers.  She did not need to start the Augmentin prescribed at her last visit.  In regards to the recommendation for a hysterectomy, she states she would like to hold off at this time.  Advised she could discuss with Dr. Denman George in further detail at her post-op check.  No concerns voiced.  Advised to call for any needs or concerns.

## 2018-03-05 ENCOUNTER — Inpatient Hospital Stay: Payer: 59 | Admitting: Gynecologic Oncology

## 2018-03-10 NOTE — Progress Notes (Signed)
Follow-up Note: Gyn-Onc   Kelly Mcclure 37 y.o. female  Chief Complaint  Patient presents with  . Malignant neoplasm of overlapping sites of cervix (HCC)   Assessment :  Hx of Stage IA 1 cervical cancer 2011. Recent Pap smear showing HGSIL, ectocervical biopsies with CIN 3.   S/p CKC of cervix on 02/18/18. No dysplasia on cone pathology, however there is a concerning posterior upper vaginal wall nodule.   Plan:  1/ postop fevers: resolved with flagyl. Unclear if these were related to CKC or coincidental.   2/ Persistent CIN and history of cervical cancer. Negative CKC in July, 2019. I have some concern that the abnormal pap is originating from this upper posterior vaginal mass. We will follow-up today's biopsy. If malignant, would recommend considering either primary excision with hysterectomy, upper vaginectomy, rectosigmoid resection versus primary radiation. If it reveals dysplasia, a more localized resection may be appropriate. If it is benign, close follow-up with repeat pap in 6 months is appropriate.   HPI: Patient had an abnormal Pap smear in 2011. 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 Surgical margins were positive. Therefore, the patient underwent a cold knife conization on 04/01/2010. Pathology from that specimen showed severe dysplasia with negative margins. The patient was subsequently followed with Pap smears. A pap in March 2012 was ascus. ECC was negative. Repeat ECC in June 2012 was negative. Pap smear in 03/03/2012 showed high-grade SIL and again ECC was negative. Conservative followup was recommended.  Repeat colposcopy in December 2013 identified a lesion in the posterior vaginal fornix that was biopsied. Final pathology only showed atypia. A pap smear on that same day only showed LSIL.  A Pap smear on 10/01/2012 showed high-grade SIL.  Pap smear in March was low grade SIL. Patient subsequently  underwent cervical dilatation, endocervical curettage, and vaginal biopsy (03/01/2013). The only abnormality found was atypia on vaginal biopsy.  October 2014 Pap smear with low-grade SIL.  March 21, 2016 Pap smear showed HGSIL.  Endocervical curettage showed benign endocervical glands.  Pap smear July 15, 2016 showed HGSIL.  Endocervical curettage showed benign endocervical glands.  January 13, 2018 Pap smear showed HGSIL.  Exam by Dr Clarke-Pearson on July 9th showed a friable exophytic lesion on the cervix which was biopsied. Final pathology showed "at least CIN III, invasion could not be ruled out". Colpo had been difficult as the cervix was flush with the vagina and the upper vagina was narrowed.  The patient was interested in a definitive hysterectomy procedure however she required an excisional procedure to rule out invasive carcinoma before having this.  On 02/18/18 she underwent cold knife conization of the cervix in the operating room.  Inspection of the cervix with acetic acid application was grossly unremarkable with no apparent lesions seen.  The upper vagina was also grossly normal.  The cervix was small, fairly flush with the surrounding vagina, however the conization procedure was relatively straightforward.  A 0.9 cm AP dimension by 1.5 cm deep dimension cone was taken without difficulty.  There is minimal bleeding in the operating room.  The patient began experiencing high fevers to 101 and 102 on postoperative day 4 and was prescribed empiric antibiotics. Exam revealed no concerning signs.  Final pathology revealed no residual dysplasia.  Interval History:  She has resolved fevers but has some cramping pains across the lower abdomen. She has persistent spotting daily.  Review of Systems:10 point review of systems is negative   except as noted in interval history.   Vitals: Blood pressure (!) 124/94, pulse 88, temperature 98.3 F (36.8 C), temperature source Oral, resp.  rate 18, height 5' 6" (1.676 m), weight 160 lb 4.8 oz (72.7 kg), SpO2 98 %, not currently breastfeeding.  Physical Exam: General : The patient is a healthy woman in no acute distress.  HEENT: normocephalic, extraoccular movements normal; neck is supple without thyromegally  Lynphnodes: Supraclavicular and inguinal nodes not enlarged  Abdomen: Soft, non-tender, no ascites, no organomegally, no masses, no hernias  Pelvic:  EGBUS: Normal female  Vagina: Normal, no lesions  Urethra and Bladder: Normal, non-tender  Cervix: Flush with the vaginal fornices cone bed healing normally.  There is a 1cm exophytic area that is friable, it is nodular, it is arising from the posterior upper vagina (separate from the posterior lip of the cervix). Biopsied. Uterus: Anterior normal shape size and consistency Bi-manual examination: Non-tender; no adenxal masses or nodularity  Lower extremities: No edema or varicosities. Normal range of motion   Allergies  Allergen Reactions  . Sulfa Antibiotics Swelling    Tongue swelling, throat irritated  . Adhesive [Tape] Other (See Comments)    "skin burn"  . Other Swelling and Other (See Comments)    Magic mouth wash/ causes swelling of the tongue  . Relpax [Eletriptan Hydrobromide] Other (See Comments)    Stroke-like symptoms  . Ultram [Tramadol] Other (See Comments)    2004--  Per pt had seizure due to interaction with other medication she was taking at the time    Past Medical History:  Diagnosis Date  . H/O radioactive iodine thyroid ablation 12/2009  . 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 History of cervical dysplasia   . History of hyperthyroidism    per pt dx 2000 -- s/p RAI  2011  . History of kidney stones 2011  . Hx of varicella   . Hypothyroidism, postradioiodine therapy    endocrinology-  dr gherghe  . Migraines   . Mild persistent asthma 01/19/2018   [facility]itt [facility]  . 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 Vaginal Pap smear, abnormal     Past Surgical History:  Procedure Laterality Date  . ABDOMINAL CERCLAGE N/A 11/19/2016   Procedure: LAPAROSCOPIC TRANSABDOMINAL CERVICAL-ISTHMUSx2;  Surgeon: Tamer Yalcinkaya, MD;  Location: [facility];  Service: Gynecology;  Laterality: N/A;  with ultrasound guidance. Start laparoscopically HOLD OPEN ABDOMINAL ITEMS   . CERCLAGE LAPAROSCOPIC ABDOMINAL N/A 11/19/2016   Procedure: CERCLAGE LAPAROSCOPIC ABDOMINAL;  Surgeon: Tamer Yalcinkaya, MD;  Location: [facility];  Service: Gynecology;  Laterality: N/A;  . CERVICAL CONIZATION W/BX N/A 02/18/2018   Procedure: COLD KNIFE CONIZATION OF CERVIX WITH POSSIBLE BIOPSY;  Surgeon: Rossi, Emma, MD;  Location: [facility];  Service: Gynecology;  Laterality: N/A;  . CESAREAN SECTION N/A 05/27/2017   Procedure: Primary CESAREAN SECTION;  Surgeon: Taavon, Richard, MD;  Location: [facility];  Service: Obstetrics;  Laterality: N/A;  EDD: 06/07/17 Allergy: Adhesive, Relpax, Sulfa, Ultram  . COLD KNIFE CONIZATION  03-29-2010   dr taavon  [facility]  . DILATION AND CURETTAGE OF UTERUS  2009  . DILATION AND CURETTAGE OF UTERUS N/A 03/01/2013   Procedure: CERVICAL DILATATION AND ENDOCERVICAL CURRETAGE AND VAAGINAL BIOPIES;  Surgeon: Daniel L   Clarke-Pearson, MD;  Location: [facility];  Service: Gynecology;  Laterality: N/A;  . LEEP  02/25/2010;  12/ 2012  dr Ronita Hipps office  . NASAL SEPTUM SURGERY  2006   Repair of deviated septum  . SIGMOIDOSCOPY  2009  . TONSILLECTOMY AND ADENOIDECTOMY  1995    Current Outpatient Medications  Medication Sig Dispense Refill  . albuterol (PROVENTIL HFA;VENTOLIN HFA) 108 (90 Base) MCG/ACT inhaler Inhale 2 puffs into the lungs every 6 (six) hours as needed for wheezing or shortness of breath. (Patient taking differently:  Inhale 2 puffs into the lungs every 6 (six) hours as needed for wheezing or shortness of breath. ) 1 Inhaler 1  . ALPRAZolam (XANAX) 0.5 MG tablet Take 1 tablet (0.5 mg total) by mouth at bedtime as needed for anxiety. 30 tablet 3  . azelastine (ASTELIN) 0.1 % nasal spray Place 2 sprays into both nostrils 2 (two) times daily. (Patient taking differently: Place 2 sprays into both nostrils 2 (two) times daily. ) 30 mL 1  . busPIRone (BUSPAR) 5 MG tablet Take 5 mg by mouth 2 (two) times daily as needed.   0  . butalbital-acetaminophen-caffeine (FIORICET, ESGIC) 50-325-40 MG tablet Take 1 tablet by mouth every 4 (four) hours as needed for headache.     Marland Kitchen FLUoxetine (PROZAC) 40 MG capsule Take 40 mg by mouth daily as needed.   0  . fluticasone (FLOVENT HFA) 110 MCG/ACT inhaler Inhale 2 puffs into the lungs 2 (two) times daily. (Patient taking differently: Inhale 2 puffs into the lungs 2 (two) times daily. ) 12 g 1  . folic acid (FOLVITE) 1 MG tablet Take 1 mg by mouth 4 (four) times daily.   3  . LamoTRIgine (LAMICTAL XR) 200 MG TB24 24 hour tablet Take 2 tablets (400 mg total) by mouth 2 (two) times daily. (Patient taking differently: Take 400 mg by mouth 2 (two) times daily. Takes 1000 and 2200) 360 tablet 4  . LamoTRIgine 100 MG TB24 24 hour tablet Take 1 tablet (100 mg total) by mouth at bedtime. (Patient taking differently: Take 100 mg by mouth at bedtime. ) 30 tablet 11  . levETIRAcetam (KEPPRA) 750 MG tablet Take 2 tablets (1,500 mg total) by mouth 2 (two) times daily. (Patient taking differently: Take 1,500 mg by mouth 2 (two) times daily. ) 360 tablet 4  . loratadine (CLARITIN) 10 MG tablet Take 10 mg by mouth every morning.     . montelukast (SINGULAIR) 10 MG tablet Take 1 tablet (10 mg total) by mouth at bedtime. (Patient taking differently: Take 10 mg by mouth at bedtime. ) 30 tablet 1  . SYNTHROID 150 MCG tablet Take 1 tablet (150 mcg total) by mouth daily before breakfast. (Patient taking  differently: Take 150 mcg by mouth daily before breakfast. ) 90 tablet 3   No current facility-administered medications for this visit.     Social History   Socioeconomic History  . Marital status: Single    Spouse name: Not on file  . Number of children: Not on file  . Years of education: Not on file  . Highest education level: Not on file  Occupational History  . Not on file  Social Needs  . Financial resource strain: Not on file  . Food insecurity:    Worry: Not on file    Inability: Not on file  . Transportation needs:    Medical: Not on file    Non-medical: Not on file  Tobacco Use  . Smoking  status: Never Smoker  . Smokeless tobacco: Never Used  Substance and Sexual Activity  . Alcohol use: Not Currently  . Drug use: No  . Sexual activity: Yes    Birth control/protection: None  Lifestyle  . Physical activity:    Days per week: Not on file    Minutes per session: Not on file  . Stress: Not on file  Relationships  . Social connections:    Talks on phone: Not on file    Gets together: Not on file    Attends religious service: Not on file    Active member of club or organization: Not on file    Attends meetings of clubs or organizations: Not on file    Relationship status: Not on file  . Intimate partner violence:    Fear of current or ex partner: Not on file    Emotionally abused: Not on file    Physically abused: Not on file    Forced sexual activity: Not on file  Other Topics Concern  . Not on file  Social History Narrative  . Not on file    Family History  Problem Relation Age of Onset  . Leukemia Other   . Diabetes Other   . Diabetes Mother   . Skin cancer Mother   . Hyperlipidemia Mother   . Hypertension Father   . Asthma Father   . Skin cancer Maternal Aunt   . Multiple myeloma Maternal Aunt   . Testicular cancer Maternal Uncle   . Hyperlipidemia Maternal Grandmother   . AAA (abdominal aortic aneurysm) Maternal Grandmother   . COPD  Maternal Grandmother   . Prostate cancer Maternal Grandfather   . AAA (abdominal aortic aneurysm) Maternal Grandfather    Procedure note: Vaginal biopsy  Preop Dx: vaginal mass, CIN 3 pap Postop Dx: same Procedure : vaginal biopsy Surgeon: [name], MD Complications: none Specimens: posterior vaginal wall for histopathology EBL: minimal Procedure: the speculum was placed and the posterior upper vagina wall nodule was seen.  Verbal consent was obtained as well as a timeout. The biopsy forceps were used to grasp 2 fragments of tissue from the central portion of the lesion. These were made hemostatic with silver nitrate. The patient tolerated the procedure well.      Thereasa Solo, MD 03/11/2018, 12:08 PM

## 2018-03-11 ENCOUNTER — Encounter: Payer: Self-pay | Admitting: Gynecologic Oncology

## 2018-03-11 ENCOUNTER — Inpatient Hospital Stay: Payer: 59 | Attending: Gynecologic Oncology | Admitting: Gynecologic Oncology

## 2018-03-11 VITALS — BP 124/94 | HR 88 | Temp 98.3°F | Resp 18 | Ht 66.0 in | Wt 160.3 lb

## 2018-03-11 DIAGNOSIS — D398 Neoplasm of uncertain behavior of other specified female genital organs: Secondary | ICD-10-CM

## 2018-03-11 DIAGNOSIS — C538 Malignant neoplasm of overlapping sites of cervix uteri: Secondary | ICD-10-CM | POA: Insufficient documentation

## 2018-03-11 DIAGNOSIS — N898 Other specified noninflammatory disorders of vagina: Secondary | ICD-10-CM

## 2018-03-11 NOTE — Patient Instructions (Signed)
Dr Serita Grit office will follow-up with you regarding the biopsy results from today.   If there is dysplasia or malignancy of this lesion on the back wall of the vagina you will require a procedure to treat this area.  If it does not show dysplasia or malignancy it will require close observation with repeat pap smear in 6 months.  Please contact Dr Serita Grit office at [phone] in [place] to schedule this January 2020 pap smear.

## 2018-03-15 ENCOUNTER — Other Ambulatory Visit: Payer: Self-pay | Admitting: Obstetrics and Gynecology

## 2018-03-16 ENCOUNTER — Ambulatory Visit: Payer: 59 | Admitting: Allergy and Immunology

## 2018-03-18 ENCOUNTER — Other Ambulatory Visit: Payer: Self-pay | Admitting: Allergy and Immunology

## 2018-03-18 NOTE — Telephone Encounter (Signed)
Courtesy refill  

## 2018-03-22 ENCOUNTER — Ambulatory Visit: Payer: 59 | Admitting: Allergy and Immunology

## 2018-03-24 ENCOUNTER — Ambulatory Visit: Payer: 59 | Admitting: Internal Medicine

## 2018-03-24 ENCOUNTER — Encounter: Payer: Self-pay | Admitting: Internal Medicine

## 2018-03-24 VITALS — BP 102/60 | HR 84 | Ht 66.0 in | Wt 159.0 lb

## 2018-03-24 DIAGNOSIS — E890 Postprocedural hypothyroidism: Secondary | ICD-10-CM

## 2018-03-24 LAB — T4, FREE: Free T4: 0.99 ng/dL (ref 0.60–1.60)

## 2018-03-24 LAB — TSH: TSH: 0.8 u[IU]/mL (ref 0.35–4.50)

## 2018-03-24 NOTE — Progress Notes (Signed)
Patient ID: Kelly Mcclure, female   DOB: 04-18-1981, 37 y.o.   MRN: [id number]    HPI  Kelly Mcclure is a 37 y.o.-year-old female, initially referred by her ObGyn Dr., Dr. 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 Last visit 6 months ago.  Since last visit, dx'ed with environmental allergies. Staretd allergy meds.  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 She was started on Synthroid 2003 (!) soon after.  She had a thyroid uptake and scan >> normal uptake but again demonstrated hot nodule on scan >> RAI treatment in 12/2009 (Dr. Chalmers Cater), after which she developed hypothyroidism >> started on levothyroxine, currently on d.a.w. Synthroid.  Pt is on Synthroid d.a.w. 150 mcg daily: - in am - fasting - at least 30 min from b'fast - no Ca, Fe, MVI, PPIs - not on Biotin  I reviewed patient's TFTs:  Received labs from [facility], drawn on 12/07/2017: TSH 0.85, free T3 3.0, free T4 1.29, all normal  Previously: 08/28/2017: TSH: 1.28, Free T4: 1.31 07/08/2017: TSH 0.19, free T3 3.2, free T4 1.42 - on levothyroxine 175 mcg daily >> decreased to 6 out of 7 days  04/30/2017  TSH 1.4, free T4 1.48, free T3 3.1 02/19/2017. These were perfect: TSH 1.7, free T4 1.57 (0.82-1.77), free t3 3.4 (2-4.4).  12/10/2016: TSH 0.54 (0.4-4), free T3 3.3 (2-4.4), free T4 1.79 (0.82-1.77) - on LT4 175 mcg 11/11/2016: TSH 0.58 (0.4-4), free T3 3.3 (2-4.4), free T4 1.79 (0.82-1.77) - on LT4 175 mcg 10/2016: TSH 14 - on LT4 137 mcg  Lab Results  Component Value Date   TSH 5.78 (H) 03/20/2017   TSH 5.27 01/16/2017   FREET4 0.91 03/20/2017    Pt denies: - feeling nodules in neck - hoarseness - dysphagia - choking - SOB with lying down  She has + FH of thyroid disorders in: great aunt. + poss FH of thyroid cancer. No FH of thyroid cancer. No h/o radiation tx to head or neck, other than RAI treatment for her  hyperthyroidism.  No herbal supplements. No Biotin use. No recent steroids use.   Pt. also has a history of cervical cancer >> she had conization and cerclage. She has a h/o absence seizures.  On Lamictal and Keppra. Doses adjusted after last visit.  She gave birth in 05/2017 (cesarean section).  ROS: Constitutional: + weight gain/no weight loss, + fatigue, no subjective hyperthermia, no subjective hypothermia Eyes: + blurry vision, no xerophthalmia ENT: no sore throat, + see HPI Cardiovascular: no CP/no SOB/no palpitations/no leg swelling Respiratory: no cough/no SOB/no wheezing Gastrointestinal: no N/no V/no D/no C/no acid reflux Musculoskeletal: no muscle aches/+ joint aches Skin: no rashes, no hair loss Neurological: no tremors/no numbness/no tingling/no dizziness, + HA  I reviewed pt's medications, allergies, PMH, social hx, family hx, and changes were documented in the history of present illness. Otherwise, unchanged from my initial visit note.  Past Medical History:  Diagnosis Date  . H/O radioactive iodine thyroid ablation 12/2009  . 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 History of cervical dysplasia   . History of hyperthyroidism    per pt dx 2000 -- s/p RAI  2011  . History of kidney stones 2011  . Hx of varicella   . Hypothyroidism, postradioiodine therapy    endocrinology-  dr Cruzita Lederer  . Migraines   . Mild persistent asthma 01/19/2018   followed by dr  bobbitt ([facility])  . 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 Vaginal Pap smear, abnormal    Past Surgical History:  Procedure Laterality Date  . ABDOMINAL CERCLAGE N/A 11/19/2016   Procedure: LAPAROSCOPIC TRANSABDOMINAL CERVICAL-ISTHMUSx2;  Surgeon: Governor Specking, MD;  Location: [facility];  Service: Gynecology;  Laterality: N/A;   with ultrasound guidance. Start laparoscopically HOLD OPEN ABDOMINAL ITEMS   . CERCLAGE LAPAROSCOPIC ABDOMINAL N/A 11/19/2016   Procedure: CERCLAGE LAPAROSCOPIC ABDOMINAL;  Surgeon: Governor Specking, MD;  Location: [facility];  Service: Gynecology;  Laterality: N/A;  . CERVICAL CONIZATION W/BX N/A 02/18/2018   Procedure: COLD KNIFE CONIZATION OF CERVIX WITH POSSIBLE BIOPSY;  Surgeon: [name], MD;  Location: [facility];  Service: Gynecology;  Laterality: N/A;  . CESAREAN SECTION N/A 05/27/2017   Procedure: Primary CESAREAN SECTION;  Surgeon: Brien Few, MD;  Location: [facility];  Service: Obstetrics;  Laterality: N/A;  EDD: 06/07/17 Allergy: Adhesive, Relpax, Sulfa, Ultram  . COLD KNIFE CONIZATION  03-29-2010   dr Ronita Hipps  [facility]  . DILATION AND CURETTAGE OF UTERUS  2009  . DILATION AND CURETTAGE OF UTERUS N/A 03/01/2013   Procedure: CERVICAL DILATATION AND ENDOCERVICAL CURRETAGE AND VAAGINAL BIOPIES;  Surgeon: Alvino Chapel, MD;  Location: [facility];  Service: Gynecology;  Laterality: N/A;  . LEEP  02/25/2010;  12/ 2012  dr Ronita Hipps office  . NASAL SEPTUM SURGERY  2006   Repair of deviated septum  . SIGMOIDOSCOPY  2009  . TONSILLECTOMY AND ADENOIDECTOMY  1995   Social History   Social History  . Marital status: Engaged    Spouse name: N/A  . Number of children: 0   Occupational History  . Triage Administrative Coordinator at [employer]  . Smoking status: Never Smoker  . Smokeless tobacco: Never Used  . Alcohol use Yes     Comment: Rare  . Drug use: No   Current Outpatient Medications on File Prior to Visit  Medication Sig Dispense Refill  . albuterol (PROVENTIL HFA;VENTOLIN HFA) 108 (90 Base) MCG/ACT inhaler Inhale 2 puffs into the lungs every 6 (six) hours as needed for wheezing or shortness of breath. (Patient taking differently: Inhale 2 puffs into the lungs every 6 (six) hours as needed for wheezing or shortness of  breath. ) 1 Inhaler 1  . ALPRAZolam (XANAX) 0.5 MG tablet Take 1 tablet (0.5 mg total) by mouth at bedtime as needed for anxiety. 30 tablet 3  . busPIRone (BUSPAR) 5 MG tablet Take 5 mg by mouth 2 (two) times daily as needed.   0  . butalbital-acetaminophen-caffeine (FIORICET, ESGIC) 50-325-40 MG tablet Take 1 tablet by mouth every 4 (four) hours as needed for headache.     Marland Kitchen FLUoxetine (PROZAC) 40 MG capsule Take 40 mg by mouth daily as needed.   0  . LamoTRIgine (LAMICTAL XR) 200 MG TB24 24 hour tablet Take 2 tablets (400 mg total) by mouth 2 (two) times daily. (Patient taking differently: Take 400 mg by mouth 2 (two) times daily. Takes 1000 and 2200) 360 tablet 4  . LamoTRIgine 100 MG TB24 24 hour tablet Take 1 tablet (100 mg total) by mouth at bedtime. (Patient taking differently: Take 100 mg by mouth at bedtime. ) 30 tablet 11  . levETIRAcetam (KEPPRA) 750  MG tablet Take 2 tablets (1,500 mg total) by mouth 2 (two) times daily. (Patient taking differently: Take 1,500 mg by mouth 2 (two) times daily. ) 360 tablet 4  . loratadine (CLARITIN) 10 MG tablet Take 10 mg by mouth every morning.     . montelukast (SINGULAIR) 10 MG tablet TAKE 1 TABLET BY MOUTH EVERYDAY AT BEDTIME 30 tablet 0  . SYNTHROID 150 MCG tablet Take 1 tablet (150 mcg total) by mouth daily before breakfast. (Patient taking differently: Take 150 mcg by mouth daily before breakfast. ) 90 tablet 3   No current facility-administered medications on file prior to visit.    Allergies  Allergen Reactions  . Sulfa Antibiotics Swelling    Tongue swelling, throat irritated  . Adhesive [Tape] Other (See Comments)    "skin burn"  . Other Swelling and Other (See Comments)    Magic mouth wash/ causes swelling of the tongue  . Relpax [Eletriptan Hydrobromide] Other (See Comments)    Stroke-like symptoms  . Ultram [Tramadol] Other (See Comments)    2004--  Per pt had seizure due to interaction with other medication she was taking at the  time   Family History  Problem Relation Age of Onset  . Leukemia Other   . Diabetes Other   . Diabetes Mother   . Skin cancer Mother   . Hyperlipidemia Mother   . Hypertension Father   . Asthma Father   . Skin cancer Maternal Aunt   . Multiple myeloma Maternal Aunt   . Testicular cancer Maternal Uncle   . Hyperlipidemia Maternal Grandmother   . AAA (abdominal aortic aneurysm) Maternal Grandmother   . COPD Maternal Grandmother   . Prostate cancer Maternal Grandfather   . AAA (abdominal aortic aneurysm) Maternal Grandfather    PE: BP 102/60   Pulse 84   Ht '5\' 6"'  (1.676 m)   Wt 159 lb (72.1 kg)   LMP 03/22/2018   SpO2 98%   Breastfeeding? No   BMI 25.66 kg/m  Wt Readings from Last 3 Encounters:  03/24/18 159 lb (72.1 kg)  03/11/18 160 lb 4.8 oz (72.7 kg)  02/23/18 157 lb 11.2 oz (71.5 kg)   Constitutional: Normal weight, in NAD Eyes: PERRLA, EOMI, no exophthalmos ENT: moist mucous membranes, no thyromegaly, no cervical lymphadenopathy Cardiovascular: RRR, No MRG Respiratory: CTA B Gastrointestinal: abdomen soft, NT, ND, BS+ Musculoskeletal: no deformities, strength intact in all 4 Skin: moist, warm, no rashes Neurological: no tremor with outstretched hands, DTR normal in all 4  ASSESSMENT: 1. Postablative Hypothyroidism  PLAN:  1. Patient with long-standing post ablative hypothyroidism, on Synthroid d.a.w.  Her latest thyroid tests are normal on 175 mcg levothyroxine 6 out of 7 days.  Therefore, we changed to 150 mcg daily on 12/09/2017.  She continues on this dose.  Of note, before her pregnancy, she was on 137 mcg daily.  During the pregnancy, she was on 175 mcg daily. - latest thyroid labs reviewed with pt >> normal  - pt feels good on this dose. She has some fatigue, but possibly related to caring to her 45 mo old daughter - we discussed about taking the thyroid hormone every day, with water, >30 minutes before breakfast, separated by >4 hours from acid reflux  medications, calcium, iron, multivitamins. Pt. is taking it correctly. - will check thyroid tests today: TSH and fT4 - If labs are abnormal, she will need to return for repeat TFTs in 1.5 months - OTW, I will see her in  1 year  - time spent with the patient: 15 minutes, of which >50% was spent in obtaining information about her symptoms, reviewing her previous labs, evaluations, and treatments, counseling her about her condition, and developing a plan to further investigate and treat it.  Needs refills for 1 year.   Office Visit on 03/24/2018  Component Date Value Ref Range Status  . TSH 03/24/2018 0.80  0.35 - 4.50 uIU/mL Final  . Free T4 03/24/2018 0.99  0.60 - 1.60 ng/dL Final   Comment: Specimens from patients who are undergoing biotin therapy and /or ingesting biotin supplements may contain high levels of biotin.  The higher biotin concentration in these specimens interferes with this Free T4 assay.  Specimens that contain high levels  of biotin may cause false high results for this Free T4 assay.  Please interpret results in light of the total clinical presentation of the patient.     Normal TFTs.  Philemon Kingdom, MD PhD [facility] Endocrinology

## 2018-03-24 NOTE — Patient Instructions (Addendum)
Please continue Synthroid 150 mcg daily.  Take the thyroid hormone every day, with water, at least 30 minutes before breakfast, separated by at least 4 hours from: - acid reflux medications - calcium - iron - multivitamins  Please stop at the lab.  Please come back for a follow-up appointment in 1 year.

## 2018-03-25 ENCOUNTER — Encounter: Payer: Self-pay | Admitting: Gynecologic Oncology

## 2018-03-25 MED ORDER — SYNTHROID 150 MCG PO TABS: 150.0000 ug | ORAL_TABLET | Freq: Every day | ORAL | 3 refills | Status: DC

## 2018-03-31 ENCOUNTER — Telehealth: Payer: Self-pay | Admitting: Gynecologic Oncology

## 2018-03-31 NOTE — Telephone Encounter (Signed)
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  It is in close proximity to the cervix, and therefore excision of this lesion is not possible without accompanying hysterectomy.  I am recommending surgical excision, as I am concerned there may be occult invasive disease, and therefore a laser procedure without pathologic establishment of negative margins is concerning.  I am recommending robotic assisted total hysterectomy, bilateral salpingectomy, upper vaginectomy.  I explained that this procedure will be associated with some loss of vaginal length, however sexual function should be able to be resumed after healing is complete.  I explained that recurrent dysplasia and potentially malignancy in the vagina is possible after the procedure, and therefore she will continue to require ongoing close surveillance of vaginal cytology.  She is agreeable to proceeding with the surgery.  We will attempt to schedule surgery this coming Tuesday.

## 2018-04-02 ENCOUNTER — Inpatient Hospital Stay (HOSPITAL_COMMUNITY)
Admission: RE | Admit: 2018-04-02 | Discharge: 2018-04-02 | Disposition: A | Discharge status: Home / Self Care | Payer: 59 | Source: Ambulatory Visit

## 2018-04-02 NOTE — Patient Instructions (Signed)
Kelly Mcclure  04/02/2018   Your procedure is scheduled on: 04-06-18  Report to [facility] Main  Entrance  Report to admitting at 1030 AM    Call this number if you have problems the morning of surgery [phone]   Remember: Do not eat food  :After Midnight.  DRINK 2 PRESURGERY ENSURE DRINKS THE NIGHT BEFORE SURGERY AT  1000 PM AND 1 PRESURGERY DRINK THE DAY OF THE PROCEDURE 3 HOURS PRIOR TO SCHEDULED SURGERY. NO SOLIDS AFTER MIDNIGHT THE DAY PRIOR TO THE SURGERY. NOTHING BY MOUTH EXCEPT CLEAR LIQUIDS UNTIL THREE HOURS PRIOR TO SCHEDULED SURGERY. PLEASE FINISH PRESURGERY ENSURE DRINK PER SURGEON ORDER 3 HOURS PRIOR TO SCHEDULED SURGERY TIME WHICH NEEDS TO BE COMPLETED AT 1000 AM.  Eat a light diet the day before surgery.  Examples including soups, broths, toast, yogurt, mashed potatoes.  Things to avoid include carbonated beverages (fizzy beverages), raw fruits and raw vegetables, or beans.   If your bowels are filled with gas, your surgeon will have difficulty visualizing your pelvic organs which increases your surgical risks.  CLEAR LIQUID DIET   Foods Allowed                                                                     Foods Excluded  Coffee and tea, regular and decaf                             liquids that you cannot  Plain Jell-O in any flavor                                             see through such as: Fruit ices (not with fruit pulp)                                     milk, soups, orange juice  Iced Popsicles                                    All solid food  Cranberry, grape and apple juices Sports drinks like Gatorade Lightly seasoned clear broth or consume(fat free) Sugar, honey syrup  Sample Menu Breakfast                                Lunch                                     Supper Cranberry juice                    Beef broth  Chicken broth Jell-O                                     Grape juice                            Apple juice Coffee or tea                        Jell-O                                      Popsicle                                                Coffee or tea                        Coffee or tea  _____________________________________________________________________     Take these medicines the morning of surgery with A SIP OF WATER: ALBUTEROL INHALER IF NEEDED AND BRING INHALER, LAMOTRIGINE (LAMICTAL XR), LEVETIRACETAM (KEPPRA), LORATADINE (CLARITIN), SYNTHROID                               You may not have any metal on your body including hair pins and              piercings  Do not wear jewelry, make-up, lotions, powders or perfumes, deodorant             Do not wear nail polish.  Do not shave  48 hours prior to surgery.             Do not bring valuables to the hospital. [place].  Contacts, dentures or bridgework may not be worn into surgery.  Leave suitcase in the car. After surgery it may be brought to your room.                 Please read over the following fact sheets you were given: _____________________________________________________________________   [facility] - Preparing for Surgery Before surgery, you can play an important role.  Because skin is not sterile, your skin needs to be as free of germs as possible.  You can reduce the number of germs on your skin by washing with CHG (chlorahexidine gluconate) soap before surgery.  CHG is an antiseptic cleaner which kills germs and bonds with the skin to continue killing germs even after washing. Please DO NOT use if you have an allergy to CHG or antibacterial soaps.  If your skin becomes reddened/irritated stop using the CHG and inform your nurse when you arrive at Short Stay. Do not shave (including legs and underarms) for at least 48 hours prior to the first CHG shower.  You may shave your face/neck. Please follow these instructions carefully:  1.   Shower with CHG Soap the night before surgery and the  morning of Surgery.  2.  If you choose to wash your hair, wash your hair  first as usual with your  normal  shampoo.  3.  After you shampoo, rinse your hair and body thoroughly to remove the  shampoo.                           4.  Use CHG as you would any other liquid soap.  You can apply chg directly  to the skin and wash                       Gently with a scrungie or clean washcloth.  5.  Apply the CHG Soap to your body ONLY FROM THE NECK DOWN.   Do not use on face/ open                           Wound or open sores. Avoid contact with eyes, ears mouth and genitals (private parts).                       Wash face,  Genitals (private parts) with your normal soap.             6.  Wash thoroughly, paying special attention to the area where your surgery  will be performed.  7.  Thoroughly rinse your body with warm water from the neck down.  8.  DO NOT shower/wash with your normal soap after using and rinsing off  the CHG Soap.                9.  Pat yourself dry with a clean towel.            10.  Wear clean pajamas.            11.  Place clean sheets on your bed the night of your first shower and do not  sleep with pets. Day of Surgery : Do not apply any lotions/deodorants the morning of surgery.  Please wear clean clothes to the hospital/surgery center.  FAILURE TO FOLLOW THESE INSTRUCTIONS MAY RESULT IN THE CANCELLATION OF YOUR SURGERY PATIENT SIGNATURE_________________________________  NURSE SIGNATURE__________________________________  ________________________________________________________________________   [name]  An incentive spirometer is a tool that can help keep your lungs clear and active. This tool measures how well you are filling your lungs with each breath. Taking long deep breaths may help reverse or decrease the chance of developing breathing (pulmonary) problems (especially infection) following:  A long  period of time when you are unable to move or be active. BEFORE THE PROCEDURE   If the spirometer includes an indicator to show your best effort, your nurse or respiratory therapist will set it to a desired goal.  If possible, sit up straight or lean slightly forward. Try not to slouch.  Hold the incentive spirometer in an upright position. INSTRUCTIONS FOR USE  1. Sit on the edge of your bed if possible, or sit up as far as you can in bed or on a chair. 2. Hold the incentive spirometer in an upright position. 3. Breathe out normally. 4. Place the mouthpiece in your mouth and seal your lips tightly around it. 5. Breathe in slowly and as deeply as possible, raising the piston or the ball toward the top of the column. 6. Hold your breath for 3-5 seconds or for as long as possible. Allow the piston or ball to fall to the bottom of the column. 7. Remove the  mouthpiece from your mouth and breathe out normally. 8. Rest for a few seconds and repeat Steps 1 through 7 at least 10 times every 1-2 hours when you are awake. Take your time and take a few normal breaths between deep breaths. 9. The spirometer may include an indicator to show your best effort. Use the indicator as a goal to work toward during each repetition. 10. After each set of 10 deep breaths, practice coughing to be sure your lungs are clear. If you have an incision (the cut made at the time of surgery), support your incision when coughing by placing a pillow or rolled up towels firmly against it. Once you are able to get out of bed, walk around indoors and cough well. You may stop using the incentive spirometer when instructed by your caregiver.  RISKS AND COMPLICATIONS  Take your time so you do not get dizzy or light-headed.  If you are in pain, you may need to take or ask for pain medication before doing incentive spirometry. It is harder to take a deep breath if you are having pain. AFTER USE  Rest and breathe slowly and  easily.  It can be helpful to keep track of a log of your progress. Your caregiver can provide you with a simple table to help with this. If you are using the spirometer at home, follow these instructions: [place] IF:   You are having difficultly using the spirometer.  You have trouble using the spirometer as often as instructed.  Your pain medication is not giving enough relief while using the spirometer.  You develop fever of 100.5 F (38.1 C) or higher. SEEK IMMEDIATE MEDICAL CARE IF:   You cough up bloody sputum that had not been present before.  You develop fever of 102 F (38.9 C) or greater.  You develop worsening pain at or near the incision site. MAKE SURE YOU:   Understand these instructions.  Will watch your condition.  Will get help right away if you are not doing well or get worse. Document Released: 12/01/2006 Document Revised: 10/13/2011 Document Reviewed: 02/01/2007 [employer] Patient Information 2014 [employer], [place].   ________________________________________________________________________  WHAT IS A BLOOD TRANSFUSION? Blood Transfusion Information  A transfusion is the replacement of blood or some of its parts. Blood is made up of multiple cells which provide different functions.  Red blood cells carry oxygen and are used for blood loss replacement.  White blood cells fight against infection.  Platelets control bleeding.  Plasma helps clot blood.  Other blood products are available for specialized needs, such as hemophilia or other clotting disorders. BEFORE THE TRANSFUSION  Who gives blood for transfusions?   Healthy volunteers who are fully evaluated to make sure their blood is safe. This is blood bank blood. Transfusion therapy is the safest it has ever been in the practice of medicine. Before blood is taken from a donor, a complete history is taken to make sure that person has no history of diseases nor engages in risky social  behavior (examples are intravenous drug use or sexual activity with multiple partners). The donor's travel history is screened to minimize risk of transmitting infections, such as malaria. The donated blood is tested for signs of infectious diseases, such as HIV and hepatitis. The blood is then tested to be sure it is compatible with you in order to minimize the chance of a transfusion reaction. If you or a relative donates blood, this is often done in anticipation of surgery  and is not appropriate for emergency situations. It takes many days to process the donated blood. RISKS AND COMPLICATIONS Although transfusion therapy is very safe and saves many lives, the main dangers of transfusion include:   Getting an infectious disease.  Developing a transfusion reaction. This is an allergic reaction to something in the blood you were given. Every precaution is taken to prevent this. The decision to have a blood transfusion has been considered carefully by your caregiver before blood is given. Blood is not given unless the benefits outweigh the risks. AFTER THE TRANSFUSION  Right after receiving a blood transfusion, you will usually feel much better and more energetic. This is especially true if your red blood cells have gotten low (anemic). The transfusion raises the level of the red blood cells which carry oxygen, and this usually causes an energy increase.  The nurse administering the transfusion will monitor you carefully for complications. HOME CARE INSTRUCTIONS  No special instructions are needed after a transfusion. You may find your energy is better. Speak with your caregiver about any limitations on activity for underlying diseases you may have. SEEK MEDICAL CARE IF:   Your condition is not improving after your transfusion.  You develop redness or irritation at the intravenous (IV) site. SEEK IMMEDIATE MEDICAL CARE IF:  Any of the following symptoms occur over the next 12 hours:  Shaking  chills.  You have a temperature by mouth above 102 F (38.9 C), not controlled by medicine.  Chest, back, or muscle pain.  People around you feel you are not acting correctly or are confused.  Shortness of breath or difficulty breathing.  Dizziness and fainting.  You get a rash or develop hives.  You have a decrease in urine output.  Your urine turns a dark color or changes to pink, red, or brown. Any of the following symptoms occur over the next 10 days:  You have a temperature by mouth above 102 F (38.9 C), not controlled by medicine.  Shortness of breath.  Weakness after normal activity.  The white part of the eye turns yellow (jaundice).  You have a decrease in the amount of urine or are urinating less often.  Your urine turns a dark color or changes to pink, red, or brown. Document Released: 07/18/2000 Document Revised: 10/13/2011 Document Reviewed: 03/06/2008 [facility] Patient Information 2014 [place], [place].  _______________________________________________________________________

## 2018-04-03 ENCOUNTER — Encounter: Payer: Self-pay | Admitting: Gynecologic Oncology

## 2018-04-09 NOTE — Patient Instructions (Addendum)
Kelly Mcclure  04/09/2018   Your procedure is scheduled on: 04-20-18  Report to [facility] Main  Entrance  Report to admitting at 800 AM    Call this number if you have problems the morning of surgery [phone]   Remember: Do not eat food :After Midnight.  NO SOLID FOOD AFTER MIDNIGHT THE NIGHT PRIOR TO SURGERY. NOTHING BY MOUTH EXCEPT CLEAR LIQUIDS UNTIL 3 HOURS PRIOR TO [place] SURGERY. PLEASE FINISH ENSURE DRINK PER SURGEON ORDER 3 HOURS PRIOR TO SCHEDULED SURGERY TIME WHICH NEEDS TO BE COMPLETED AT 745 AM.  Eat a light diet the day before surgery.  Examples including soups, broths, toast, yogurt, mashed potatoes.  Things to avoid include carbonated beverages (fizzy beverages), raw fruits and raw vegetables, or beans.     CLEAR LIQUID DIET   Foods Allowed                                                                     Foods Excluded  Coffee and tea, regular and decaf                             liquids that you cannot  Plain Jell-O in any flavor                                             see through such as: Fruit ices (not with fruit pulp)                                     milk, soups, orange juice  Iced Popsicles                                    All solid food                               Cranberry, grape and apple juices Sports drinks like Gatorade Lightly seasoned clear broth or consume(fat free) Sugar, honey syrup  Sample Menu Breakfast                                Lunch                                     Supper Cranberry juice                    Beef broth                            Chicken broth Jell-O  Grape juice                           Apple juice Coffee or tea                        Jell-O                                      Popsicle                                                Coffee or tea                        Coffee or  tea  _____________________________________________________________________    If your bowels are filled with gas, your surgeon will have difficulty visualizing your pelvic organs which increases your surgical risks.  Take these medicines the morning of surgery with A SIP OF WATER:albuterol inhlalr if needed and bring inhlaer, lamotrigen (lamictal xr), levetiracetam (keppra), loratadine (claritin), synthroid              You may not have any metal on your body including hair pins and              piercings  Do not wear jewelry, make-up, lotions, powders or perfumes, deodorant             Do not wear nail polish.  Do not shave  48 hours prior to surgery.              Men may shave face and neck.   Do not bring valuables to the hospital. Kelly Mcclure.  Contacts, dentures or bridgework may not be worn into surgery.  Leave suitcase in the car. After surgery it may be brought to your room.                  Please read over the following fact sheets you were given: _____________________________________________________________________   [facility] - Preparing for Surgery Before surgery, you can play an important role.  Because skin is not sterile, your skin needs to be as free of germs as possible.  You can reduce the number of germs on your skin by washing with CHG (chlorahexidine gluconate) soap before surgery.  CHG is an antiseptic cleaner which kills germs and bonds with the skin to continue killing germs even after washing. Please DO NOT use if you have an allergy to CHG or antibacterial soaps.  If your skin becomes reddened/irritated stop using the CHG and inform your nurse when you arrive at Short Stay. Do not shave (including legs and underarms) for at least 48 hours prior to the first CHG shower.  You may shave your face/neck. Please follow these instructions carefully:  1.  Shower with CHG Soap the night before surgery and the  morning of  Surgery.  2.  If you choose to wash your hair, wash your hair first as usual with your  normal  shampoo.  3.  After you shampoo, rinse your hair and body thoroughly to remove the  shampoo.  4.  Use CHG as you would any other liquid soap.  You can apply chg directly  to the skin and wash                       Gently with a scrungie or clean washcloth.  5.  Apply the CHG Soap to your body ONLY FROM THE NECK DOWN.   Do not use on face/ open                           Wound or open sores. Avoid contact with eyes, ears mouth and genitals (private parts).                       Wash face,  Genitals (private parts) with your normal soap.             6.  Wash thoroughly, paying special attention to the area where your surgery  will be performed.  7.  Thoroughly rinse your body with warm water from the neck down.  8.  DO NOT shower/wash with your normal soap after using and rinsing off  the CHG Soap.                9.  Pat yourself dry with a clean towel.            10.  Wear clean pajamas.            11.  Place clean sheets on your bed the night of your first shower and do not  sleep with pets. Day of Surgery : Do not apply any lotions/deodorants the morning of surgery.  Please wear clean clothes to the hospital/surgery center.  FAILURE TO FOLLOW THESE INSTRUCTIONS MAY RESULT IN THE CANCELLATION OF YOUR SURGERY PATIENT SIGNATURE_________________________________  NURSE SIGNATURE__________________________________  ________________________________________________________________________   Kelly Mcclure  An incentive spirometer is a tool that can help keep your lungs clear and active. This tool measures how well you are filling your lungs with each breath. Taking long deep breaths may help reverse or decrease the chance of developing breathing (pulmonary) problems (especially infection) following:  A long period of time when you are unable to move or be active. BEFORE  THE PROCEDURE   If the spirometer includes an indicator to show your best effort, your nurse or respiratory therapist will set it to a desired goal.  If possible, sit up straight or lean slightly forward. Try not to slouch.  Hold the incentive spirometer in an upright position. INSTRUCTIONS FOR USE  1. Sit on the edge of your bed if possible, or sit up as far as you can in bed or on a chair. 2. Hold the incentive spirometer in an upright position. 3. Breathe out normally. 4. Place the mouthpiece in your mouth and seal your lips tightly around it. 5. Breathe in slowly and as deeply as possible, raising the piston or the ball toward the top of the column. 6. Hold your breath for 3-5 seconds or for as long as possible. Allow the piston or ball to fall to the bottom of the column. 7. Remove the mouthpiece from your mouth and breathe out normally. 8. Rest for a few seconds and repeat Steps 1 through 7 at least 10 times every 1-2 hours when you are awake. Take your time and take a few normal breaths between deep breaths. 9. The spirometer may include an indicator to show  your best effort. Use the indicator as a goal to work toward during each repetition. 10. After each set of 10 deep breaths, practice coughing to be sure your lungs are clear. If you have an incision (the cut made at the time of surgery), support your incision when coughing by placing a pillow or rolled up towels firmly against it. Once you are able to get out of bed, walk around indoors and cough well. You may stop using the incentive spirometer when instructed by your caregiver.  RISKS AND COMPLICATIONS  Take your time so you do not get dizzy or light-headed.  If you are in pain, you may need to take or ask for pain medication before doing incentive spirometry. It is harder to take a deep breath if you are having pain. AFTER USE  Rest and breathe slowly and easily.  It can be helpful to keep track of a log of your progress.  Your caregiver can provide you with a simple table to help with this. If you are using the spirometer at home, follow these instructions: [place] IF:   You are having difficultly using the spirometer.  You have trouble using the spirometer as often as instructed.  Your pain medication is not giving enough relief while using the spirometer.  You develop fever of 100.5 F (38.1 C) or higher. SEEK IMMEDIATE MEDICAL CARE IF:   You cough up bloody sputum that had not been present before.  You develop fever of 102 F (38.9 C) or greater.  You develop worsening pain at or near the incision site. MAKE SURE YOU:   Understand these instructions.  Will watch your condition.  Will get help right away if you are not doing well or get worse. Document Released: 12/01/2006 Document Revised: 10/13/2011 Document Reviewed: 02/01/2007 Exit[employer] Patient Information 2014 [employer], [place].   ________________________________________________________________________  WHAT IS A BLOOD TRANSFUSION? Blood Transfusion Information  A transfusion is the replacement of blood or some of its parts. Blood is made up of multiple cells which provide different functions.  Red blood cells carry oxygen and are used for blood loss replacement.  White blood cells fight against infection.  Platelets control bleeding.  Plasma helps clot blood.  Other blood products are available for specialized needs, such as hemophilia or other clotting disorders. BEFORE THE TRANSFUSION  Who gives blood for transfusions?   Healthy volunteers who are fully evaluated to make sure their blood is safe. This is blood bank blood. Transfusion therapy is the safest it has ever been in the practice of medicine. Before blood is taken from a donor, a complete history is taken to make sure that person has no history of diseases nor engages in risky social behavior (examples are intravenous drug use or sexual activity with multiple  partners). The donor's travel history is screened to minimize risk of transmitting infections, such as malaria. The donated blood is tested for signs of infectious diseases, such as HIV and hepatitis. The blood is then tested to be sure it is compatible with you in order to minimize the chance of a transfusion reaction. If you or a relative donates blood, this is often done in anticipation of surgery and is not appropriate for emergency situations. It takes many days to process the donated blood. RISKS AND COMPLICATIONS Although transfusion therapy is very safe and saves many lives, the main dangers of transfusion include:   Getting an infectious disease.  Developing a transfusion reaction. This is an allergic reaction to  something in the blood you were given. Every precaution is taken to prevent this. The decision to have a blood transfusion has been considered carefully by your caregiver before blood is given. Blood is not given unless the benefits outweigh the risks. AFTER THE TRANSFUSION  Right after receiving a blood transfusion, you will usually feel much better and more energetic. This is especially true if your red blood cells have gotten low (anemic). The transfusion raises the level of the red blood cells which carry oxygen, and this usually causes an energy increase.  The nurse administering the transfusion will monitor you carefully for complications. HOME CARE INSTRUCTIONS  No special instructions are needed after a transfusion. You may find your energy is better. Speak with your caregiver about any limitations on activity for underlying diseases you may have. SEEK MEDICAL CARE IF:   Your condition is not improving after your transfusion.  You develop redness or irritation at the intravenous (IV) site. SEEK IMMEDIATE MEDICAL CARE IF:  Any of the following symptoms occur over the next 12 hours:  Shaking chills.  You have a temperature by mouth above 102 F (38.9 C), not  controlled by medicine.  Chest, back, or muscle pain.  People around you feel you are not acting correctly or are confused.  Shortness of breath or difficulty breathing.  Dizziness and fainting.  You get a rash or develop hives.  You have a decrease in urine output.  Your urine turns a dark color or changes to pink, red, or brown. Any of the following symptoms occur over the next 10 days:  You have a temperature by mouth above 102 F (38.9 C), not controlled by medicine.  Shortness of breath.  Weakness after normal activity.  The white part of the eye turns yellow (jaundice).  You have a decrease in the amount of urine or are urinating less often.  Your urine turns a dark color or changes to pink, red, or brown. Document Released: 07/18/2000 Document Revised: 10/13/2011 Document Reviewed: 03/06/2008 [facility] Patient Information 2014 [place], [place].  _______________________________________________________________________

## 2018-04-13 ENCOUNTER — Encounter: Payer: Self-pay | Admitting: Gynecologic Oncology

## 2018-04-14 ENCOUNTER — Encounter (HOSPITAL_COMMUNITY): Payer: Self-pay

## 2018-04-14 ENCOUNTER — Other Ambulatory Visit: Payer: Self-pay

## 2018-04-14 ENCOUNTER — Encounter (HOSPITAL_COMMUNITY)
Admission: RE | Admit: 2018-04-14 | Discharge: 2018-04-14 | Disposition: A | Discharge status: Home / Self Care | Payer: 59 | Source: Ambulatory Visit | Attending: Gynecologic Oncology | Admitting: Gynecologic Oncology

## 2018-04-14 DIAGNOSIS — Z01812 Encounter for preprocedural laboratory examination: Secondary | ICD-10-CM | POA: Insufficient documentation

## 2018-04-14 HISTORY — DX: Cough, unspecified: R05.9

## 2018-04-14 HISTORY — DX: Cough: R05

## 2018-04-14 LAB — CBC
HCT: 44.5 % (ref 36.0–46.0)
HEMOGLOBIN: 14.4 g/dL (ref 12.0–15.0)
MCH: 26 pg (ref 26.0–34.0)
MCHC: 32.4 g/dL (ref 30.0–36.0)
MCV: 80.3 fL (ref 78.0–100.0)
Platelets: 273 10*3/uL (ref 150–400)
RBC: 5.54 MIL/uL — ABNORMAL HIGH (ref 3.87–5.11)
RDW: 13.6 % (ref 11.5–15.5)
WBC: 8 10*3/uL (ref 4.0–10.5)

## 2018-04-14 LAB — URINALYSIS, ROUTINE W REFLEX MICROSCOPIC
Bilirubin Urine: NEGATIVE
Glucose, UA: NEGATIVE mg/dL
Hgb urine dipstick: NEGATIVE
KETONES UR: NEGATIVE mg/dL
Nitrite: NEGATIVE
PROTEIN: NEGATIVE mg/dL
Specific Gravity, Urine: 1.024 (ref 1.005–1.030)
pH: 5 (ref 5.0–8.0)

## 2018-04-14 LAB — PREGNANCY, URINE: Preg Test, Ur: NEGATIVE

## 2018-04-14 LAB — COMPREHENSIVE METABOLIC PANEL
ALK PHOS: 73 U/L (ref 38–126)
ALT: 19 U/L (ref 0–44)
AST: 20 U/L (ref 15–41)
Albumin: 4.4 g/dL (ref 3.5–5.0)
Anion gap: 9 (ref 5–15)
BUN: 8 mg/dL (ref 6–20)
CHLORIDE: 109 mmol/L (ref 98–111)
CO2: 22 mmol/L (ref 22–32)
CREATININE: 0.61 mg/dL (ref 0.44–1.00)
Calcium: 9.3 mg/dL (ref 8.9–10.3)
Glucose, Bld: 90 mg/dL (ref 70–99)
Potassium: 4.1 mmol/L (ref 3.5–5.1)
Sodium: 140 mmol/L (ref 135–145)
Total Bilirubin: 0.6 mg/dL (ref 0.3–1.2)
Total Protein: 7.5 g/dL (ref 6.5–8.1)

## 2018-04-15 ENCOUNTER — Encounter: Payer: Self-pay | Admitting: Gynecology

## 2018-04-15 LAB — URINE CULTURE: CULTURE: NO GROWTH

## 2018-04-15 LAB — ABO/RH: ABO/RH(D): A POS

## 2018-04-16 ENCOUNTER — Telehealth: Payer: Self-pay | Admitting: Gynecologic Oncology

## 2018-04-16 NOTE — Telephone Encounter (Signed)
Left message for patient asking her to please call the office to discuss steps moving forward.

## 2018-04-16 NOTE — Telephone Encounter (Signed)
Patient returned call to the office. Patient stating she would like to cancel her surgery for Tues at this time and she will follow up with Dr. Fermin Schwab in the office on Sept 24.  No other requests or concerns voiced.  Advised to call for any needs.

## 2018-04-20 ENCOUNTER — Ambulatory Visit (HOSPITAL_COMMUNITY): Admission: RE | Admit: 2018-04-20 | Payer: 59 | Source: Ambulatory Visit | Admitting: Gynecologic Oncology

## 2018-04-20 LAB — TYPE AND SCREEN
ABO/RH(D): A POS
Antibody Screen: NEGATIVE

## 2018-04-20 SURGERY — HYSTERECTOMY, TOTAL, ROBOT-ASSISTED
Anesthesia: General | Laterality: Bilateral

## 2018-04-27 ENCOUNTER — Encounter: Payer: Self-pay | Admitting: Gynecology

## 2018-04-27 ENCOUNTER — Inpatient Hospital Stay: Payer: 59 | Attending: Gynecologic Oncology | Admitting: Gynecology

## 2018-04-27 VITALS — BP 118/89 | HR 89 | Temp 98.6°F | Resp 20 | Ht 65.5 in | Wt 160.7 lb

## 2018-04-27 DIAGNOSIS — N893 Dysplasia of vagina, unspecified: Secondary | ICD-10-CM | POA: Diagnosis not present

## 2018-04-27 DIAGNOSIS — C538 Malignant neoplasm of overlapping sites of cervix uteri: Secondary | ICD-10-CM

## 2018-04-27 NOTE — Progress Notes (Signed)
Consult Note: Gyn-Onc   Kelly Mcclure 37 y.o. female  Chief Complaint  Patient presents with  . Malignant neoplasm of overlapping sites of cervix [facility])   Assessment :  Stage IA 1 cervical cancer 2011.  Newly recognized lesion in the posterior upper vagina very suspicious for invasive carcinoma.  Additional biopsies are pending.  Plan: Biopsies are pending.  I am suspicious that she may have an early invasive lesion given the friable lesion in the exophytic nature of what I was able to visualize.  Marland Kitchen At this juncture I am in agreement with Dr. [name] that hysterectomy and upper vaginectomy would be the most appropriate treatment.  (I do not believe the exophytic lesion can be safely resected with an adequate surgical margin and at the same time preserve the uterine cervix.  The patient is quite comfortable with the idea of going ahead with hysterectomy and does not wish to preserve fertility.  We will await today's biopsy reports but at the same time start planning surgery as noted above under the direction of Dr. Denman George..   Interval History: Returns today as scheduled seeking second opinion regarding ongoing management.  Patient underwent a cold knife conization February 18, 2018.  The cone specimen showed no evidence of dysplasia or cancer.  However, on follow-up exam an exophytic lesion of the posterior vagina was identified.  Biopsy showed high-grade squamous dysplasia but invasive carcinoma could not be ruled out.  A robotic assisted total hysterectomy bilateral salpingectomy and upper vaginectomy was recommended by Dr. Denman George.  The patient asked to see me regarding my thoughts as how to proceed and what her options are.  She reports that she is resumed having cyclic menstrual periods since the conization.  HPI: Patient had an abnormal Pap smear in 2011. 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 Surgical margins were positive.  Therefore, the patient underwent a cold knife conization on 04/01/2010. Pathology from that specimen showed severe dysplasia with negative margins. The patient was subsequently followed with Pap smears. A pap in March 2012 was ascus. ECC was negative. Repeat ECC in June 2012 was negative. Pap smear in 03/03/2012 showed high-grade SIL and again ECC was negative. Conservative followup was recommended.  Repeat colposcopy in December 2013 identified a lesion in the posterior vaginal fornix that was biopsied. Final pathology only showed atypia. A pap smear on that same day only showed LSIL.  A Pap smear on 10/01/2012 showed high-grade SIL.  Pap smear in March was low grade SIL. Patient subsequently underwent cervical dilatation, endocervical curettage, and vaginal biopsy (03/01/2013). The only abnormality found was atypia on vaginal biopsy.  October 2014 Pap smear with low-grade SIL.  March 21, 2016 Pap smear showed HGSIL.  Endocervical curettage showed benign endocervical glands.  Pap smear July 15, 2016 showed HGSIL.  Endocervical curettage showed benign endocervical glands.  January 13, 2018 Pap smear showed HGSIL.  Biopsy of an exophytic lesion showed high-grade squamous dysplasia.  July 2019 the patient underwent cold knife conization.  The specimen did not contain any evidence of dysplasia or cancer.  Subsequently on postoperative exam a lesion was noted in the posterior vagina.      Review of Systems:10 point review of systems is negative except as noted in interval history.   Vitals: Blood pressure 118/89, pulse 89, temperature 98.6 F (37 C), temperature source Oral, resp. rate 20, height 5' 5.5" (1.664 m), weight 160 lb 11.2 oz (72.9 kg), SpO2  97 %, not currently breastfeeding.  Physical Exam: General : The patient is a healthy woman in no acute distress.  HEENT: normocephalic, extraoccular movements normal; neck is supple without thyromegally  Lynphnodes: Supraclavicular and  inguinal nodes not enlarged  Abdomen: Soft, non-tender, no ascites, no organomegally, no masses, no hernias  Pelvic:  EGBUS: Normal female  Vagina: There is a friable exophytic 2 cm lesion in the posterior vagina which is suspicious for invasive carcinoma.   Urethra and Bladder: Normal, non-tender  Cervix: Flush with the vaginal fornices the office cannot be visualized. Uterus: Anterior normal shape size and consistency Bi-manual examination: Non-tender; no adenxal masses or nodularity  Lower extremities: No edema or varicosities. Normal range of motion    Procedure note: Colposcopy is performed after applying acetic acid.  In the course of applying acetic acid a friable area began to bleed.  Colposcopy was somewhat difficult because the narrowing of the upper vagina and the cervix being flush with the vaginal fornices.   Biopsies are obtained with the intent of obtaining some cervical stroma.     Allergies  Allergen Reactions  . Sulfa Antibiotics Swelling    Tongue swelling, throat irritated  . Adhesive [Tape] Other (See Comments)    "skin burn"  . Other Swelling and Other (See Comments)    Magic mouth wash/ causes swelling of the tongue  . Relpax [Eletriptan Hydrobromide] Other (See Comments)    Stroke-like symptoms  . Ultram [Tramadol] Other (See Comments)    2004--  Per pt had seizure due to interaction with other medication she was taking at the time    Past Medical History:  Diagnosis Date  . Cancer [facility]) DX 2011   CERVICAL   . Cough    WITH SEASONAL ALLEGIES, NONPRODUCTIVE LAST 2 DAYS  . H/O radioactive iodine thyroid ablation 12/2009  . 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 History of cervical dysplasia   . History of hyperthyroidism    per pt dx 2000 -- s/p RAI  2011  . History of kidney stones 2011  . Hx of varicella   . Hypothyroidism, postradioiodine therapy    endocrinology-  dr Cruzita Lederer  . Migraines   . Mild persistent asthma 01/19/2018    followed by dr Verlin Fester ([facility]) MILD (SUBCLINICAL PER PT)  . 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 Vaginal Pap smear, abnormal     Past Surgical History:  Procedure Laterality Date  . ABDOMINAL CERCLAGE N/A 11/19/2016   Procedure: LAPAROSCOPIC TRANSABDOMINAL CERVICAL-ISTHMUSx2;  Surgeon: Governor Specking, MD;  Location: [place] [facility];  Service: Gynecology;  Laterality: N/A;  with ultrasound guidance. Start laparoscopically HOLD OPEN ABDOMINAL ITEMS   . CERCLAGE LAPAROSCOPIC ABDOMINAL N/A 11/19/2016   Procedure: CERCLAGE LAPAROSCOPIC ABDOMINAL;  Surgeon: Governor Specking, MD;  Location: [place] [facility];  Service: Gynecology;  Laterality: N/A;  . CERVICAL CONIZATION W/BX N/A 02/18/2018   Procedure: COLD KNIFE CONIZATION OF CERVIX WITH POSSIBLE BIOPSY;  Surgeon: [name], MD;  Location: [place];  Service: Gynecology;  Laterality: N/A;  . CESAREAN SECTION N/A 05/27/2017   Procedure: Primary CESAREAN SECTION;  Surgeon: Brien Few, MD;  Location: [place];  Service: Obstetrics;  Laterality: N/A;  EDD: 11/4/18Allergy: Adhesive, Relpax, Sulfa, Ultram  . COLD KNIFE CONIZATION  03-29-2010   dr  taavon  [place]  . DILATION AND CURETTAGE OF UTERUS  2009  . DILATION AND CURETTAGE OF UTERUS N/A 03/01/2013   Procedure: CERVICAL DILATATION AND ENDOCERVICAL CURRETAGE AND VAAGINAL BIOPIES;  Surgeon: Alvino Chapel, MD;  Location: [facility];  Service: Gynecology;  Laterality: N/A;  . LEEP  02/25/2010;  12/ 2012  dr Ronita Hipps office  . NASAL SEPTUM SURGERY  2006   Repair of deviated septum  . SIGMOIDOSCOPY  2009  . TONSILLECTOMY AND ADENOIDECTOMY  1995    Current Outpatient Medications  Medication Sig Dispense Refill  . albuterol (PROVENTIL HFA;VENTOLIN HFA) 108 (90 Base) MCG/ACT inhaler Inhale 2 puffs into the lungs  every 6 (six) hours as needed for wheezing or shortness of breath. (Patient taking differently: Inhale 2 puffs into the lungs as needed for wheezing or shortness of breath. ) 1 Inhaler 1  . ALPRAZolam (XANAX) 0.5 MG tablet Take 1 tablet (0.5 mg total) by mouth at bedtime as needed for anxiety. 30 tablet 3  . butalbital-acetaminophen-caffeine (FIORICET, ESGIC) 50-325-40 MG tablet Take 1-2 tablets by mouth every 4 (four) hours as needed for migraine.     . LamoTRIgine (LAMICTAL XR) 200 MG TB24 24 hour tablet Take 2 tablets (400 mg total) by mouth 2 (two) times daily. (Patient taking differently: Take 400 mg by mouth 2 (two) times daily. Takes 1000 and 2200) 360 tablet 4  . LamoTRIgine 100 MG TB24 24 hour tablet Take 1 tablet (100 mg total) by mouth at bedtime. (Patient taking differently: Take 100 mg by mouth at bedtime. ) 30 tablet 11  . levETIRAcetam (KEPPRA) 750 MG tablet Take 2 tablets (1,500 mg total) by mouth 2 (two) times daily. (Patient taking differently: Take 1,500 mg by mouth 2 (two) times daily. ) 360 tablet 4  . loratadine (CLARITIN) 10 MG tablet Take 10 mg by mouth every morning.     Marland Kitchen SYNTHROID 150 MCG tablet Take 1 tablet (150 mcg total) by mouth daily before breakfast. 90 tablet 3  . folic acid (FOLVITE) 1 MG tablet Take 4 mg by mouth daily.    . montelukast (SINGULAIR) 10 MG tablet TAKE 1 TABLET BY MOUTH EVERYDAY AT BEDTIME (Patient not taking: Reported on 04/01/2018) 30 tablet 0   No current facility-administered medications for this visit.     Social History   Socioeconomic History  . Marital status: Single    Spouse name: Not on file  . Number of children: Not on file  . Years of education: Not on file  . Highest education level: Not on file  Occupational History  . Not on file  Social Needs  . Financial resource strain: Not on file  . Food insecurity:    Worry: Not on file    Inability: Not on file  . Transportation needs:    Medical: Not on file    Non-medical: Not  on file  Tobacco Use  . Smoking status: Never Smoker  . Smokeless tobacco: Never Used  Substance and Sexual Activity  . Alcohol use: Not Currently    Comment: SELDOM, NONE IN  10 MONTHS  . Drug use: No  . Sexual activity: Yes    Birth control/protection: None  Lifestyle  . Physical activity:    Days per week: Not on file    Minutes per session: Not on file  . Stress: Not on file  Relationships  . Social connections:    Talks on phone: Not on file    Gets together: Not on file  Attends religious service: Not on file    Active member of club or organization: Not on file    Attends meetings of clubs or organizations: Not on file    Relationship status: Not on file  . Intimate partner violence:    Fear of current or ex partner: Not on file    Emotionally abused: Not on file    Physically abused: Not on file    Forced sexual activity: Not on file  Other Topics Concern  . Not on file  Social History Narrative  . Not on file    Family History  Problem Relation Age of Onset  . Leukemia Other   . Diabetes Other   . Diabetes Mother   . Skin cancer Mother   . Hyperlipidemia Mother   . Hypertension Father   . Asthma Father   . Skin cancer Maternal Aunt   . Multiple myeloma Maternal Aunt   . Testicular cancer Maternal Uncle   . Hyperlipidemia Maternal Grandmother   . AAA (abdominal aortic aneurysm) Maternal Grandmother   . COPD Maternal Grandmother   . Prostate cancer Maternal Grandfather   . AAA (abdominal aortic aneurysm) Maternal Grandfather       Marti Sleigh, MD 04/27/2018, 12:51 PM      Consult Note: Gyn-Onc

## 2018-04-27 NOTE — Addendum Note (Signed)
Addended by: [name] D on: 04/27/2018 03:07 PM   Modules accepted: Orders

## 2018-04-27 NOTE — Patient Instructions (Signed)
We will contact you with the results of your biopsy from today.  You may experience discharge like coffee ground from the medication used to stop the bleeding.  Please call for any needs or concerns.

## 2018-04-28 ENCOUNTER — Telehealth: Payer: Self-pay | Admitting: Gynecologic Oncology

## 2018-04-28 NOTE — Telephone Encounter (Signed)
Left message for patient on cell phone asking her to please call the office.  Plan to discuss biopsy results.    Spoke with patient about biopsy results along with the recommendation from Dr. Fermin Schwab to proceed with the previous scheduled surgery.  Patient states she would like to call back and discuss in the next day or so.  Results released in Mychart for her review as well.  She asked about the urgency on the issue and advised it would need to be taken care of sooner than later due to the "cannot rule out more severe process."  No other concerns voiced.

## 2018-04-29 ENCOUNTER — Encounter: Payer: Self-pay | Admitting: Gynecologic Oncology

## 2018-05-03 ENCOUNTER — Encounter: Payer: Self-pay | Admitting: Gynecologic Oncology

## 2018-05-03 ENCOUNTER — Inpatient Hospital Stay (HOSPITAL_BASED_OUTPATIENT_CLINIC_OR_DEPARTMENT_OTHER): Payer: 59 | Admitting: Gynecologic Oncology

## 2018-05-03 ENCOUNTER — Ambulatory Visit: Payer: 59 | Admitting: Gynecologic Oncology

## 2018-05-03 VITALS — BP 126/86 | HR 85 | Temp 98.3°F | Resp 18 | Ht 65.0 in | Wt 162.0 lb

## 2018-05-03 DIAGNOSIS — N893 Dysplasia of vagina, unspecified: Secondary | ICD-10-CM

## 2018-05-03 DIAGNOSIS — C538 Malignant neoplasm of overlapping sites of cervix uteri: Secondary | ICD-10-CM | POA: Diagnosis not present

## 2018-05-03 NOTE — Progress Notes (Signed)
Follow-up Note: Gyn-Onc   Kelly Mcclure 37 y.o. female  Chief Complaint  Patient presents with  . Malignant neoplasm of overlapping sites of cervix [facility])   Assessment :  Hx of Stage IA 1 cervical cancer 2011. Recent Pap smear showing HGSIL, ectocervical biopsies with CIN 3.   S/p CKC of cervix on 02/18/18. 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  Plan:  Recommendation is for excision.  Due to the concerning appearance of the lesion despite no evidence of invasion on multiple biopsies, excision rather than ablation is recommended.  This plan is agreed upon by my partner, Dr. Fermin Schwab.  Due to the close proximity of the lesion to the posterior lip of the cervix, in addition to her history of prior cervical carcinoma, we are both recommending hysterectomy with upper posterior vaginectomy (and salpingectomy) in order to optimize surgical margins and minimize the likelihood of recurrence.  I discussed that this will result in permanent infertility.  The patient is understanding of this, and is accepting of this.  I did offer her fertility sparing treatment with upper vaginectomy without hysterectomy, however I explained that this could not guarantee the same oncologic outcomes.  The patient does not wish to compromise potential oncologic outcomes in order to preserve fertility at this point in time as she does not plan for future fertility.  I discussed the plan of surgery including removal of the upper posterior vagina cervix, uterus, and fallopian tubes.  I reviewed with diagrams and drawings with her mother and the patient surgical steps.  I discussed the potential risks with this procedure most particularly damage to adjacent structures such as the rectum and bladder.  She has a history of a prior abdominal cerclage and cesarean section both of which place her at increased risk for  bladder injury, and the need to take an adequate posterior vaginal wall margin places her at increased risk for rectal wall injury.  I discussed that postoperatively she may have some dysfunction with evacuation of stool including discomfort due to the proximity of the posterior vaginal cuff incision to the rectal wall.  I discussed that if invasive carcinoma is identified on final pathology and margins are in adequate from resection, she may require additional therapy either with surgical resection of the rectovaginal septum, and or radiation to the pelvis.  I discussed that the vagina will be shortened after the surgical procedure which may alter the the sensation and functionality of intercourse.  I anticipate that she should still be able to achieve orgasm.  I did explain that there will be a requirement for avoidance of vaginal intercourse for at least 3 months postoperatively due to the concern for vaginal cuff dehiscence.  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  I feel that this is reasonable given that on multiple samplings of her vaginal lesion and invasive process is not been identified.  However I do feel that she would benefit from an examination by me prior to surgery to ensure that no change in the lesion had occurred.  HPI: Patient had an abnormal Pap smear in 2011. 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 Surgical margins were positive. Therefore, the patient underwent a cold knife conization on 04/01/2010.  Pathology from that specimen showed severe dysplasia with negative margins. The patient was subsequently followed with Pap smears. A pap in March 2012 was ascus. ECC was negative. Repeat ECC in June 2012 was negative. Pap smear in 03/03/2012 showed high-grade SIL and again ECC was negative.  Conservative followup was recommended.  Repeat colposcopy in December 2013 identified a lesion in the posterior vaginal fornix that was biopsied. Final pathology only showed atypia. A pap smear on that same day only showed LSIL.  A Pap smear on 10/01/2012 showed high-grade SIL.  Pap smear in March was low grade SIL. Patient subsequently underwent cervical dilatation, endocervical curettage, and vaginal biopsy (03/01/2013). The only abnormality found was atypia on vaginal biopsy.  October 2014 Pap smear with low-grade SIL.  March 21, 2016 Pap smear showed HGSIL.  Endocervical curettage showed benign endocervical glands.  Pap smear July 15, 2016 showed HGSIL.  Endocervical curettage showed benign endocervical glands.  January 13, 2018 Pap smear showed HGSIL.  Exam by Dr Fermin Schwab on July 9th showed a friable exophytic lesion on the cervix which was biopsied. Final pathology showed "at least CIN III, invasion could not be ruled out". Colpo had been difficult as the cervix was flush with the vagina and the upper vagina was narrowed.  The patient was interested in a definitive hysterectomy procedure however she required an excisional procedure to rule out invasive carcinoma before having this.  On 02/18/18 she underwent cold knife conization of the cervix in the operating room.  Inspection of the cervix with acetic acid application was grossly unremarkable with no apparent lesions seen.  The upper vagina was also grossly normal.  The cervix was small, fairly flush with the surrounding vagina, however the conization procedure was relatively straightforward.  A 0.9 cm AP dimension by 1.5 cm deep dimension cone was taken without difficulty.  There is minimal bleeding in the operating room.  The patient began experiencing high fevers to 101 and 102 on postoperative day 4 and was prescribed empiric antibiotics. Exam revealed no concerning signs.  Final pathology revealed no residual  dysplasia.  On postoperative examination a friable lesion was seen on the posterior upper vaginal wall measuring approximately 2cm. It was separate to, but abutting, the posterior lip of the cervix. 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   Interval History:  The patient was recommended surgical excision of the lesion, and due to its proximity with the cervix, was recommended hysterectomy (without oophorectomy) at the time of surgery in order to establish adequate margins.  The patient had her surgery scheduled (robotic hysterectomy, upper vaginectomy), however canceled this after she discussed the proposed surgery with her referring doctor, Dr Ronita Hipps, who had concerns about the surgical plan. Dr Ronita Hipps requested that Neddie see my partner, Dr Fermin Schwab for a second opinion.   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  Given his shared concern that this lesion may harbor occult invasive disease, and due to its proximity to the cervix, he agreed with the surgical plan of hysterectomy (not oophorectomy) with salpingectomy and upper vaginectomy.   Review of Systems:10 point review of systems is negative except as noted in interval history.   Vitals: Blood pressure 126/86, pulse 85, temperature 98.3 F (36.8 C), temperature source Oral, resp. rate 18, height '5\' 5"'  (1.651 m), weight 162 lb (73.5 kg), SpO2  98 %, not currently breastfeeding.  Physical Exam: General : The patient is a healthy woman in no acute distress.  HEENT: normocephalic, extraoccular movements normal; neck is supple without thyromegally  Lynphnodes: Supraclavicular and inguinal nodes not enlarged  Abdomen: Soft, non-tender, no ascites, no organomegally, no masses, no hernias  Pelvic:  EGBUS: Normal female  Vagina:  Normal, no lesions  Urethra and Bladder: Normal, non-tender  Cervix: Flush with the vaginal fornices cone bed healing normally.  There is a 1-2cm exophytic area that is friable, it is nodular, it is arising from the posterior upper vagina (separate from the posterior lip of the cervix). Uterus: Anterior normal shape size and consistency Bi-manual examination: Non-tender; no adenxal masses or nodularity  Lower extremities: No edema or varicosities. Normal range of motion   Allergies  Allergen Reactions  . Sulfa Antibiotics Swelling    Tongue swelling, throat irritated  . Adhesive [Tape] Other (See Comments)    "skin burn"  . Other Swelling and Other (See Comments)    Magic mouth wash/ causes swelling of the tongue  . Relpax [Eletriptan Hydrobromide] Other (See Comments)    Stroke-like symptoms  . Ultram [Tramadol] Other (See Comments)    2004--  Per pt had seizure due to interaction with other medication she was taking at the time    Past Medical History:  Diagnosis Date  . Cancer [facility]) DX 2011   CERVICAL   . Cough    WITH SEASONAL ALLEGIES, NONPRODUCTIVE LAST 2 DAYS  . H/O radioactive iodine thyroid ablation 12/2009  . 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 History of cervical dysplasia   . History of hyperthyroidism    per pt dx 2000 -- s/p RAI  2011  . History of kidney stones 2011  . Hx of varicella   . Hypothyroidism, postradioiodine therapy    endocrinology-  dr Cruzita Lederer  . Migraines   . Mild persistent asthma 01/19/2018   followed by dr Verlin Fester (allergy and asthma center) MILD (SUBCLINICAL PER PT)  . 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 Vaginal Pap smear, abnormal     Past Surgical History:  Procedure Laterality Date  . ABDOMINAL CERCLAGE N/A 11/19/2016   Procedure: LAPAROSCOPIC TRANSABDOMINAL  CERVICAL-ISTHMUSx2;  Surgeon: Governor Specking, MD;  Location: [facility];  Service: Gynecology;  Laterality: N/A;  with ultrasound guidance. Start laparoscopically HOLD OPEN ABDOMINAL ITEMS   . CERCLAGE LAPAROSCOPIC ABDOMINAL N/A 11/19/2016   Procedure: CERCLAGE LAPAROSCOPIC ABDOMINAL;  Surgeon: Governor Specking, MD;  Location: [facility];  Service: Gynecology;  Laterality: N/A;  . CERVICAL CONIZATION W/BX N/A 02/18/2018   Procedure: COLD KNIFE CONIZATION OF CERVIX WITH POSSIBLE BIOPSY;  Surgeon: [name], MD;  Location: [place];  Service: Gynecology;  Laterality: N/A;  . CESAREAN SECTION N/A 05/27/2017   Procedure: Primary CESAREAN SECTION;  Surgeon: Brien Few, MD;  Location: [place];  Service: Obstetrics;  Laterality: N/A;  EDD: 11/4/18Allergy: Adhesive, Relpax, Sulfa, Ultram  . COLD KNIFE CONIZATION  03-29-2010   dr Ronita Hipps  [facility]  . DILATION AND CURETTAGE OF UTERUS  2009  . DILATION AND CURETTAGE OF UTERUS N/A 03/01/2013   Procedure: CERVICAL DILATATION AND ENDOCERVICAL CURRETAGE AND VAAGINAL BIOPIES;  Surgeon: Alvino Chapel, MD;  Location: [facility];  Service: Gynecology;  Laterality: N/A;  .  LEEP  02/25/2010;  12/ 2012  dr Ronita Hipps office  . NASAL SEPTUM SURGERY  2006   Repair of deviated septum  . SIGMOIDOSCOPY  2009  . TONSILLECTOMY AND ADENOIDECTOMY  1995    Current Outpatient Medications  Medication Sig Dispense Refill  . albuterol (PROVENTIL HFA;VENTOLIN HFA) 108 (90 Base) MCG/ACT inhaler Inhale 2 puffs into the lungs every 6 (six) hours as needed for wheezing or shortness of breath. (Patient taking differently: Inhale 2 puffs into the lungs as needed for wheezing or shortness of breath. ) 1 Inhaler 1  . ALPRAZolam (XANAX) 0.5 MG tablet Take 1 tablet (0.5 mg total) by mouth at bedtime as needed for anxiety. 30 tablet 3  . butalbital-acetaminophen-caffeine (FIORICET, ESGIC) 50-325-40 MG tablet Take 1-2 tablets by mouth every 4 (four) hours as needed for  migraine.     . folic acid (FOLVITE) 1 MG tablet Take 4 mg by mouth daily.    . LamoTRIgine (LAMICTAL XR) 200 MG TB24 24 hour tablet Take 2 tablets (400 mg total) by mouth 2 (two) times daily. (Patient taking differently: Take 400 mg by mouth 2 (two) times daily. Takes 1000 and 2200) 360 tablet 4  . LamoTRIgine 100 MG TB24 24 hour tablet Take 1 tablet (100 mg total) by mouth at bedtime. (Patient taking differently: Take 100 mg by mouth at bedtime. ) 30 tablet 11  . levETIRAcetam (KEPPRA) 750 MG tablet Take 2 tablets (1,500 mg total) by mouth 2 (two) times daily. (Patient taking differently: Take 1,500 mg by mouth 2 (two) times daily. ) 360 tablet 4  . loratadine (CLARITIN) 10 MG tablet Take 10 mg by mouth every morning.     Marland Kitchen SYNTHROID 150 MCG tablet Take 1 tablet (150 mcg total) by mouth daily before breakfast. 90 tablet 3   No current facility-administered medications for this visit.     Social History   Socioeconomic History  . Marital status: Single    Spouse name: Not on file  . Number of children: Not on file  . Years of education: Not on file  . Highest education level: Not on file  Occupational History  . Not on file  Social Needs  . Financial resource strain: Not on file  . Food insecurity:    Worry: Not on file    Inability: Not on file  . Transportation needs:    Medical: Not on file    Non-medical: Not on file  Tobacco Use  . Smoking status: Never Smoker  . Smokeless tobacco: Never Used  Substance and Sexual Activity  . Alcohol use: Not Currently    Comment: SELDOM, NONE IN  10 MONTHS  . Drug use: No  . Sexual activity: Yes    Birth control/protection: None  Lifestyle  . Physical activity:    Days per week: Not on file    Minutes per session: Not on file  . Stress: Not on file  Relationships  . Social connections:    Talks on phone: Not on file    Gets together: Not on file    Attends religious service: Not on file    Active member of club or organization:  Not on file    Attends meetings of clubs or organizations: Not on file    Relationship status: Not on file  . Intimate partner violence:    Fear of current or ex partner: Not on file    Emotionally abused: Not on file    Physically abused: Not on file  Forced sexual activity: Not on file  Other Topics Concern  . Not on file  Social History Narrative  . Not on file    Family History  Problem Relation Age of Onset  . Leukemia Other   . Diabetes Other   . Diabetes Mother   . Skin cancer Mother   . Hyperlipidemia Mother   . Hypertension Father   . Asthma Father   . Skin cancer Maternal Aunt   . Multiple myeloma Maternal Aunt   . Testicular cancer Maternal Uncle   . Hyperlipidemia Maternal Grandmother   . AAA (abdominal aortic aneurysm) Maternal Grandmother   . COPD Maternal Grandmother   . Prostate cancer Maternal Grandfather   . AAA (abdominal aortic aneurysm) Maternal Grandfather     Thereasa Solo, MD 05/03/2018, 3:43 PM

## 2018-05-03 NOTE — Patient Instructions (Signed)
Preparing for your Surgery  Plan for surgery on June 01, 2018 with Dr. [name] at [place] will be scheduled for a robotic assisted total hysterectomy, bilateral salpingectomy, upper vaginectomy.  Pre-operative Testing -You will receive a phone call from presurgical testing at [facility] to arrange for a pre-operative testing appointment before your surgery.  This appointment normally occurs one to two weeks before your scheduled surgery.   -Bring your insurance card, copy of an advanced directive if applicable, medication list  -At that visit, you will be asked to sign a consent for a possible blood transfusion in case a transfusion becomes necessary during surgery.  The need for a blood transfusion is rare but having consent is a necessary part of your care.     -You should not be taking blood thinners or aspirin at least ten days prior to surgery unless instructed by your surgeon.  Day Before Surgery at [place] will be asked to take in a light diet the day before surgery.  Avoid carbonated beverages.  You will be advised to have nothing to eat or drink after midnight the evening before.    Eat a light diet the day before surgery.  Examples including soups, broths, toast, yogurt, mashed potatoes.  Things to avoid include carbonated beverages (fizzy beverages), raw fruits and raw vegetables, or beans.   If your bowels are filled with gas, your surgeon will have difficulty visualizing your pelvic organs which increases your surgical risks.  Your role in recovery Your role is to become active as soon as directed by your doctor, while still giving yourself time to heal.  Rest when you feel tired. You will be asked to do the following in order to speed your recovery:  - Cough and breathe deeply. This helps toclear and expand your lungs and can prevent pneumonia. You may be given a spirometer to practice deep breathing. A staff member will show you  how to use the spirometer. - Do mild physical activity. Walking or moving your legs help your circulation and body functions return to normal. A staff member will help you when you try to walk and will provide you with simple exercises. Do not try to get up or walk alone the first time. - Actively manage your pain. Managing your pain lets you move in comfort. We will ask you to rate your pain on a scale of zero to 10. It is your responsibility to tell your doctor or nurse where and how much you hurt so your pain can be treated.  Special Considerations -If you are diabetic, you may be placed on insulin after surgery to have closer control over your blood sugars to promote healing and recovery.  This does not mean that you will be discharged on insulin.  If applicable, your oral antidiabetics will be resumed when you are tolerating a solid diet.  -Your final pathology results from surgery should be available by the Friday after surgery and the results will be relayed to you when available.  -Dr. Lahoma Crocker is the Surgeon that assists your GYN Oncologist with surgery.  The next day after your surgery you will either see your GYN Oncologist or Dr. Lahoma Crocker.   Blood Transfusion Information WHAT IS A BLOOD TRANSFUSION? A transfusion is the replacement of blood or some of its parts. Blood is made up of multiple cells which provide different functions.  Red blood cells carry oxygen and are used for blood loss replacement.  White blood cells fight against infection.  Platelets control bleeding.  Plasma helps clot blood.  Other blood products are available for specialized needs, such as hemophilia or other clotting disorders. BEFORE THE TRANSFUSION  Who gives blood for transfusions?   You may be able to donate blood to be used at a later date on yourself (autologous donation).  Relatives can be asked to donate blood. This is generally not any safer than if you have received  blood from a stranger. The same precautions are taken to ensure safety when a relative's blood is donated.  Healthy volunteers who are fully evaluated to make sure their blood is safe. This is blood bank blood. Transfusion therapy is the safest it has ever been in the practice of medicine. Before blood is taken from a donor, a complete history is taken to make sure that person has no history of diseases nor engages in risky social behavior (examples are intravenous drug use or sexual activity with multiple partners). The donor's travel history is screened to minimize risk of transmitting infections, such as malaria. The donated blood is tested for signs of infectious diseases, such as HIV and hepatitis. The blood is then tested to be sure it is compatible with you in order to minimize the chance of a transfusion reaction. If you or a relative donates blood, this is often done in anticipation of surgery and is not appropriate for emergency situations. It takes many days to process the donated blood. RISKS AND COMPLICATIONS Although transfusion therapy is very safe and saves many lives, the main dangers of transfusion include:   Getting an infectious disease.  Developing a transfusion reaction. This is an allergic reaction to something in the blood you were given. Every precaution is taken to prevent this. The decision to have a blood transfusion has been considered carefully by your caregiver before blood is given. Blood is not given unless the benefits outweigh the risks.

## 2018-05-03 NOTE — H&P (View-Only) (Signed)
Follow-up Note: Gyn-Onc   Kelly Mcclure 37 y.o. female  Chief Complaint  Patient presents with  . Malignant neoplasm of overlapping sites of cervix [facility])   Assessment :  Hx of Stage IA 1 cervical cancer 2011. Recent Pap smear showing HGSIL, ectocervical biopsies with CIN 3.   S/p CKC of cervix on 02/18/18. 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  Plan:  Recommendation is for excision.  Due to the concerning appearance of the lesion despite no evidence of invasion on multiple biopsies, excision rather than ablation is recommended.  This plan is agreed upon by my partner, Dr. Fermin Schwab.  Due to the close proximity of the lesion to the posterior lip of the cervix, in addition to her history of prior cervical carcinoma, we are both recommending hysterectomy with upper posterior vaginectomy (and salpingectomy) in order to optimize surgical margins and minimize the likelihood of recurrence.  I discussed that this will result in permanent infertility.  The patient is understanding of this, and is accepting of this.  I did offer her fertility sparing treatment with upper vaginectomy without hysterectomy, however I explained that this could not guarantee the same oncologic outcomes.  The patient does not wish to compromise potential oncologic outcomes in order to preserve fertility at this point in time as she does not plan for future fertility.  I discussed the plan of surgery including removal of the upper posterior vagina cervix, uterus, and fallopian tubes.  I reviewed with diagrams and drawings with her mother and the patient surgical steps.  I discussed the potential risks with this procedure most particularly damage to adjacent structures such as the rectum and bladder.  She has a history of a prior abdominal cerclage and cesarean section both of which place her at increased risk for  bladder injury, and the need to take an adequate posterior vaginal wall margin places her at increased risk for rectal wall injury.  I discussed that postoperatively she may have some dysfunction with evacuation of stool including discomfort due to the proximity of the posterior vaginal cuff incision to the rectal wall.  I discussed that if invasive carcinoma is identified on final pathology and margins are in adequate from resection, she may require additional therapy either with surgical resection of the rectovaginal septum, and or radiation to the pelvis.  I discussed that the vagina will be shortened after the surgical procedure which may alter the the sensation and functionality of intercourse.  I anticipate that she should still be able to achieve orgasm.  I did explain that there will be a requirement for avoidance of vaginal intercourse for at least 3 months postoperatively due to the concern for vaginal cuff dehiscence.  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  I feel that this is reasonable given that on multiple samplings of her vaginal lesion and invasive process is not been identified.  However I do feel that she would benefit from an examination by me prior to surgery to ensure that no change in the lesion had occurred.  HPI: Patient had an abnormal Pap smear in 2011. 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 Surgical margins were positive. Therefore, the patient underwent a cold knife conization on 04/01/2010.  Pathology from that specimen showed severe dysplasia with negative margins. The patient was subsequently followed with Pap smears. A pap in March 2012 was ascus. ECC was negative. Repeat ECC in June 2012 was negative. Pap smear in 03/03/2012 showed high-grade SIL and again ECC was negative.  Conservative followup was recommended.  Repeat colposcopy in December 2013 identified a lesion in the posterior vaginal fornix that was biopsied. Final pathology only showed atypia. A pap smear on that same day only showed LSIL.  A Pap smear on 10/01/2012 showed high-grade SIL.  Pap smear in March was low grade SIL. Patient subsequently underwent cervical dilatation, endocervical curettage, and vaginal biopsy (03/01/2013). The only abnormality found was atypia on vaginal biopsy.  October 2014 Pap smear with low-grade SIL.  March 21, 2016 Pap smear showed HGSIL.  Endocervical curettage showed benign endocervical glands.  Pap smear July 15, 2016 showed HGSIL.  Endocervical curettage showed benign endocervical glands.  January 13, 2018 Pap smear showed HGSIL.  Exam by Dr Fermin Schwab on July 9th showed a friable exophytic lesion on the cervix which was biopsied. Final pathology showed "at least CIN III, invasion could not be ruled out". Colpo had been difficult as the cervix was flush with the vagina and the upper vagina was narrowed.  The patient was interested in a definitive hysterectomy procedure however she required an excisional procedure to rule out invasive carcinoma before having this.  On 02/18/18 she underwent cold knife conization of the cervix in the operating room.  Inspection of the cervix with acetic acid application was grossly unremarkable with no apparent lesions seen.  The upper vagina was also grossly normal.  The cervix was small, fairly flush with the surrounding vagina, however the conization procedure was relatively straightforward.  A 0.9 cm AP dimension by 1.5 cm deep dimension cone was taken without difficulty.  There is minimal bleeding in the operating room.  The patient began experiencing high fevers to 101 and 102 on postoperative day 4 and was prescribed empiric antibiotics. Exam revealed no concerning signs.  Final pathology revealed no residual  dysplasia.  On postoperative examination a friable lesion was seen on the posterior upper vaginal wall measuring approximately 2cm. It was separate to, but abutting, the posterior lip of the cervix. 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   Interval History:  The patient was recommended surgical excision of the lesion, and due to its proximity with the cervix, was recommended hysterectomy (without oophorectomy) at the time of surgery in order to establish adequate margins.  The patient had her surgery scheduled (robotic hysterectomy, upper vaginectomy), however canceled this after she discussed the proposed surgery with her referring doctor, Dr Ronita Hipps, who had concerns about the surgical plan. Dr Ronita Hipps requested that Fianna see my partner, Dr Fermin Schwab for a second opinion.   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  Given his shared concern that this lesion may harbor occult invasive disease, and due to its proximity to the cervix, he agreed with the surgical plan of hysterectomy (not oophorectomy) with salpingectomy and upper vaginectomy.   Review of Systems:10 point review of systems is negative except as noted in interval history.   Vitals: Blood pressure 126/86, pulse 85, temperature 98.3 F (36.8 C), temperature source Oral, resp. rate 18, height '5\' 5"'  (1.651 m), weight 162 lb (73.5 kg), SpO2  98 %, not currently breastfeeding.  Physical Exam: General : The patient is a healthy woman in no acute distress.  HEENT: normocephalic, extraoccular movements normal; neck is supple without thyromegally  Lynphnodes: Supraclavicular and inguinal nodes not enlarged  Abdomen: Soft, non-tender, no ascites, no organomegally, no masses, no hernias  Pelvic:  EGBUS: Normal female  Vagina:  Normal, no lesions  Urethra and Bladder: Normal, non-tender  Cervix: Flush with the vaginal fornices cone bed healing normally.  There is a 1-2cm exophytic area that is friable, it is nodular, it is arising from the posterior upper vagina (separate from the posterior lip of the cervix). Uterus: Anterior normal shape size and consistency Bi-manual examination: Non-tender; no adenxal masses or nodularity  Lower extremities: No edema or varicosities. Normal range of motion   Allergies  Allergen Reactions  . Sulfa Antibiotics Swelling    Tongue swelling, throat irritated  . Adhesive [Tape] Other (See Comments)    "skin burn"  . Other Swelling and Other (See Comments)    Magic mouth wash/ causes swelling of the tongue  . Relpax [Eletriptan Hydrobromide] Other (See Comments)    Stroke-like symptoms  . Ultram [Tramadol] Other (See Comments)    2004--  Per pt had seizure due to interaction with other medication she was taking at the time    Past Medical History:  Diagnosis Date  . Cancer [facility]) DX 2011   CERVICAL   . Cough    WITH SEASONAL ALLEGIES, NONPRODUCTIVE LAST 2 DAYS  . H/O radioactive iodine thyroid ablation 12/2009  . 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 History of cervical dysplasia   . History of hyperthyroidism    per pt dx 2000 -- s/p RAI  2011  . History of kidney stones 2011  . Hx of varicella   . Hypothyroidism, postradioiodine therapy    endocrinology-  dr Cruzita Lederer  . Migraines   . Mild persistent asthma 01/19/2018   followed by dr Verlin Fester ([facility] and asthma [facility]) MILD (SUBCLINICAL PER PT)  . 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 Vaginal Pap smear, abnormal     Past Surgical History:  Procedure Laterality Date  . ABDOMINAL CERCLAGE N/A 11/19/2016   Procedure: LAPAROSCOPIC TRANSABDOMINAL  CERVICAL-ISTHMUSx2;  Surgeon: Governor Specking, MD;  Location: [place] [facility];  Service: Gynecology;  Laterality: N/A;  with ultrasound guidance. Start laparoscopically HOLD OPEN ABDOMINAL ITEMS   . CERCLAGE LAPAROSCOPIC ABDOMINAL N/A 11/19/2016   Procedure: CERCLAGE LAPAROSCOPIC ABDOMINAL;  Surgeon: Governor Specking, MD;  Location: [place] [facility];  Service: Gynecology;  Laterality: N/A;  . CERVICAL CONIZATION W/BX N/A 02/18/2018   Procedure: COLD KNIFE CONIZATION OF CERVIX WITH POSSIBLE BIOPSY;  Surgeon: [name], MD;  Location: [place];  Service: Gynecology;  Laterality: N/A;  . CESAREAN SECTION N/A 05/27/2017   Procedure: Primary CESAREAN SECTION;  Surgeon: Brien Few, MD;  Location: [place];  Service: Obstetrics;  Laterality: N/A;  EDD: 11/4/18Allergy: Adhesive, Relpax, Sulfa, Ultram  . COLD KNIFE CONIZATION  03-29-2010   dr Ronita Hipps  [facility]  . DILATION AND CURETTAGE OF UTERUS  2009  . DILATION AND CURETTAGE OF UTERUS N/A 03/01/2013   Procedure: CERVICAL DILATATION AND ENDOCERVICAL CURRETAGE AND VAAGINAL BIOPIES;  Surgeon: Alvino Chapel, MD;  Location: [facility];  Service: Gynecology;  Laterality: N/A;  .  LEEP  02/25/2010;  12/ 2012  dr Ronita Hipps office  . NASAL SEPTUM SURGERY  2006   Repair of deviated septum  . SIGMOIDOSCOPY  2009  . TONSILLECTOMY AND ADENOIDECTOMY  1995    Current Outpatient Medications  Medication Sig Dispense Refill  . albuterol (PROVENTIL HFA;VENTOLIN HFA) 108 (90 Base) MCG/ACT inhaler Inhale 2 puffs into the lungs every 6 (six) hours as needed for wheezing or shortness of breath. (Patient taking differently: Inhale 2 puffs into the lungs as needed for wheezing or shortness of breath. ) 1 Inhaler 1  . ALPRAZolam (XANAX) 0.5 MG tablet Take 1 tablet (0.5 mg total) by mouth at bedtime as needed for anxiety. 30 tablet 3  . butalbital-acetaminophen-caffeine (FIORICET, ESGIC) 50-325-40 MG tablet Take 1-2 tablets by mouth every 4 (four) hours as needed for  migraine.     . folic acid (FOLVITE) 1 MG tablet Take 4 mg by mouth daily.    . LamoTRIgine (LAMICTAL XR) 200 MG TB24 24 hour tablet Take 2 tablets (400 mg total) by mouth 2 (two) times daily. (Patient taking differently: Take 400 mg by mouth 2 (two) times daily. Takes 1000 and 2200) 360 tablet 4  . LamoTRIgine 100 MG TB24 24 hour tablet Take 1 tablet (100 mg total) by mouth at bedtime. (Patient taking differently: Take 100 mg by mouth at bedtime. ) 30 tablet 11  . levETIRAcetam (KEPPRA) 750 MG tablet Take 2 tablets (1,500 mg total) by mouth 2 (two) times daily. (Patient taking differently: Take 1,500 mg by mouth 2 (two) times daily. ) 360 tablet 4  . loratadine (CLARITIN) 10 MG tablet Take 10 mg by mouth every morning.     Marland Kitchen SYNTHROID 150 MCG tablet Take 1 tablet (150 mcg total) by mouth daily before breakfast. 90 tablet 3   No current facility-administered medications for this visit.     Social History   Socioeconomic History  . Marital status: Single    Spouse name: Not on file  . Number of children: Not on file  . Years of education: Not on file  . Highest education level: Not on file  Occupational History  . Not on file  Social Needs  . Financial resource strain: Not on file  . Food insecurity:    Worry: Not on file    Inability: Not on file  . Transportation needs:    Medical: Not on file    Non-medical: Not on file  Tobacco Use  . Smoking status: Never Smoker  . Smokeless tobacco: Never Used  Substance and Sexual Activity  . Alcohol use: Not Currently    Comment: SELDOM, NONE IN  10 MONTHS  . Drug use: No  . Sexual activity: Yes    Birth control/protection: None  Lifestyle  . Physical activity:    Days per week: Not on file    Minutes per session: Not on file  . Stress: Not on file  Relationships  . Social connections:    Talks on phone: Not on file    Gets together: Not on file    Attends religious service: Not on file    Active member of club or organization:  Not on file    Attends meetings of clubs or organizations: Not on file    Relationship status: Not on file  . Intimate partner violence:    Fear of current or ex partner: Not on file    Emotionally abused: Not on file    Physically abused: Not on file  Forced sexual activity: Not on file  Other Topics Concern  . Not on file  Social History Narrative  . Not on file    Family History  Problem Relation Age of Onset  . Leukemia Other   . Diabetes Other   . Diabetes Mother   . Skin cancer Mother   . Hyperlipidemia Mother   . Hypertension Father   . Asthma Father   . Skin cancer Maternal Aunt   . Multiple myeloma Maternal Aunt   . Testicular cancer Maternal Uncle   . Hyperlipidemia Maternal Grandmother   . AAA (abdominal aortic aneurysm) Maternal Grandmother   . COPD Maternal Grandmother   . Prostate cancer Maternal Grandfather   . AAA (abdominal aortic aneurysm) Maternal Grandfather     Thereasa Solo, MD 05/03/2018, 3:43 PM

## 2018-05-12 ENCOUNTER — Encounter: Payer: Self-pay | Admitting: Gynecologic Oncology

## 2018-05-21 NOTE — Patient Instructions (Addendum)
Kelly Mcclure  05/21/2018   Your procedure is scheduled on: 06-01-18     Report to [facility] Main  Entrance    Report to Admitting at 5:30 AM     Call this number if you have problems the morning of surgery [phone]    Eat a light diet the day before surgery.  Examples including soups, broths, toast, yogurt, mashed potatoes.  Things to avoid include carbonated beverages (fizzy beverages), raw fruits and raw vegetables, or beans.    If your bowels are filled with gas, your surgeon will have difficulty visualizing your pelvic organs which increases your surgical risks.    Remember: Do not eat food or drink liquids :After Midnight.     BRUSH YOUR TEETH MORNING OF SURGERY AND RINSE YOUR MOUTH OUT, NO CHEWING GUM CANDY OR MINTS.     Take these medicines the morning of surgery with A SIP OF WATER: Fluoxetine (Prozac), Levetiracetam (Keppra), Loratadine, and Synthroid                                 You may not have any metal on your body including hair pins and              piercings  Do not wear jewelry, make-up, lotions, powders or perfumes, deodorant             Do not wear nail polish.  Do not shave  48 hours prior to surgery.                Do not bring valuables to the hospital. [place].  Contacts, dentures or bridgework may not be worn into surgery.  Leave suitcase in the car. After surgery it may be brought to your room.    Special Instructions: Please follow your prep instructions as provided by your surgeon's office              Please read over the following fact sheets you were given: _____________________________________________________________________          [facility] - Preparing for Surgery Before surgery, you can play an important role.  Because skin is not sterile, your skin needs to be as free of germs as possible.  You can reduce the number of germs on your skin by washing  with CHG (chlorahexidine gluconate) soap before surgery.  CHG is an antiseptic cleaner which kills germs and bonds with the skin to continue killing germs even after washing. Please DO NOT use if you have an allergy to CHG or antibacterial soaps.  If your skin becomes reddened/irritated stop using the CHG and inform your nurse when you arrive at Short Stay. Do not shave (including legs and underarms) for at least 48 hours prior to the first CHG shower.  You may shave your face/neck. Please follow these instructions carefully:  1.  Shower with CHG Soap the night before surgery and the  morning of Surgery.  2.  If you choose to wash your hair, wash your hair first as usual with your  normal  shampoo.  3.  After you shampoo, rinse your hair and body thoroughly to remove the  shampoo.  4.  Use CHG as you would any other liquid soap.  You can apply chg directly  to the skin and wash                       Gently with a scrungie or clean washcloth.  5.  Apply the CHG Soap to your body ONLY FROM THE NECK DOWN.   Do not use on face/ open                           Wound or open sores. Avoid contact with eyes, ears mouth and genitals (private parts).                       Wash face,  Genitals (private parts) with your normal soap.             6.  Wash thoroughly, paying special attention to the area where your surgery  will be performed.  7.  Thoroughly rinse your body with warm water from the neck down.  8.  DO NOT shower/wash with your normal soap after using and rinsing off  the CHG Soap.                9.  Pat yourself dry with a clean towel.            10.  Wear clean pajamas.            11.  Place clean sheets on your bed the night of your first shower and do not  sleep with pets. Day of Surgery : Do not apply any lotions/deodorants the morning of surgery.  Please wear clean clothes to the hospital/surgery center.  FAILURE TO FOLLOW THESE INSTRUCTIONS MAY RESULT IN THE  CANCELLATION OF YOUR SURGERY PATIENT SIGNATURE_________________________________  NURSE SIGNATURE__________________________________  ________________________________________________________________________   [name]  An incentive spirometer is a tool that can help keep your lungs clear and active. This tool measures how well you are filling your lungs with each breath. Taking long deep breaths may help reverse or decrease the chance of developing breathing (pulmonary) problems (especially infection) following:  A long period of time when you are unable to move or be active. BEFORE THE PROCEDURE   If the spirometer includes an indicator to show your best effort, your nurse or respiratory therapist will set it to a desired goal.  If possible, sit up straight or lean slightly forward. Try not to slouch.  Hold the incentive spirometer in an upright position. INSTRUCTIONS FOR USE  1. Sit on the edge of your bed if possible, or sit up as far as you can in bed or on a chair. 2. Hold the incentive spirometer in an upright position. 3. Breathe out normally. 4. Place the mouthpiece in your mouth and seal your lips tightly around it. 5. Breathe in slowly and as deeply as possible, raising the piston or the ball toward the top of the column. 6. Hold your breath for 3-5 seconds or for as long as possible. Allow the piston or ball to fall to the bottom of the column. 7. Remove the mouthpiece from your mouth and breathe out normally. 8. Rest for a few seconds and repeat Steps 1 through 7 at least 10 times every 1-2 hours when you are awake. Take your time and take a few normal breaths between deep breaths. 9. The spirometer may include an indicator to show  your best effort. Use the indicator as a goal to work toward during each repetition. 10. After each set of 10 deep breaths, practice coughing to be sure your lungs are clear. If you have an incision (the cut made at the time of surgery),  support your incision when coughing by placing a pillow or rolled up towels firmly against it. Once you are able to get out of bed, walk around indoors and cough well. You may stop using the incentive spirometer when instructed by your caregiver.  RISKS AND COMPLICATIONS  Take your time so you do not get dizzy or light-headed.  If you are in pain, you may need to take or ask for pain medication before doing incentive spirometry. It is harder to take a deep breath if you are having pain. AFTER USE  Rest and breathe slowly and easily.  It can be helpful to keep track of a log of your progress. Your caregiver can provide you with a simple table to help with this. If you are using the spirometer at home, follow these instructions: [place] IF:   You are having difficultly using the spirometer.  You have trouble using the spirometer as often as instructed.  Your pain medication is not giving enough relief while using the spirometer.  You develop fever of 100.5 F (38.1 C) or higher. SEEK IMMEDIATE MEDICAL CARE IF:   You cough up bloody sputum that had not been present before.  You develop fever of 102 F (38.9 C) or greater.  You develop worsening pain at or near the incision site. MAKE SURE YOU:   Understand these instructions.  Will watch your condition.  Will get help right away if you are not doing well or get worse. Document Released: 12/01/2006 Document Revised: 10/13/2011 Document Reviewed: 02/01/2007 Exit[employer] Patient Information 2014 [employer], [place].   ________________________________________________________________________  WHAT IS A BLOOD TRANSFUSION? Blood Transfusion Information  A transfusion is the replacement of blood or some of its parts. Blood is made up of multiple cells which provide different functions.  Red blood cells carry oxygen and are used for blood loss replacement.  White blood cells fight against infection.  Platelets control  bleeding.  Plasma helps clot blood.  Other blood products are available for specialized needs, such as hemophilia or other clotting disorders. BEFORE THE TRANSFUSION  Who gives blood for transfusions?   Healthy volunteers who are fully evaluated to make sure their blood is safe. This is blood bank blood. Transfusion therapy is the safest it has ever been in the practice of medicine. Before blood is taken from a donor, a complete history is taken to make sure that person has no history of diseases nor engages in risky social behavior (examples are intravenous drug use or sexual activity with multiple partners). The donor's travel history is screened to minimize risk of transmitting infections, such as malaria. The donated blood is tested for signs of infectious diseases, such as HIV and hepatitis. The blood is then tested to be sure it is compatible with you in order to minimize the chance of a transfusion reaction. If you or a relative donates blood, this is often done in anticipation of surgery and is not appropriate for emergency situations. It takes many days to process the donated blood. RISKS AND COMPLICATIONS Although transfusion therapy is very safe and saves many lives, the main dangers of transfusion include:   Getting an infectious disease.  Developing a transfusion reaction. This is an allergic reaction to  something in the blood you were given. Every precaution is taken to prevent this. The decision to have a blood transfusion has been considered carefully by your caregiver before blood is given. Blood is not given unless the benefits outweigh the risks. AFTER THE TRANSFUSION  Right after receiving a blood transfusion, you will usually feel much better and more energetic. This is especially true if your red blood cells have gotten low (anemic). The transfusion raises the level of the red blood cells which carry oxygen, and this usually causes an energy increase.  The nurse  administering the transfusion will monitor you carefully for complications. HOME CARE INSTRUCTIONS  No special instructions are needed after a transfusion. You may find your energy is better. Speak with your caregiver about any limitations on activity for underlying diseases you may have. SEEK MEDICAL CARE IF:   Your condition is not improving after your transfusion.  You develop redness or irritation at the intravenous (IV) site. SEEK IMMEDIATE MEDICAL CARE IF:  Any of the following symptoms occur over the next 12 hours:  Shaking chills.  You have a temperature by mouth above 102 F (38.9 C), not controlled by medicine.  Chest, back, or muscle pain.  People around you feel you are not acting correctly or are confused.  Shortness of breath or difficulty breathing.  Dizziness and fainting.  You get a rash or develop hives.  You have a decrease in urine output.  Your urine turns a dark color or changes to pink, red, or brown. Any of the following symptoms occur over the next 10 days:  You have a temperature by mouth above 102 F (38.9 C), not controlled by medicine.  Shortness of breath.  Weakness after normal activity.  The white part of the eye turns yellow (jaundice).  You have a decrease in the amount of urine or are urinating less often.  Your urine turns a dark color or changes to pink, red, or brown. Document Released: 07/18/2000 Document Revised: 10/13/2011 Document Reviewed: 03/06/2008 [facility] Patient Information 2014 [place], [place].  _______________________________________________________________________

## 2018-05-24 ENCOUNTER — Other Ambulatory Visit: Payer: Self-pay

## 2018-05-24 ENCOUNTER — Encounter (HOSPITAL_COMMUNITY): Payer: Self-pay

## 2018-05-24 ENCOUNTER — Encounter (HOSPITAL_COMMUNITY)
Admission: RE | Admit: 2018-05-24 | Discharge: 2018-05-24 | Disposition: A | Discharge status: Home / Self Care | Payer: 59 | Source: Ambulatory Visit | Attending: Gynecologic Oncology | Admitting: Gynecologic Oncology

## 2018-05-24 DIAGNOSIS — Z01818 Encounter for other preprocedural examination: Secondary | ICD-10-CM | POA: Diagnosis not present

## 2018-05-24 DIAGNOSIS — N893 Dysplasia of vagina, unspecified: Secondary | ICD-10-CM | POA: Diagnosis not present

## 2018-05-24 LAB — BASIC METABOLIC PANEL
Anion gap: 8 (ref 5–15)
BUN: 11 mg/dL (ref 6–20)
CHLORIDE: 104 mmol/L (ref 98–111)
CO2: 27 mmol/L (ref 22–32)
Calcium: 9.5 mg/dL (ref 8.9–10.3)
Creatinine, Ser: 0.69 mg/dL (ref 0.44–1.00)
GFR calc Af Amer: >60 mL/min (ref >60)
GLUCOSE: 89 mg/dL (ref 70–99)
POTASSIUM: 4.3 mmol/L (ref 3.5–5.1)
Sodium: 139 mmol/L (ref 135–145)

## 2018-05-24 LAB — CBC
HEMATOCRIT: 46.6 % — AB (ref 36.0–46.0)
HEMOGLOBIN: 14.7 g/dL (ref 12.0–15.0)
MCH: 26.1 pg (ref 26.0–34.0)
MCHC: 31.5 g/dL (ref 30.0–36.0)
MCV: 82.6 fL (ref 80.0–100.0)
Platelets: 278 10*3/uL (ref 150–400)
RBC: 5.64 MIL/uL — ABNORMAL HIGH (ref 3.87–5.11)
RDW: 13.5 % (ref 11.5–15.5)
WBC: 8.3 10*3/uL (ref 4.0–10.5)
nRBC: 0 % (ref 0.0–0.2)

## 2018-05-24 LAB — URINALYSIS, ROUTINE W REFLEX MICROSCOPIC
Bilirubin Urine: NEGATIVE
GLUCOSE, UA: NEGATIVE mg/dL
KETONES UR: 5 mg/dL — AB
NITRITE: NEGATIVE
PH: 6 (ref 5.0–8.0)
Protein, ur: 30 mg/dL — AB
Specific Gravity, Urine: 1.024 (ref 1.005–1.030)

## 2018-05-24 LAB — PREGNANCY, URINE: Preg Test, Ur: NEGATIVE

## 2018-05-24 NOTE — Progress Notes (Signed)
05-24-18 UA result routed to Dr. Denman George for review.

## 2018-05-26 ENCOUNTER — Encounter: Payer: Self-pay | Admitting: Gynecologic Oncology

## 2018-05-31 ENCOUNTER — Encounter: Payer: Self-pay | Admitting: Gynecologic Oncology

## 2018-06-01 ENCOUNTER — Ambulatory Visit (HOSPITAL_COMMUNITY)
Admission: RE | Admit: 2018-06-01 | Discharge: 2018-06-02 | Disposition: A | Discharge status: Home / Self Care | Payer: 59 | Source: Ambulatory Visit | Attending: Gynecologic Oncology | Admitting: Gynecologic Oncology

## 2018-06-01 ENCOUNTER — Encounter (HOSPITAL_COMMUNITY)
Admission: RE | Disposition: A | Discharge status: Home / Self Care | Payer: Self-pay | Source: Ambulatory Visit | Attending: Gynecologic Oncology

## 2018-06-01 ENCOUNTER — Other Ambulatory Visit: Payer: Self-pay

## 2018-06-01 ENCOUNTER — Ambulatory Visit (HOSPITAL_COMMUNITY): Payer: 59 | Admitting: Certified Registered"

## 2018-06-01 ENCOUNTER — Encounter (HOSPITAL_COMMUNITY): Payer: Self-pay

## 2018-06-01 DIAGNOSIS — E890 Postprocedural hypothyroidism: Secondary | ICD-10-CM | POA: Insufficient documentation

## 2018-06-01 DIAGNOSIS — G40909 Epilepsy, unspecified, not intractable, without status epilepticus: Secondary | ICD-10-CM | POA: Diagnosis not present

## 2018-06-01 DIAGNOSIS — Z91048 Other nonmedicinal substance allergy status: Secondary | ICD-10-CM | POA: Insufficient documentation

## 2018-06-01 DIAGNOSIS — Z882 Allergy status to sulfonamides status: Secondary | ICD-10-CM | POA: Diagnosis not present

## 2018-06-01 DIAGNOSIS — D072 Carcinoma in situ of vagina: Secondary | ICD-10-CM | POA: Diagnosis not present

## 2018-06-01 DIAGNOSIS — Z87442 Personal history of urinary calculi: Secondary | ICD-10-CM | POA: Diagnosis not present

## 2018-06-01 DIAGNOSIS — J4530 Mild persistent asthma, uncomplicated: Secondary | ICD-10-CM | POA: Diagnosis not present

## 2018-06-01 DIAGNOSIS — N893 Dysplasia of vagina, unspecified: Secondary | ICD-10-CM

## 2018-06-01 DIAGNOSIS — Z79899 Other long term (current) drug therapy: Secondary | ICD-10-CM | POA: Insufficient documentation

## 2018-06-01 DIAGNOSIS — Z888 Allergy status to other drugs, medicaments and biological substances status: Secondary | ICD-10-CM | POA: Insufficient documentation

## 2018-06-01 DIAGNOSIS — C52 Malignant neoplasm of vagina: Secondary | ICD-10-CM | POA: Diagnosis present

## 2018-06-01 DIAGNOSIS — Z7951 Long term (current) use of inhaled steroids: Secondary | ICD-10-CM | POA: Diagnosis not present

## 2018-06-01 DIAGNOSIS — Z8541 Personal history of malignant neoplasm of cervix uteri: Secondary | ICD-10-CM | POA: Insufficient documentation

## 2018-06-01 LAB — TYPE AND SCREEN
ABO/RH(D): A POS
Antibody Screen: NEGATIVE

## 2018-06-01 SURGERY — XI ROBOTIC ASSISTED TOTAL HYSTERECTOMY WITH SALPINGECTOMY
Anesthesia: General

## 2018-06-01 MED ORDER — LAMOTRIGINE 100 MG PO TABS
400.0000 mg | ORAL_TABLET | Freq: Once | ORAL | Status: AC
  Administered 2018-06-01: 400 mg via ORAL

## 2018-06-01 MED ORDER — DEXAMETHASONE SODIUM PHOSPHATE 10 MG/ML IJ SOLN
INTRAMUSCULAR | Status: AC
  Filled 2018-06-01: qty 1

## 2018-06-01 MED ORDER — LEVETIRACETAM 500 MG PO TABS
1500.0000 mg | ORAL_TABLET | Freq: Once | ORAL | Status: AC
  Administered 2018-06-01: 1500 mg via ORAL
  Filled 2018-06-01: qty 3

## 2018-06-01 MED ORDER — MIDAZOLAM HCL 5 MG/5ML IJ SOLN
INTRAMUSCULAR | Status: DC | PRN
  Administered 2018-06-01: 2 mg via INTRAVENOUS

## 2018-06-01 MED ORDER — LAMOTRIGINE 100 MG PO TABS
400.0000 mg | ORAL_TABLET | Freq: Two times a day (BID) | ORAL | Status: DC
  Administered 2018-06-01: 400 mg via ORAL
  Filled 2018-06-01 (×3): qty 4

## 2018-06-01 MED ORDER — SUGAMMADEX SODIUM 200 MG/2ML IV SOLN
INTRAVENOUS | Status: AC
  Filled 2018-06-01: qty 2

## 2018-06-01 MED ORDER — KETAMINE HCL 10 MG/ML IJ SOLN
INTRAMUSCULAR | Status: DC | PRN
  Administered 2018-06-01: 30 mg via INTRAVENOUS

## 2018-06-01 MED ORDER — ALBUTEROL SULFATE (2.5 MG/3ML) 0.083% IN NEBU: 2.5000 mg | INHALATION_SOLUTION | Freq: Four times a day (QID) | RESPIRATORY_TRACT | Status: DC | PRN

## 2018-06-01 MED ORDER — FENTANYL CITRATE (PF) 250 MCG/5ML IJ SOLN
INTRAMUSCULAR | Status: AC
  Filled 2018-06-01: qty 5

## 2018-06-01 MED ORDER — ACETAMINOPHEN 500 MG PO TABS
1000.0000 mg | ORAL_TABLET | ORAL | Status: AC
  Administered 2018-06-01: 1000 mg via ORAL
  Filled 2018-06-01: qty 2

## 2018-06-01 MED ORDER — CEFAZOLIN SODIUM-DEXTROSE 2-4 GM/100ML-% IV SOLN
2.0000 g | INTRAVENOUS | Status: AC
  Administered 2018-06-01: 2 g via INTRAVENOUS
  Filled 2018-06-01: qty 100

## 2018-06-01 MED ORDER — KCL IN DEXTROSE-NACL 20-5-0.45 MEQ/L-%-% IV SOLN
INTRAVENOUS | Status: DC
  Administered 2018-06-01: 16:00:00 via INTRAVENOUS
  Filled 2018-06-01: qty 1000

## 2018-06-01 MED ORDER — LEVOTHYROXINE SODIUM 75 MCG PO TABS
150.0000 ug | ORAL_TABLET | Freq: Every day | ORAL | Status: DC
  Administered 2018-06-02: 150 ug via ORAL
  Filled 2018-06-01: qty 2

## 2018-06-01 MED ORDER — ROCURONIUM BROMIDE 10 MG/ML (PF) SYRINGE
PREFILLED_SYRINGE | INTRAVENOUS | Status: DC | PRN
  Administered 2018-06-01: 10 mg via INTRAVENOUS
  Administered 2018-06-01: 20 mg via INTRAVENOUS
  Administered 2018-06-01: 70 mg via INTRAVENOUS

## 2018-06-01 MED ORDER — PROPOFOL 10 MG/ML IV BOLUS
INTRAVENOUS | Status: AC
  Filled 2018-06-01: qty 20

## 2018-06-01 MED ORDER — PROPOFOL 10 MG/ML IV BOLUS
INTRAVENOUS | Status: DC | PRN
  Administered 2018-06-01: 180 mg via INTRAVENOUS

## 2018-06-01 MED ORDER — HYDROMORPHONE HCL 1 MG/ML IJ SOLN
INTRAMUSCULAR | Status: AC
  Administered 2018-06-01: 0.5 mg via INTRAVENOUS
  Filled 2018-06-01: qty 1

## 2018-06-01 MED ORDER — SCOPOLAMINE 1 MG/3DAYS TD PT72
1.0000 | MEDICATED_PATCH | TRANSDERMAL | Status: DC
  Administered 2018-06-01: 1.5 mg via TRANSDERMAL
  Filled 2018-06-01: qty 1

## 2018-06-01 MED ORDER — PROMETHAZINE HCL 25 MG/ML IJ SOLN: 6.2500 mg | INTRAMUSCULAR | Status: DC | PRN

## 2018-06-01 MED ORDER — LAMOTRIGINE 200 MG PO TABS: 400.0000 mg | ORAL_TABLET | Freq: Two times a day (BID) | ORAL | Status: DC

## 2018-06-01 MED ORDER — LEVETIRACETAM 500 MG PO TABS
1500.0000 mg | ORAL_TABLET | Freq: Two times a day (BID) | ORAL | Status: DC
  Administered 2018-06-01 – 2018-06-02 (×2): 1500 mg via ORAL
  Filled 2018-06-01 (×3): qty 3

## 2018-06-01 MED ORDER — DICLOFENAC SODIUM 50 MG PO TBEC
50.0000 mg | DELAYED_RELEASE_TABLET | Freq: Four times a day (QID) | ORAL | Status: DC
  Administered 2018-06-01 – 2018-06-02 (×2): 50 mg via ORAL
  Filled 2018-06-01 (×3): qty 1

## 2018-06-01 MED ORDER — ONDANSETRON HCL 4 MG/2ML IJ SOLN
INTRAMUSCULAR | Status: AC
  Filled 2018-06-01: qty 2

## 2018-06-01 MED ORDER — MIDAZOLAM HCL 2 MG/2ML IJ SOLN
INTRAMUSCULAR | Status: AC
  Filled 2018-06-01: qty 2

## 2018-06-01 MED ORDER — OXYCODONE HCL 5 MG PO TABS
5.0000 mg | ORAL_TABLET | ORAL | Status: DC | PRN
  Administered 2018-06-01 (×2): 5 mg via ORAL
  Filled 2018-06-01 (×2): qty 1

## 2018-06-01 MED ORDER — ONDANSETRON HCL 4 MG/2ML IJ SOLN: 4.0000 mg | Freq: Four times a day (QID) | INTRAMUSCULAR | Status: DC | PRN

## 2018-06-01 MED ORDER — ACETAMINOPHEN 500 MG PO TABS
1000.0000 mg | ORAL_TABLET | Freq: Four times a day (QID) | ORAL | Status: DC
  Administered 2018-06-01 – 2018-06-02 (×2): 1000 mg via ORAL
  Filled 2018-06-01 (×2): qty 2

## 2018-06-01 MED ORDER — KETOROLAC TROMETHAMINE 30 MG/ML IJ SOLN: 15.0000 mg | Freq: Once | INTRAMUSCULAR | Status: DC

## 2018-06-01 MED ORDER — LIDOCAINE 2% (20 MG/ML) 5 ML SYRINGE
INTRAMUSCULAR | Status: DC | PRN
  Administered 2018-06-01: 100 mg via INTRAVENOUS

## 2018-06-01 MED ORDER — GABAPENTIN 300 MG PO CAPS
600.0000 mg | ORAL_CAPSULE | Freq: Every day | ORAL | Status: AC
  Administered 2018-06-01: 600 mg via ORAL
  Filled 2018-06-01: qty 2

## 2018-06-01 MED ORDER — STERILE WATER FOR IRRIGATION IR SOLN
Status: DC | PRN
  Administered 2018-06-01: 1000 mL

## 2018-06-01 MED ORDER — HYDROMORPHONE HCL 1 MG/ML IJ SOLN: 0.5000 mg | INTRAMUSCULAR | Status: DC | PRN

## 2018-06-01 MED ORDER — HYDROMORPHONE HCL 1 MG/ML IJ SOLN
0.2500 mg | INTRAMUSCULAR | Status: DC | PRN
  Administered 2018-06-01 (×4): 0.5 mg via INTRAVENOUS

## 2018-06-01 MED ORDER — FENTANYL CITRATE (PF) 100 MCG/2ML IJ SOLN
INTRAMUSCULAR | Status: DC | PRN
  Administered 2018-06-01: 100 ug via INTRAVENOUS
  Administered 2018-06-01 (×3): 50 ug via INTRAVENOUS

## 2018-06-01 MED ORDER — ONDANSETRON HCL 4 MG PO TABS: 4.0000 mg | ORAL_TABLET | Freq: Four times a day (QID) | ORAL | Status: DC | PRN

## 2018-06-01 MED ORDER — SUGAMMADEX SODIUM 200 MG/2ML IV SOLN
INTRAVENOUS | Status: DC | PRN
  Administered 2018-06-01: 150 mg via INTRAVENOUS

## 2018-06-01 MED ORDER — ALPRAZOLAM 0.5 MG PO TABS: 0.5000 mg | ORAL_TABLET | Freq: Every evening | ORAL | Status: DC | PRN

## 2018-06-01 MED ORDER — LIDOCAINE 2% (20 MG/ML) 5 ML SYRINGE
INTRAMUSCULAR | Status: AC
  Filled 2018-06-01: qty 5

## 2018-06-01 MED ORDER — LACTATED RINGERS IR SOLN
Status: DC | PRN
  Administered 2018-06-01: 1

## 2018-06-01 MED ORDER — KETAMINE HCL 10 MG/ML IJ SOLN
INTRAMUSCULAR | Status: AC
  Filled 2018-06-01: qty 1

## 2018-06-01 MED ORDER — GABAPENTIN 300 MG PO CAPS
300.0000 mg | ORAL_CAPSULE | ORAL | Status: AC
  Administered 2018-06-01: 300 mg via ORAL
  Filled 2018-06-01: qty 1

## 2018-06-01 MED ORDER — DEXAMETHASONE SODIUM PHOSPHATE 4 MG/ML IJ SOLN
4.0000 mg | INTRAMUSCULAR | Status: AC
  Administered 2018-06-01: 10 mg via INTRAVENOUS

## 2018-06-01 MED ORDER — FLUOXETINE HCL 20 MG PO CAPS
40.0000 mg | ORAL_CAPSULE | Freq: Every morning | ORAL | Status: DC
  Administered 2018-06-02: 40 mg via ORAL
  Filled 2018-06-01: qty 2

## 2018-06-01 MED ORDER — ROCURONIUM BROMIDE 10 MG/ML (PF) SYRINGE
PREFILLED_SYRINGE | INTRAVENOUS | Status: AC
  Filled 2018-06-01: qty 10

## 2018-06-01 MED ORDER — SENNOSIDES-DOCUSATE SODIUM 8.6-50 MG PO TABS
2.0000 | ORAL_TABLET | Freq: Every day | ORAL | Status: DC
  Administered 2018-06-01: 2 via ORAL
  Filled 2018-06-01: qty 2

## 2018-06-01 MED ORDER — GABAPENTIN 600 MG PO TABS
600.0000 mg | ORAL_TABLET | Freq: Every day | ORAL | Status: DC
  Filled 2018-06-01: qty 1

## 2018-06-01 MED ORDER — LEVETIRACETAM 500 MG PO TABS: 1500.0000 mg | ORAL_TABLET | Freq: Two times a day (BID) | ORAL | Status: DC

## 2018-06-01 MED ORDER — LACTATED RINGERS IV SOLN
INTRAVENOUS | Status: DC
  Administered 2018-06-01: 08:00:00 via INTRAVENOUS
  Administered 2018-06-01: 1000 mL via INTRAVENOUS

## 2018-06-01 MED ORDER — ONDANSETRON HCL 4 MG/2ML IJ SOLN
INTRAMUSCULAR | Status: DC | PRN
  Administered 2018-06-01: 4 mg via INTRAVENOUS

## 2018-06-01 SURGICAL SUPPLY — 55 items
ADH SKN CLS APL DERMABOND .7 (GAUZE/BANDAGES/DRESSINGS) ×2
AGENT HMST KT MTR STRL THRMB (HEMOSTASIS)
APL ESCP 34 STRL LF DISP (HEMOSTASIS)
APPLICATOR SURGIFLO ENDO (HEMOSTASIS) IMPLANT
BAG LAPAROSCOPIC 12 15 PORT 16 (BASKET) IMPLANT
BAG RETRIEVAL 12/15 (BASKET)
BAG SPEC RTRVL LRG 6X4 10 (ENDOMECHANICALS)
COVER BACK TABLE 60X90IN (DRAPES) ×3 IMPLANT
COVER TIP SHEARS 8 DVNC (MISCELLANEOUS) ×2 IMPLANT
COVER TIP SHEARS 8MM DA VINCI (MISCELLANEOUS) ×1
COVER WAND RF STERILE (DRAPES) IMPLANT
DERMABOND ADVANCED (GAUZE/BANDAGES/DRESSINGS) ×1
DERMABOND ADVANCED .7 DNX12 (GAUZE/BANDAGES/DRESSINGS) ×2 IMPLANT
DRAPE ARM DVNC X/XI (DISPOSABLE) ×8 IMPLANT
DRAPE COLUMN DVNC XI (DISPOSABLE) ×2 IMPLANT
DRAPE DA VINCI XI ARM (DISPOSABLE) ×4
DRAPE DA VINCI XI COLUMN (DISPOSABLE) ×1
DRAPE SHEET LG 3/4 BI-LAMINATE (DRAPES) ×3 IMPLANT
DRAPE SURG IRRIG POUCH 19X23 (DRAPES) ×3 IMPLANT
ELECT REM PT RETURN 15FT ADLT (MISCELLANEOUS) ×3 IMPLANT
GLOVE BIO SURGEON STRL SZ 6 (GLOVE) ×12 IMPLANT
GLOVE BIO SURGEON STRL SZ 6.5 (GLOVE) IMPLANT
GOWN STRL REUS W/ TWL LRG LVL3 (GOWN DISPOSABLE) ×4 IMPLANT
GOWN STRL REUS W/TWL LRG LVL3 (GOWN DISPOSABLE) ×6
HOLDER FOLEY CATH W/STRAP (MISCELLANEOUS) ×3 IMPLANT
IRRIG SUCT STRYKERFLOW 2 WTIP (MISCELLANEOUS) ×3
IRRIGATION SUCT STRKRFLW 2 WTP (MISCELLANEOUS) ×2 IMPLANT
KIT PROCEDURE DA VINCI SI (MISCELLANEOUS)
KIT PROCEDURE DVNC SI (MISCELLANEOUS) IMPLANT
MANIPULATOR UTERINE 4.5 ZUMI (MISCELLANEOUS) ×3 IMPLANT
NDL SPNL 18GX3.5 QUINCKE PK (NEEDLE) IMPLANT
NEEDLE SPNL 18GX3.5 QUINCKE PK (NEEDLE) IMPLANT
OBTURATOR OPTICAL STANDARD 8MM (TROCAR) ×1
OBTURATOR OPTICAL STND 8 DVNC (TROCAR) ×2
OBTURATOR OPTICALSTD 8 DVNC (TROCAR) ×2 IMPLANT
PACK ROBOT GYN CUSTOM WL (TRAY / TRAY PROCEDURE) ×3 IMPLANT
PAD POSITIONING PINK XL (MISCELLANEOUS) ×3 IMPLANT
PORT ACCESS TROCAR AIRSEAL 12 (TROCAR) ×2 IMPLANT
PORT ACCESS TROCAR AIRSEAL 5M (TROCAR) ×1
POUCH SPECIMEN RETRIEVAL 10MM (ENDOMECHANICALS) IMPLANT
SEAL CANN UNIV 5-8 DVNC XI (MISCELLANEOUS) ×8 IMPLANT
SEAL XI 5MM-8MM UNIVERSAL (MISCELLANEOUS) ×4
SET TRI-LUMEN FLTR TB AIRSEAL (TUBING) ×3 IMPLANT
SURGIFLO W/THROMBIN 8M KIT (HEMOSTASIS) IMPLANT
SUT PDS AB 1 CT1 27 (SUTURE) ×2 IMPLANT
SUT VIC AB 0 CT1 27 (SUTURE) ×9
SUT VIC AB 0 CT1 27XBRD ANTBC (SUTURE) ×3 IMPLANT
SUT VIC AB 3-0 SH 27 (SUTURE) ×6
SUT VIC AB 3-0 SH 27XBRD (SUTURE) ×2 IMPLANT
SYR 10ML LL (SYRINGE) IMPLANT
TOWEL OR NON WOVEN STRL DISP B (DISPOSABLE) ×3 IMPLANT
TRAP SPECIMEN MUCOUS 40CC (MISCELLANEOUS) IMPLANT
TRAY FOLEY MTR SLVR 16FR STAT (SET/KITS/TRAYS/PACK) ×3 IMPLANT
UNDERPAD 30X30 (UNDERPADS AND DIAPERS) ×3 IMPLANT
WATER STERILE IRR 1000ML POUR (IV SOLUTION) ×3 IMPLANT

## 2018-06-01 NOTE — Op Note (Signed)
OPERATIVE NOTE 06/01/18  Surgeon: Donaciano Eva   Assistants: Dr Lahoma Crocker (an MD assistant was necessary for tissue manipulation, management of robotic instrumentation, retraction and positioning due to the complexity of the case and hospital policies).   Anesthesia: General endotracheal anesthesia  ASA Class: 2   Pre-operative Diagnosis: history of cervical cancer, VAIN III of upper posterior vagina  Post-operative Diagnosis: same  Operation: Robotic-assisted laparoscopic total hysterectomy with bilateral salpingectomy with upper vaginectomy and oophoropexy  Surgeon: Donaciano Eva  Assistant Surgeon: Lahoma Crocker MD  Anesthesia: GET  Urine Output: 300  Operative Findings:  : 6cm uterus with normal tubes and ovaries (benign appearing functional cyst on left ovary measuring 2cm). Cerclage (merseline) in place with knot anteriorally. Bladder flap adhesions. 3x3cm discoid raised erythematous lesion on posterior upper wall of vagina with close proximity to cervix. No visible lesion remaining in vagina at completion of procedure.  Estimated Blood Loss:  25cc      Total IV Fluids: 800 ml         Specimens: uterus with cervix, bilateral fallopian tubes and upper posterior vagina         Complications:  None; patient tolerated the procedure well.         Disposition: PACU - hemodynamically stable.  Procedure Details  The patient was seen in the Holding Room. The risks, benefits, complications, treatment options, and expected outcomes were discussed with the patient.  The patient concurred with the proposed plan, giving informed consent.  The site of surgery properly noted/marked. The patient was identified as Kelly Mcclure and the procedure verified as a Robotic-assisted hysterectomy with bilateral salpingectomy and upper vaginectomy. A Time Out was held and the above information confirmed.  After induction of anesthesia, the patient was draped and  prepped in the usual sterile manner. Pt was placed in supine position after anesthesia and draped and prepped in the usual sterile manner. The abdominal drape was placed after the CholoraPrep had been allowed to dry for 3 minutes.  Her arms were tucked to her side with all appropriate precautions.  The shoulders were stabilized with padded shoulder blocks applied to the acromium processes.  The patient was placed in the semi-lithotomy position in [place].  The perineum was prepped with Betadine. The patient was then prepped. Foley catheter was placed.  A sterile speculum was placed in the vagina.  The lesion was identified on the posterior wall of the vagina. An EEA sizer was placed in the vagina.  OG tube placement was confirmed and to suction.   Next, a 5 mm skin incision was made 1 cm below the subcostal margin in the midclavicular line.  The 5 mm Optiview port and scope was used for direct entry.  Opening pressure was under 10 mm CO2.  The abdomen was insufflated and the findings were noted as above.   At this point and all points during the procedure, the patient's intra-abdominal pressure did not exceed 15 mmHg. Next, a 10 mm skin incision was made in the umbilicus and a right and left port was placed about 10 cm lateral to the robot port on the right and left side.  A fourth arm was placed in the left lower quadrant 2 cm above and superior and medial to the anterior superior iliac spine.  All ports were placed under direct visualization. An omental adhesion to the umbilical hernia was sharply dissected down with laparoscopic scissors.  The patient was placed in steep Trendelenburg.  Bowel  was folded away into the upper abdomen.  The robot was docked in the normal manner.  The hysterectomy was started after the round ligament on the right side was incised and the retroperitoneum was entered and the pararectal space was developed.  The ureter was noted to be on the medial leaf of the broad ligament.   The peritoneum above the ureter was incised and stretched and the utero-ovarian ligament was skeletonized, cauterized and cut. The fallopian tube was dissected free from its attachments to the right ovary and kept part of the uterine specimen.  The posterior peritoneum was taken down and using meticulous sharp dissection a rectovaginal septum was developed with the scissors. This was carefully dissected using sharp dissection in the midline posteriorally to the level of the levator muscles.  The anterior peritoneum was also taken down.  The bladder flap was created with sharp dissection and judicious use of energy to seal vessels. The abdominal cerclage was seen on the lower uterine segment and this contributed to adhesions, however the bladder flap was created without injury to the bladder or difficulty.  The bladder flap was extensively taken down to the the mid portion of the vagina using the EEA sizer as a guide. The uterine artery on the right side was skeletonized, cauterized and cut at the level of the uterine isthmus. The bipolar graspers and monopolar shears were used to incise the cardinal ligament on the right to drop the vascular pedicles and bladder anteriorally and laterally. The lateral vagina was tubularized distally enough that there was adequate distance created to ensure clearance of the posterior vaginal lesion. A similar procedure was performed on the left.  The colpotomy was made anteriorally and the EEA was retracted. This anterior colpotomy was immediately distal to the anterior cervix to preserve vaginal length. The EEA was retracted to expose the vaginal mucosa posteriorally which allowed for identification of hte lesion. An image of the was taken for the EMR. A distal margin was selected and scored with the shears, 1cm beyond the visible vaginal lesion. The posterior colpotomy was then completed from lateral to midline approach with visualization of the rectum posteriorally at all time.  There was considerable space posteriorally that had been opened to drop the rectum at least 5cm below the level of the posterior colpotomy. The uterus, cervix, bilateral tubes and upper vagina were amputated and delivered through the vagina.  Pedicles were inspected and excellent hemostasis was achieved.    The colpotomy at the vaginal cuff was closed with Vicryl on a CT1 needle in a running manner. The posterior peritoneal incision for the rectovaginal septum, this was reapproximated to the vaginal cuff posteriorally to close this posterior peritoneal defect and cover the anterior wall of the rectum with peritoneum.   The ovaries bilaterally were pexed to the lateral side wall abdominal peritoneum above the level of the pelvic brim (in case later radiation is necessary for a recurrence of her cervical cancer).  Irrigation was used and excellent hemostasis was achieved.  At this point in the procedure was completed.  Robotic instruments were removed.  The robot was undocked. The umbilicus and LUQ 10 mm port were closed with 0PDS at the fascia.  The skin was closed with 4-0 Vicryl in a subcuticular manner.  Dermabond was applied.  Sponge, lap and needle counts correct x 2.  The patient was taken to the recovery room in stable condition.  The vagina was swabbed with  minimal bleeding noted.  Vaginal length was  good (at least 10cm). No visible lesions remained in the vagina with speculum inspection.  All instrument and needle counts were correct x  3.   The patient was transferred to the recovery room in a stable condition.  Donaciano Eva, MD

## 2018-06-01 NOTE — Anesthesia Procedure Notes (Signed)
Procedure Name: Intubation Date/Time: 06/01/2018 7:37 AM Performed by: Ameena Vesey D, CRNA Pre-anesthesia Checklist: Patient identified, Emergency Drugs available, Suction available and Patient being monitored Patient Re-evaluated:Patient Re-evaluated prior to induction Oxygen Delivery Method: Circle system utilized Preoxygenation: Pre-oxygenation with 100% oxygen Induction Type: IV induction Ventilation: Mask ventilation without difficulty Laryngoscope Size: Mac and 4 Grade View: Grade I Tube type: Oral Tube size: 7.5 mm Number of attempts: 1 Airway Equipment and Method: Stylet Placement Confirmation: ETT inserted through vocal cords under direct vision,  positive ETCO2 and breath sounds checked- equal and bilateral Secured at: 21 cm Tube secured with: Tape Dental Injury: Teeth and Oropharynx as per pre-operative assessment

## 2018-06-01 NOTE — Transfer of Care (Signed)
Immediate Anesthesia Transfer of Care Note  Patient: Kelly Mcclure  Procedure(s) Performed: XI ROBOTIC ASSISTED TOTAL HYSTERECTOMY WITH BILATERAL  SALPINGECTOMY (Bilateral ) VAGINECTOMY, UPPER (N/A )  Patient Location: PACU  Anesthesia Type:General  Level of Consciousness: awake, alert  and oriented  Airway & Oxygen Therapy: Patient Spontanous Breathing and Patient connected to face mask oxygen  Post-op Assessment: Report given to RN and Post -op Vital signs reviewed and stable  Post vital signs: Reviewed and stable  Last Vitals:  Vitals Value Taken Time  BP 148/101 06/01/2018 10:02 AM  Temp    Pulse 103 06/01/2018 10:02 AM  Resp 18 06/01/2018 10:03 AM  SpO2 97 % 06/01/2018 10:02 AM  Vitals shown include unvalidated device data.  Last Pain:  Vitals:   06/01/18 0604  TempSrc:   PainSc: 3       Patients Stated Pain Goal: 4 (96/04/54 0981)  Complications: No apparent anesthesia complications

## 2018-06-01 NOTE — Anesthesia Preprocedure Evaluation (Signed)
Anesthesia Evaluation  Patient identified by MRN, date of birth, ID band Patient awake    Reviewed: Allergy & Precautions, NPO status , Patient's Chart, lab work & pertinent test results  Airway Mallampati: II  TM Distance: >3 FB Neck ROM: Full    Dental no notable dental hx.    Pulmonary neg pulmonary ROS,    Pulmonary exam normal breath sounds clear to auscultation       Cardiovascular negative cardio ROS Normal cardiovascular exam Rhythm:Regular Rate:Normal     Neuro/Psych Seizures -, Well Controlled,  negative psych ROS   GI/Hepatic negative GI ROS, Neg liver ROS,   Endo/Other  Hypothyroidism Hyperthyroidism   Renal/GU negative Renal ROS  negative genitourinary   Musculoskeletal negative musculoskeletal ROS (+)   Abdominal   Peds negative pediatric ROS (+)  Hematology negative hematology ROS (+)   Anesthesia Other Findings   Reproductive/Obstetrics negative OB ROS                             Anesthesia Physical Anesthesia Plan  ASA: II  Anesthesia Plan: General   Post-op Pain Management:    Induction: Intravenous  PONV Risk Score and Plan: 3 and Ondansetron, Dexamethasone, Midazolam, Treatment may vary due to age or medical condition and Scopolamine patch - Pre-op  Airway Management Planned: Oral ETT  Additional Equipment:   Intra-op Plan:   Post-operative Plan: Extubation in OR  Informed Consent: I have reviewed the patients History and Physical, chart, labs and discussed the procedure including the risks, benefits and alternatives for the proposed anesthesia with the patient or authorized representative who has indicated his/her understanding and acceptance.   Dental advisory given  Plan Discussed with: CRNA and Surgeon  Anesthesia Plan Comments:         Anesthesia Quick Evaluation

## 2018-06-01 NOTE — Anesthesia Postprocedure Evaluation (Signed)
Anesthesia Post Note  Patient: JENISHA FAISON  Procedure(s) Performed: XI ROBOTIC ASSISTED TOTAL HYSTERECTOMY WITH BILATERAL  SALPINGECTOMY (Bilateral ) VAGINECTOMY, UPPER (N/A )     Patient location during evaluation: PACU Anesthesia Type: General Level of consciousness: awake and alert Pain management: pain level controlled Vital Signs Assessment: post-procedure vital signs reviewed and stable Respiratory status: spontaneous breathing, nonlabored ventilation, respiratory function stable and patient connected to nasal cannula oxygen Cardiovascular status: blood pressure returned to baseline and stable Postop Assessment: no apparent nausea or vomiting Anesthetic complications: no    Last Vitals:  Vitals:   06/01/18 1201 06/01/18 1300  BP: 132/83 129/78  Pulse: 100 93  Resp: 16 17  Temp:    SpO2: 95% 97%    Last Pain:  Vitals:   06/01/18 1300  TempSrc:   PainSc: 0-No pain                 Laurna Shetley S

## 2018-06-01 NOTE — Interval H&P Note (Signed)
History and Physical Interval Note:  06/01/2018 6:59 AM  Kelly Mcclure  has presented today for surgery, with the diagnosis of UPPER VAGINAL DYSPLASIA  The various methods of treatment have been discussed with the patient and family. After consideration of risks, benefits and other options for treatment, the patient has consented to  Procedure(s): XI ROBOTIC ASSISTED TOTAL HYSTERECTOMY WITH BILATERAL  SALPINGECTOMY (Bilateral) VAGINECTOMY, UPPER (N/A) as a surgical intervention .  The patient's history has been reviewed, patient examined, no change in status, stable for surgery.  I have reviewed the patient's chart and labs.  Questions were answered to the patient's satisfaction.     Thereasa Solo

## 2018-06-02 DIAGNOSIS — D072 Carcinoma in situ of vagina: Secondary | ICD-10-CM | POA: Diagnosis not present

## 2018-06-02 LAB — CBC
HCT: 41.6 % (ref 36.0–46.0)
Hemoglobin: 12.7 g/dL (ref 12.0–15.0)
MCH: 25.5 pg — AB (ref 26.0–34.0)
MCHC: 30.5 g/dL (ref 30.0–36.0)
MCV: 83.4 fL (ref 80.0–100.0)
PLATELETS: 255 10*3/uL (ref 150–400)
RBC: 4.99 MIL/uL (ref 3.87–5.11)
RDW: 13.9 % (ref 11.5–15.5)
WBC: 12.3 10*3/uL — ABNORMAL HIGH (ref 4.0–10.5)
nRBC: 0 % (ref 0.0–0.2)

## 2018-06-02 LAB — BASIC METABOLIC PANEL
Anion gap: 6 (ref 5–15)
BUN: 9 mg/dL (ref 6–20)
CO2: 23 mmol/L (ref 22–32)
CREATININE: 0.73 mg/dL (ref 0.44–1.00)
Calcium: 8.5 mg/dL — ABNORMAL LOW (ref 8.9–10.3)
Chloride: 110 mmol/L (ref 98–111)
GFR calc Af Amer: >60 mL/min (ref >60)
GLUCOSE: 103 mg/dL — AB (ref 70–99)
POTASSIUM: 3.8 mmol/L (ref 3.5–5.1)
Sodium: 139 mmol/L (ref 135–145)

## 2018-06-02 MED ORDER — OXYCODONE HCL 5 MG PO TABS: 5.0000 mg | ORAL_TABLET | ORAL | 0 refills | Status: DC | PRN

## 2018-06-02 MED ORDER — SENNOSIDES-DOCUSATE SODIUM 8.6-50 MG PO TABS: 2.0000 | ORAL_TABLET | Freq: Every day | ORAL | 1 refills | Status: DC

## 2018-06-02 MED ORDER — IBUPROFEN 800 MG PO TABS: 800.0000 mg | ORAL_TABLET | Freq: Three times a day (TID) | ORAL | 0 refills | Status: DC | PRN

## 2018-06-02 NOTE — Discharge Summary (Signed)
Physician Discharge Summary  Patient ID: Kelly Mcclure MRN: [id number] DOB/AGE: 11/23/1980 37 y.o.  Admit date: 06/01/2018 Discharge date: 06/02/2018  Admission Diagnoses: VAIN III (vaginal intraepithelial neoplasia grade III)  Discharge Diagnoses:  Principal Problem:   VAIN III (vaginal intraepithelial neoplasia grade III) Active Problems:   Cervix cancer ([place])   Discharged Condition:  The patient is in good condition and stable for discharge.   Hospital Course: On 06/01/2018, the patient underwent the following: Procedure(s): XI ROBOTIC ASSISTED TOTAL HYSTERECTOMY WITH BILATERAL  SALPINGECTOMY VAGINECTOMY, UPPER.  The postoperative course was uneventful.  She was discharged to home on postoperative day 1 tolerating a regular diet, ambulating, pain controlled, voiding.  Consults: None  Significant Diagnostic Studies: None  Treatments: surgery: see above  Discharge Exam: Blood pressure 108/73, pulse 79, temperature 98.6 F (37 C), temperature source Oral, resp. rate 20, height 5\' 5"  (1.651 m), weight 162 lb (73.5 kg), last menstrual period 05/17/2018, SpO2 97 %, not currently breastfeeding. General appearance: alert, cooperative and no distress Resp: clear to auscultation bilaterally Cardio: regular rate and rhythm, S1, S2 normal, no murmur, click, rub or gallop GI: soft, non-tender; bowel sounds normal; no masses,  no organomegaly Extremities: extremities normal, atraumatic, no cyanosis or edema Incision/Wound: Lap sites to the abdomen with dermabond without erythema or drainage  Disposition: Discharge disposition: 01-Home or Self Care       Discharge Instructions    Call MD for:  difficulty breathing, headache or visual disturbances   Complete by:  As directed    Call MD for:  extreme fatigue   Complete by:  As directed    Call MD for:  hives   Complete by:  As directed    Call MD for:  persistant dizziness or light-headedness   Complete by:  As directed    Call MD for:  persistant nausea and vomiting   Complete by:  As directed    Call MD for:  redness, tenderness, or signs of infection (pain, swelling, redness, odor or green/yellow discharge around incision site)   Complete by:  As directed    Call MD for:  severe uncontrolled pain   Complete by:  As directed    Call MD for:  temperature >100.4   Complete by:  As directed    Diet - low sodium heart healthy   Complete by:  As directed    Driving Restrictions   Complete by:  As directed    No driving for 1 week.  Do not take narcotics and drive.   Increase activity slowly   Complete by:  As directed    Lifting restrictions   Complete by:  As directed    No lifting greater than 10 lbs.   Sexual Activity Restrictions   Complete by:  As directed    No sexual activity, nothing in the vagina, for 12 weeks.     Allergies as of 06/02/2018      Reactions   Sulfa Antibiotics Swelling   Tongue swelling, throat irritated   Adhesive [tape] Other (See Comments)   "skin burn"   Other Swelling, Other (See Comments)   Magic mouth wash/ causes swelling of the tongue   Relpax [eletriptan Hydrobromide] Other (See Comments)   Stroke-like symptoms   Ultram [tramadol] Other (See Comments)   2004--  Per pt had seizure due to interaction with other medication she was taking at the time      Medication List    TAKE these medications   albuterol  108 (90 Base) MCG/ACT inhaler Commonly known as:  PROVENTIL HFA;VENTOLIN HFA Inhale 2 puffs into the lungs every 6 (six) hours as needed for wheezing or shortness of breath.   ALPRAZolam 0.5 MG tablet Commonly known as:  XANAX Take 1 tablet (0.5 mg total) by mouth at bedtime as needed for anxiety.   butalbital-acetaminophen-caffeine 50-325-40 MG tablet Commonly known as:  FIORICET, ESGIC Take 1-2 tablets by mouth every 4 (four) hours as needed for migraine.   CENTRUM WOMEN Tabs Take 1 tablet by mouth daily.   FLUoxetine 40 MG capsule Commonly known  as:  PROZAC Take 40 mg by mouth every morning.   ibuprofen 800 MG tablet Commonly known as:  ADVIL,MOTRIN Take 1 tablet (800 mg total) by mouth every 8 (eight) hours as needed.   LamoTRIgine 200 MG Tb24 24 hour tablet Take 2 tablets (400 mg total) by mouth 2 (two) times daily. What changed:  additional instructions   LamoTRIgine 100 MG Tb24 24 hour tablet Take 1 tablet (100 mg total) by mouth at bedtime. What changed:  Another medication with the same name was changed. Make sure you understand how and when to take each.   levETIRAcetam 750 MG tablet Commonly known as:  KEPPRA Take 2 tablets (1,500 mg total) by mouth 2 (two) times daily.   loratadine 10 MG tablet Commonly known as:  CLARITIN Take 10 mg by mouth every morning.   oxyCODONE 5 MG immediate release tablet Commonly known as:  Oxy IR/ROXICODONE Take 1 tablet (5 mg total) by mouth every 4 (four) hours as needed for severe pain.   senna-docusate 8.6-50 MG tablet Commonly known as:  Senokot-S Take 2 tablets by mouth at bedtime.   SYNTHROID 150 MCG tablet Generic drug:  levothyroxine Take 1 tablet (150 mcg total) by mouth daily before breakfast.      Follow-up Information    [name], MD Follow up on 06/21/2018.   Specialty:  Gynecologic Oncology Why:  at 2:15pm at the [facility] information: [place] [address] [phone]           Greater than thirty minutes were spend for face to face discharge instructions and discharge orders/summary in EPIC.   Signed: Dorothyann Gibbs 06/02/2018, 11:25 AM

## 2018-06-02 NOTE — Discharge Instructions (Addendum)
06/02/2018  Return to work: 4-6 weeks if applicable  Activity: 1. Be up and out of the bed during the day.  Take a nap if needed.  You may walk up steps but be careful and use the hand rail.  Stair climbing will tire you more than you think, you may need to stop part way and rest.   2. No lifting or straining for 6 weeks.  3. No driving for 1 week(s).  Do not drive if you are taking narcotic pain medicine.  4. Shower daily.  Use soap and water on your incision and pat dry; don't rub.  No tub baths until cleared by your surgeon.   5. No sexual activity and nothing in the vagina for 12 weeks.  6. You may experience a small amount of clear drainage from your incisions, which is normal.  If the drainage persists or increases, please call the office.  7. You may experience vaginal spotting after surgery or around the 6-8 week mark from surgery when the stitches at the top of the vagina begin to dissolve.  The spotting is normal but if you experience heavy bleeding, call our office.  8. Take Tylenol or ibuprofen first for pain and only use oxycodone for severe pain not relieved by the Tylenol or Ibuprofen.  Monitor your Tylenol intake to a max of 4,000 mg a day.  Diet: 1. Low sodium Heart Healthy Diet is recommended.  2. It is safe to use a laxative, such as Miralax or Colace, if you have difficulty moving your bowels. You can take Sennakot at bedtime every evening to keep bowel movements regular and to prevent constipation and stop taking if having loose stools.    Wound Care: 1. Keep clean and dry.  Shower daily.  Reasons to call the Doctor:  Fever - Oral temperature greater than 100.4 degrees Fahrenheit  Foul-smelling vaginal discharge  Difficulty urinating  Nausea and vomiting  Increased pain at the site of the incision that is unrelieved with pain medicine.  Difficulty breathing with or without chest pain  New calf pain especially if only on one side  Sudden, continuing  increased vaginal bleeding with or without clots.   Contacts: For questions or concerns you should contact:  Dr. [name] at [phone]  [name], NP at [phone]  After Hours: call [phone] and have the GYN Oncologist paged/contacted  Oxycodone tablets or capsules What is this medicine? OXYCODONE (ox i KOE done) is a pain reliever. It is used to treat moderate to severe pain. This medicine may be used for other purposes; ask your health care provider or pharmacist if you have questions. COMMON BRAND NAME(S): Dazidox, Endocodone, Oxaydo, OXECTA, OxyIR, Percolone, Roxicodone, ROXYBOND What should I tell my health care provider before I take this medicine? They need to know if you have any of these conditions: -Addison's disease -brain tumor -head injury -heart disease -history of drug or alcohol abuse problem -if you often drink alcohol -kidney disease -liver disease -lung or breathing disease, like asthma -mental illness -pancreatic disease -seizures -thyroid disease -an unusual or allergic reaction to oxycodone, codeine, hydrocodone, morphine, other medicines, foods, dyes, or preservatives -pregnant or trying to get pregnant -breast-feeding How should I use this medicine? Take this medicine by mouth with a glass of water. Follow the directions on the prescription label. You can take it with or without food. If it upsets your stomach, take it with food. Take your medicine at regular intervals. Do not take it more  often than directed. Do not stop taking except on your doctor's advice. Some brands of this medicine, like Oxecta, have special instructions. Ask your doctor or pharmacist if these directions are for you: Do not cut, crush or chew this medicine. Swallow only one tablet at a time. Do not wet, soak, or lick the tablet before you take it. A special MedGuide will be given to you by the pharmacist with each prescription and refill. Be sure to read this  information carefully each time. Talk to your pediatrician regarding the use of this medicine in children. Special care may be needed. Overdosage: If you think you have taken too much of this medicine contact a poison control center or emergency room at once. NOTE: This medicine is only for you. Do not share this medicine with others. What if I miss a dose? If you miss a dose, take it as soon as you can. If it is almost time for your next dose, take only that dose. Do not take double or extra doses. What may interact with this medicine? This medicine may interact with the following medications: -alcohol -antihistamines for allergy, cough and cold -antiviral medicines for HIV or AIDS -atropine -certain antibiotics like clarithromycin, erythromycin, linezolid, rifampin -certain medicines for anxiety or sleep -certain medicines for bladder problems like oxybutynin, tolterodine -certain medicines for depression like amitriptyline, fluoxetine, sertraline -certain medicines for fungal infections like ketoconazole, itraconazole, voriconazole -certain medicines for migraine headache like almotriptan, eletriptan, frovatriptan, naratriptan, rizatriptan, sumatriptan, zolmitriptan -certain medicines for nausea or vomiting like dolasetron, ondansetron, palonosetron -certain medicines for Parkinson's disease like benztropine, trihexyphenidyl -certain medicines for seizures like phenobarbital, phenytoin, primidone -certain medicines for stomach problems like dicyclomine, hyoscyamine -certain medicines for travel sickness like scopolamine -diuretics -general anesthetics like halothane, isoflurane, methoxyflurane, propofol -ipratropium -local anesthetics like lidocaine, pramoxine, tetracaine -MAOIs like Carbex, Eldepryl, Marplan, Nardil, and Parnate -medicines that relax muscles for surgery -methylene blue -nilotinib -other narcotic medicines for pain or cough -phenothiazines like chlorpromazine,  mesoridazine, prochlorperazine, thioridazine This list may not describe all possible interactions. Give your health care provider a list of all the medicines, herbs, non-prescription drugs, or dietary supplements you use. Also tell them if you smoke, drink alcohol, or use illegal drugs. Some items may interact with your medicine. What should I watch for while using this medicine? Tell your doctor or health care professional if your pain does not go away, if it gets worse, or if you have new or a different type of pain. You may develop tolerance to the medicine. Tolerance means that you will need a higher dose of the medicine for pain relief. Tolerance is normal and is expected if you take this medicine for a long time. Do not suddenly stop taking your medicine because you may develop a severe reaction. Your body becomes used to the medicine. This does NOT mean you are addicted. Addiction is a behavior related to getting and using a drug for a non-medical reason. If you have pain, you have a medical reason to take pain medicine. Your doctor will tell you how much medicine to take. If your doctor wants you to stop the medicine, the dose will be slowly lowered over time to avoid any side effects. There are different types of narcotic medicines (opiates). If you take more than one type at the same time or if you are taking another medicine that also causes drowsiness, you may have more side effects. Give your health care provider a list of all medicines you use.  Your doctor will tell you how much medicine to take. Do not take more medicine than directed. Call emergency for help if you have problems breathing or unusual sleepiness. You may get drowsy or dizzy. Do not drive, use machinery, or do anything that needs mental alertness until you know how the medicine affects you. Do not stand or sit up quickly, especially if you are an older patient. This reduces the risk of dizzy or fainting spells. Alcohol may  interfere with the effect of this medicine. Avoid alcoholic drinks. This medicine will cause constipation. Try to have a bowel movement at least every 2 to 3 days. If you do not have a bowel movement for 3 days, call your doctor or health care professional. Your mouth may get dry. Chewing sugarless gum or sucking hard candy, and drinking plenty of water may help. Contact your doctor if the problem does not go away or is severe. What side effects may I notice from receiving this medicine? Side effects that you should report to your doctor or health care professional as soon as possible: -allergic reactions like skin rash, itching or hives, swelling of the face, lips, or tongue -breathing problems -confusion -signs and symptoms of low blood pressure like dizziness; feeling faint or lightheaded, falls; unusually weak or tired -trouble passing urine or change in the amount of urine -trouble swallowing Side effects that usually do not require medical attention (report to your doctor or health care professional if they continue or are bothersome): -constipation -dry mouth -nausea, vomiting -tiredness This list may not describe all possible side effects. Call your doctor for medical advice about side effects. You may report side effects to FDA at 1-800-FDA-1088. Where should I keep my medicine? Keep out of the reach of children. This medicine can be abused. Keep your medicine in a safe place to protect it from theft. Do not share this medicine with anyone. Selling or giving away this medicine is dangerous and against the law. Store at room temperature between 15 and 30 degrees C (59 and 86 degrees F). Protect from light. Keep container tightly closed. This medicine may cause accidental overdose and death if it is taken by other adults, children, or pets. Flush any unused medicine down the toilet to reduce the chance of harm. Do not use the medicine after the expiration date. NOTE: This sheet is a  summary. It may not cover all possible information. If you have questions about this medicine, talk to your doctor, pharmacist, or health care provider.  2018 Elsevier/Gold Standard (2015-06-05 16:55:57)

## 2018-06-03 ENCOUNTER — Encounter: Payer: Self-pay | Admitting: Gynecologic Oncology

## 2018-06-03 ENCOUNTER — Encounter: Payer: Self-pay | Admitting: Oncology

## 2018-06-03 ENCOUNTER — Telehealth: Payer: Self-pay | Admitting: Gynecologic Oncology

## 2018-06-03 ENCOUNTER — Telehealth: Payer: Self-pay | Admitting: Oncology

## 2018-06-03 DIAGNOSIS — C52 Malignant neoplasm of vagina: Secondary | ICD-10-CM

## 2018-06-03 DIAGNOSIS — C539 Malignant neoplasm of cervix uteri, unspecified: Secondary | ICD-10-CM

## 2018-06-03 NOTE — Telephone Encounter (Signed)
Informed Kelly Mcclure of findings of stage IC vaginal cancer.  Discussed positive deep margin due to cancer "touching' the anterior rectal wall as there is no physiologic tissue between these structures to keep them separated. Discussed that due to this relationship and the concern that there is residual microscopic disease, (in addition to the lymphovascular invasion on pathology), we are recommending adjuvant chemoradiation with external beam and likely brachy, and weekly cddp. We will first obtain PET but will wait 4 weeks postop to do this. Will facilitate referrals with med onc and rad onc.  Discussed treatment is curative intent, and if PET confirms no gross metastatic disease, then chance of cure is high with adjuvant therapy.  DIscussed that her ovaries were "pexed" to above the pelvic brim (see Epic images) however, this does not guarantee that they will be spared from radiation, and premature menopause is a possibility.   Thereasa Solo, MD

## 2018-06-03 NOTE — Progress Notes (Signed)
Called Kelly Mcclure and advised her of appointment with Dr. Sondra Come on 06/14/18 at 1:00 pm to see the nurse and 1:30 consult.  She verbalized understanding and agreement.

## 2018-06-03 NOTE — Telephone Encounter (Signed)
Called patient with appointment with Dr. Alvy Bimler on 07/08/18 at 2 pm.  She verbalized understanding and agreement.

## 2018-06-08 ENCOUNTER — Encounter: Payer: Self-pay | Admitting: Gynecologic Oncology

## 2018-06-08 NOTE — Progress Notes (Signed)
GYN Location of Tumor / Histology: Informed Kelly Mcclure of findings of stage IC vaginal cancer.  Discussed positive deep margin due to cancer "touching' the anterior rectal wall as there is no physiologic tissue between these structures to keep them separated. Discussed that due to this relationship and the concern that there is residual microscopic disease, (in addition to the lymphovascular invasion on pathology), we are recommending adjuvant chemoradiation with external beam and likely brachy, and weekly cddp. We will first obtain PET but will wait 4 weeks postop to do this. Will facilitate referrals with med onc and rad onc.  Discussed treatment is curative intent, and if PET confirms no gross metastatic disease, then chance of cure is high with adjuvant therapy.  DIscussed that her ovaries were "pexed" to above the pelvic brim (see Epic images) however, this does not guarantee that they will be spared from radiation, and premature menopause is a possibility.   Thereasa Solo, MD  Loralie Champagne presented with symptoms of: Recent Pap smear showing HGSIL and negative endocervical curettage.  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 - CARCINOMA INVOLVES THE CAUTERIZED DEEP RESECTION MARGIN; MUCOSAL MARGINS ARE NOT INVOLVED. - LYMPHOVASCULAR INVASION IS PRESENT. - UTERUS WITH BENIGN PROLIFERATIVE ENDOMETRIUM. - BENIGN UNREMARKABLE CERVIX. - BENIGN UNREMARKABLE BILATERAL FALLOPIAN TUBE. - SEE ONCOLOGY TABLE. - SEE NOTE.  Past/Anticipated interventions by Gyn/Onc surgery, if any: 06/01/18:  Operation: Robotic-assisted laparoscopic total hysterectomy with bilateral salpingectomy with upper vaginectomy and oophoropexy  Surgeon: Donaciano Eva  Past/Anticipated interventions by medical oncology, if any: Initial consult with Dr. Alvy Bimler 07/08/18  Weight changes, if any: No  Bowel/Bladder complaints, if any: Pt reports incomplete emptying initially but will wait until completely emptying to finish toileting.   Nausea/Vomiting, if any: Pt reports occasional nausea without vomiting.   Pain issues, if any: Pt reports intermittent pain she attributes to surgery.   SAFETY ISSUES:  Prior radiation? No  Pacemaker/ICD? No  Possible current pregnancy? No, pt is post surgical  Is the patient on methotrexate? No  Current Complaints / other details:  Pt presents today for initial consult with Dr. Sondra Come for Radiation Oncology. Pt is accompanied by mother. Pt scored a 5/10 on initial distress screening. SW consult ordered per protocol.  BP (!) 121/92 (BP Location: Right Arm, Patient Position: Sitting)   Pulse 88   Temp 98.6 F (37 C) (Oral)   Resp 18   Ht 5\' 6"  (1.676 m)   Wt 161 lb (73 kg)   LMP 05/17/2018 (Exact Date)   SpO2 97%   BMI 25.99 kg/m   Wt Readings from Last 3 Encounters:  06/14/18 161 lb (73 kg)  06/01/18 162 lb (73.5 kg)  05/24/18 160 lb 2 oz (72.6 kg)   Loma Sousa, RN BSN

## 2018-06-11 ENCOUNTER — Other Ambulatory Visit: Payer: Self-pay | Admitting: Allergy and Immunology

## 2018-06-12 ENCOUNTER — Encounter: Payer: Self-pay | Admitting: Gynecologic Oncology

## 2018-06-14 ENCOUNTER — Other Ambulatory Visit: Payer: Self-pay

## 2018-06-14 ENCOUNTER — Encounter: Payer: Self-pay | Admitting: Oncology

## 2018-06-14 ENCOUNTER — Ambulatory Visit
Admission: RE | Admit: 2018-06-14 | Discharge: 2018-06-14 | Disposition: A | Discharge status: Home / Self Care | Payer: 59 | Source: Ambulatory Visit | Attending: Radiation Oncology | Admitting: Radiation Oncology

## 2018-06-14 ENCOUNTER — Encounter: Payer: Self-pay | Admitting: Radiation Oncology

## 2018-06-14 VITALS — BP 121/92 | HR 88 | Temp 98.6°F | Resp 18 | Ht 66.0 in | Wt 161.0 lb

## 2018-06-14 DIAGNOSIS — Z79899 Other long term (current) drug therapy: Secondary | ICD-10-CM | POA: Insufficient documentation

## 2018-06-14 DIAGNOSIS — C52 Malignant neoplasm of vagina: Secondary | ICD-10-CM | POA: Insufficient documentation

## 2018-06-14 DIAGNOSIS — D072 Carcinoma in situ of vagina: Secondary | ICD-10-CM

## 2018-06-14 NOTE — Progress Notes (Signed)
Radiation Oncology         [phone] ________________________________  Initial Outpatient Consultation  Name: Kelly Mcclure MRN: [id number]    Date: 06/14/2018  DOB: Aug 27, 1980  YD:XAJOIN, Kelly Lovett, MD  Kelly Amber, MD   REFERRING PHYSICIAN: [name], MD  DIAGNOSIS: Vaginal cancer Stage pT1b, pNX, invasive moderately differentiated squamous cell carcinoma  HISTORY OF PRESENT ILLNESS::Kelly Mcclure is a 37 y.o. female who is presenting to the office today for evaluation with a new diagnosis of vaginal cancer. She is accompanied by her mother.   She underwent a total hysterectomy with bilateral salpingectomy with upper vaginectomy and oophoropexy on 06/01/18. The pathology report showed Uterus +/- tubes/ovaries, neoplastic, upper 1/3 of vagina; invasive moderately differentiated squamous cell carcinoma, 3.0 cm, involving posterior wall of upper third of vagina; carcinoma involves the cauterized deep resection margin, mucosal margins are not involved; lymphovascular invasion is present; uterus with benign proliferative endometrium; benign unremarkable cervix; benign unremarkable bilateral fallopian tube.  On 04/27/18, she had a biopsy of her posterior vagina which showed superficial fragments of extensive squamous cell carcinoma in situ.  On 03/11/18, a biopsy of the posterior upper vaginal wall showed fragments of high grade squamous dysplasia.  On 02/18/18 she had a cervical cone procedure that showed benign squamous and endocervical mucosa, with no dysplasia or malignancy. She had endocervical curettage on the same day which showed blood and no mucosa present.  She underwent a cervical biopsy on 02/09/18 that showed superficial fragments of cervical mucosa showing at least high grade squamous intraepithelial lesions (HSIL, CIN-III). Definitive invasion not seen but cannot be entirely ruled out.  She has been doing well since her recent procedures. She has a one year old daughter at  home and also reports seizure disorder and asks how her medications might interact with radiation.  she reports associated mild pain following her procedure along her surgical scars, for which she is taking ibuprofen.  she denies back pain, flank pain, numbness, weakness and any other associated symptoms.    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    PAST SURGICAL HISTORY: Past Surgical History:  Procedure Laterality Date  . ABDOMINAL CERCLAGE N/A 11/19/2016   Procedure: LAPAROSCOPIC TRANSABDOMINAL CERVICAL-ISTHMUSx2;  Surgeon: Governor Specking, MD;  Location: [facility];  Service: Gynecology;  Laterality: N/A;  with ultrasound guidance. Start laparoscopically HOLD OPEN ABDOMINAL ITEMS   . CERCLAGE LAPAROSCOPIC ABDOMINAL N/A 11/19/2016   Procedure: CERCLAGE LAPAROSCOPIC ABDOMINAL;  Surgeon: Governor Specking, MD;  Location: [facility];  Service: Gynecology;  Laterality: N/A;  . CERVICAL CONIZATION W/BX N/A 02/18/2018   Procedure: COLD KNIFE CONIZATION OF CERVIX WITH POSSIBLE BIOPSY;  Surgeon: Kelly Amber, MD;  Location: [facility];  Service: Gynecology;  Laterality: N/A;  . CESAREAN SECTION N/A 05/27/2017   Procedure: Primary CESAREAN SECTION;  Surgeon: Brien Few, MD;  Location: [place];  Service: Obstetrics;  Laterality: N/A;  EDD: 11/4/18Allergy: Adhesive, Relpax, Sulfa, Ultram  . COLD KNIFE CONIZATION  03-29-2010   dr Ronita Hipps   [facility]  . DILATION AND CURETTAGE OF  UTERUS  2009  . DILATION AND CURETTAGE OF UTERUS N/A 03/01/2013   Procedure: CERVICAL DILATATION AND ENDOCERVICAL CURRETAGE AND VAAGINAL BIOPIES;  Surgeon: Alvino Chapel, MD;  Location: [facility];  Service: Gynecology;  Laterality: N/A;  . LEEP  02/25/2010;  12/ 2012  dr Ronita Hipps office  . NASAL SEPTUM SURGERY  2006   Repair of deviated septum  . SIGMOIDOSCOPY  2009  . TONSILLECTOMY AND ADENOIDECTOMY  1995    FAMILY HISTORY: family history includes AAA (abdominal aortic aneurysm) in her maternal grandfather and maternal grandmother; Asthma in her father; COPD in her maternal grandmother; Diabetes in her mother and other; Hyperlipidemia in her maternal grandmother and mother; Hypertension in her father; Leukemia in her other; Multiple myeloma in her maternal aunt; Prostate cancer in her maternal grandfather; Skin cancer in her maternal aunt and mother; Testicular cancer in her maternal uncle.  SOCIAL HISTORY:  reports that she has never smoked. She has never used smokeless tobacco. She reports that she drank alcohol. She reports that she does not use drugs.  ALLERGIES: Sulfa antibiotics; Adhesive [tape]; Other; Relpax [eletriptan hydrobromide]; and Ultram [tramadol]  MEDICATIONS:  Current Outpatient Medications  Medication Sig Dispense Refill  . albuterol (PROVENTIL HFA;VENTOLIN HFA) 108 (90 Base) MCG/ACT inhaler Inhale 2 puffs into the lungs every 6 (six) hours as needed for wheezing or shortness of breath. 1 Inhaler 1  . ALPRAZolam (XANAX) 0.5 MG tablet Take 1 tablet (0.5 mg total) by mouth at bedtime as needed for anxiety. 30 tablet 3  . butalbital-acetaminophen-caffeine (FIORICET, ESGIC) 50-325-40 MG tablet Take 1-2 tablets by mouth every 4 (four) hours as needed for migraine.     Marland Kitchen ibuprofen (ADVIL,MOTRIN) 800 MG tablet Take 1 tablet (800 mg total) by mouth every 8 (eight) hours as needed. 30 tablet 0  . LamoTRIgine (LAMICTAL XR) 200 MG TB24 24 hour  tablet Take 2 tablets (400 mg total) by mouth 2 (two) times daily. (Patient taking differently: Take 400 mg by mouth 2 (two) times daily. Takes 1000 and 2200) 360 tablet 4  . LamoTRIgine 100 MG TB24 24 hour tablet Take 1 tablet (100 mg total) by mouth at bedtime. 30 tablet 11  . levETIRAcetam (KEPPRA) 750 MG tablet Take 2 tablets (1,500 mg total) by mouth 2 (two) times daily. 360 tablet 4  . loratadine (CLARITIN) 10 MG tablet Take 10 mg by mouth every morning.     . montelukast (SINGULAIR) 10 MG tablet TAKE 1 TABLET BY MOUTH EVERYDAY AT BEDTIME 30 tablet 0  . Multiple Vitamins-Minerals (CENTRUM WOMEN) TABS Take 1 tablet by mouth daily.    Marland Kitchen oxyCODONE (OXY IR/ROXICODONE) 5 MG immediate release tablet Take 1 tablet (5 mg total) by mouth every 4 (four) hours as needed for severe pain. 15 tablet 0  . senna-docusate (SENOKOT-S) 8.6-50 MG tablet Take 2 tablets by mouth at bedtime. 30 tablet 1  . SYNTHROID 150 MCG tablet Take 1 tablet (150 mcg total) by mouth daily before breakfast. 90 tablet 3  . FLUoxetine (PROZAC) 40 MG capsule Take 40 mg by mouth every morning.     No current facility-administered medications for this encounter.     REVIEW OF SYSTEMS:  A 10+ POINT REVIEW OF SYSTEMS WAS OBTAINED including neurology, dermatology, psychiatry, cardiac, respiratory, lymph, extremities, GI, GU, musculoskeletal, constitutional, reproductive, HEENT. All pertinent positives are noted in the HPI. All others are negative.    PHYSICAL EXAM:  height is '5\' 6"'  (1.676 m) and weight is 161 lb (73 kg). Her  oral temperature is 98.6 F (37 C). Her blood pressure is 121/92 (abnormal) and her pulse is 88. Her respiration is 18 and oxygen saturation is 97%.   General: Alert and oriented, in no acute distress HEENT: Head is normocephalic. Extraocular movements are intact. Oropharynx is clear. Neck: Neck is supple, no palpable cervical or supraclavicular lymphadenopathy. Heart: Regular in rate and rhythm with no murmurs,  rubs, or gallops. Chest: Clear to auscultation bilaterally, with no rhonchi, wheezes, or rales. Abdomen: Soft, nontender, nondistended, with no rigidity or guarding. Extremities: No cyanosis or edema. Lymphatics: see Neck Exam Skin: No concerning lesions. Musculoskeletal: symmetric strength and muscle tone throughout. Neurologic: Cranial nerves II through XII are grossly intact. No obvious focalities. Speech is fluent. Coordination is intact. Psychiatric: Judgment and insight are intact. Affect is appropriate.  Pelvic exam deferred in light of recent surgery.     ECOG = 1  0 - Asymptomatic (Fully active, able to carry on all predisease activities without restriction)  1 - Symptomatic but completely ambulatory (Restricted in physically strenuous activity but ambulatory and able to carry out work of a light or sedentary nature. For example, light housework, office work)  2 - Symptomatic, <50% in bed during the day (Ambulatory and capable of all self care but unable to carry out any work activities. Up and about more than 50% of waking hours)  3 - Symptomatic, >50% in bed, but not bedbound (Capable of only limited self-care, confined to bed or chair 50% or more of waking hours)  4 - Bedbound (Completely disabled. Cannot carry on any self-care. Totally confined to bed or chair)  5 - Death   Eustace Pen MM, Creech RH, Tormey DC, et al. [phone]). "Toxicity and response criteria of the [facility]". [place] Oncol. 5 (6): 649-55  LABORATORY DATA:  Lab Results  Component Value Date   WBC 12.3 (H) 06/02/2018   HGB 12.7 06/02/2018   HCT 41.6 06/02/2018   MCV 83.4 06/02/2018   PLT 255 06/02/2018   NEUTROABS 7.0 03/14/2017   Lab Results  Component Value Date   NA 139 06/02/2018   K 3.8 06/02/2018   CL 110 06/02/2018   CO2 23 06/02/2018   GLUCOSE 103 (H) 06/02/2018   CREATININE 0.73 06/02/2018   CALCIUM 8.5 (L) 06/02/2018      RADIOGRAPHY: No results found.     IMPRESSION: Invasive moderately differentiated squamous cell carcinoma of the upper vaginal, pT1b  The patient was found to have lymphovascular space invasion as well as positive posterior surgical margin. Given these findings, I would agree with Dr. Serita Grit recommendations for postoperative treatment, including external beam radiation therapy, radiosensitizing chemotherapy, and intracavitary brachytherapy treatments.   Today, I talked to the patient and her mother about the findings and work-up thus far.  We discussed the natural history of vaginal cancer and general treatment, highlighting the role of radiotherapy in the management.  We discussed the available radiation techniques, and focused on the details of logistics and delivery.  We reviewed the anticipated acute and late sequelae associated with radiation in this setting.  The patient was encouraged to ask questions that I answered to the best of my ability. The patient would like to proceed with radiation and will be scheduled for CT simulation after imaging complete.   PLAN: The patient will proceed with post operative PET scan. If this is not approved by insurance, she will proceed with CT of abdomen and pelvis. Pt will be seen after scans  for planning of radiation treatment. Anticipate 5 weeks of external beam radiation therapy with radiosensitizing chemotherapy. The patient will then proceed with four intracavitary brachytherapy treatments in light of the positive surgical margin.    ------------------------------------------------  Blair Promise, PhD, MD      This document serves as a record of services personally performed by Gery Pray, MD. It was created on his behalf by Mary-Margaret Loma Messing, a trained medical scribe. The creation of this record is based on the scribe's personal observations and the provider's statements to them. This document has been checked and approved by the attending provider.

## 2018-06-16 ENCOUNTER — Other Ambulatory Visit: Payer: Self-pay | Admitting: Gynecologic Oncology

## 2018-06-16 ENCOUNTER — Telehealth: Payer: Self-pay | Admitting: *Deleted

## 2018-06-16 DIAGNOSIS — D4989 Neoplasm of unspecified behavior of other specified sites: Secondary | ICD-10-CM

## 2018-06-16 NOTE — Progress Notes (Signed)
Left message for the patient asking her to please call the office to discuss scans moving forward.  After peer to peer, insurance will not cover a PET scan without CT imaging prior.

## 2018-06-16 NOTE — Telephone Encounter (Signed)
Called and spoke with the patient.  Gave the appt date/time of 11/19 at 10:30 am, arrive at 10:15 am to the radiology department at [facility] Patient to drink first bottle of contrast at 8:30 am and the second bottle at 9:30 am. Patient to pick up bottle by Monday evening at 4pm.

## 2018-06-17 ENCOUNTER — Encounter: Payer: Self-pay | Admitting: General Practice

## 2018-06-17 NOTE — Progress Notes (Signed)
[place] Psychosocial Distress Screening Clinical Social Work  Clinical Social Work was referred by distress screening protocol.  The patient scored a 5 on the Psychosocial Distress Thermometer which indicates moderate distress. Clinical Social Worker contacted patient by phone to assess for distress and other psychosocial needs. Patient says she is doing well, but somewhat anxious due to uncertainties and multiple tests/appointments needed.  Does have concerns about navigating life during cancer treatment, will know more specifics after meeting w medical oncologist and hearing recommendations for treatment plan.  Has support from family.  Reviewed options available through [facility], encouraged to connect w resources as needed.    ONCBCN DISTRESS SCREENING 06/14/2018  Screening Type Initial Screening  Distress experienced in past week (1-10) 5  Practical problem type Housing;Insurance;Work/school;Childcare  Family Problem type Other (comment)  Emotional problem type Depression;Nervousness/Anxiety  Physical Problem type Pain;Sleep/insomnia    Clinical Social Worker follow up needed: No.  If yes, follow up plan:  Beverely Pace, [place], LCSW Clinical Social Worker Phone:  [phone]

## 2018-06-21 ENCOUNTER — Encounter: Payer: Self-pay | Admitting: Gynecologic Oncology

## 2018-06-21 ENCOUNTER — Inpatient Hospital Stay: Payer: 59 | Attending: Gynecologic Oncology | Admitting: Gynecologic Oncology

## 2018-06-21 VITALS — BP 121/74 | HR 87 | Temp 98.7°F | Resp 20 | Ht 66.0 in | Wt 157.4 lb

## 2018-06-21 DIAGNOSIS — C52 Malignant neoplasm of vagina: Secondary | ICD-10-CM

## 2018-06-21 DIAGNOSIS — Z7189 Other specified counseling: Secondary | ICD-10-CM

## 2018-06-21 DIAGNOSIS — Z9079 Acquired absence of other genital organ(s): Secondary | ICD-10-CM | POA: Diagnosis not present

## 2018-06-21 NOTE — Progress Notes (Signed)
Follow-up Note: Gyn-Onc   Kelly Mcclure 37 y.o. female  Chief Complaint  Patient presents with  . 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 Positive deep margin (rectovaginal septum).    Plan:  Given positive deep margins recommend adjuvant therapy with radiation and radiosensitizing chemotherapy. Will first obtain CT imaging to evaluate for gross residual disease or concerning nodes.   Ovaries pexed at the time of surgery to pelvic brim. Discussed that they may still receive scatter from radiation. If so, she will have premature menopause. If this develops, recommend estrogen replacement at her young age, until age 79. I discussed that this is not concerning for risk of recurrence/progression.   I will see Kelly Mcclure back after completing therapy.   HPI: Patient had an abnormal Pap smear in 2011. 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 Surgical margins were positive. Therefore, the patient underwent a cold knife conization on 04/01/2010. Pathology from that specimen showed severe dysplasia with negative margins. The patient was subsequently followed with Pap smears. A pap in March 2012 was ascus. ECC was negative. Repeat ECC in June 2012 was negative. Pap smear in 03/03/2012 showed high-grade SIL and again ECC was negative. Conservative followup was recommended.  Repeat colposcopy in December 2013 identified a lesion in the posterior vaginal fornix that was biopsied. Final pathology only showed atypia. A pap smear on that same day only showed LSIL.  A Pap smear on 10/01/2012 showed high-grade SIL.  Pap smear in March was low grade SIL. Patient subsequently underwent cervical dilatation, endocervical curettage, and vaginal biopsy (03/01/2013). The only abnormality found was atypia on vaginal biopsy.  October 2014 Pap smear with low-grade  SIL.  March 21, 2016 Pap smear showed HGSIL.  Endocervical curettage showed benign endocervical glands.  Pap smear July 15, 2016 showed HGSIL.  Endocervical curettage showed benign endocervical glands.  January 13, 2018 Pap smear showed HGSIL.  Exam by Dr Fermin Schwab on July 9th showed a friable exophytic lesion on the cervix which was biopsied. Final pathology showed "at least CIN III, invasion could not be ruled out". Colpo had been difficult as the cervix was flush with the vagina and the upper vagina was narrowed.  The patient was interested in a definitive hysterectomy procedure however she required an excisional procedure to rule out invasive carcinoma before having this.  On 02/18/18 she underwent cold knife conization of the cervix in the operating room.  Inspection of the cervix with acetic acid application was grossly unremarkable with no apparent lesions seen.  The upper vagina was also grossly normal.  The cervix was small, fairly flush with the surrounding vagina, however the conization procedure was relatively straightforward.  A 0.9 cm AP dimension by 1.5 cm deep dimension cone was taken without difficulty.  There is minimal bleeding in the operating room.  The patient began experiencing high fevers to 101 and 102 on postoperative day 4 and was prescribed empiric antibiotics. Exam revealed no concerning signs.  Final pathology revealed no residual dysplasia.  On postoperative examination a friable lesion was seen on the posterior upper vaginal wall measuring approximately 2cm. It was separate to, but abutting, the posterior lip of the cervix. 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   Interval History:  The patient was recommended surgical excision of the lesion, and due to  its proximity with the cervix, was recommended hysterectomy (without oophorectomy) at the time of surgery in order to establish adequate  margins.  The patient had her surgery scheduled (robotic hysterectomy, upper vaginectomy), however canceled this after she discussed the proposed surgery with her referring doctor, Dr Ronita Hipps, who had concerns about the surgical plan. Dr Ronita Hipps requested that Kelly Mcclure see my partner, Dr Fermin Schwab for a second opinion.   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  Given his shared concern that this lesion may harbor occult invasive disease, and due to its proximity to the cervix, he agreed with the surgical plan of hysterectomy (not oophorectomy) with salpingectomy and upper vaginectomy.   On June 01, 2018 the patient underwent a robotic assisted total hysterectomy with upper vaginectomy bilateral salpingectomy and due for pexy.  The surgical procedure was unremarkable and uncomplicated.  Operative findings were significant for a grossly normal uterus and cervix and tubes and ovaries.  A 3 x 3 cm discoid raised erythematous lesion was visualized vaginally on the posterior upper wall of the vagina with close proximity to the cervix (approximately 7 mm margin to the cervicovaginal junction.  At the completion of the procedure there was no visible remaining lesion in the vagina.  Via intraperitoneal inspection there was no eruption of the tumor through into the intraperitoneal space.  The rectovaginal septum was created with a ease and there was no gross involvement of the anterior rectal wall during surgery.  Inspection of the specimen revealed the margins to be grossly clear of disease.  At the completion of the procedure the ovaries were pexed to the pelvic brim.  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  The carcinoma involved the cauterized deep resection  margin representing the rectovaginal septal margin.  Lymphovascular space invasion was present.  The cervix and uterus and fallopian tubes are grossly unremarkable.  Review of Systems:10 point review of systems is negative except as noted in interval history.   Vitals: Blood pressure 121/74, pulse 87, temperature 98.7 F (37.1 C), temperature source Oral, resp. rate 20, height '5\' 6"'  (1.676 m), weight 157 lb 6.4 oz (71.4 kg), SpO2 97 %, not currently breastfeeding.  Physical Exam: General : The patient is a healthy woman in no acute distress.  HEENT: normocephalic, extraoccular movements normal; neck is supple without thyromegally  Lynphnodes: Supraclavicular and inguinal nodes not enlarged  Abdomen: Soft, non-tender, no ascites, no organomegally, no masses, no hernias. Well healed incisions.  Pelvic:  Vaginal cuff healing normally. Preserved vaginal length (>10cm). No lesions seen. No masses palpable.   Lower extremities: No edema or varicosities. Normal range of motion   Allergies  Allergen Reactions  . Sulfa Antibiotics Swelling    Tongue swelling, throat irritated  . Adhesive [Tape] Other (See Comments)    "skin burn"  . Other Swelling and Other (See Comments)    Magic mouth wash/ causes swelling of the tongue  . Relpax [Eletriptan Hydrobromide] Other (See Comments)    Stroke-like symptoms  . Ultram [Tramadol] Other (See Comments)    2004--  Per pt had seizure due to interaction with other medication she was taking at the time    Past Medical History:  Diagnosis Date  . Cancer [facility]) DX 2011   CERVICAL   . Cough    WITH  SEASONAL ALLEGIES, NONPRODUCTIVE LAST 2 DAYS  . H/O radioactive iodine thyroid ablation 12/2009  . 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 History of cervical dysplasia   . History of hyperthyroidism    per pt dx 2000 -- s/p RAI  2011  . History of kidney stones 2011  . Hx of varicella   . Hypothyroidism, postradioiodine therapy     endocrinology-  dr Cruzita Lederer  . Migraines   . Mild persistent asthma 01/19/2018   followed by dr Verlin Fester ([facility]) MILD (SUBCLINICAL PER PT)  . 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 Vaginal Pap smear, abnormal     Past Surgical History:  Procedure Laterality Date  . ABDOMINAL CERCLAGE N/A 11/19/2016   Procedure: LAPAROSCOPIC TRANSABDOMINAL CERVICAL-ISTHMUSx2;  Surgeon: Governor Specking, MD;  Location: [place] [facility];  Service: Gynecology;  Laterality: N/A;  with ultrasound guidance. Start laparoscopically HOLD OPEN ABDOMINAL ITEMS   . CERCLAGE LAPAROSCOPIC ABDOMINAL N/A 11/19/2016   Procedure: CERCLAGE LAPAROSCOPIC ABDOMINAL;  Surgeon: Governor Specking, MD;  Location: [place] [facility];  Service: Gynecology;  Laterality: N/A;  . CERVICAL CONIZATION W/BX N/A 02/18/2018   Procedure: COLD KNIFE CONIZATION OF CERVIX WITH POSSIBLE BIOPSY;  Surgeon: [name], MD;  Location: [place];  Service: Gynecology;  Laterality: N/A;  . CESAREAN SECTION N/A 05/27/2017   Procedure: Primary CESAREAN SECTION;  Surgeon: Brien Few, MD;  Location: [place];  Service: Obstetrics;  Laterality: N/A;  EDD: 11/4/18Allergy: Adhesive, Relpax, Sulfa, Ultram  . COLD KNIFE CONIZATION  03-29-2010   dr Ronita Hipps  [facility]  . DILATION AND CURETTAGE OF UTERUS  2009  . DILATION AND CURETTAGE OF UTERUS N/A 03/01/2013   Procedure: CERVICAL DILATATION AND ENDOCERVICAL CURRETAGE AND VAAGINAL BIOPIES;  Surgeon: Alvino Chapel, MD;  Location: [facility];  Service: Gynecology;  Laterality: N/A;  . LEEP  02/25/2010;  12/ 2012  dr Ronita Hipps office  . NASAL SEPTUM SURGERY  2006   Repair of deviated septum  . SIGMOIDOSCOPY  2009  . TONSILLECTOMY AND ADENOIDECTOMY  1995    Current Outpatient Medications  Medication Sig Dispense Refill  . albuterol (PROVENTIL  HFA;VENTOLIN HFA) 108 (90 Base) MCG/ACT inhaler Inhale 2 puffs into the lungs every 6 (six) hours as needed for wheezing or shortness of breath. 1 Inhaler 1  . ALPRAZolam (XANAX) 0.5 MG tablet Take 1 tablet (0.5 mg total) by mouth at bedtime as needed for anxiety. 30 tablet 3  . butalbital-acetaminophen-caffeine (FIORICET, ESGIC) 50-325-40 MG tablet Take 1-2 tablets by mouth every 4 (four) hours as needed for migraine.     Marland Kitchen ibuprofen (ADVIL,MOTRIN) 800 MG tablet Take 1 tablet (800 mg total) by mouth every 8 (eight) hours as needed. 30 tablet 0  . LamoTRIgine (LAMICTAL XR) 200 MG TB24 24 hour tablet Take 2 tablets (400 mg total) by mouth 2 (two) times daily. (Patient taking differently: Take 400 mg by mouth 2 (two) times daily. Takes 1000 and 2200) 360 tablet 4  . LamoTRIgine 100 MG TB24 24 hour tablet Take 1 tablet (100 mg total) by mouth at bedtime. 30 tablet 11  . levETIRAcetam (KEPPRA) 750 MG tablet Take 2 tablets (1,500 mg total) by mouth 2 (two) times daily. 360 tablet 4  . loratadine (CLARITIN) 10 MG tablet Take 10 mg by mouth every morning.     Marland Kitchen  montelukast (SINGULAIR) 10 MG tablet TAKE 1 TABLET BY MOUTH EVERYDAY AT BEDTIME 30 tablet 0  . Multiple Vitamins-Minerals (CENTRUM WOMEN) TABS Take 1 tablet by mouth daily.    Marland Kitchen SYNTHROID 150 MCG tablet Take 1 tablet (150 mcg total) by mouth daily before breakfast. 90 tablet 3   No current facility-administered medications for this visit.     Social History   Socioeconomic History  . Marital status: Single    Spouse name: Not on file  . Number of children: Not on file  . Years of education: Not on file  . Highest education level: Not on file  Occupational History  . Not on file  Social Needs  . Financial resource strain: Not on file  . Food insecurity:    Worry: Not on file    Inability: Not on file  . Transportation needs:    Medical: Not on file    Non-medical: Not on file  Tobacco Use  . Smoking status: Never Smoker  . Smokeless  tobacco: Never Used  Substance and Sexual Activity  . Alcohol use: Not Currently    Comment: SELDOM, NONE IN  10 MONTHS  . Drug use: No  . Sexual activity: Yes    Birth control/protection: None  Lifestyle  . Physical activity:    Days per week: Not on file    Minutes per session: Not on file  . Stress: Not on file  Relationships  . Social connections:    Talks on phone: Not on file    Gets together: Not on file    Attends religious service: Not on file    Active member of club or organization: Not on file    Attends meetings of clubs or organizations: Not on file    Relationship status: Not on file  . Intimate partner violence:    Fear of current or ex partner: Not on file    Emotionally abused: Not on file    Physically abused: Not on file    Forced sexual activity: Not on file  Other Topics Concern  . Not on file  Social History Narrative  . Not on file    Family History  Problem Relation Age of Onset  . Leukemia Other   . Diabetes Other   . Diabetes Mother   . Skin cancer Mother   . Hyperlipidemia Mother   . Hypertension Father   . Asthma Father   . Skin cancer Maternal Aunt   . Multiple myeloma Maternal Aunt   . Testicular cancer Maternal Uncle   . Hyperlipidemia Maternal Grandmother   . AAA (abdominal aortic aneurysm) Maternal Grandmother   . COPD Maternal Grandmother   . Prostate cancer Maternal Grandfather   . AAA (abdominal aortic aneurysm) Maternal Grandfather     30 minutes of direct face to face counseling time was spent with the patient. This included discussion about prognosis, therapy recommendations and postoperative side effects and are beyond the scope of routine postoperative care.  Thereasa Solo, MD 06/21/2018, 3:00 PM

## 2018-06-21 NOTE — Patient Instructions (Signed)
Dr Denman George will see you back after completing therapy with Dr's Kinard and Alvy Bimler.  If you develop menopausal symptoms in the interim, then please let us know at [place] and Dr Serita Grit office will prescribe estrogen replacement.

## 2018-06-22 ENCOUNTER — Encounter (HOSPITAL_COMMUNITY): Payer: Self-pay | Admitting: Radiology

## 2018-06-22 ENCOUNTER — Ambulatory Visit (HOSPITAL_COMMUNITY)
Admission: RE | Admit: 2018-06-22 | Discharge: 2018-06-22 | Disposition: A | Discharge status: Home / Self Care | Payer: 59 | Source: Ambulatory Visit | Attending: Gynecologic Oncology | Admitting: Gynecologic Oncology

## 2018-06-22 DIAGNOSIS — Z90710 Acquired absence of both cervix and uterus: Secondary | ICD-10-CM | POA: Diagnosis not present

## 2018-06-22 DIAGNOSIS — D4989 Neoplasm of unspecified behavior of other specified sites: Secondary | ICD-10-CM | POA: Diagnosis not present

## 2018-06-22 DIAGNOSIS — K769 Liver disease, unspecified: Secondary | ICD-10-CM | POA: Diagnosis not present

## 2018-06-22 DIAGNOSIS — R918 Other nonspecific abnormal finding of lung field: Secondary | ICD-10-CM | POA: Insufficient documentation

## 2018-06-22 DIAGNOSIS — N200 Calculus of kidney: Secondary | ICD-10-CM | POA: Diagnosis not present

## 2018-06-22 MED ORDER — SODIUM CHLORIDE (PF) 0.9 % IJ SOLN
INTRAMUSCULAR | Status: AC
  Filled 2018-06-22: qty 50

## 2018-06-22 MED ORDER — IOHEXOL 300 MG/ML  SOLN
100.0000 mL | Freq: Once | INTRAMUSCULAR | Status: AC | PRN
  Administered 2018-06-22: 100 mL via INTRAVENOUS

## 2018-06-23 ENCOUNTER — Other Ambulatory Visit: Payer: Self-pay | Admitting: Gynecologic Oncology

## 2018-06-23 ENCOUNTER — Telehealth: Payer: Self-pay | Admitting: *Deleted

## 2018-06-23 DIAGNOSIS — D4989 Neoplasm of unspecified behavior of other specified sites: Secondary | ICD-10-CM

## 2018-06-23 DIAGNOSIS — C52 Malignant neoplasm of vagina: Secondary | ICD-10-CM

## 2018-06-23 NOTE — Telephone Encounter (Signed)
Scheduled PET scan and called the patient. Gave the patient information for the date/time and instructions

## 2018-06-28 ENCOUNTER — Encounter: Payer: Self-pay | Admitting: Oncology

## 2018-06-28 NOTE — Progress Notes (Signed)
Gynecologic Oncology Multi-Disciplinary Disposition Conference Note  Date of the Conference: 06/28/2018   Patient Name: Kelly Mcclure  Referring Provider: Primary GYN Oncologist:  Stage/Disposition:  Stace IC vaginal cancer. Disposition is to chemotherapy and radiation followed by brachytherapy.   This Multidisciplinary conference took place involving physicians from [place], [place], Radiation Oncology, Pathology, Radiology along with the Gynecologic Oncology Nurse Practitioner and RN.  Comprehensive assessment of the patient's malignancy, staging, need for surgery, chemotherapy, radiation therapy, and need for further testing were reviewed. Supportive measures, both inpatient and following discharge were also discussed. The recommended plan of care is documented. Greater than 35 minutes were spent correlating and coordinating this patient's care.

## 2018-06-30 ENCOUNTER — Encounter: Payer: Self-pay | Admitting: Gynecologic Oncology

## 2018-07-02 ENCOUNTER — Ambulatory Visit (HOSPITAL_COMMUNITY)
Admission: RE | Admit: 2018-07-02 | Discharge: 2018-07-02 | Disposition: A | Discharge status: Home / Self Care | Payer: 59 | Source: Ambulatory Visit | Attending: Gynecologic Oncology | Admitting: Gynecologic Oncology

## 2018-07-02 DIAGNOSIS — C52 Malignant neoplasm of vagina: Secondary | ICD-10-CM | POA: Insufficient documentation

## 2018-07-02 DIAGNOSIS — D4989 Neoplasm of unspecified behavior of other specified sites: Secondary | ICD-10-CM | POA: Diagnosis present

## 2018-07-02 LAB — GLUCOSE, CAPILLARY: Glucose-Capillary: 98 mg/dL (ref 70–99)

## 2018-07-02 MED ORDER — FLUDEOXYGLUCOSE F - 18 (FDG) INJECTION
7.8300 | Freq: Once | INTRAVENOUS | Status: AC
  Administered 2018-07-02: 7.83 via INTRAVENOUS

## 2018-07-05 ENCOUNTER — Ambulatory Visit
Admission: RE | Admit: 2018-07-05 | Discharge: 2018-07-05 | Disposition: A | Discharge status: Home / Self Care | Payer: 59 | Source: Ambulatory Visit | Attending: Radiation Oncology | Admitting: Radiation Oncology

## 2018-07-05 VITALS — BP 134/84 | HR 93 | Temp 97.8°F | Resp 18 | Ht 67.5 in | Wt 148.4 lb

## 2018-07-05 DIAGNOSIS — C52 Malignant neoplasm of vagina: Secondary | ICD-10-CM | POA: Insufficient documentation

## 2018-07-05 DIAGNOSIS — Z79899 Other long term (current) drug therapy: Secondary | ICD-10-CM | POA: Diagnosis not present

## 2018-07-05 NOTE — Progress Notes (Signed)
  Radiation Oncology         [phone] ________________________________  Name: Kelly Mcclure MRN: [id number]  Date: 07/05/2018  DOB: 01/21/1981  SIMULATION AND TREATMENT PLANNING NOTE    ICD-10-CM   1. Vaginal cancer ([place]) C52     DIAGNOSIS:  Vaginal cancer Stage pT1b, cN1, M0 invasive moderately differentiated squamous cell carcinoma  NARRATIVE:  The patient was brought to the [facility].  Identity was confirmed.  All relevant records and images related to the planned course of therapy were reviewed.  The patient freely provided informed written consent to proceed with treatment after reviewing the details related to the planned course of therapy. The consent form was witnessed and verified by the simulation staff.  Then, the patient was set-up in a stable reproducible  supine position for radiation therapy.  CT images were obtained.  Surface markings were placed.  The CT images were loaded into the planning software.  Then the target and avoidance structures were contoured.  Treatment planning then occurred.  The radiation prescription was entered and confirmed.  Then, I designed and supervised the construction of a total of 2 medically necessary complex treatment devices.  I have requested : Intensity Modulated Radiotherapy (IMRT) is medically necessary for this case for the following reason:  Small bowel sparing and ovary sparing (transposed).  I have ordered:dose calc.  PLAN:  The patient will receive 45 Gy in 25 fractions directed at the pelvis area. The patient will then proceed with a boost to the PET positive iliac nodes in the right pelvis of 16 gray for a cumulative dose to these nodes of 61 gray. The initial 5 weeks of external beam radiation therapy will be with radiosensitizing chemotherapy. She will then proceed with  Brachytherapy boost to the proximal vagina light of the positive surgical margin. Anticipated number brachytherapy treatments is 4.  Special  Treatment Procedure Note: The patient will be receiving radiosensitizing chemotherapy. Given the potential of increased toxicities related to combined therapy and the necessity for close monitoring of the patient and blood work, this constitutes a special treatment procedure. The patient will also be receiving brachytherapy which also constitutes a special treatment procedure.  -----------------------------------  Blair Promise, PhD, MD  This document serves as a record of services personally performed by Gery Pray, MD. It was created on his behalf by Mary-Margaret Loma Messing, a trained medical scribe. The creation of this record is based on the scribe's personal observations and the provider's statements to them. This document has been checked and approved by the attending provider.

## 2018-07-06 ENCOUNTER — Encounter: Payer: Self-pay | Admitting: Gynecologic Oncology

## 2018-07-07 ENCOUNTER — Ambulatory Visit: Payer: 59 | Admitting: Nurse Practitioner

## 2018-07-07 ENCOUNTER — Telehealth: Payer: Self-pay | Admitting: Gynecologic Oncology

## 2018-07-07 NOTE — Telephone Encounter (Signed)
Informed patient of PET scan results which showed increased uptake in the neck lymph nodes and oropharynx consistent with her active upper respiratory infection.  Discussed that there was a right internal iliac lymph node that is highly suspicious for metastatic disease.  There is increased uptake in the right ovary however I suspect this is physiologic and secondary to ovulation  I am continuing to recommend whole pelvic radiation with concurrent cisplatin chemotherapy, and vaginal boost.  I will discuss with Dr. Randa Ngo had Henreitta Leber should be given to this right pelvic lymph node region in order to achieve better coverage for these concerning nodal regions.  I discussed that we will recommend PET scan to be repeated 3 months after completion of therapy.  I discussed that I do not believe the FDG uptake in her neck and oropharynx malignancy, but rather related to her infectious process.  Kelly Solo, MD

## 2018-07-08 ENCOUNTER — Ambulatory Visit (HOSPITAL_COMMUNITY): Payer: 59

## 2018-07-08 ENCOUNTER — Ambulatory Visit (INDEPENDENT_AMBULATORY_CARE_PROVIDER_SITE_OTHER): Payer: 59 | Admitting: Neurology

## 2018-07-08 ENCOUNTER — Telehealth: Payer: Self-pay | Admitting: Hematology and Oncology

## 2018-07-08 ENCOUNTER — Inpatient Hospital Stay (HOSPITAL_BASED_OUTPATIENT_CLINIC_OR_DEPARTMENT_OTHER): Payer: 59 | Admitting: Hematology and Oncology

## 2018-07-08 ENCOUNTER — Encounter: Payer: Self-pay | Admitting: Hematology and Oncology

## 2018-07-08 ENCOUNTER — Encounter: Payer: Self-pay | Admitting: Radiation Oncology

## 2018-07-08 ENCOUNTER — Encounter: Payer: Self-pay | Admitting: Neurology

## 2018-07-08 ENCOUNTER — Other Ambulatory Visit: Payer: Self-pay | Admitting: Hematology and Oncology

## 2018-07-08 VITALS — BP 135/92 | HR 114 | Ht 67.5 in | Wt 158.0 lb

## 2018-07-08 VITALS — BP 116/68 | HR 95 | Temp 98.0°F | Resp 18 | Ht 67.5 in | Wt 160.2 lb

## 2018-07-08 DIAGNOSIS — C52 Malignant neoplasm of vagina: Secondary | ICD-10-CM | POA: Diagnosis not present

## 2018-07-08 DIAGNOSIS — R569 Unspecified convulsions: Secondary | ICD-10-CM | POA: Diagnosis not present

## 2018-07-08 DIAGNOSIS — R591 Generalized enlarged lymph nodes: Secondary | ICD-10-CM | POA: Diagnosis not present

## 2018-07-08 DIAGNOSIS — G40909 Epilepsy, unspecified, not intractable, without status epilepticus: Secondary | ICD-10-CM | POA: Diagnosis not present

## 2018-07-08 MED ORDER — LIDOCAINE-PRILOCAINE 2.5-2.5 % EX CREA: TOPICAL_CREAM | CUTANEOUS | 3 refills | Status: DC

## 2018-07-08 MED ORDER — ONDANSETRON HCL 8 MG PO TABS: 8.0000 mg | ORAL_TABLET | Freq: Two times a day (BID) | ORAL | 1 refills | Status: DC | PRN

## 2018-07-08 MED ORDER — PROCHLORPERAZINE MALEATE 10 MG PO TABS: 10.0000 mg | ORAL_TABLET | Freq: Four times a day (QID) | ORAL | 1 refills | Status: DC | PRN

## 2018-07-08 MED FILL — PROCHLORPERAZINE 10 MG TAB: 10 | 7 days supply | Qty: 30 | Fill #0

## 2018-07-08 MED FILL — LIDOCAINE-PRILOCAINE CREAM: 2.5-2.5 | 30 days supply | Qty: 30 | Fill #0

## 2018-07-08 MED FILL — ONDANSETRON HCL 8 MG TABLET: 8 | 15 days supply | Qty: 30 | Fill #0

## 2018-07-08 NOTE — Assessment & Plan Note (Signed)
I have reviewed multiple imaging studies. The abnormal appearance on her neck is likely related to recent infection/bronchitis We will proceed with treatment without delay Her recent symptoms of infection has resolved and she has completed antibiotic therapy  We discussed the role of chemotherapy. The intent is of curative intent.  We discussed some of the risks, benefits, side-effects of cisplatin and its role as chemo sensitizing agent. The plan for weekly cisplatin for x 5-6 doses along with radiation treatment.  Some of the short term side-effects included, though not limited to, including weight loss, life threatening infections, risk of allergic reactions, need for transfusions of blood products, nausea, vomiting, change in bowel habits, loss of hair, admission to hospital for various reasons, and risks of death.   Long term side-effects are also discussed including risks of infertility, permanent damage to nerve function, hearing loss, chronic fatigue, kidney damage with possibility needing hemodialysis, and rare secondary malignancy including bone marrow disorders.  The patient is aware that the response rates discussed earlier is not guaranteed.  After a long discussion, patient made an informed decision to proceed with the prescribed plan of care.   Patient education material was dispensed. I will schedule chemo education class, port placement and weekly chemotherapy to start next week.  I will see her on a weekly basis for supportive care

## 2018-07-08 NOTE — Progress Notes (Signed)
START OFF PATHWAY REGIMEN - Other Dx   [id number]:Cisplatin 40 mg/m2 IV D1 q7 Days + RT:   A cycle is every 7 days:     Cisplatin   **Always confirm dose/schedule in your pharmacy ordering system**  Patient Characteristics: Intent of Therapy: Curative Intent, Discussed with Patient 

## 2018-07-08 NOTE — Assessment & Plan Note (Signed)
This is likely related to recent infection. Her symptoms had resolved. I plan to repeat imaging study after completion of therapy

## 2018-07-08 NOTE — Progress Notes (Signed)
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 The abnormal appearance on her neck is likely related to recent infection/bronchitis We will proceed with treatment without delay Her recent symptoms of infection has resolved and she has completed antibiotic therapy  We discussed the role of chemotherapy. The intent is of curative intent.  We discussed some of the risks, benefits, side-effects of cisplatin and its role as chemo sensitizing agent. The plan for weekly cisplatin for x 5-6 doses along with radiation treatment.  Some of the short term side-effects included, though not limited to, including weight loss, life threatening infections, risk of allergic reactions, need for transfusions of blood products, nausea, vomiting, change in bowel habits, loss of hair, admission to hospital for various reasons, and risks of death.   Long term side-effects are also discussed including risks of infertility, permanent damage to nerve function, hearing loss, chronic fatigue, kidney damage with possibility needing hemodialysis, and rare secondary malignancy including bone marrow disorders.  The patient is aware that the response rates discussed earlier is not guaranteed.  After a long discussion, patient made an informed decision to proceed with the prescribed plan of care.   Patient education material was dispensed. I will schedule chemo education class, port placement and weekly chemotherapy to start next week.  I will see her on a weekly basis for supportive care     Epilepsy [facility]) She has no recurrence of seizures since last year.  She will continue antiseizure management per neurologist.  Lymphadenopathy of head and neck This is likely related to recent infection. Her symptoms had resolved. I plan to repeat imaging study after  completion of therapy   Orders Placed This Encounter  Procedures  . IR IMAGING GUIDED PORT INSERTION    Standing Status:   Future    Standing Expiration Date:   09/09/2019    Order Specific Question:   Reason for Exam (SYMPTOM  OR DIAGNOSIS REQUIRED)    Answer:   need port for chemo    Order Specific Question:   Is the patient pregnant?    Answer:   No    Order Specific Question:   Preferred Imaging Location?    Answer:   [facility]  . Comprehensive metabolic panel    Standing Status:   Standing    Number of Occurrences:   22    Standing Expiration Date:   07/09/2019  . CBC with Differential/Platelet    Standing Status:   Standing    Number of Occurrences:   22    Standing Expiration Date:   07/09/2019  . Magnesium    Standing Status:   Standing    Number of Occurrences:   22    Standing Expiration Date:   07/09/2019     CHIEF COMPLAINTS/PURPOSE OF CONSULTATION:  Vaginal cancer, for adjuvant treatment  HISTORY OF PRESENTING ILLNESS:  Kelly Mcclure 37 y.o. female is here because of recent diagnosis of vaginal cancer.  Her mother is present. I have reviewed her chart and materials related to her cancer extensively and collaborated history with the patient. Summary of oncologic history is as follows:   Vaginal cancer ([place])   03/01/2013 Surgery    OPERATIVE REPORT  PREOPERATIVE DIAGNOSIS: High-grade dysplasia on Pap smear  POSTOPERATIVE DIAGNOSIS: Same  PROCEDURE: Exam under anesthesia, endocervical curettage, vaginal biopsies  SURGEON: Quillian Quince  Clarke-Pearson, M.D SURGICAL FINDINGS: Examination under anesthesia revealed a cervix which was flush with the vaginal fornices. The cervical canal was stenotic but once entered and dilated the uterus sounded approximate 7 cm anteriorly. Apply Lugol solution to the cervix showed no lesions. 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 Endotracheal  Surgeon:Tamer Kerin Perna, MD Findings: On exam under anesthesia the cervix was closed and deviated to the left. On laparoscopy the liver, gallbladder, and appendix were normal.  The uterus was gravid and retroverted and retroflexed, both tubes and ovaries appeared normal.  Intraoperative ultrasound transabdominally and transvaginally showed a singleton intrauterine pregnancy at length consistent with 11 weeks. There was fetal cardiac activity and movements.     01/19/2018 Pathology Results    Cervical biopsy showed high grade squamous intraepithelial with severe dysplasia    02/09/2018 Pathology Results    Cervix, biopsy - SUPERFICIAL FRAGMENTS OF CERVICAL MUCOSA SHOWING AT LEAST HIGH GRADE SQUAMOUS INTRAEPITHELIAL LESION (HSIL, CIN-III). - DEFINITIVE INVASION IS NOT SEEN BUT CANNOT BE ENTIRELY RULED OUT.    02/18/2018 Pathology Results    1. Cervix, cone - BENIGN SQUAMOUS AND ENDOCERVICAL MUCOSA, SEE COMMENT. - NO DYSPLASIA OR MALIGNANCY. 2. Endocervix, curettage - BLOOD. - NO MUCOSA PRESENT. Microscopic Comment 1. p16 is negative in the squamous epithelium    02/18/2018 Surgery    Preop Diagnosis: CIN III  Postoperative Diagnosis: same  Surgery: cold knife conization of cervix   Surgeons:  Donaciano Eva, MD Pathology: cervical cone with marking stitch at 12 o'clock, post cone ECC  Operative findings: no grossly visible exophytic lesions on ectocervix. Cervix flush with vagina.     03/11/2018 Pathology Results    Vagina, biopsy, posterior upper vaginal wall - HIGH GRADE  SQUAMOUS DYSPLASIA, SEE COMMENT. Microscopic Comment There are detached squamous fragments with high grade dysplastic features. Several of the fragments have a minimal amount of attached stroma which is inflamed and focally fibrotic. While an invasive component cannot be ruled out, the limited stroma present hampers evaluate for invasion    04/27/2018 Pathology Results    Vagina, biopsy, posterior - SUPERFICIAL FRAGMENTS OF EXTENSIVE SQUAMOUS CELL CARCINOMA IN SITU. - SEE NOTE. Diagnosis Note Though focally suspicious, definitive evidence of invasive carcinoma is not seen in the submitted biopsies. The submitted fragments are superficial in nature and a deeper, more severe process cannot be ruled out. Dr Tresa Moore has reviewed this case and concurs with the above interpretation.    06/01/2018 Pathology Results    Uterus +/- tubes/ovaries, neoplastic, upper 1/3 of vagina - INVASIVE MODERATELY DIFFERENTIATED SQUAMOUS CELL CARCINOMA, 3.0 CM, INVOLVING POSTERIOR WALL OF UPPER THIRD OF VAGINA. - CARCINOMA INVOLVES THE CAUTERIZED DEEP RESECTION MARGIN; MUCOSAL MARGINS ARE NOT INVOLVED. - LYMPHOVASCULAR INVASION IS PRESENT. - UTERUS WITH BENIGN PROLIFERATIVE ENDOMETRIUM. - BENIGN UNREMARKABLE CERVIX. - BENIGN UNREMARKABLE BILATERAL FALLOPIAN TUBE. - SEE ONCOLOGY TABLE. - SEE NOTE. Microscopic Comment VAGINA: Resection Procedure: Partial vaginectomy with total hysterectomy and bilateral salpingectomy. Tumor Site: Posterior wall of upper third of vagina. Tumor Size: 3.0 cm. Histologic Type: Squamous cell carcinoma. Histologic Grade: G2: Moderately differentiated. Other Tissue/ Organ: Uterus and bilateral fallopian  tubes are not involved by carcinoma. Margins: Deep cauterized margin is positive for carcinoma. Lymphovascular Invasion: Present. Regional Lymph Nodes: No lymph nodes submitted or found. Number of Lymph Nodes Examined: 0. Pathologic Stage Classification (pTNM, AJCC 8th Edition): pT1b,  pNX. Representative Tumor Block: 1C.  Lesion is HPV positive    06/01/2018 Surgery    Pre-operative Diagnosis: history of cervical cancer, VAIN III of upper posterior vagina  Post-operative Diagnosis: same  Operation: Robotic-assisted laparoscopic total hysterectomy with bilateral salpingectomy with upper vaginectomy and oophoropexy  Surgeon: Donaciano Eva  Operative Findings:  : 6cm uterus with normal tubes and ovaries (benign appearing functional cyst on left ovary measuring 2cm). Cerclage (merseline) in place with knot anteriorally. Bladder flap adhesions. 3x3cm discoid raised erythematous lesion on posterior upper wall of vagina with close proximity to cervix. No visible lesion remaining in vagina at completion of procedure.     06/22/2018 Imaging    Ct imaging 1. Status post total abdominal hysterectomy. No definitive findings to strongly suggest metastatic disease to the chest, abdomen or pelvis. 2. 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 Further evaluation with nonemergent MRI of the abdomen with and without IV gadolinium is strongly recommended to provide definitive characterization of this lesion in the near future. 3. Two small pulmonary nodules measuring 5 mm or less in size, nonspecific but statistically likely benign. Attention on routine follow-up studies is recommended to ensure their stability. 4. 4 mm nonobstructive calculus in the upper pole collecting system of the right kidney. No ureteral stones or findings of urinary tract obstruction are noted at this time    07/02/2018 PET scan    1. Hypermetabolic right internal iliac lymph nodes, consistent with metastatic disease. 2. Hypermetabolic cervical lymph nodes bilaterally. This would be atypical metastatic spread for the reported history of vaginal cancer. The patient does have diffuse, but symmetric hypermetabolic uptake in the nasopharynx and  hypopharynx. Infectious/inflammatory etiology a consideration although neoplasm not excluded. 3. Hypermetabolic FDG accumulation in the right ovary, nonspecific with no gross mass lesion evident. Left ovary is high in the left pelvis with low level FDG uptake. 4. Tiny bilateral pulmonary nodules as seen on previous diagnostic CT. These are below threshold size for reliable resolution on PET imaging.     07/08/2018 Cancer Staging    Staging form: Vagina, AJCC 7th Edition - Pathologic: Stage III (T1, N1, cM0) - Signed by Heath Lark, MD on 07/08/2018    She is recovering well from surgery. She had recent bronchitis, treated successfully with antibiotic treatment.  She denies recurrence of seizures since last year  MEDICAL HISTORY:  Past Medical History:  Diagnosis Date  . Cancer [facility]) DX 2011   CERVICAL   . Cough    WITH SEASONAL ALLEGIES, NONPRODUCTIVE LAST 2 DAYS  . H/O radioactive iodine thyroid ablation 12/2009  . 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 History of cervical dysplasia   . History of hyperthyroidism    per pt dx 2000 -- s/p RAI  2011  . History of kidney stones 2011  . Hx of varicella   . Hypothyroidism, postradioiodine therapy    endocrinology-  dr Cruzita Lederer  . Migraines   . Mild persistent asthma 01/19/2018   followed by dr Verlin Fester (all[facility] and asthma [facility]) MILD (SUBCLINICAL PER PT)  . Seizure disorder [facility]) neurologist-  dr Krista Blue   Dx age 59-- per pt subclinical epilepsy-- controlled w/ meds,  last  seizure 08/ 2018  . 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 Vaginal Pap smear, abnormal     SURGICAL HISTORY: Past Surgical History:  Procedure Laterality Date  . ABDOMINAL CERCLAGE N/A 11/19/2016   Procedure: LAPAROSCOPIC TRANSABDOMINAL CERVICAL-ISTHMUSx2;  Surgeon: Governor Specking, MD;  Location: [facility];  Service: Gynecology;  Laterality: N/A;  with ultrasound guidance. Start laparoscopically HOLD OPEN ABDOMINAL ITEMS   .  CERCLAGE LAPAROSCOPIC ABDOMINAL N/A 11/19/2016   Procedure: CERCLAGE LAPAROSCOPIC ABDOMINAL;  Surgeon: Governor Specking, MD;  Location: [facility];  Service: Gynecology;  Laterality: N/A;  . CERVICAL CONIZATION W/BX N/A 02/18/2018   Procedure: COLD KNIFE CONIZATION OF CERVIX WITH POSSIBLE BIOPSY;  Surgeon: [name], MD;  Location: [place];  Service: Gynecology;  Laterality: N/A;  . CESAREAN SECTION N/A 05/27/2017   Procedure: Primary CESAREAN SECTION;  Surgeon: Brien Few, MD;  Location: [place];  Service: Obstetrics;  Laterality: N/A;  EDD: 11/4/18Allergy: Adhesive, Relpax, Sulfa, Ultram  . COLD KNIFE CONIZATION  03-29-2010   dr Ronita Hipps  [facility]  . DILATION AND CURETTAGE OF UTERUS  2009  . DILATION AND CURETTAGE OF UTERUS N/A 03/01/2013   Procedure: CERVICAL DILATATION AND ENDOCERVICAL CURRETAGE AND VAAGINAL BIOPIES;  Surgeon: Alvino Chapel, MD;  Location: [facility];  Service: Gynecology;  Laterality: N/A;  . LEEP  02/25/2010;  12/ 2012  dr Ronita Hipps office  . NASAL SEPTUM SURGERY  2006   Repair of deviated septum  . SIGMOIDOSCOPY  2009  . TONSILLECTOMY AND ADENOIDECTOMY  1995    SOCIAL HISTORY: Social History   Socioeconomic History  . Marital status: Single    Spouse name: Not on file  . Number of children: 1  . Years of education: Not on file  . Highest education level: Not on file  Occupational History  . Occupation: Triage  Social Needs  . Financial resource strain: Not on file  . Food insecurity:    Worry: Not on file    Inability: Not on file  . Transportation needs:    Medical: Not on file    Non-medical: Not on file  Tobacco Use  . Smoking status: Never Smoker  . Smokeless tobacco: Never Used  Substance and Sexual Activity  . Alcohol use: Not Currently    Comment: SELDOM, NONE IN  10 MONTHS  . Drug use: No  . Sexual activity: Yes    Birth control/protection: None  Lifestyle  . Physical activity:    Days per week: Not on file     Minutes per session: Not on file  . Stress: Not on file  Relationships  . Social connections:    Talks on phone: Not on file    Gets together: Not on file    Attends religious service: Not on file    Active member of club or organization: Not on file    Attends meetings of clubs or organizations: Not on file    Relationship status: Not on file  . Intimate partner violence:    Fear of current or ex partner: Not on file    Emotionally abused: Not on file    Physically abused: Not on file    Forced sexual activity: Not on file  Other Topics Concern  . Not on file  Social History Narrative  . Not on file    FAMILY HISTORY: Family History  Problem Relation Age of Onset  . Leukemia Other   . Diabetes Other   . Diabetes Mother   .  Skin cancer Mother   . Hyperlipidemia Mother   . Hypertension Father   . Asthma Father   . Skin cancer Maternal Aunt   . Multiple myeloma Maternal Aunt   . Testicular cancer Maternal Uncle   . Hyperlipidemia Maternal Grandmother   . AAA (abdominal aortic aneurysm) Maternal Grandmother   . COPD Maternal Grandmother   . Prostate cancer Maternal Grandfather   . AAA (abdominal aortic aneurysm) Maternal Grandfather     ALLERGIES:  is allergic to sulfa antibiotics; adhesive [tape]; other; relpax [eletriptan hydrobromide]; and ultram [tramadol].  MEDICATIONS:  Current Outpatient Medications  Medication Sig Dispense Refill  . albuterol (PROVENTIL HFA;VENTOLIN HFA) 108 (90 Base) MCG/ACT inhaler Inhale 2 puffs into the lungs every 6 (six) hours as needed for wheezing or shortness of breath. 1 Inhaler 1  . ALPRAZolam (XANAX) 0.5 MG tablet Take 1 tablet (0.5 mg total) by mouth at bedtime as needed for anxiety. 30 tablet 3  . butalbital-acetaminophen-caffeine (FIORICET, ESGIC) 50-325-40 MG tablet Take 1-2 tablets by mouth every 4 (four) hours as needed for migraine.     Marland Kitchen ibuprofen (ADVIL,MOTRIN) 800 MG tablet Take 1 tablet (800 mg total) by mouth every 8  (eight) hours as needed. 30 tablet 0  . LamoTRIgine (LAMICTAL XR) 200 MG TB24 24 hour tablet Take 2 tablets (400 mg total) by mouth 2 (two) times daily. (Patient taking differently: Take 400 mg by mouth 2 (two) times daily. Takes 1000 and 2200) 360 tablet 4  . LamoTRIgine 100 MG TB24 24 hour tablet Take 1 tablet (100 mg total) by mouth at bedtime. 30 tablet 11  . levETIRAcetam (KEPPRA) 750 MG tablet Take 2 tablets (1,500 mg total) by mouth 2 (two) times daily. 360 tablet 4  . lidocaine-prilocaine (EMLA) cream Apply to affected area once 30 g 3  . loratadine (CLARITIN) 10 MG tablet Take 10 mg by mouth every morning.     . montelukast (SINGULAIR) 10 MG tablet TAKE 1 TABLET BY MOUTH EVERYDAY AT BEDTIME 30 tablet 0  . Multiple Vitamins-Minerals (CENTRUM WOMEN) TABS Take 1 tablet by mouth daily.    . ondansetron (ZOFRAN) 8 MG tablet Take 1 tablet (8 mg total) by mouth 2 (two) times daily as needed. Start on the third day after chemotherapy. 30 tablet 1  . prochlorperazine (COMPAZINE) 10 MG tablet Take 1 tablet (10 mg total) by mouth every 6 (six) hours as needed (Nausea or vomiting). 30 tablet 1  . SYNTHROID 150 MCG tablet Take 1 tablet (150 mcg total) by mouth daily before breakfast. 90 tablet 3   No current facility-administered medications for this visit.     REVIEW OF SYSTEMS:   Constitutional: Denies fevers, chills or abnormal night sweats Eyes: Denies blurriness of vision, double vision or watery eyes Ears, nose, mouth, throat, and face: Denies mucositis or sore throat Respiratory: Denies cough, dyspnea or wheezes Cardiovascular: Denies palpitation, chest discomfort or lower extremity swelling Gastrointestinal:  Denies nausea, heartburn or change in bowel habits Skin: Denies abnormal skin rashes Lymphatics: Denies new lymphadenopathy or easy bruising Neurological:Denies numbness, tingling or new weaknesses Behavioral/Psych: Mood is stable, no new changes  All other systems were reviewed  with the patient and are negative.  PHYSICAL EXAMINATION: ECOG PERFORMANCE STATUS: 0 - Asymptomatic  Vitals:   07/08/18 1426  BP: 116/68  Pulse: 95  Resp: 18  Temp: 98 F (36.7 C)  SpO2: 99%   Filed Weights   07/08/18 1426  Weight: 160 lb 3.2 oz (72.7 kg)  GENERAL:alert, no distress and comfortable SKIN: skin color, texture, turgor are normal, no rashes or significant lesions EYES: normal, conjunctiva are pink and non-injected, sclera clear OROPHARYNX:no exudate, no erythema and lips, buccal mucosa, and tongue normal  NECK: supple, thyroid normal size, non-tender, without nodularity LYMPH:  no palpable lymphadenopathy in the cervical, axillary or inguinal LUNGS: clear to auscultation and percussion with normal breathing effort HEART: regular rate & rhythm and no murmurs and no lower extremity edema ABDOMEN:abdomen soft, non-tender and normal bowel sounds Musculoskeletal:no cyanosis of digits and no clubbing  PSYCH: alert & oriented x 3 with fluent speech NEURO: no focal motor/sensory deficits  LABORATORY DATA:  I have reviewed the data as listed Lab Results  Component Value Date   WBC 12.3 (H) 06/02/2018   HGB 12.7 06/02/2018   HCT 41.6 06/02/2018   MCV 83.4 06/02/2018   PLT 255 06/02/2018   Recent Labs    04/14/18 1233 05/24/18 1441 06/02/18 0450  NA 140 139 139  K 4.1 4.3 3.8  CL 109 104 110  CO2 '22 27 23  ' GLUCOSE 90 89 103*  BUN '8 11 9  ' CREATININE 0.61 0.69 0.73  CALCIUM 9.3 9.5 8.5*  GFRNONAA >60 >60 >60  GFRAA >60 >60 >60  PROT 7.5  --   --   ALBUMIN 4.4  --   --   AST 20  --   --   ALT 19  --   --   ALKPHOS 73  --   --   BILITOT 0.6  --   --     RADIOGRAPHIC STUDIES: I have personally reviewed the radiological images as listed and agreed with the findings in the report. Ct Chest W Contrast  Result Date: 06/22/2018 CLINICAL DATA:  37 year old female with history of abnormal Pap smear in October 2019, diagnosed with cervical cancer. Status  post hysterectomy on 06/01/2018. Staging examination prior to chemotherapy and radiation therapy. EXAM: CT CHEST, ABDOMEN, AND PELVIS WITH CONTRAST TECHNIQUE: Multidetector CT imaging of the chest, abdomen and pelvis was performed following the standard protocol during bolus administration of intravenous contrast. CONTRAST:  123m OMNIPAQUE IOHEXOL 300 MG/ML  SOLN COMPARISON:  CT the abdomen and pelvis 09/12/2009. FINDINGS: CT CHEST FINDINGS Cardiovascular: Heart size is normal. There is no significant pericardial fluid, thickening or pericardial calcification. No atherosclerotic calcifications noted in the thoracic aorta or the coronary arteries. Mediastinum/Nodes: No pathologically enlarged mediastinal or hilar lymph nodes. Esophagus is unremarkable in appearance. No axillary lymphadenopathy. Lungs/Pleura: There are 2 small pulmonary nodules in the lungs, largest of which measures 5 mm in the left upper lobe (axial image 45 of series 4). 3 mm subpleural nodule associated with the minor fissure (axial image 82 of series 4) is strongly favored to represent a subpleural lymph node. No other larger more suspicious appearing pulmonary nodules or masses are noted. No acute consolidative airspace disease. No pleural effusions. Musculoskeletal: There are no aggressive appearing lytic or blastic lesions noted in the visualized portions of the skeleton. 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 No other larger more suspicious appearing hepatic lesions are noted. No intra or extrahepatic biliary ductal dilatation. Gallbladder is normal in appearance. Pancreas: No  pancreatic mass. No  pancreatic ductal dilatation. No pancreatic or peripancreatic fluid or inflammatory changes. Spleen: Unremarkable. Adrenals/Urinary Tract: 4 mm in nonobstructive calculus in the upper pole collecting system of the right kidney. Kidneys and bilateral adrenal glands are otherwise normal. No hydroureteronephrosis. Urinary bladder is normal in appearance. Stomach/Bowel: Normal appearance of the stomach. No pathologic dilatation of small bowel or colon. Normal appendix. Vascular/Lymphatic: No significant atherosclerotic disease, aneurysm or dissection noted in the abdominal or pelvic vasculature. No lymphadenopathy noted in the abdomen or pelvis. Reproductive: Status post total abdominal hysterectomy and bilateral oophoropexy. Other: No significant volume of ascites.  No pneumoperitoneum. Musculoskeletal: There are no aggressive appearing lytic or blastic lesions noted in the visualized portions of the skeleton. IMPRESSION: 1. Status post total abdominal hysterectomy. No definitive findings to strongly suggest metastatic disease to the chest, abdomen or pelvis. 2. 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 Further evaluation with nonemergent MRI of the abdomen with and without IV gadolinium is strongly recommended to provide definitive characterization of this lesion in the near future. 3. Two small pulmonary nodules measuring 5 mm or less in size, nonspecific but statistically likely benign. Attention on routine follow-up studies is recommended to ensure their stability. 4. 4 mm nonobstructive calculus in the upper pole collecting system of the right kidney. No ureteral stones or findings of urinary tract obstruction are noted at this time. Electronically Signed   By: Vinnie Langton M.D.   On: 06/22/2018 15:09   Ct Abdomen Pelvis W Contrast  Result Date: 06/22/2018 CLINICAL DATA:  37 year old female with history of abnormal Pap smear in October 2019,  diagnosed with cervical cancer. Status post hysterectomy on 06/01/2018. Staging examination prior to chemotherapy and radiation therapy. EXAM: CT CHEST, ABDOMEN, AND PELVIS WITH CONTRAST TECHNIQUE: Multidetector CT imaging of the chest, abdomen and pelvis was performed following the standard protocol during bolus administration of intravenous contrast. CONTRAST:  194m OMNIPAQUE IOHEXOL 300 MG/ML  SOLN COMPARISON:  CT the abdomen and pelvis 09/12/2009. FINDINGS: CT CHEST FINDINGS Cardiovascular: Heart size is normal. There is no significant pericardial fluid, thickening or pericardial calcification. No atherosclerotic calcifications noted in the thoracic aorta or the coronary arteries. Mediastinum/Nodes: No pathologically enlarged mediastinal or hilar lymph nodes. Esophagus is unremarkable in appearance. No axillary lymphadenopathy. Lungs/Pleura: There are 2 small pulmonary nodules in the lungs, largest of which measures 5 mm in the left upper lobe (axial image 45 of series 4). 3 mm subpleural nodule associated with the minor fissure (axial image 82 of series 4) is strongly favored to represent a subpleural lymph node. No other larger more suspicious appearing pulmonary nodules or masses are noted. No acute consolidative airspace disease. No pleural effusions. Musculoskeletal: There are no aggressive appearing lytic or blastic lesions noted in the visualized portions of the skeleton. 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 No other larger more suspicious appearing hepatic lesions are noted. No intra or extrahepatic biliary ductal dilatation. Gallbladder is normal in appearance.  Pancreas: No pancreatic mass. No pancreatic ductal dilatation. No pancreatic or peripancreatic fluid or inflammatory changes. Spleen: Unremarkable. Adrenals/Urinary Tract: 4 mm in nonobstructive calculus in the upper  pole collecting system of the right kidney. Kidneys and bilateral adrenal glands are otherwise normal. No hydroureteronephrosis. Urinary bladder is normal in appearance. Stomach/Bowel: Normal appearance of the stomach. No pathologic dilatation of small bowel or colon. Normal appendix. Vascular/Lymphatic: No significant atherosclerotic disease, aneurysm or dissection noted in the abdominal or pelvic vasculature. No lymphadenopathy noted in the abdomen or pelvis. Reproductive: Status post total abdominal hysterectomy and bilateral oophoropexy. Other: No significant volume of ascites.  No pneumoperitoneum. Musculoskeletal: There are no aggressive appearing lytic or blastic lesions noted in the visualized portions of the skeleton. IMPRESSION: 1. Status post total abdominal hysterectomy. No definitive findings to strongly suggest metastatic disease to the chest, abdomen or pelvis. 2. 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 Further evaluation with nonemergent MRI of the abdomen with and without IV gadolinium is strongly recommended to provide definitive characterization of this lesion in the near future. 3. Two small pulmonary nodules measuring 5 mm or less in size, nonspecific but statistically likely benign. Attention on routine follow-up studies is recommended to ensure their stability. 4. 4 mm nonobstructive calculus in the upper pole collecting system of the right kidney. No ureteral stones or findings of urinary tract obstruction are noted at this time. Electronically Signed   By: Vinnie Langton M.D.   On: 06/22/2018 15:09   Nm Pet Image Initial (pi) Skull Base To Thigh  Result Date: 07/02/2018 CLINICAL DATA:  Initial treatment  strategy for vaginal cancer. EXAM: NUCLEAR MEDICINE PET SKULL BASE TO THIGH TECHNIQUE: 7.8 mCi F-18 FDG was injected intravenously. Full-ring PET imaging was performed from the skull base to thigh after the radiotracer. CT data was obtained and used for attenuation correction and anatomic localization. Fasting blood glucose: 98 mg/dl COMPARISON:  CT scan 06/22/2018 FINDINGS: Mediastinal blood pool activity: SUV max 1.6 NECK: Symmetric intense activity identified in the oropharynx bilaterally. Left-sided cervical lymph node (22/3) measures 11 mm with SUV max = 4.4. 14 mm short axis right cervical lymph node (23/3) demonstrates SUV max = 3.8. Incidental CT findings: none CHEST: No hypermetabolic mediastinal or hilar nodes. No suspicious pulmonary nodules on the CT scan. Incidental CT findings: Tiny bilateral pulmonary nodules are evident, as seen on recent diagnostic chest CT. These are below threshold for resolution on PET imaging. No focal consolidation or pleural effusion. ABDOMEN/PELVIS: No abnormal hypermetabolic activity within the liver, pancreas, adrenal glands, or spleen. No hypermetabolic lymph nodes in the abdomen. 12 mm short axis right internal iliac lymph node (735/3) is hypermetabolic with SUV max = 29.9. 8 mm short axis right internal iliac lymph node (165/3) is also hypermetabolic with SUV max = 6.7. Uterus surgically absent with hypermetabolism identified in the vaginal cuff demonstrating SUV max = 7.2. Diffuse uptake identified in the right ovary, nonspecific. Incidental CT findings: noneTiny nonobstructing stone identified upper pole right kidney. SKELETON: No focal hypermetabolic activity to suggest skeletal metastasis. Incidental CT findings: No worrisome lytic or sclerotic osseous abnormality. IMPRESSION: 1. Hypermetabolic right internal iliac lymph nodes, consistent with metastatic disease. 2. Hypermetabolic cervical lymph nodes bilaterally. 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 The patient does have diffuse, but symmetric hypermetabolic uptake in the nasopharynx and hypopharynx. Infectious/inflammatory etiology a consideration although neoplasm not excluded. 3. Hypermetabolic FDG accumulation in the right ovary, nonspecific with no gross mass lesion evident. Left ovary is high in the left pelvis with low level FDG uptake. 4. Tiny bilateral pulmonary nodules  as seen on previous diagnostic CT. These are below threshold size for reliable resolution on PET imaging. Electronically Signed   By: Misty Stanley M.D.   On: 07/02/2018 12:03    I spent 40 minutes counseling the patient face to face. The total time spent in the appointment was 60 minutes and more than 50% was on counseling.  All questions were answered. The patient knows to call the clinic with any problems, questions or concerns.  Heath Lark, MD 07/08/2018 3:22 PM

## 2018-07-08 NOTE — Telephone Encounter (Signed)
Gave avs and calendar waiting to hear about 12/13

## 2018-07-08 NOTE — Assessment & Plan Note (Signed)
She has no recurrence of seizures since last year.  She will continue antiseizure management per neurologist.

## 2018-07-09 ENCOUNTER — Other Ambulatory Visit: Payer: Self-pay | Admitting: Radiology

## 2018-07-09 NOTE — Progress Notes (Signed)
PATIENT: Kelly Mcclure DOB: 06-10-1981  Chief Complaint  Patient presents with  . Follow-up    Migraines, Seizures     HISTORICAL  Kelly Mcclure is a 37 years old right-handed female, accompanied by her husband Legrand Como, seen in refer by her obstetrician Dr.Taavon, Richard,for evaluation of seizure, initial evaluation was on March 19 2017  She had past medical history of cervical cancer, GERD, radioactive iodine thyroid ablation, hypothyroidism, chronic migraine, history of kidney stone, epilepsy, she is currently [redacted] weeks pregnant  She was diagnosed with epilepsy since 2002, has been under the care of [facility] neurologist Dr. Harrie Foreman.  I reviewed the most recent evaluation by Dr. Harrie Foreman on January 23 2017,   In June of 2002, she presented her first generalized tonic-clonic seizure while having the vacation at the beach, this happened in the setting of sleep deprivation, mild alcohol use  MRI of the brain, MRA of the brain showed no significant abnormality.  EEG at that time demonstrate generalized epileptiform discharge per description. She was started on Depakote 250 mg 3 times a day, she reported significant weight gain within short period of time, then she was switched to Topamax for extended period of time untill 2011, she was diagnosed with kidney stone.  She was switched to lamotrigine since 2011, at later multiple follow-up visits, there was description of spacing out spells, no myoclonic jerking, Keppra was added on later, which has helped her spells.but she still has occasionally recurrent spells  She  has been doing well taking Keppra 500 mg 3 tablets twice a day, lamotrigine 150 mg 3 tablets twice a day,she is currently [redacted] weeks pregnant, but still has occasionally space out spells, eyes rolled back, transient lapse of time, lasting few seconds.  On March 14 2017, when she was 6 months pregnant, her husband heard a loud screaming noise, saw patient body  tense up, foaming coming out of her mouth,lasting for few minutes, was taken to the emergency room by ambulance  Laboratory evaluations UA showed large amount of leukocytes, protein was elevated 100,UDS was negative, CBC was within normal limits, hemoglobin was 12.3,CMP showed low potassium 3.0,creatinine 0.56  Lamictal level was 2.3, 24 hours urine protein was less than 6, within normal limit  I personally reviewed MRI of the brain without contrast on March 16 2017,that was normal  She was given IV magnesium infusion, now she is on higher dose of lamotrigine XR 400 plus 50 mg twice a day, Keppra 1000 plus 750 mg twice a day, she tolerated the medication well,  UPDATE Jun 23 2017: Patient had elective C-section on May 27 2017, with healthy baby girl  Before delivery, May 25 2017, lamotrigine level 5.9, Keppra 14,  Postpartum November 1, lamotrigine 10.4, Keppra level 19.2,  On Jun 08 2017 She decreased her lamotrigine xr 200 mg to 2 tablets twice a day, stop 50 mg tablets  Decrease Keppra to 750 mg tablet 2 tablets twice a day  She is doing well there was no recurrent seizure she is not breast-feeding  UPDATE Dec 21 2017: She was doing really well until recently, her best friend passed away, she has worsening depression anxiety, has not been resting well, in the past few days, had recurrent spells of spacing out, her eyes rolled back, last less than 1 minute, no generalized seizure.  She is taking lamotrigine ER 200 mg 2 tablets twice a day, and Keppra 750 mg 2 tablets twice a day  UPDATE  Jul 09 2018: She had no recurrent seizure, tolerating current antiepileptic medications, Lamotrigine xr 552m daily, keppra 7532m2 tabs twice a day,  Migraine is under good control. She was recently diagnosed and treated for vaginal cancer.   REVIEW OF SYSTEMS: Full 14 system review of systems performed and notable only for as above  ALLERGIES: Allergies  Allergen Reactions  . Sulfa  Antibiotics Swelling    Tongue swelling, throat irritated  . Adhesive [Tape] Other (See Comments)    "skin burn"  . Other Swelling and Other (See Comments)    Magic mouth wash/ causes swelling of the tongue  . Relpax [Eletriptan Hydrobromide] Other (See Comments)    Stroke-like symptoms  . Ultram [Tramadol] Other (See Comments)    2004--  Per pt had seizure due to interaction with other medication she was taking at the time    HOME MEDICATIONS: Current Outpatient Medications  Medication Sig Dispense Refill  . albuterol (PROVENTIL HFA;VENTOLIN HFA) 108 (90 Base) MCG/ACT inhaler Inhale 2 puffs into the lungs every 6 (six) hours as needed for wheezing or shortness of breath. 1 Inhaler 1  . ALPRAZolam (XANAX) 0.5 MG tablet Take 1 tablet (0.5 mg total) by mouth at bedtime as needed for anxiety. 30 tablet 3  . butalbital-acetaminophen-caffeine (FIORICET, ESGIC) 50-325-40 MG tablet Take 1-2 tablets by mouth every 4 (four) hours as needed for migraine.     . Marland Kitchenbuprofen (ADVIL,MOTRIN) 800 MG tablet Take 1 tablet (800 mg total) by mouth every 8 (eight) hours as needed. 30 tablet 0  . LamoTRIgine (LAMICTAL XR) 200 MG TB24 24 hour tablet Take 2 tablets (400 mg total) by mouth 2 (two) times daily. (Patient taking differently: Take 400 mg by mouth 2 (two) times daily. Takes 1000 and 2200) 360 tablet 4  . LamoTRIgine 100 MG TB24 24 hour tablet Take 1 tablet (100 mg total) by mouth at bedtime. 30 tablet 11  . levETIRAcetam (KEPPRA) 750 MG tablet Take 2 tablets (1,500 mg total) by mouth 2 (two) times daily. 360 tablet 4  . lidocaine-prilocaine (EMLA) cream Apply to affected area once 30 g 3  . loratadine (CLARITIN) 10 MG tablet Take 10 mg by mouth every morning.     . montelukast (SINGULAIR) 10 MG tablet TAKE 1 TABLET BY MOUTH EVERYDAY AT BEDTIME 30 tablet 0  . Multiple Vitamins-Minerals (CENTRUM WOMEN) TABS Take 1 tablet by mouth daily.    . ondansetron (ZOFRAN) 8 MG tablet Take 1 tablet (8 mg total) by  mouth 2 (two) times daily as needed. Start on the third day after chemotherapy. 30 tablet 1  . prochlorperazine (COMPAZINE) 10 MG tablet Take 1 tablet (10 mg total) by mouth every 6 (six) hours as needed (Nausea or vomiting). 30 tablet 1  . SYNTHROID 150 MCG tablet Take 1 tablet (150 mcg total) by mouth daily before breakfast. 90 tablet 3   No current facility-administered medications for this visit.     PAST MEDICAL HISTORY: Past Medical History:  [facility] Date  [facility]   [facility]   . Cough    WITH SEASONAL ALLEGIES, NONPRODUCTIVE LAST 2 DAYS  . H/O radioactive iodine thyroid ablation 12/2009  . 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 History of cervical dysplasia   . History of hyperthyroidism    per pt dx 2000 -- s/p RAI  2011  . History of kidney stones 2011  . Hx of varicella   . Hypothyroidism, postradioiodine therapy  endocrinology-  dr Cruzita Lederer  . Migraines   . Mild persistent asthma 01/19/2018   followed by dr Verlin Fester ([facility]) MILD (SUBCLINICAL PER PT)  . Seizure disorder [facility]) neurologist-  dr Krista Blue   Dx age 6-- per pt subclinical epilepsy-- controlled w/ meds,  last seizure 08/ 2018  . 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 Vaginal Pap smear, abnormal     PAST SURGICAL HISTORY: Past Surgical History:  Procedure Laterality Date  . ABDOMINAL CERCLAGE N/A 11/19/2016   Procedure: LAPAROSCOPIC TRANSABDOMINAL CERVICAL-ISTHMUSx2;  Surgeon: Governor Specking, MD;  Location: [facility];  Service: Gynecology;  Laterality: N/A;  with ultrasound guidance. Start laparoscopically HOLD OPEN ABDOMINAL ITEMS   . CERCLAGE LAPAROSCOPIC ABDOMINAL N/A 11/19/2016   Procedure: CERCLAGE LAPAROSCOPIC ABDOMINAL;  Surgeon: Governor Specking, MD;  Location: [facility];  Service: Gynecology;  Laterality: N/A;  . CERVICAL CONIZATION W/BX N/A 02/18/2018   Procedure: COLD KNIFE CONIZATION OF CERVIX WITH POSSIBLE BIOPSY;  Surgeon: [name], MD;  Location: [place];  Service: Gynecology;  Laterality: N/A;  . CESAREAN SECTION N/A 05/27/2017   Procedure: Primary CESAREAN SECTION;  Surgeon: Brien Few, MD;  Location: [place];  Service: Obstetrics;  Laterality: N/A;  EDD: 11/4/18Allergy: Adhesive, Relpax, Sulfa, Ultram  . COLD KNIFE CONIZATION  03-29-2010   dr Ronita Hipps  [facility]  . DILATION AND CURETTAGE OF UTERUS  2009  . DILATION AND CURETTAGE OF UTERUS N/A 03/01/2013   Procedure: CERVICAL DILATATION AND ENDOCERVICAL CURRETAGE AND VAAGINAL BIOPIES;  Surgeon: Alvino Chapel, MD;  Location: [facility];  Service: Gynecology;  Laterality: N/A;  . LEEP  02/25/2010;  12/ 2012  dr Ronita Hipps office  . NASAL SEPTUM SURGERY  2006   Repair of deviated septum  . SIGMOIDOSCOPY  2009  . TONSILLECTOMY AND ADENOIDECTOMY  1995    FAMILY HISTORY: Family History  Problem Relation Age of Onset  . Leukemia Other   . Diabetes Other   . Diabetes Mother   . Skin cancer Mother   . Hyperlipidemia Mother   . Hypertension Father   . Asthma Father   . Skin cancer Maternal Aunt   . Multiple myeloma Maternal Aunt   . Testicular cancer Maternal Uncle   . Hyperlipidemia Maternal Grandmother   . AAA (abdominal aortic aneurysm) Maternal Grandmother   . COPD Maternal Grandmother   . Prostate cancer Maternal Grandfather   . AAA (abdominal aortic aneurysm) Maternal Grandfather     SOCIAL HISTORY:  Social History   Social History  . Marital status: Single    Spouse name: N/A  . Number of children: N/A  . Years of education: N/A   Occupational History  . 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   Social History Main Topics  . Smoking status: Never Smoker  . Smokeless tobacco: Never Used  . Alcohol use No     Comment: Rare  . Drug use: No  . Sexual activity: Yes   Other Topics Concern  . Not on file   Social History Narrative  . No narrative on file     PHYSICAL EXAM   Vitals:   07/08/18 1458  BP: (!) 135/92    Pulse: (!) 114  Weight: 158 lb (71.7 kg)  Height: 5' 7.5" (1.715 m)    Not recorded      Body mass index is 24.38 kg/m.  PHYSICAL EXAMNIATION:  Gen: NAD, conversant, well nourised, obese, well groomed  Cardiovascular: Regular rate rhythm, no peripheral edema, warm, nontender. Eyes: Conjunctivae clear without exudates or hemorrhage Neck: Supple, no carotid bruits. Pulmonary: Clear to auscultation bilaterally   NEUROLOGICAL EXAM:  MENTAL STATUS: Speech:    Speech is normal; fluent and spontaneous with normal comprehension.  Cognition:     Orientation to time, place and person     Normal recent and remote memory     Normal Attention span and concentration     Normal Language, naming, repeating,spontaneous speech     Fund of knowledge   CRANIAL NERVES: CN II: Visual fields are full to confrontation. Pupils are round equal and briskly reactive to light. CN III, IV, VI: extraocular movement are normal. No ptosis. CN V: Facial sensation is intact to pinprick in all 3 divisions bilaterally. Corneal responses are intact.  CN VII: Face is symmetric with normal eye closure and smile. CN VIII: Hearing is normal to rubbing fingers CN IX, X: Palate elevates symmetrically. Phonation is normal. CN XI: Head turning and shoulder shrug are intact CN XII: Tongue is midline with normal movements and no atrophy.  MOTOR: There is no pronator drift of out-stretched arms. Muscle bulk and tone are normal. Muscle strength is normal.  REFLEXES: Reflexes are 2+ and symmetric at the biceps, triceps, knees, and ankles. Plantar responses are flexor.  SENSORY: Intact to light touch, pinprick, positional sensation and vibratory sensation are intact in fingers and toes.  COORDINATION: Rapid alternating movements and fine finger movements are intact. There is no dysmetria on finger-to-nose and heel-knee-shin.    GAIT/STANCE: Posture is normal. Gait is steady with normal steps,  base, arm swing, and turning. Heel and toe walking are normal. Tandem gait is normal.  Romberg is absent.   DIAGNOSTIC DATA (LABS, IMAGING, TESTING) - I reviewed patient records, labs, notes, testing and imaging myself where available.   ASSESSMENT AND PLAN  JOCHEBED BILLS is a 37 y.o. female    Idiopathic generalized epilepsy   Recurrent generalized seizure on March 14 2017 while she was 6 months pregnant  EEG showed frequent generalized epileptiform discharge,  Doing well taking current lamotrigine XR 200 mg 2 tablets twice a day, Keppra 750 mg 2 tablets twice a day  Check level    Dwyane Luo Ph.D.  [facility] [address], [place], [place] [address] Ph: [phone] Fax: [phone]  CC: Referring Provider

## 2018-07-11 ENCOUNTER — Other Ambulatory Visit: Payer: Self-pay | Admitting: Allergy and Immunology

## 2018-07-12 ENCOUNTER — Telehealth: Payer: Self-pay | Admitting: Neurology

## 2018-07-12 ENCOUNTER — Other Ambulatory Visit: Payer: Self-pay

## 2018-07-12 ENCOUNTER — Ambulatory Visit (HOSPITAL_COMMUNITY)
Admission: RE | Admit: 2018-07-12 | Discharge: 2018-07-12 | Disposition: A | Discharge status: Home / Self Care | Payer: 59 | Source: Ambulatory Visit | Attending: Hematology and Oncology | Admitting: Hematology and Oncology

## 2018-07-12 ENCOUNTER — Encounter (HOSPITAL_COMMUNITY): Payer: Self-pay

## 2018-07-12 ENCOUNTER — Encounter: Payer: Self-pay | Admitting: Hematology and Oncology

## 2018-07-12 DIAGNOSIS — Z888 Allergy status to other drugs, medicaments and biological substances status: Secondary | ICD-10-CM | POA: Insufficient documentation

## 2018-07-12 DIAGNOSIS — Z885 Allergy status to narcotic agent status: Secondary | ICD-10-CM | POA: Insufficient documentation

## 2018-07-12 DIAGNOSIS — Z808 Family history of malignant neoplasm of other organs or systems: Secondary | ICD-10-CM | POA: Insufficient documentation

## 2018-07-12 DIAGNOSIS — Z79899 Other long term (current) drug therapy: Secondary | ICD-10-CM | POA: Diagnosis not present

## 2018-07-12 DIAGNOSIS — E890 Postprocedural hypothyroidism: Secondary | ICD-10-CM | POA: Diagnosis not present

## 2018-07-12 DIAGNOSIS — Z8042 Family history of malignant neoplasm of prostate: Secondary | ICD-10-CM | POA: Diagnosis not present

## 2018-07-12 DIAGNOSIS — Z90710 Acquired absence of both cervix and uterus: Secondary | ICD-10-CM | POA: Diagnosis not present

## 2018-07-12 DIAGNOSIS — Z8043 Family history of malignant neoplasm of testis: Secondary | ICD-10-CM | POA: Diagnosis not present

## 2018-07-12 DIAGNOSIS — Z8249 Family history of ischemic heart disease and other diseases of the circulatory system: Secondary | ICD-10-CM | POA: Diagnosis not present

## 2018-07-12 DIAGNOSIS — G40909 Epilepsy, unspecified, not intractable, without status epilepticus: Secondary | ICD-10-CM | POA: Diagnosis not present

## 2018-07-12 DIAGNOSIS — Z90722 Acquired absence of ovaries, bilateral: Secondary | ICD-10-CM | POA: Diagnosis not present

## 2018-07-12 DIAGNOSIS — C52 Malignant neoplasm of vagina: Secondary | ICD-10-CM | POA: Diagnosis not present

## 2018-07-12 DIAGNOSIS — Z882 Allergy status to sulfonamides status: Secondary | ICD-10-CM | POA: Diagnosis not present

## 2018-07-12 DIAGNOSIS — Z807 Family history of other malignant neoplasms of lymphoid, hematopoietic and related tissues: Secondary | ICD-10-CM | POA: Diagnosis not present

## 2018-07-12 DIAGNOSIS — Z806 Family history of leukemia: Secondary | ICD-10-CM | POA: Diagnosis not present

## 2018-07-12 HISTORY — PX: IR IMAGING GUIDED PORT INSERTION: IMG5740

## 2018-07-12 LAB — CBC
HCT: 47.8 % — ABNORMAL HIGH (ref 36.0–46.0)
Hemoglobin: 14.8 g/dL (ref 12.0–15.0)
MCH: 25.6 pg — ABNORMAL LOW (ref 26.0–34.0)
MCHC: 31 g/dL (ref 30.0–36.0)
MCV: 82.8 fL (ref 80.0–100.0)
Platelets: 308 10*3/uL (ref 150–400)
RBC: 5.77 MIL/uL — AB (ref 3.87–5.11)
RDW: 14.2 % (ref 11.5–15.5)
WBC: 6.3 10*3/uL (ref 4.0–10.5)
nRBC: 0 % (ref 0.0–0.2)

## 2018-07-12 LAB — PROTIME-INR
INR: 0.95
Prothrombin Time: 12.6 seconds (ref 11.4–15.2)

## 2018-07-12 LAB — APTT: aPTT: 31 seconds (ref 24–36)

## 2018-07-12 MED ORDER — MIDAZOLAM HCL 2 MG/2ML IJ SOLN
INTRAMUSCULAR | Status: AC
  Filled 2018-07-12: qty 4

## 2018-07-12 MED ORDER — LIDOCAINE-EPINEPHRINE (PF) 2 %-1:200000 IJ SOLN
INTRAMUSCULAR | Status: AC
  Administered 2018-07-12: 20 mL
  Filled 2018-07-12: qty 10

## 2018-07-12 MED ORDER — SODIUM CHLORIDE 0.9 % IV SOLN
INTRAVENOUS | Status: AC
  Administered 2018-07-12: 11:00:00 via INTRAVENOUS

## 2018-07-12 MED ORDER — CEFAZOLIN SODIUM-DEXTROSE 2-4 GM/100ML-% IV SOLN: 2.0000 g | Freq: Once | INTRAVENOUS | Status: DC

## 2018-07-12 MED ORDER — CEFAZOLIN SODIUM-DEXTROSE 2-4 GM/100ML-% IV SOLN
INTRAVENOUS | Status: AC
  Filled 2018-07-12: qty 100

## 2018-07-12 MED ORDER — MIDAZOLAM HCL 2 MG/2ML IJ SOLN
INTRAMUSCULAR | Status: AC | PRN
  Administered 2018-07-12 (×3): 1 mg via INTRAVENOUS

## 2018-07-12 MED ORDER — LIDOCAINE-EPINEPHRINE (PF) 2 %-1:200000 IJ SOLN
INTRAMUSCULAR | Status: AC
  Filled 2018-07-12: qty 10

## 2018-07-12 MED ORDER — FENTANYL CITRATE (PF) 100 MCG/2ML IJ SOLN
INTRAMUSCULAR | Status: AC
  Filled 2018-07-12: qty 4

## 2018-07-12 MED ORDER — HEPARIN SOD (PORK) LOCK FLUSH 100 UNIT/ML IV SOLN
INTRAVENOUS | Status: AC
  Administered 2018-07-12: 100 [IU]
  Filled 2018-07-12: qty 5

## 2018-07-12 MED ORDER — FENTANYL CITRATE (PF) 100 MCG/2ML IJ SOLN
INTRAMUSCULAR | Status: AC | PRN
  Administered 2018-07-12 (×2): 50 ug via INTRAVENOUS

## 2018-07-12 NOTE — Procedures (Signed)
  Procedure: R IJ Port    EBL:   minimal Complications:  none immediate  See full dictation in [facility] PACS.  D. Alliyah Roesler MD Main # [phone] Pager  [phone]    

## 2018-07-12 NOTE — Consult Note (Addendum)
Chief Complaint: Patient was seen in consultation today for port a cath placement    Referring Physician(s): Gorsuch,Ni  Supervising Physician: Arne Cleveland  Patient Status: [facility] - Out-pt  History of Present Illness: Kelly Mcclure is a 37 y.o. female with hx vaginal cancer, s/p robotic assisted lap total hysterectomy with BSO/upper vaginectomy/oophoropexy on 06/01/18. She presents today for port a cath placement for planned chemotherapy. Additional PMH as below.   Past Medical History:  Diagnosis Date  . Cancer [facility]) DX 2011   CERVICAL   . Cough    WITH SEASONAL ALLEGIES, NONPRODUCTIVE LAST 2 DAYS  . H/O radioactive iodine thyroid ablation 12/2009  . 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 History of cervical dysplasia   . History of hyperthyroidism    per pt dx 2000 -- s/p RAI  2011  . History of kidney stones 2011  . Hx of varicella   . Hypothyroidism, postradioiodine therapy    endocrinology-  dr Cruzita Lederer  . Migraines   . Mild persistent asthma 01/19/2018   followed by dr Verlin Fester ([facility]) MILD (SUBCLINICAL PER PT)  . 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 Vaginal Pap smear, abnormal     Past Surgical History:  Procedure Laterality Date  . ABDOMINAL CERCLAGE N/A 11/19/2016   Procedure: LAPAROSCOPIC TRANSABDOMINAL CERVICAL-ISTHMUSx2;  Surgeon: Governor Specking, MD;  Location: [place] [facility];  Service: Gynecology;  Laterality: N/A;  with ultrasound guidance. Start laparoscopically HOLD OPEN ABDOMINAL ITEMS   . CERCLAGE LAPAROSCOPIC ABDOMINAL N/A 11/19/2016   Procedure: CERCLAGE LAPAROSCOPIC ABDOMINAL;  Surgeon: Governor Specking, MD;  Location: [place] [facility];  Service: Gynecology;  Laterality: N/A;  . CERVICAL CONIZATION W/BX N/A 02/18/2018   Procedure: COLD KNIFE CONIZATION OF CERVIX WITH  POSSIBLE BIOPSY;  Surgeon: [name], MD;  Location: [place];  Service: Gynecology;  Laterality: N/A;  . CESAREAN SECTION N/A 05/27/2017   Procedure: Primary CESAREAN SECTION;  Surgeon: Brien Few, MD;  Location: [place];  Service: Obstetrics;  Laterality: N/A;  EDD: 11/4/18Allergy: Adhesive, Relpax, Sulfa, Ultram  . COLD KNIFE CONIZATION  03-29-2010   dr Ronita Hipps  [facility]  . DILATION AND CURETTAGE OF UTERUS  2009  . DILATION AND CURETTAGE OF UTERUS N/A 03/01/2013   Procedure: CERVICAL DILATATION AND ENDOCERVICAL CURRETAGE AND VAAGINAL BIOPIES;  Surgeon: Alvino Chapel, MD;  Location: [facility];  Service: Gynecology;  Laterality: N/A;  . LEEP  02/25/2010;  12/ 2012  dr Ronita Hipps office  . NASAL SEPTUM SURGERY  2006   Repair of deviated septum  . SIGMOIDOSCOPY  2009  . TONSILLECTOMY AND ADENOIDECTOMY  1995    Allergies: Sulfa antibiotics; Adhesive [tape]; Other; Relpax [eletriptan hydrobromide]; and Ultram [tramadol]  Medications: Prior to Admission medications   Medication Sig Start Date End Date Taking? Authorizing Provider  albuterol (PROVENTIL HFA;VENTOLIN HFA) 108 (90 Base) MCG/ACT inhaler Inhale 2 puffs into the lungs every 6 (six) hours as needed for wheezing or shortness of breath. 01/19/18  Yes Bobbitt, Sedalia Muta, MD  ALPRAZolam Duanne Moron) 0.5 MG tablet Take 1 tablet (0.5 mg total) by mouth at bedtime as needed for anxiety. 02/26/18  Yes Marcial Pacas, MD  butalbital-acetaminophen-caffeine (FIORICET, ESGIC) [phone] MG tablet Take 1-2 tablets by mouth every 4 (four) hours as needed for migraine.    Yes  [provider]  LamoTRIgine (LAMICTAL XR) 200 MG TB24 24 hour tablet Take 2 tablets (400 mg total) by mouth 2 (two) times daily. Patient taking differently: Take 400 mg by mouth 2 (two) times daily. Takes 1000 and 2200 12/21/17  Yes Marcial Pacas, MD  LamoTRIgine 100 MG TB24 24 hour tablet Take 1 tablet (100 mg total) by mouth at bedtime. 12/21/17  Yes  Marcial Pacas, MD  levETIRAcetam (KEPPRA) 750 MG tablet Take 2 tablets (1,500 mg total) by mouth 2 (two) times daily. 12/21/17  Yes Marcial Pacas, MD  loratadine (CLARITIN) 10 MG tablet Take 10 mg by mouth every morning.    Yes [provider]  montelukast (SINGULAIR) 10 MG tablet TAKE 1 TABLET BY MOUTH EVERYDAY AT BEDTIME 07/12/18  Yes Bobbitt, Sedalia Muta, MD  SYNTHROID 150 MCG tablet Take 1 tablet (150 mcg total) by mouth daily before breakfast. 03/25/18  Yes Philemon Kingdom, MD  ibuprofen (ADVIL,MOTRIN) 800 MG tablet Take 1 tablet (800 mg total) by mouth every 8 (eight) hours as needed. 06/02/18   [name] D, NP  lidocaine-prilocaine (EMLA) cream Apply to affected area once 07/08/18   Heath Lark, MD  Multiple Vitamins-Minerals (CENTRUM WOMEN) TABS Take 1 tablet by mouth daily.    [provider]  ondansetron (ZOFRAN) 8 MG tablet Take 1 tablet (8 mg total) by mouth 2 (two) times daily as needed. Start on the third day after chemotherapy. 07/08/18   Heath Lark, MD  prochlorperazine (COMPAZINE) 10 MG tablet Take 1 tablet (10 mg total) by mouth every 6 (six) hours as needed (Nausea or vomiting). 07/08/18   Heath Lark, MD     Family History  Problem Relation Age of Onset  . Leukemia Other   . Diabetes Other   . Diabetes Mother   . Skin cancer Mother   . Hyperlipidemia Mother   . Hypertension Father   . Asthma Father   . Skin cancer Maternal Aunt   . Multiple myeloma Maternal Aunt   . Testicular cancer Maternal Uncle   . Hyperlipidemia Maternal Grandmother   . AAA (abdominal aortic aneurysm) Maternal Grandmother   . COPD Maternal Grandmother   . Prostate cancer Maternal Grandfather   . AAA (abdominal aortic aneurysm) Maternal Grandfather     Social History   Socioeconomic History  . Marital status: Single    Spouse name: Not on file  . Number of children: 1  . Years of education: Not on file  . Highest education level: Not on file  Occupational History  .  Occupation: Triage  Social Needs  . Financial resource strain: Not on file  . Food insecurity:    Worry: Not on file    Inability: Not on file  . Transportation needs:    Medical: Not on file    Non-medical: Not on file  Tobacco Use  . Smoking status: Never Smoker  . Smokeless tobacco: Never Used  Substance and Sexual Activity  . Alcohol use: Not Currently    Comment: SELDOM, NONE IN  10 MONTHS  . Drug use: No  . Sexual activity: Yes    Birth control/protection: None  Lifestyle  . Physical activity:    Days per week: Not on file    Minutes per session: Not on file  . Stress: Not on file  Relationships  . Social connections:    Talks on phone: Not on file    Gets together: Not on file    Attends religious service: Not on file  Active member of club or organization: Not on file    Attends meetings of clubs or organizations: Not on file    Relationship status: Not on file  Other Topics Concern  . Not on file  Social History Narrative  . Not on file      Review of Systems denies fever,CP,dyspnea, cough, abd/back pain,N/V or bleeding; she does have occ HA  Vital Signs: BP (!) 128/99   Pulse 75   Temp 98.1 F (36.7 C) (Oral)   Resp 18   Ht 5' 7.5" (1.715 m)   Wt 150 lb (68 kg)   LMP 05/17/2018 (Exact Date)   SpO2 100%   BMI 23.15 kg/m   Physical Exam awake/alert; chest- CTA bilat; heart- RRR; abd- soft,+BS,NT; ext- no edema  Imaging: Ct Chest W Contrast  Result Date: 06/22/2018 CLINICAL DATA:  37 year old female with history of abnormal Pap smear in October 2019, diagnosed with cervical cancer. Status post hysterectomy on 06/01/2018. Staging examination prior to chemotherapy and radiation therapy. EXAM: CT CHEST, ABDOMEN, AND PELVIS WITH CONTRAST TECHNIQUE: Multidetector CT imaging of the chest, abdomen and pelvis was performed following the standard protocol during bolus administration of intravenous contrast. CONTRAST:  165m OMNIPAQUE IOHEXOL 300 MG/ML  SOLN  COMPARISON:  CT the abdomen and pelvis 09/12/2009. FINDINGS: CT CHEST FINDINGS Cardiovascular: Heart size is normal. There is no significant pericardial fluid, thickening or pericardial calcification. No atherosclerotic calcifications noted in the thoracic aorta or the coronary arteries. Mediastinum/Nodes: No pathologically enlarged mediastinal or hilar lymph nodes. Esophagus is unremarkable in appearance. No axillary lymphadenopathy. Lungs/Pleura: There are 2 small pulmonary nodules in the lungs, largest of which measures 5 mm in the left upper lobe (axial image 45 of series 4). 3 mm subpleural nodule associated with the minor fissure (axial image 82 of series 4) is strongly favored to represent a subpleural lymph node. No other larger more suspicious appearing pulmonary nodules or masses are noted. No acute consolidative airspace disease. No pleural effusions. Musculoskeletal: There are no aggressive appearing lytic or blastic lesions noted in the visualized portions of the skeleton. 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 No other larger more suspicious appearing hepatic lesions are noted. No intra or extrahepatic biliary ductal dilatation. Gallbladder is normal in appearance. Pancreas: No pancreatic mass. No pancreatic ductal dilatation. No pancreatic or peripancreatic fluid or inflammatory changes. Spleen: Unremarkable. Adrenals/Urinary Tract: 4 mm in nonobstructive calculus in the upper pole collecting system of the right kidney. Kidneys and bilateral adrenal glands are otherwise normal. No hydroureteronephrosis. Urinary bladder is normal in appearance. Stomach/Bowel:  Normal appearance of the stomach. No pathologic dilatation of small bowel or colon. Normal appendix. Vascular/Lymphatic: No significant atherosclerotic disease, aneurysm or dissection noted in the abdominal or pelvic vasculature. No lymphadenopathy noted in the abdomen or pelvis. Reproductive: Status post total abdominal hysterectomy and bilateral oophoropexy. Other: No significant volume of ascites.  No pneumoperitoneum. Musculoskeletal: There are no aggressive appearing lytic or blastic lesions noted in the visualized portions of the skeleton. IMPRESSION: 1. Status post total abdominal hysterectomy. No definitive findings to strongly suggest metastatic disease to the chest, abdomen or pelvis. 2. 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 Further evaluation with nonemergent MRI of the abdomen with and without IV gadolinium is strongly recommended to provide definitive characterization of this lesion in the near future. 3. Two small pulmonary nodules measuring 5 mm or less in size, nonspecific but statistically likely benign. Attention on routine follow-up studies is recommended to ensure their stability. 4. 4 mm nonobstructive calculus in the upper pole collecting system of the right kidney. No ureteral stones or findings of urinary tract obstruction are noted at this time. Electronically Signed   By: Vinnie Langton M.D.   On: 06/22/2018 15:09   Ct Abdomen Pelvis W Contrast  Result Date: 06/22/2018 CLINICAL DATA:  37 year old female with history of abnormal Pap smear in October 2019, diagnosed with cervical cancer. Status post hysterectomy on 06/01/2018. Staging examination prior to chemotherapy and radiation therapy. EXAM: CT CHEST, ABDOMEN, AND PELVIS WITH CONTRAST TECHNIQUE: Multidetector CT imaging of the chest, abdomen and pelvis was performed following the standard protocol during bolus administration of intravenous contrast. CONTRAST:   143m OMNIPAQUE IOHEXOL 300 MG/ML  SOLN COMPARISON:  CT the abdomen and pelvis 09/12/2009. FINDINGS: CT CHEST FINDINGS Cardiovascular: Heart size is normal. There is no significant pericardial fluid, thickening or pericardial calcification. No atherosclerotic calcifications noted in the thoracic aorta or the coronary arteries. Mediastinum/Nodes: No pathologically enlarged mediastinal or hilar lymph nodes. Esophagus is unremarkable in appearance. No axillary lymphadenopathy. Lungs/Pleura: There are 2 small pulmonary nodules in the lungs, largest of which measures 5 mm in the left upper lobe (axial image 45 of series 4). 3 mm subpleural nodule associated with the minor fissure (axial image 82 of series 4) is strongly favored to represent a subpleural lymph node. No other larger more suspicious appearing pulmonary nodules or masses are noted. No acute consolidative airspace disease. No pleural effusions. Musculoskeletal: There are no aggressive appearing lytic or blastic lesions noted in the visualized portions of the skeleton. 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 No other larger more suspicious appearing hepatic lesions are noted. No intra or extrahepatic biliary ductal dilatation. Gallbladder is normal in appearance. Pancreas: No pancreatic mass. No pancreatic ductal dilatation. No pancreatic or peripancreatic fluid or inflammatory changes. Spleen: Unremarkable. Adrenals/Urinary Tract: 4 mm in nonobstructive calculus in the upper pole collecting system of the right kidney. Kidneys and bilateral adrenal glands are otherwise normal. No hydroureteronephrosis. Urinary bladder is  normal in appearance. Stomach/Bowel: Normal appearance of the stomach. No pathologic dilatation of small bowel or colon. Normal appendix. Vascular/Lymphatic: No significant atherosclerotic disease, aneurysm or dissection noted in the abdominal or pelvic vasculature. No lymphadenopathy noted in the abdomen or pelvis. Reproductive: Status post total abdominal hysterectomy and bilateral oophoropexy. Other: No significant volume of ascites.  No pneumoperitoneum. Musculoskeletal: There are no aggressive appearing lytic or blastic lesions noted in the visualized portions of the skeleton. IMPRESSION: 1. Status post total abdominal hysterectomy. No definitive findings to strongly suggest metastatic disease to the chest, abdomen or pelvis. 2. 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 Further evaluation with nonemergent MRI of the abdomen with and without IV gadolinium is strongly recommended to provide definitive characterization  of this lesion in the near future. 3. Two small pulmonary nodules measuring 5 mm or less in size, nonspecific but statistically likely benign. Attention on routine follow-up studies is recommended to ensure their stability. 4. 4 mm nonobstructive calculus in the upper pole collecting system of the right kidney. No ureteral stones or findings of urinary tract obstruction are noted at this time. Electronically Signed   By: Vinnie Langton M.D.   On: 06/22/2018 15:09   Nm Pet Image Initial (pi) Skull Base To Thigh  Result Date: 07/02/2018 CLINICAL DATA:  Initial treatment strategy for vaginal cancer. EXAM: NUCLEAR MEDICINE PET SKULL BASE TO THIGH TECHNIQUE: 7.8 mCi F-18 FDG was injected intravenously. Full-ring PET imaging was performed from the skull base to thigh after the radiotracer. CT data was obtained and used for attenuation correction and anatomic localization. Fasting blood glucose: 98 mg/dl COMPARISON:  CT scan 06/22/2018  FINDINGS: Mediastinal blood pool activity: SUV max 1.6 NECK: Symmetric intense activity identified in the oropharynx bilaterally. Left-sided cervical lymph node (22/3) measures 11 mm with SUV max = 4.4. 14 mm short axis right cervical lymph node (23/3) demonstrates SUV max = 3.8. Incidental CT findings: none CHEST: No hypermetabolic mediastinal or hilar nodes. No suspicious pulmonary nodules on the CT scan. Incidental CT findings: Tiny bilateral pulmonary nodules are evident, as seen on recent diagnostic chest CT. These are below threshold for resolution on PET imaging. No focal consolidation or pleural effusion. ABDOMEN/PELVIS: No abnormal hypermetabolic activity within the liver, pancreas, adrenal glands, or spleen. No hypermetabolic lymph nodes in the abdomen. 12 mm short axis right internal iliac lymph node (979/4) is hypermetabolic with SUV max = 80.1. 8 mm short axis right internal iliac lymph node (165/3) is also hypermetabolic with SUV max = 6.7. Uterus surgically absent with hypermetabolism identified in the vaginal cuff demonstrating SUV max = 7.2. Diffuse uptake identified in the right ovary, nonspecific. Incidental CT findings: noneTiny nonobstructing stone identified upper pole right kidney. SKELETON: No focal hypermetabolic activity to suggest skeletal metastasis. Incidental CT findings: No worrisome lytic or sclerotic osseous abnormality. IMPRESSION: 1. Hypermetabolic right internal iliac lymph nodes, consistent with metastatic disease. 2. Hypermetabolic cervical lymph nodes bilaterally. This would be atypical metastatic spread for the reported history of vaginal cancer. The patient does have diffuse, but symmetric hypermetabolic uptake in the nasopharynx and hypopharynx. Infectious/inflammatory etiology a consideration although neoplasm not excluded. 3. Hypermetabolic FDG accumulation in the right ovary, nonspecific with no gross mass lesion evident. Left ovary is high in the left pelvis with low  level FDG uptake. 4. Tiny bilateral pulmonary nodules as seen on previous diagnostic CT. These are below threshold size for reliable resolution on PET imaging. Electronically Signed   By: Misty Stanley M.D.   On: 07/02/2018 12:03    Labs:  CBC: Recent Labs    04/14/18 1233 05/24/18 1441 06/02/18 0450 07/12/18 1041  WBC 8.0 8.3 12.3* 6.3  HGB 14.4 14.7 12.7 14.8  HCT 44.5 46.6* 41.6 47.8*  PLT 273 278 255 308    COAGS: No results for input(s): INR, APTT in the last 8760 hours.  BMP: Recent Labs    04/14/18 1233 05/24/18 1441 06/02/18 0450  NA 140 139 139  K 4.1 4.3 3.8  CL 109 104 110  CO2 '22 27 23  ' GLUCOSE 90 89 103*  BUN '8 11 9  ' CALCIUM 9.3 9.5 8.5*  CREATININE 0.61 0.69 0.73  GFRNONAA >60 >60 >60  GFRAA >60 >60 >60    LIVER  FUNCTION TESTS: Recent Labs    04/14/18 1233  BILITOT 0.6  AST 20  ALT 19  ALKPHOS 73  PROT 7.5  ALBUMIN 4.4    TUMOR MARKERS: No results for input(s): AFPTM, CEA, CA199, CHROMGRNA in the last 8760 hours.  Assessment and Plan: 37 y.o. female with hx vaginal cancer, s/p robotic assisted lap total hysterectomy with BSO/upper vaginectomy/oophoropexy on 06/01/18. She presents today for port a cath placement for planned chemotherapy.Risks and benefits of image guided port-a-catheter placement was discussed with the patient/family including, but not limited to bleeding, infection, pneumothorax, or fibrin sheath development and need for additional procedures.  All of the patient's questions were answered, patient is agreeable to proceed. Consent signed and in chart.     Thank you for this interesting consult.  I greatly enjoyed meeting Kelly Mcclure and look forward to participating in their care.  A copy of this report was sent to the requesting provider on this date.  Electronically Signed: D. Rowe Robert, PA-C 07/12/2018, 11:03 AM   I spent a total of 25 minutes    in face to face in clinical consultation, greater than 50% of  which was counseling/coordinating care for port a cath placement

## 2018-07-12 NOTE — Telephone Encounter (Signed)
If patient calls back and schedule her a 6 month follow up with Dr. Krista Blue in 01/2019.   RN call patient to schedule her with Dr. Krista Blue but came on . Rn left vm stating to call back at [phone] to schedule appt with Dr. Krista Blue in 01/2019.

## 2018-07-12 NOTE — Discharge Instructions (Signed)
No EMLA cream on the skin glue. It will dissolve the skin glue.     Implanted Port Insertion, Care After This sheet gives you information about how to care for yourself after your procedure. Your health care provider may also give you more specific instructions. If you have problems or questions, contact your health care provider. What can I expect after the procedure? After your procedure, it is common to have:  Discomfort at the port insertion site.  Bruising on the skin over the port. This should improve over 3-4 days.  Follow these instructions at home: [name] care  After your port is placed, you will get a manufacturer's information card. The card has information about your port. Keep this card with you at all times.  Take care of the port as told by your health care provider. Ask your health care provider if you or a family member can get training for taking care of the port at home. A home health care nurse may also take care of the port.  Make sure to remember what type of port you have. Incision care  Follow instructions from your health care provider about how to take care of your port insertion site. Make sure you: ? Wash your hands with soap and water before you change your bandage (dressing). If soap and water are not available, use hand sanitizer. ? Change your dressing as told by your health care provider. ? Leave stitches (sutures), skin glue, or adhesive strips in place. These skin closures may need to stay in place for 2 weeks or longer. If adhesive strip edges start to loosen and curl up, you may trim the loose edges. Do not remove adhesive strips completely unless your health care provider tells you to do that.  Check your port insertion site every day for signs of infection. Check for: ? More redness, swelling, or pain. ? More fluid or blood. ? Warmth. ? Pus or a bad smell. General instructions  Do not take baths, swim, or use a hot tub until your health care  provider approves.  Do not lift anything that is heavier than 10 lb (4.5 kg) for a week, or as told by your health care provider.  Ask your health care provider when it is okay to: ? Return to work or school. ? Resume usual physical activities or sports.  Do not drive for 24 hours if you were given a medicine to help you relax (sedative).  Take over-the-counter and prescription medicines only as told by your health care provider.  Wear a medical alert bracelet in case of an emergency. This will tell any health care providers that you have a port.  Keep all follow-up visits as told by your health care provider. This is important. Contact a health care provider if:  You cannot flush your port with saline as directed, or you cannot draw blood from the port.  You have a fever or chills.  You have more redness, swelling, or pain around your port insertion site.  You have more fluid or blood coming from your port insertion site.  Your port insertion site feels warm to the touch.  You have pus or a bad smell coming from the port insertion site. Get help right away if:  You have chest pain or shortness of breath.  You have bleeding from your port that you cannot control. Summary  Take care of the port as told by your health care provider.  Change your dressing as told  by your health care provider.  Keep all follow-up visits as told by your health care provider. This information is not intended to replace advice given to you by your health care provider. Make sure you discuss any questions you have with your health care provider. Document Released: 05/11/2013 Document Revised: 06/11/2016 Document Reviewed: 06/11/2016 Elsevier Interactive Patient Education  2017 [place].    Moderate Conscious Sedation, Adult, Care After These instructions provide you with information about caring for yourself after your procedure. Your health care provider may also give you more specific  instructions. Your treatment has been planned according to current medical practices, but problems sometimes occur. Call your health care provider if you have any problems or questions after your procedure. What can I expect after the procedure? After your procedure, it is common:  To feel sleepy for several hours.  To feel clumsy and have poor balance for several hours.  To have poor judgment for several hours.  To vomit if you eat too soon.  Follow these instructions at home: For at least 24 hours after the procedure:   Do not: ? Participate in activities where you could fall or become injured. ? Drive. ? Use heavy machinery. ? Drink alcohol. ? Take sleeping pills or medicines that cause drowsiness. ? Make important decisions or sign legal documents. ? Take care of children on your own.  Rest. Eating and drinking  Follow the diet recommended by your health care provider.  If you vomit: ? Drink water, juice, or soup when you can drink without vomiting. ? Make sure you have little or no nausea before eating solid foods. General instructions  Have a responsible adult stay with you until you are awake and alert.  Take over-the-counter and prescription medicines only as told by your health care provider.  If you smoke, do not smoke without supervision.  Keep all follow-up visits as told by your health care provider. This is important. Contact a health care provider if:  You keep feeling nauseous or you keep vomiting.  You feel light-headed.  You develop a rash.  You have a fever. Get help right away if:  You have trouble breathing. This information is not intended to replace advice given to you by your health care provider. Make sure you discuss any questions you have with your health care provider. Document Released: 05/11/2013 Document Revised: 12/24/2015 Document Reviewed: 11/10/2015 Elsevier Interactive Patient Education  [employer].

## 2018-07-12 NOTE — Telephone Encounter (Signed)
Appointment Request From: Loralie Champagne    With Provider: Marcial Pacas, MD [[facility]]    Preferred Date Range: 01/07/2019 - 01/14/2019    Preferred Times: Any time    Reason for visit: Request an Appointment    Comments:  This is a 6 month follow up per Dr. Krista Blue. I was not able to schedule at my last appointment because the system was down.

## 2018-07-13 ENCOUNTER — Inpatient Hospital Stay: Payer: 59

## 2018-07-14 ENCOUNTER — Ambulatory Visit
Admission: RE | Admit: 2018-07-14 | Discharge: 2018-07-14 | Disposition: A | Discharge status: Home / Self Care | Payer: 59 | Source: Ambulatory Visit | Attending: Radiation Oncology | Admitting: Radiation Oncology

## 2018-07-14 ENCOUNTER — Inpatient Hospital Stay: Payer: 59

## 2018-07-14 DIAGNOSIS — C52 Malignant neoplasm of vagina: Secondary | ICD-10-CM | POA: Diagnosis not present

## 2018-07-14 LAB — CBC WITH DIFFERENTIAL/PLATELET
Abs Immature Granulocytes: 0.02 10*3/uL (ref 0.00–0.07)
BASOS ABS: 0 10*3/uL (ref 0.0–0.1)
Basophils Relative: 1 %
Eosinophils Absolute: 0.1 10*3/uL (ref 0.0–0.5)
Eosinophils Relative: 2 %
HCT: 43.5 % (ref 36.0–46.0)
Hemoglobin: 13.7 g/dL (ref 12.0–15.0)
Immature Granulocytes: 0 %
Lymphocytes Relative: 49 %
Lymphs Abs: 3.3 10*3/uL (ref 0.7–4.0)
MCH: 25.7 pg — ABNORMAL LOW (ref 26.0–34.0)
MCHC: 31.5 g/dL (ref 30.0–36.0)
MCV: 81.6 fL (ref 80.0–100.0)
Monocytes Absolute: 0.6 10*3/uL (ref 0.1–1.0)
Monocytes Relative: 9 %
Neutro Abs: 2.6 10*3/uL (ref 1.7–7.7)
Neutrophils Relative %: 39 %
Platelets: 253 10*3/uL (ref 150–400)
RBC: 5.33 MIL/uL — ABNORMAL HIGH (ref 3.87–5.11)
RDW: 14 % (ref 11.5–15.5)
WBC: 6.6 10*3/uL (ref 4.0–10.5)
nRBC: 0 % (ref 0.0–0.2)

## 2018-07-14 LAB — COMPREHENSIVE METABOLIC PANEL
ALT: 16 U/L (ref 0–44)
AST: 18 U/L (ref 15–41)
Albumin: 4.4 g/dL (ref 3.5–5.0)
Alkaline Phosphatase: 114 U/L (ref 38–126)
Anion gap: 8 (ref 5–15)
BUN: 9 mg/dL (ref 6–20)
CO2: 24 mmol/L (ref 22–32)
Calcium: 9.4 mg/dL (ref 8.9–10.3)
Chloride: 109 mmol/L (ref 98–111)
Creatinine, Ser: 0.8 mg/dL (ref 0.44–1.00)
GFR calc non Af Amer: >60 mL/min (ref >60)
Glucose, Bld: 97 mg/dL (ref 70–99)
Potassium: 4.2 mmol/L (ref 3.5–5.1)
Sodium: 141 mmol/L (ref 135–145)
Total Bilirubin: 0.6 mg/dL (ref 0.3–1.2)
Total Protein: 7.6 g/dL (ref 6.5–8.1)

## 2018-07-14 LAB — MAGNESIUM: Magnesium: 1.8 mg/dL (ref 1.7–2.4)

## 2018-07-14 NOTE — Progress Notes (Signed)
  Radiation Oncology         [phone]) [phone] ________________________________  Name: Kelly Mcclure MRN: [id number]  Date: 07/14/2018  DOB: February 03, 1981  Simulation Verification Note    ICD-10-CM   1. 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 The clinical setup was verified. Then port films were obtained and uploaded to the radiation oncology medical record software.  The treatment beams were carefully compared against the planned radiation fields. The position location and shape of the radiation fields was reviewed. They targeted volume of tissue appears to be appropriately covered by the radiation beams. Organs at risk appear to be excluded as planned.  Based on my personal review, I approved the simulation verification. The patient's treatment will proceed as planned.  -----------------------------------  Blair Promise, PhD, MD  This document serves as a record of services personally performed by Gery Pray, MD. It was created on his behalf by Mary-Margaret Loma Messing, a trained medical scribe. The creation of this record is based on the scribe's personal observations and the provider's statements to them. This document has been checked and approved by the attending provider.

## 2018-07-15 ENCOUNTER — Ambulatory Visit
Admission: RE | Admit: 2018-07-15 | Discharge: 2018-07-15 | Disposition: A | Discharge status: Home / Self Care | Payer: 59 | Source: Ambulatory Visit | Attending: Radiation Oncology | Admitting: Radiation Oncology

## 2018-07-15 DIAGNOSIS — C52 Malignant neoplasm of vagina: Secondary | ICD-10-CM | POA: Diagnosis not present

## 2018-07-16 ENCOUNTER — Inpatient Hospital Stay: Payer: 59

## 2018-07-16 ENCOUNTER — Ambulatory Visit
Admission: RE | Admit: 2018-07-16 | Discharge: 2018-07-16 | Disposition: A | Discharge status: Home / Self Care | Payer: 59 | Source: Ambulatory Visit | Attending: Radiation Oncology | Admitting: Radiation Oncology

## 2018-07-16 ENCOUNTER — Encounter: Payer: Self-pay | Admitting: Hematology and Oncology

## 2018-07-16 VITALS — BP 116/76 | HR 99 | Temp 98.4°F | Resp 16

## 2018-07-16 DIAGNOSIS — C52 Malignant neoplasm of vagina: Secondary | ICD-10-CM | POA: Diagnosis not present

## 2018-07-16 MED ORDER — HEPARIN SOD (PORK) LOCK FLUSH 100 UNIT/ML IV SOLN
500.0000 [IU] | Freq: Once | INTRAVENOUS | Status: AC | PRN
  Administered 2018-07-16: 500 [IU]
  Filled 2018-07-16: qty 5

## 2018-07-16 MED ORDER — SODIUM CHLORIDE 0.9 % IV SOLN
40.0000 mg/m2 | Freq: Once | INTRAVENOUS | Status: AC
  Administered 2018-07-16: 72 mg via INTRAVENOUS
  Filled 2018-07-16: qty 72

## 2018-07-16 MED ORDER — PALONOSETRON HCL INJECTION 0.25 MG/5ML
INTRAVENOUS | Status: AC
  Filled 2018-07-16: qty 5

## 2018-07-16 MED ORDER — SODIUM CHLORIDE 0.9% FLUSH
10.0000 mL | INTRAVENOUS | Status: DC | PRN
  Administered 2018-07-16: 10 mL
  Filled 2018-07-16: qty 10

## 2018-07-16 MED ORDER — POTASSIUM CHLORIDE 2 MEQ/ML IV SOLN
Freq: Once | INTRAVENOUS | Status: AC
  Administered 2018-07-16: 09:00:00 via INTRAVENOUS
  Filled 2018-07-16: qty 10

## 2018-07-16 MED ORDER — PALONOSETRON HCL INJECTION 0.25 MG/5ML
0.2500 mg | Freq: Once | INTRAVENOUS | Status: AC
  Administered 2018-07-16: 0.25 mg via INTRAVENOUS

## 2018-07-16 MED ORDER — SODIUM CHLORIDE 0.9 % IV SOLN
Freq: Once | INTRAVENOUS | Status: AC
  Administered 2018-07-16: 11:00:00 via INTRAVENOUS
  Filled 2018-07-16: qty 5

## 2018-07-16 MED ORDER — SODIUM CHLORIDE 0.9 % IV SOLN
Freq: Once | INTRAVENOUS | Status: AC
  Administered 2018-07-16: 08:00:00 via INTRAVENOUS
  Filled 2018-07-16: qty 250

## 2018-07-16 NOTE — Progress Notes (Signed)
Went to infusion to meet with patient to introduce myself as Arboriculturist and to offer available resources.  Discussed one-time $1000 Radio broadcast assistant to assist with personal expenses while going through treatment. Briefly went over types of expenses it covers and what is needed to apply(recent paystub).  Gave her my card for any additional financial questions or concerns.

## 2018-07-16 NOTE — Patient Instructions (Signed)
[facility] Discharge Instructions for Patients Receiving Chemotherapy  Today you received the following chemotherapy agents: cisplatin  To help prevent nausea and vomiting after your treatment, we encourage you to take your nausea medication as directed by your physician.   If you develop nausea and vomiting that is not controlled by your nausea medication, call the clinic.   BELOW ARE SYMPTOMS THAT SHOULD BE REPORTED IMMEDIATELY:  *FEVER GREATER THAN 100.5 F  *CHILLS WITH OR WITHOUT FEVER  NAUSEA AND VOMITING THAT IS NOT CONTROLLED WITH YOUR NAUSEA MEDICATION  *UNUSUAL SHORTNESS OF BREATH  *UNUSUAL BRUISING OR BLEEDING  TENDERNESS IN MOUTH AND THROAT WITH OR WITHOUT PRESENCE OF ULCERS  *URINARY PROBLEMS  *BOWEL PROBLEMS  UNUSUAL RASH Items with * indicate a potential emergency and should be followed up as soon as possible.  Feel free to call the clinic should you have any questions or concerns. The clinic phone number is [phone].  Please show the [place] at check-in to the Emergency Department and triage nurse.  Cisplatin injection What is this medicine? CISPLATIN (SIS pla tin) is a chemotherapy drug. It targets fast dividing cells, like cancer cells, and causes these cells to die. This medicine is used to treat many types of cancer like bladder, ovarian, and testicular cancers. This medicine may be used for other purposes; ask your health care provider or pharmacist if you have questions. COMMON BRAND NAME(S): Platinol, Platinol -AQ What should I tell my health care provider before I take this medicine? They need to know if you have any of these conditions: -blood disorders -hearing problems -kidney disease -recent or ongoing radiation therapy -an unusual or allergic reaction to cisplatin, carboplatin, other chemotherapy, other medicines, foods, dyes, or preservatives -pregnant or trying to get pregnant -breast-feeding How should I use  this medicine? This drug is given as an infusion into a vein. It is administered in a hospital or clinic by a specially trained health care professional. Talk to your pediatrician regarding the use of this medicine in children. Special care may be needed. Overdosage: If you think you have taken too much of this medicine contact a poison control center or emergency room at once. NOTE: This medicine is only for you. Do not share this medicine with others. What if I miss a dose? It is important not to miss a dose. Call your doctor or health care professional if you are unable to keep an appointment. What may interact with this medicine? -dofetilide -foscarnet -medicines for seizures -medicines to increase blood counts like filgrastim, pegfilgrastim, sargramostim -probenecid -pyridoxine used with altretamine -rituximab -some antibiotics like amikacin, gentamicin, neomycin, polymyxin B, streptomycin, tobramycin -sulfinpyrazone -vaccines -zalcitabine Talk to your doctor or health care professional before taking any of these medicines: -acetaminophen -aspirin -ibuprofen -ketoprofen -naproxen This list may not describe all possible interactions. Give your health care provider a list of all the medicines, herbs, non-prescription drugs, or dietary supplements you use. Also tell them if you smoke, drink alcohol, or use illegal drugs. Some items may interact with your medicine. What should I watch for while using this medicine? Your condition will be monitored carefully while you are receiving this medicine. You will need important blood work done while you are taking this medicine. This drug may make you feel generally unwell. This is not uncommon, as chemotherapy can affect healthy cells as well as cancer cells. Report any side effects. Continue your course of treatment even though you feel ill unless your doctor tells  you to stop. In some cases, you may be given additional medicines to help with  side effects. Follow all directions for their use. Call your doctor or health care professional for advice if you get a fever, chills or sore throat, or other symptoms of a cold or flu. Do not treat yourself. This drug decreases your body's ability to fight infections. Try to avoid being around people who are sick. This medicine may increase your risk to bruise or bleed. Call your doctor or health care professional if you notice any unusual bleeding. Be careful brushing and flossing your teeth or using a toothpick because you may get an infection or bleed more easily. If you have any dental work done, tell your dentist you are receiving this medicine. Avoid taking products that contain aspirin, acetaminophen, ibuprofen, naproxen, or ketoprofen unless instructed by your doctor. These medicines may hide a fever. Do not become pregnant while taking this medicine. Women should inform their doctor if they wish to become pregnant or think they might be pregnant. There is a potential for serious side effects to an unborn child. Talk to your health care professional or pharmacist for more information. Do not breast-feed an infant while taking this medicine. Drink fluids as directed while you are taking this medicine. This will help protect your kidneys. Call your doctor or health care professional if you get diarrhea. Do not treat yourself. What side effects may I notice from receiving this medicine? Side effects that you should report to your doctor or health care professional as soon as possible: -allergic reactions like skin rash, itching or hives, swelling of the face, lips, or tongue -signs of infection - fever or chills, cough, sore throat, pain or difficulty passing urine -signs of decreased platelets or bleeding - bruising, pinpoint red spots on the skin, black, tarry stools, nosebleeds -signs of decreased red blood cells - unusually weak or tired, fainting spells, lightheadedness -breathing  problems -changes in hearing -gout pain -low blood counts - This drug may decrease the number of white blood cells, red blood cells and platelets. You may be at increased risk for infections and bleeding. -nausea and vomiting -pain, swelling, redness or irritation at the injection site -pain, tingling, numbness in the hands or feet -problems with balance, movement -trouble passing urine or change in the amount of urine Side effects that usually do not require medical attention (report to your doctor or health care professional if they continue or are bothersome): -changes in vision -loss of appetite -metallic taste in the mouth or changes in taste This list may not describe all possible side effects. Call your doctor for medical advice about side effects. You may report side effects to FDA at 1-800-FDA-1088. Where should I keep my medicine? This drug is given in a hospital or clinic and will not be stored at home. NOTE: This sheet is a summary. It may not cover all possible information. If you have questions about this medicine, talk to your doctor, pharmacist, or health care provider.  2018 Elsevier/Gold Standard (2007-10-26 14:40:54)

## 2018-07-19 ENCOUNTER — Ambulatory Visit
Admission: RE | Admit: 2018-07-19 | Discharge: 2018-07-19 | Disposition: A | Discharge status: Home / Self Care | Payer: 59 | Source: Ambulatory Visit | Attending: Radiation Oncology | Admitting: Radiation Oncology

## 2018-07-19 ENCOUNTER — Encounter: Payer: Self-pay | Admitting: Hematology and Oncology

## 2018-07-19 ENCOUNTER — Telehealth: Payer: Self-pay

## 2018-07-19 DIAGNOSIS — C52 Malignant neoplasm of vagina: Secondary | ICD-10-CM | POA: Diagnosis not present

## 2018-07-19 NOTE — Telephone Encounter (Signed)
Called for follow up after first treatment on Friday. She is is good today. 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  Per Dr. Rosalyn Charters message regarding her daughter getting hand, foot and mouth disease. There is nothing she can do except wash her hands frequently. Instructed to call the office if needed. She verbalized understanding.

## 2018-07-19 NOTE — Telephone Encounter (Signed)
-----   Message from Teodoro Spray, RN sent at 07/16/2018  3:33 PM EST ----- Regarding: Dr. Ilean China time chemo Dr. Alvy Bimler pt. First time chemo--cisplatin. Thank you.

## 2018-07-20 ENCOUNTER — Ambulatory Visit
Admission: RE | Admit: 2018-07-20 | Discharge: 2018-07-20 | Disposition: A | Discharge status: Home / Self Care | Payer: 59 | Source: Ambulatory Visit | Attending: Radiation Oncology | Admitting: Radiation Oncology

## 2018-07-20 DIAGNOSIS — C52 Malignant neoplasm of vagina: Secondary | ICD-10-CM | POA: Diagnosis not present

## 2018-07-21 ENCOUNTER — Inpatient Hospital Stay: Payer: 59

## 2018-07-21 ENCOUNTER — Ambulatory Visit
Admission: RE | Admit: 2018-07-21 | Discharge: 2018-07-21 | Disposition: A | Discharge status: Home / Self Care | Payer: 59 | Source: Ambulatory Visit | Attending: Radiation Oncology | Admitting: Radiation Oncology

## 2018-07-21 DIAGNOSIS — C52 Malignant neoplasm of vagina: Secondary | ICD-10-CM

## 2018-07-21 LAB — COMPREHENSIVE METABOLIC PANEL
ALBUMIN: 4.1 g/dL (ref 3.5–5.0)
ALT: 29 U/L (ref 0–44)
AST: 18 U/L (ref 15–41)
Alkaline Phosphatase: 113 U/L (ref 38–126)
Anion gap: 12 (ref 5–15)
BUN: 7 mg/dL (ref 6–20)
CHLORIDE: 100 mmol/L (ref 98–111)
CO2: 26 mmol/L (ref 22–32)
Calcium: 9.1 mg/dL (ref 8.9–10.3)
Creatinine, Ser: 0.74 mg/dL (ref 0.44–1.00)
GFR calc Af Amer: >60 mL/min (ref >60)
GFR calc non Af Amer: >60 mL/min (ref >60)
Glucose, Bld: 94 mg/dL (ref 70–99)
POTASSIUM: 3.6 mmol/L (ref 3.5–5.1)
Sodium: 138 mmol/L (ref 135–145)
Total Bilirubin: 0.6 mg/dL (ref 0.3–1.2)
Total Protein: 7.3 g/dL (ref 6.5–8.1)

## 2018-07-21 LAB — CBC WITH DIFFERENTIAL/PLATELET
Abs Immature Granulocytes: 0.03 10*3/uL (ref 0.00–0.07)
Basophils Absolute: 0 10*3/uL (ref 0.0–0.1)
Basophils Relative: 1 %
EOS ABS: 0.2 10*3/uL (ref 0.0–0.5)
Eosinophils Relative: 5 %
HCT: 41.3 % (ref 36.0–46.0)
Hemoglobin: 13.2 g/dL (ref 12.0–15.0)
Immature Granulocytes: 1 %
Lymphocytes Relative: 38 %
Lymphs Abs: 1.5 10*3/uL (ref 0.7–4.0)
MCH: 25.7 pg — ABNORMAL LOW (ref 26.0–34.0)
MCHC: 32 g/dL (ref 30.0–36.0)
MCV: 80.5 fL (ref 80.0–100.0)
Monocytes Absolute: 0.4 10*3/uL (ref 0.1–1.0)
Monocytes Relative: 9 %
Neutro Abs: 1.9 10*3/uL (ref 1.7–7.7)
Neutrophils Relative %: 46 %
Platelets: 220 10*3/uL (ref 150–400)
RBC: 5.13 MIL/uL — ABNORMAL HIGH (ref 3.87–5.11)
RDW: 13.3 % (ref 11.5–15.5)
WBC: 4.1 10*3/uL (ref 4.0–10.5)
nRBC: 0 % (ref 0.0–0.2)

## 2018-07-21 LAB — MAGNESIUM: Magnesium: 1.8 mg/dL (ref 1.7–2.4)

## 2018-07-21 MED ORDER — SODIUM CHLORIDE 0.9% FLUSH
10.0000 mL | Freq: Once | INTRAVENOUS | Status: AC
  Administered 2018-07-21: 10 mL
  Filled 2018-07-21: qty 10

## 2018-07-21 MED ORDER — HEPARIN SOD (PORK) LOCK FLUSH 100 UNIT/ML IV SOLN
250.0000 [IU] | Freq: Once | INTRAVENOUS | Status: AC
  Administered 2018-07-21: 250 [IU]
  Filled 2018-07-21: qty 5

## 2018-07-22 ENCOUNTER — Inpatient Hospital Stay (HOSPITAL_BASED_OUTPATIENT_CLINIC_OR_DEPARTMENT_OTHER): Payer: 59 | Admitting: Hematology and Oncology

## 2018-07-22 ENCOUNTER — Ambulatory Visit
Admission: RE | Admit: 2018-07-22 | Discharge: 2018-07-22 | Disposition: A | Discharge status: Home / Self Care | Payer: 59 | Source: Ambulatory Visit | Attending: Radiation Oncology | Admitting: Radiation Oncology

## 2018-07-22 ENCOUNTER — Encounter: Payer: Self-pay | Admitting: Hematology and Oncology

## 2018-07-22 DIAGNOSIS — C52 Malignant neoplasm of vagina: Secondary | ICD-10-CM | POA: Diagnosis not present

## 2018-07-22 DIAGNOSIS — R5383 Other fatigue: Secondary | ICD-10-CM | POA: Diagnosis not present

## 2018-07-22 DIAGNOSIS — R197 Diarrhea, unspecified: Secondary | ICD-10-CM | POA: Diagnosis not present

## 2018-07-22 NOTE — Progress Notes (Signed)
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[facility]) So far, she tolerated treatment well with minor side effects such as diarrhea and fatigue along with altered taste sensation We will continue weekly follow-up for toxicity review and proceed with treatment as scheduled I recommend consideration to be off work while on treatment  Other fatigue She had recent excessive fatigue due to side effects of treatment I gave her letter to restrict the number of hours of work to 4 hours or less as tolerated  Diarrhea She has severe diarrhea due to side effects of treatment I recommend Imodium and increase fluid hydration as tolerated.   No orders of the defined types were placed in this encounter.   INTERVAL HISTORY: Please see below for problem oriented charting. She returns to be seen prior to cycle 2 of treatment Since treatment has started, she complained of excessive fatigue and frequent diarrhea, watery 4-3 times per day Denies recent infection, fever or chills No nausea She felt somewhat dehydrated at times.  SUMMARY OF ONCOLOGIC HISTORY:   Vaginal cancer ([place])   03/01/2013 Surgery    OPERATIVE REPORT  PREOPERATIVE DIAGNOSIS: High-grade dysplasia on Pap smear  POSTOPERATIVE DIAGNOSIS: Same  PROCEDURE: Exam under anesthesia, endocervical curettage, vaginal biopsies  SURGEON: Marti Sleigh, M.D SURGICAL FINDINGS: Examination under anesthesia revealed a cervix which was flush with the vaginal fornices. The cervical canal was stenotic but once entered and dilated the uterus sounded approximate 7 cm anteriorly. Apply Lugol solution to the cervix showed no lesions. There were 2 areas of non-uptake of Lugol's in the posterior vagina which were biopsied    11/09/2016 Surgery    Preoperative diagnosis: Short cervix S/P cervical conization for  microinvasive cervical cancer, intrauterine pregnancy at 11 weeks  Postoperative diagnosis: Short cervix S/P cervical conization for microinvasive cervical cancer, intrauterine pregnancy at 11 weeks  Procedure: Laparoscopy, transabdominal cervical isthmic cerclage x 2, intraoperative ultrasound guidance  Anesthesia: Gen. Endotracheal  Surgeon:Tamer Kerin Perna, MD Findings: On exam under anesthesia the cervix was closed and deviated to the left. On laparoscopy the liver, gallbladder, and appendix were normal.  The uterus was gravid and retroverted and retroflexed, both tubes and ovaries appeared normal.  Intraoperative ultrasound transabdominally and transvaginally showed a singleton intrauterine pregnancy at length consistent with 11 weeks. There was fetal cardiac activity and movements.     01/19/2018 Pathology Results    Cervical biopsy showed high grade squamous intraepithelial with severe dysplasia    02/09/2018 Pathology Results    Cervix, biopsy - SUPERFICIAL FRAGMENTS OF CERVICAL MUCOSA SHOWING AT LEAST HIGH GRADE SQUAMOUS INTRAEPITHELIAL LESION (HSIL, CIN-III). - DEFINITIVE INVASION IS NOT SEEN BUT CANNOT BE ENTIRELY RULED OUT.    02/18/2018 Pathology Results    1. Cervix, cone - BENIGN SQUAMOUS AND ENDOCERVICAL MUCOSA, SEE COMMENT. - NO DYSPLASIA OR MALIGNANCY. 2. Endocervix, curettage - BLOOD. - NO MUCOSA PRESENT. Microscopic Comment 1. p16 is negative in the squamous epithelium    02/18/2018 Surgery    Preop Diagnosis: CIN III  Postoperative Diagnosis: same  Surgery: cold knife conization of cervix   Surgeons:  Donaciano Eva, MD Pathology: cervical cone with marking stitch at 12 o'clock, post cone ECC  Operative findings: no grossly visible exophytic lesions on ectocervix. Cervix flush with vagina.     03/11/2018 Pathology Results    Vagina, biopsy, posterior upper vaginal wall - HIGH GRADE SQUAMOUS DYSPLASIA, SEE  COMMENT. Microscopic  Comment There are detached squamous fragments with high grade dysplastic features. Several of the fragments have a minimal amount of attached stroma which is inflamed and focally fibrotic. While an invasive component cannot be ruled out, the limited stroma present hampers evaluate for invasion    04/27/2018 Pathology Results    Vagina, biopsy, posterior - SUPERFICIAL FRAGMENTS OF EXTENSIVE SQUAMOUS CELL CARCINOMA IN SITU. - SEE NOTE. Diagnosis Note Though focally suspicious, definitive evidence of invasive carcinoma is not seen in the submitted biopsies. The submitted fragments are superficial in nature and a deeper, more severe process cannot be ruled out. Dr Tresa Moore has reviewed this case and concurs with the above interpretation.    06/01/2018 Pathology Results    Uterus +/- tubes/ovaries, neoplastic, upper 1/3 of vagina - INVASIVE MODERATELY DIFFERENTIATED SQUAMOUS CELL CARCINOMA, 3.0 CM, INVOLVING POSTERIOR WALL OF UPPER THIRD OF VAGINA. - CARCINOMA INVOLVES THE CAUTERIZED DEEP RESECTION MARGIN; MUCOSAL MARGINS ARE NOT INVOLVED. - LYMPHOVASCULAR INVASION IS PRESENT. - UTERUS WITH BENIGN PROLIFERATIVE ENDOMETRIUM. - BENIGN UNREMARKABLE CERVIX. - BENIGN UNREMARKABLE BILATERAL FALLOPIAN TUBE. - SEE ONCOLOGY TABLE. - SEE NOTE. Microscopic Comment VAGINA: Resection Procedure: Partial vaginectomy with total hysterectomy and bilateral salpingectomy. Tumor Site: Posterior wall of upper third of vagina. Tumor Size: 3.0 cm. Histologic Type: Squamous cell carcinoma. Histologic Grade: G2: Moderately differentiated. Other Tissue/ Organ: Uterus and bilateral fallopian tubes are not involved by carcinoma. Margins: Deep cauterized margin is positive for carcinoma. Lymphovascular Invasion: Present. Regional Lymph Nodes: No lymph nodes submitted or found. Number of Lymph Nodes Examined: 0. Pathologic Stage Classification (pTNM, AJCC 8th Edition): pT1b, pNX. Representative Tumor Block: 1C.  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 Cerclage (merseline) in place with knot anteriorally. Bladder flap adhesions. 3x3cm discoid raised erythematous lesion on posterior upper wall of vagina with close proximity to cervix. No visible lesion remaining in vagina at completion of procedure.     06/22/2018 Imaging    Ct imaging 1. Status post total abdominal hysterectomy. No definitive findings to strongly suggest metastatic disease to the chest, abdomen or pelvis. 2. 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 Further evaluation with nonemergent MRI of the abdomen with and without IV gadolinium is strongly recommended to provide definitive characterization of this lesion in the near future. 3. Two small pulmonary nodules measuring 5 mm or less in size, nonspecific but statistically likely benign. Attention on routine follow-up studies is recommended to ensure their stability. 4. 4 mm nonobstructive calculus in the upper pole collecting system of the right kidney. No ureteral stones or findings of urinary tract obstruction are noted at this time    07/02/2018 PET scan    1. Hypermetabolic right internal iliac lymph nodes, consistent with metastatic disease. 2. Hypermetabolic cervical lymph nodes bilaterally. This would be atypical metastatic spread for the reported history of vaginal cancer. The patient does have diffuse, but symmetric hypermetabolic uptake in the nasopharynx and hypopharynx. Infectious/inflammatory etiology a  consideration although neoplasm not excluded. 3. Hypermetabolic FDG accumulation in the right ovary, nonspecific with no gross mass lesion evident. Left ovary is high  in the left pelvis with low level FDG uptake. 4. Tiny bilateral pulmonary nodules as seen on previous diagnostic CT. These are below threshold size for reliable resolution on PET imaging.     07/08/2018 Cancer Staging    Staging form: Vagina, AJCC 7th Edition - Pathologic: Stage III (T1, N1, cM0) - Signed by Heath Lark, MD on 07/08/2018    07/12/2018 Procedure    Technically successful right IJ power-injectable port catheter placement. Ready for routine use.    07/16/2018 -  Chemotherapy    The patient had palonosetron (ALOXI) injection 0.25 mg, 0.25 mg, Intravenous,  Once, 1 of 6 cycles  Administration: 0.25 mg (07/16/2018)    CISplatin (PLATINOL) 72 mg in sodium chloride 0.9 % 250 mL chemo infusion, 40 mg/m2 = 72 mg, Intravenous,  Once, 1 of 6 cycles  Administration: 72 mg (07/16/2018)    fosaprepitant (EMEND) 150 mg, dexamethasone (DECADRON) 12 mg in sodium chloride 0.9 % 145 mL IVPB, , Intravenous,  Once, 1 of 6 cycles  Administration:  (07/16/2018)  for chemotherapy treatment.  She received weekly cisplatin     REVIEW OF SYSTEMS:   Constitutional: Denies fevers, chills or abnormal weight loss Eyes: Denies blurriness of vision Ears, nose, mouth, throat, and face: Denies mucositis or sore throat Respiratory: Denies cough, dyspnea or wheezes Cardiovascular: Denies palpitation, chest discomfort or lower extremity swelling Skin: Denies abnormal skin rashes Lymphatics: Denies new lymphadenopathy or easy bruising Neurological:Denies numbness, tingling or new weaknesses Behavioral/Psych: Mood is stable, no new changes  All other systems were reviewed with the patient and are negative.  I have reviewed the past medical history, past surgical history, social history and family history with the patient and they are  unchanged from previous note.  ALLERGIES:  is allergic to sulfa antibiotics; adhesive [tape]; other; relpax [eletriptan hydrobromide]; and ultram [tramadol].  MEDICATIONS:  Current Outpatient Medications  Medication Sig Dispense Refill  . albuterol (PROVENTIL HFA;VENTOLIN HFA) 108 (90 Base) MCG/ACT inhaler Inhale 2 puffs into the lungs every 6 (six) hours as needed for wheezing or shortness of breath. 1 Inhaler 1  . ALPRAZolam (XANAX) 0.5 MG tablet Take 1 tablet (0.5 mg total) by mouth at bedtime as needed for anxiety. 30 tablet 3  . butalbital-acetaminophen-caffeine (FIORICET, ESGIC) 50-325-40 MG tablet Take 1-2 tablets by mouth every 4 (four) hours as needed for migraine.     Marland Kitchen ibuprofen (ADVIL,MOTRIN) 800 MG tablet Take 1 tablet (800 mg total) by mouth every 8 (eight) hours as needed. 30 tablet 0  . LamoTRIgine (LAMICTAL XR) 200 MG TB24 24 hour tablet Take 2 tablets (400 mg total) by mouth 2 (two) times daily. (Patient taking differently: Take 400 mg by mouth 2 (two) times daily. Takes 1000 and 2200) 360 tablet 4  . LamoTRIgine 100 MG TB24 24 hour tablet Take 1 tablet (100 mg total) by mouth at bedtime. 30 tablet 11  . levETIRAcetam (KEPPRA) 750 MG tablet Take 2 tablets (1,500 mg total) by mouth 2 (two) times daily. 360 tablet 4  . lidocaine-prilocaine (EMLA) cream Apply to affected area once 30 g 3  . loratadine (CLARITIN) 10 MG tablet Take 10 mg by mouth every morning.     . montelukast (SINGULAIR) 10 MG tablet TAKE 1 TABLET BY MOUTH EVERYDAY AT BEDTIME 30 tablet 0  . Multiple Vitamins-Minerals (CENTRUM WOMEN) TABS Take 1 tablet by mouth daily.    . ondansetron (ZOFRAN) 8 MG tablet Take 1 tablet (8 mg total) by mouth 2 (two) times daily as  needed. Start on the third day after chemotherapy. 30 tablet 1  . prochlorperazine (COMPAZINE) 10 MG tablet Take 1 tablet (10 mg total) by mouth every 6 (six) hours as needed (Nausea or vomiting). 30 tablet 1  . SYNTHROID 150 MCG tablet Take 1 tablet (150  mcg total) by mouth daily before breakfast. 90 tablet 3   No current facility-administered medications for this visit.     PHYSICAL EXAMINATION: ECOG PERFORMANCE STATUS: 1 - Symptomatic but completely ambulatory  Vitals:   07/22/18 1049  BP: 95/71  Pulse: 97  Resp: 18  Temp: 97.7 F (36.5 C)  SpO2: 98%   Filed Weights   07/22/18 1049  Weight: 157 lb (71.2 kg)    GENERAL:alert, no distress and comfortable SKIN: skin color, texture, turgor are normal, no rashes or significant lesions EYES: normal, Conjunctiva are pink and non-injected, sclera clear OROPHARYNX:no exudate, no erythema and lips, buccal mucosa, and tongue normal  NECK: supple, thyroid normal size, non-tender, without nodularity LYMPH:  no palpable lymphadenopathy in the cervical, axillary or inguinal LUNGS: clear to auscultation and percussion with normal breathing effort HEART: regular rate & rhythm and no murmurs and no lower extremity edema ABDOMEN:abdomen soft, non-tender and normal bowel sounds Musculoskeletal:no cyanosis of digits and no clubbing  NEURO: alert & oriented x 3 with fluent speech, no focal motor/sensory deficits  LABORATORY DATA:  I have reviewed the data as listed    Component Value Date/Time   NA 138 07/21/2018 0931   K 3.6 07/21/2018 0931   CL 100 07/21/2018 0931   CO2 26 07/21/2018 0931   GLUCOSE 94 07/21/2018 0931   BUN 7 07/21/2018 0931   CREATININE 0.74 07/21/2018 0931   CALCIUM 9.1 07/21/2018 0931   PROT 7.3 07/21/2018 0931   ALBUMIN 4.1 07/21/2018 0931   AST 18 07/21/2018 0931   ALT 29 07/21/2018 0931   ALKPHOS 113 07/21/2018 0931   BILITOT 0.6 07/21/2018 0931   GFRNONAA >60 07/21/2018 0931   GFRAA >60 07/21/2018 0931    No results found for: SPEP, UPEP  Lab Results  Component Value Date   WBC 4.1 07/21/2018   NEUTROABS 1.9 07/21/2018   HGB 13.2 07/21/2018   HCT 41.3 07/21/2018   MCV 80.5 07/21/2018   PLT 220 07/21/2018      Chemistry      Component Value  Date/Time   NA 138 07/21/2018 0931   K 3.6 07/21/2018 0931   CL 100 07/21/2018 0931   CO2 26 07/21/2018 0931   BUN 7 07/21/2018 0931   CREATININE 0.74 07/21/2018 0931      Component Value Date/Time   CALCIUM 9.1 07/21/2018 0931   ALKPHOS 113 07/21/2018 0931   AST 18 07/21/2018 0931   ALT 29 07/21/2018 0931   BILITOT 0.6 07/21/2018 0931       RADIOGRAPHIC STUDIES: I have personally reviewed the radiological images as listed and agreed with the findings in the report. Nm Pet Image Initial (pi) Skull Base To Thigh  Result Date: 07/02/2018 CLINICAL DATA:  Initial treatment strategy for vaginal cancer. EXAM: NUCLEAR MEDICINE PET SKULL BASE TO THIGH TECHNIQUE: 7.8 mCi F-18 FDG was injected intravenously. Full-ring PET imaging was performed from the skull base to thigh after the radiotracer. CT data was obtained and used for attenuation correction and anatomic localization. Fasting blood glucose: 98 mg/dl COMPARISON:  CT scan 06/22/2018 FINDINGS: Mediastinal blood pool activity: SUV max 1.6 NECK: Symmetric intense activity identified in the oropharynx bilaterally. Left-sided cervical lymph node (  22/3) measures 11 mm with SUV max = 4.4. 14 mm short axis right cervical lymph node (23/3) demonstrates SUV max = 3.8. Incidental CT findings: none CHEST: No hypermetabolic mediastinal or hilar nodes. No suspicious pulmonary nodules on the CT scan. Incidental CT findings: Tiny bilateral pulmonary nodules are evident, as seen on recent diagnostic chest CT. These are below threshold for resolution on PET imaging. No focal consolidation or pleural effusion. ABDOMEN/PELVIS: No abnormal hypermetabolic activity within the liver, pancreas, adrenal glands, or spleen. No hypermetabolic lymph nodes in the abdomen. 12 mm short axis right internal iliac lymph node (671/2) is hypermetabolic with SUV max = 45.8. 8 mm short axis right internal iliac lymph node (165/3) is also hypermetabolic with SUV max = 6.7. Uterus  surgically absent with hypermetabolism identified in the vaginal cuff demonstrating SUV max = 7.2. Diffuse uptake identified in the right ovary, nonspecific. Incidental CT findings: noneTiny nonobstructing stone identified upper pole right kidney. SKELETON: No focal hypermetabolic activity to suggest skeletal metastasis. Incidental CT findings: No worrisome lytic or sclerotic osseous abnormality. IMPRESSION: 1. Hypermetabolic right internal iliac lymph nodes, consistent with metastatic disease. 2. Hypermetabolic cervical lymph nodes bilaterally. This would be atypical metastatic spread for the reported history of vaginal cancer. The patient does have diffuse, but symmetric hypermetabolic uptake in the nasopharynx and hypopharynx. Infectious/inflammatory etiology a consideration although neoplasm not excluded. 3. Hypermetabolic FDG accumulation in the right ovary, nonspecific with no gross mass lesion evident. Left ovary is high in the left pelvis with low level FDG uptake. 4. Tiny bilateral pulmonary nodules as seen on previous diagnostic CT. These are below threshold size for reliable resolution on PET imaging. Electronically Signed   By: Misty Stanley M.D.   On: 07/02/2018 12:03   Ir Imaging Guided Port Insertion  Result Date: 07/12/2018 CLINICAL DATA:  Vaginal carcinoma, needs durable venous access for planned chemotherapy regimen. EXAM: TUNNELED PORT CATHETER PLACEMENT WITH ULTRASOUND AND FLUOROSCOPIC GUIDANCE FLUOROSCOPY TIME:  0.1 minute; 7 uGym2 DAP ANESTHESIA/SEDATION: Intravenous Fentanyl and Versed were administered as conscious sedation during continuous monitoring of the patient's level of consciousness and physiological / cardiorespiratory status by the radiology RN, with a total moderate sedation time of 15 minutes. TECHNIQUE: The procedure, risks, benefits, and alternatives were explained to the patient. Questions regarding the procedure were encouraged and answered. The patient understands and  consents to the procedure. As antibiotic prophylaxis, cefazolin 2 g was ordered pre-procedure and administered intravenously within one hour of incision. Patency of the right IJ vein was confirmed with ultrasound with image documentation. An appropriate skin site was determined. Skin site was marked. Region was prepped using maximum barrier technique including cap and mask, sterile gown, sterile gloves, large sterile sheet, and Chlorhexidine as cutaneous antisepsis. The region was infiltrated locally with 1% lidocaine. Under real-time ultrasound guidance, the right IJ vein was accessed with a 21 gauge micropuncture needle; the needle tip within the vein was confirmed with ultrasound image documentation. Needle was exchanged over a 018 guidewire for transitional dilator which allowed passage of the [facility] wire into the IVC. Over this, the transitional dilator was exchanged for a 5 [place] MPA catheter. A small incision was made on the right anterior chest wall and a subcutaneous pocket fashioned. The power-injectable port was positioned and its catheter tunneled to the right IJ dermatotomy site. The MPA catheter was exchanged over an Amplatz wire for a peel-away sheath, through which the port catheter, which had been trimmed to the appropriate length, was advanced and positioned  under fluoroscopy with its tip at the cavoatrial junction. Spot chest radiograph confirms good catheter position and no pneumothorax. The pocket was closed with deep interrupted and subcuticular continuous 3-0 Monocryl sutures. The port was flushed per protocol. The incisions were covered with Dermabond then covered with a sterile dressing. COMPLICATIONS: COMPLICATIONS None immediate IMPRESSION: Technically successful right IJ power-injectable port catheter placement. Ready for routine use. Electronically Signed   By: Lucrezia Europe M.D.   On: 07/12/2018 15:19    All questions were answered. The patient knows to call the clinic with any  problems, questions or concerns. No barriers to learning was detected.  I spent 15 minutes counseling the patient face to face. The total time spent in the appointment was 20 minutes and more than 50% was on counseling and review of test results  Heath Lark, MD 07/22/2018 3:18 PM

## 2018-07-22 NOTE — Assessment & Plan Note (Signed)
She has severe diarrhea due to side effects of treatment I recommend Imodium and increase fluid hydration as tolerated.

## 2018-07-22 NOTE — Assessment & Plan Note (Signed)
She had recent excessive fatigue due to side effects of treatment I gave her letter to restrict the number of hours of work to 4 hours or less as tolerated

## 2018-07-22 NOTE — Assessment & Plan Note (Addendum)
So far, she tolerated treatment well with minor side effects such as diarrhea and fatigue along with altered taste sensation We will continue weekly follow-up for toxicity review and proceed with treatment as scheduled I recommend consideration to be off work while on treatment

## 2018-07-23 ENCOUNTER — Ambulatory Visit
Admission: RE | Admit: 2018-07-23 | Discharge: 2018-07-23 | Disposition: A | Discharge status: Home / Self Care | Payer: 59 | Source: Ambulatory Visit | Attending: Radiation Oncology | Admitting: Radiation Oncology

## 2018-07-23 ENCOUNTER — Inpatient Hospital Stay: Payer: 59

## 2018-07-23 VITALS — BP 118/76 | HR 89 | Temp 98.8°F | Resp 16

## 2018-07-23 DIAGNOSIS — C52 Malignant neoplasm of vagina: Secondary | ICD-10-CM | POA: Diagnosis not present

## 2018-07-23 MED ORDER — SODIUM CHLORIDE 0.9 % IV SOLN
Freq: Once | INTRAVENOUS | Status: AC
  Administered 2018-07-23: 11:00:00 via INTRAVENOUS
  Filled 2018-07-23: qty 5

## 2018-07-23 MED ORDER — SODIUM CHLORIDE 0.9 % IV SOLN
40.0000 mg/m2 | Freq: Once | INTRAVENOUS | Status: AC
  Administered 2018-07-23: 72 mg via INTRAVENOUS
  Filled 2018-07-23: qty 72

## 2018-07-23 MED ORDER — HEPARIN SOD (PORK) LOCK FLUSH 100 UNIT/ML IV SOLN
500.0000 [IU] | Freq: Once | INTRAVENOUS | Status: AC | PRN
  Administered 2018-07-23: 500 [IU]
  Filled 2018-07-23: qty 5

## 2018-07-23 MED ORDER — SODIUM CHLORIDE 0.9 % IV SOLN
Freq: Once | INTRAVENOUS | Status: AC
  Administered 2018-07-23: 08:00:00 via INTRAVENOUS
  Filled 2018-07-23: qty 250

## 2018-07-23 MED ORDER — POTASSIUM CHLORIDE 2 MEQ/ML IV SOLN
Freq: Once | INTRAVENOUS | Status: AC
  Administered 2018-07-23: 09:00:00 via INTRAVENOUS
  Filled 2018-07-23: qty 10

## 2018-07-23 MED ORDER — PALONOSETRON HCL INJECTION 0.25 MG/5ML
INTRAVENOUS | Status: AC
  Filled 2018-07-23: qty 5

## 2018-07-23 MED ORDER — PALONOSETRON HCL INJECTION 0.25 MG/5ML
0.2500 mg | Freq: Once | INTRAVENOUS | Status: AC
  Administered 2018-07-23: 0.25 mg via INTRAVENOUS

## 2018-07-23 MED ORDER — SODIUM CHLORIDE 0.9% FLUSH
10.0000 mL | INTRAVENOUS | Status: DC | PRN
  Administered 2018-07-23: 10 mL
  Filled 2018-07-23: qty 10

## 2018-07-23 NOTE — Patient Instructions (Signed)
[place] Discharge Instructions for Patients Receiving Chemotherapy  Today you received the following chemotherapy agents: cisplatin  To help prevent nausea and vomiting after your treatment, we encourage you to take your nausea medication as directed by your physician.   If you develop nausea and vomiting that is not controlled by your nausea medication, call the clinic.   BELOW ARE SYMPTOMS THAT SHOULD BE REPORTED IMMEDIATELY:  *FEVER GREATER THAN 100.5 F  *CHILLS WITH OR WITHOUT FEVER  NAUSEA AND VOMITING THAT IS NOT CONTROLLED WITH YOUR NAUSEA MEDICATION  *UNUSUAL SHORTNESS OF BREATH  *UNUSUAL BRUISING OR BLEEDING  TENDERNESS IN MOUTH AND THROAT WITH OR WITHOUT PRESENCE OF ULCERS  *URINARY PROBLEMS  *BOWEL PROBLEMS  UNUSUAL RASH Items with * indicate a potential emergency and should be followed up as soon as possible.  Feel free to call the clinic should you have any questions or concerns. The clinic phone number is [phone].  Please show the [place] at check-in to the Emergency Department and triage nurse.

## 2018-07-26 ENCOUNTER — Ambulatory Visit
Admission: RE | Admit: 2018-07-26 | Discharge: 2018-07-26 | Disposition: A | Discharge status: Home / Self Care | Payer: 59 | Source: Ambulatory Visit | Attending: Radiation Oncology | Admitting: Radiation Oncology

## 2018-07-26 ENCOUNTER — Encounter: Payer: Self-pay | Admitting: Hematology and Oncology

## 2018-07-26 DIAGNOSIS — C52 Malignant neoplasm of vagina: Secondary | ICD-10-CM | POA: Diagnosis not present

## 2018-07-27 ENCOUNTER — Ambulatory Visit
Admission: RE | Admit: 2018-07-27 | Discharge: 2018-07-27 | Disposition: A | Discharge status: Home / Self Care | Payer: 59 | Source: Ambulatory Visit | Attending: Radiation Oncology | Admitting: Radiation Oncology

## 2018-07-27 DIAGNOSIS — C52 Malignant neoplasm of vagina: Secondary | ICD-10-CM | POA: Diagnosis not present

## 2018-07-29 ENCOUNTER — Inpatient Hospital Stay (HOSPITAL_BASED_OUTPATIENT_CLINIC_OR_DEPARTMENT_OTHER): Payer: 59 | Admitting: Hematology and Oncology

## 2018-07-29 ENCOUNTER — Encounter: Payer: Self-pay | Admitting: Hematology and Oncology

## 2018-07-29 ENCOUNTER — Inpatient Hospital Stay: Payer: 59

## 2018-07-29 ENCOUNTER — Ambulatory Visit
Admission: RE | Admit: 2018-07-29 | Discharge: 2018-07-29 | Disposition: A | Discharge status: Home / Self Care | Payer: 59 | Source: Ambulatory Visit | Attending: Radiation Oncology | Admitting: Radiation Oncology

## 2018-07-29 DIAGNOSIS — R197 Diarrhea, unspecified: Secondary | ICD-10-CM

## 2018-07-29 DIAGNOSIS — C52 Malignant neoplasm of vagina: Secondary | ICD-10-CM

## 2018-07-29 DIAGNOSIS — D61818 Other pancytopenia: Secondary | ICD-10-CM | POA: Diagnosis not present

## 2018-07-29 LAB — COMPREHENSIVE METABOLIC PANEL
ALT: 25 U/L (ref 0–44)
ANION GAP: 8 (ref 5–15)
AST: 19 U/L (ref 15–41)
Albumin: 3.9 g/dL (ref 3.5–5.0)
Alkaline Phosphatase: 86 U/L (ref 38–126)
BUN: 9 mg/dL (ref 6–20)
CO2: 28 mmol/L (ref 22–32)
Calcium: 9.1 mg/dL (ref 8.9–10.3)
Chloride: 105 mmol/L (ref 98–111)
Creatinine, Ser: 0.72 mg/dL (ref 0.44–1.00)
GFR calc Af Amer: >60 mL/min (ref >60)
GFR calc non Af Amer: >60 mL/min (ref >60)
Glucose, Bld: 91 mg/dL (ref 70–99)
Potassium: 3.8 mmol/L (ref 3.5–5.1)
Sodium: 141 mmol/L (ref 135–145)
Total Bilirubin: 0.5 mg/dL (ref 0.3–1.2)
Total Protein: 6.9 g/dL (ref 6.5–8.1)

## 2018-07-29 LAB — CBC WITH DIFFERENTIAL/PLATELET
Abs Immature Granulocytes: 0.02 10*3/uL (ref 0.00–0.07)
BASOS PCT: 1 %
Basophils Absolute: 0 10*3/uL (ref 0.0–0.1)
EOS ABS: 0.2 10*3/uL (ref 0.0–0.5)
Eosinophils Relative: 5 %
HCT: 37.6 % (ref 36.0–46.0)
Hemoglobin: 11.9 g/dL — ABNORMAL LOW (ref 12.0–15.0)
Immature Granulocytes: 1 %
Lymphocytes Relative: 30 %
Lymphs Abs: 1.1 10*3/uL (ref 0.7–4.0)
MCH: 25.6 pg — ABNORMAL LOW (ref 26.0–34.0)
MCHC: 31.6 g/dL (ref 30.0–36.0)
MCV: 81 fL (ref 80.0–100.0)
Monocytes Absolute: 0.4 10*3/uL (ref 0.1–1.0)
Monocytes Relative: 12 %
Neutro Abs: 1.8 10*3/uL (ref 1.7–7.7)
Neutrophils Relative %: 51 %
Platelets: 138 10*3/uL — ABNORMAL LOW (ref 150–400)
RBC: 4.64 MIL/uL (ref 3.87–5.11)
RDW: 13.5 % (ref 11.5–15.5)
WBC: 3.5 10*3/uL — ABNORMAL LOW (ref 4.0–10.5)
nRBC: 0 % (ref 0.0–0.2)

## 2018-07-29 LAB — MAGNESIUM: Magnesium: 1.7 mg/dL (ref 1.7–2.4)

## 2018-07-29 MED ORDER — HEPARIN SOD (PORK) LOCK FLUSH 100 UNIT/ML IV SOLN
250.0000 [IU] | Freq: Once | INTRAVENOUS | Status: AC
  Administered 2018-07-29: 250 [IU]
  Filled 2018-07-29: qty 5

## 2018-07-29 MED ORDER — SODIUM CHLORIDE 0.9% FLUSH
10.0000 mL | Freq: Once | INTRAVENOUS | Status: AC
  Administered 2018-07-29: 10 mL
  Filled 2018-07-29: qty 10

## 2018-07-29 NOTE — Progress Notes (Signed)
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  Pancytopenia, acquired ([place]) Due to side effects of treatment.  She is not symptomatic except for mild fatigue.  We will monitor closely.  We will proceed with treatment as scheduled tomorrow without dose adjustment.   No orders of the defined types were placed in this encounter.   INTERVAL HISTORY: Please see below for problem oriented charting. She returns for further follow-up She complained of mild fatigue She also have some mild diarrhea and occasional nausea Denies peripheral neuropathy No recent infection, fever or chills The patient denies any recent signs or symptoms of bleeding such as spontaneous epistaxis, hematuria or hematochezia.  SUMMARY OF ONCOLOGIC HISTORY:   Vaginal cancer ([place])   03/01/2013 Surgery    OPERATIVE REPORT  PREOPERATIVE DIAGNOSIS: High-grade dysplasia on Pap smear  POSTOPERATIVE DIAGNOSIS: Same  PROCEDURE: Exam under anesthesia, endocervical curettage, vaginal biopsies  SURGEON: Marti Sleigh, M.D SURGICAL FINDINGS: Examination under anesthesia revealed a cervix which was flush with the vaginal fornices. The cervical canal was stenotic but once entered and dilated the uterus sounded approximate 7 cm anteriorly. Apply Lugol solution to the cervix showed no lesions. 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 Endotracheal  Surgeon:Tamer Kerin Perna, MD Findings: On exam under anesthesia the cervix was closed and deviated to the left. On laparoscopy the liver, gallbladder, and appendix were normal.  The uterus was gravid and retroverted and retroflexed, both tubes and ovaries appeared normal.  Intraoperative ultrasound transabdominally and transvaginally showed a singleton intrauterine pregnancy at length consistent with 11 weeks. There was fetal cardiac activity and movements.     01/19/2018 Pathology Results    Cervical biopsy showed high grade squamous intraepithelial with severe dysplasia    02/09/2018 Pathology Results    Cervix, biopsy - SUPERFICIAL FRAGMENTS OF CERVICAL MUCOSA SHOWING AT LEAST HIGH GRADE SQUAMOUS INTRAEPITHELIAL LESION (HSIL, CIN-III). - DEFINITIVE INVASION IS NOT SEEN BUT CANNOT BE ENTIRELY RULED OUT.    02/18/2018 Pathology Results    1. Cervix, cone - BENIGN SQUAMOUS AND ENDOCERVICAL MUCOSA, SEE COMMENT. - NO DYSPLASIA OR MALIGNANCY. 2. Endocervix, curettage - BLOOD. - NO MUCOSA PRESENT. Microscopic Comment 1. p16 is negative in the squamous epithelium    02/18/2018 Surgery    Preop Diagnosis: CIN III  Postoperative Diagnosis: same  Surgery: cold knife conization of cervix   Surgeons:  Donaciano Eva, MD Pathology: cervical cone with marking stitch at 12 o'clock, post cone ECC  Operative findings: no grossly visible exophytic lesions on ectocervix. Cervix flush with vagina.     03/11/2018 Pathology Results    Vagina, biopsy, posterior upper vaginal wall - HIGH GRADE  SQUAMOUS DYSPLASIA, SEE COMMENT.  Microscopic Comment There are detached squamous fragments with high grade dysplastic features. Several of the fragments have a minimal amount of attached stroma which is inflamed and focally fibrotic. While an invasive component cannot be ruled out, the limited stroma present hampers evaluate for invasion    04/27/2018 Pathology Results    Vagina, biopsy, posterior - SUPERFICIAL FRAGMENTS OF EXTENSIVE SQUAMOUS CELL CARCINOMA IN SITU. - SEE NOTE. Diagnosis Note Though focally suspicious, definitive evidence of invasive carcinoma is not seen in the submitted biopsies. The submitted fragments are superficial in nature and a deeper, more severe process cannot be ruled out. Dr Tresa Moore has reviewed this case and concurs with the above interpretation.    06/01/2018 Pathology Results    Uterus +/- tubes/ovaries, neoplastic, upper 1/3 of vagina - INVASIVE MODERATELY DIFFERENTIATED SQUAMOUS CELL CARCINOMA, 3.0 CM, INVOLVING POSTERIOR WALL OF UPPER THIRD OF VAGINA. - CARCINOMA INVOLVES THE CAUTERIZED DEEP RESECTION MARGIN; MUCOSAL MARGINS ARE NOT INVOLVED. - LYMPHOVASCULAR INVASION IS PRESENT. - UTERUS WITH BENIGN PROLIFERATIVE ENDOMETRIUM. - BENIGN UNREMARKABLE CERVIX. - BENIGN UNREMARKABLE BILATERAL FALLOPIAN TUBE. - SEE ONCOLOGY TABLE. - SEE NOTE. Microscopic Comment VAGINA: Resection Procedure: Partial vaginectomy with total hysterectomy and bilateral salpingectomy. Tumor Site: Posterior wall of upper third of vagina. Tumor Size: 3.0 cm. Histologic Type: Squamous cell carcinoma. Histologic Grade: G2: Moderately differentiated. Other Tissue/ Organ: Uterus and bilateral fallopian tubes are not involved by carcinoma. Margins: Deep cauterized margin is positive for carcinoma. Lymphovascular Invasion: Present. Regional Lymph Nodes: No lymph nodes submitted or found. Number of Lymph Nodes Examined: 0. Pathologic Stage Classification (pTNM, AJCC 8th Edition): pT1b,  pNX. Representative Tumor Block: 1C.  Lesion is HPV positive    06/01/2018 Surgery    Pre-operative Diagnosis: history of cervical cancer, VAIN III of upper posterior vagina  Post-operative Diagnosis: same  Operation: Robotic-assisted laparoscopic total hysterectomy with bilateral salpingectomy with upper vaginectomy and oophoropexy  Surgeon: Donaciano Eva  Operative Findings:  : 6cm uterus with normal tubes and ovaries (benign appearing functional cyst on left ovary measuring 2cm). Cerclage (merseline) in place with knot anteriorally. Bladder flap adhesions. 3x3cm discoid raised erythematous lesion on posterior upper wall of vagina with close proximity to cervix. No visible lesion remaining in vagina at completion of procedure.     06/22/2018 Imaging    Ct imaging 1. Status post total abdominal hysterectomy. No definitive findings to strongly suggest metastatic disease to the chest, abdomen or pelvis. 2. 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 Further evaluation with nonemergent MRI of the abdomen with and without IV gadolinium is strongly recommended to provide definitive characterization of this lesion in the near future. 3. Two small pulmonary nodules measuring 5 mm or less in size, nonspecific but statistically likely benign. Attention on routine follow-up studies is recommended to ensure their stability. 4. 4 mm nonobstructive calculus in the upper pole collecting system of the right kidney. No ureteral stones or findings of urinary tract obstruction are noted at this time    07/02/2018 PET scan    1. Hypermetabolic right internal iliac lymph nodes, consistent with metastatic disease. 2. Hypermetabolic cervical lymph nodes bilaterally. This would be atypical metastatic spread for the reported history of vaginal cancer. The patient does have diffuse, but symmetric hypermetabolic uptake in the nasopharynx and  hypopharynx. Infectious/inflammatory etiology a consideration although neoplasm not excluded. 3. Hypermetabolic FDG accumulation in the right ovary, nonspecific with no gross mass lesion evident. Left ovary is high in the  left pelvis with low level FDG uptake. 4. Tiny bilateral pulmonary nodules as seen on previous diagnostic CT. These are below threshold size for reliable resolution on PET imaging.     07/08/2018 Cancer Staging    Staging form: Vagina, AJCC 7th Edition - Pathologic: Stage III (T1, N1, cM0) - Signed by Heath Lark, MD on 07/08/2018    07/12/2018 Procedure    Technically successful right IJ power-injectable port catheter placement. Ready for routine use.    07/16/2018 -  Chemotherapy    The patient had palonosetron (ALOXI) injection 0.25 mg, 0.25 mg, Intravenous,  Once, 1 of 6 cycles  Administration: 0.25 mg (07/16/2018)    CISplatin (PLATINOL) 72 mg in sodium chloride 0.9 % 250 mL chemo infusion, 40 mg/m2 = 72 mg, Intravenous,  Once, 1 of 6 cycles  Administration: 72 mg (07/16/2018)    fosaprepitant (EMEND) 150 mg, dexamethasone (DECADRON) 12 mg in sodium chloride 0.9 % 145 mL IVPB, , Intravenous,  Once, 1 of 6 cycles  Administration:  (07/16/2018)  for chemotherapy treatment.  She received weekly cisplatin     REVIEW OF SYSTEMS:   Constitutional: Denies fevers, chills or abnormal weight loss Eyes: Denies blurriness of vision Ears, nose, mouth, throat, and face: Denies mucositis or sore throat Respiratory: Denies cough, dyspnea or wheezes Cardiovascular: Denies palpitation, chest discomfort or lower extremity swelling Skin: Denies abnormal skin rashes Lymphatics: Denies new lymphadenopathy or easy bruising Neurological:Denies numbness, tingling or new weaknesses Behavioral/Psych: Mood is stable, no new changes  All other systems were reviewed with the patient and are negative.  I have reviewed the past medical history, past surgical history, social history  and family history with the patient and they are unchanged from previous note.  ALLERGIES:  is allergic to sulfa antibiotics; adhesive [tape]; other; relpax [eletriptan hydrobromide]; and ultram [tramadol].  MEDICATIONS:  Current Outpatient Medications  Medication Sig Dispense Refill  . albuterol (PROVENTIL HFA;VENTOLIN HFA) 108 (90 Base) MCG/ACT inhaler Inhale 2 puffs into the lungs every 6 (six) hours as needed for wheezing or shortness of breath. 1 Inhaler 1  . ALPRAZolam (XANAX) 0.5 MG tablet Take 1 tablet (0.5 mg total) by mouth at bedtime as needed for anxiety. 30 tablet 3  . butalbital-acetaminophen-caffeine (FIORICET, ESGIC) 50-325-40 MG tablet Take 1-2 tablets by mouth every 4 (four) hours as needed for migraine.     Marland Kitchen ibuprofen (ADVIL,MOTRIN) 800 MG tablet Take 1 tablet (800 mg total) by mouth every 8 (eight) hours as needed. 30 tablet 0  . LamoTRIgine (LAMICTAL XR) 200 MG TB24 24 hour tablet Take 2 tablets (400 mg total) by mouth 2 (two) times daily. (Patient taking differently: Take 400 mg by mouth 2 (two) times daily. Takes 1000 and 2200) 360 tablet 4  . LamoTRIgine 100 MG TB24 24 hour tablet Take 1 tablet (100 mg total) by mouth at bedtime. 30 tablet 11  . levETIRAcetam (KEPPRA) 750 MG tablet Take 2 tablets (1,500 mg total) by mouth 2 (two) times daily. 360 tablet 4  . lidocaine-prilocaine (EMLA) cream Apply to affected area once 30 g 3  . loratadine (CLARITIN) 10 MG tablet Take 10 mg by mouth every morning.     . montelukast (SINGULAIR) 10 MG tablet TAKE 1 TABLET BY MOUTH EVERYDAY AT BEDTIME 30 tablet 0  . Multiple Vitamins-Minerals (CENTRUM WOMEN) TABS Take 1 tablet by mouth daily.    . ondansetron (ZOFRAN) 8 MG tablet Take 1 tablet (8 mg total) by mouth 2 (two) times daily as needed. Start  on the third day after chemotherapy. 30 tablet 1  . prochlorperazine (COMPAZINE) 10 MG tablet Take 1 tablet (10 mg total) by mouth every 6 (six) hours as needed (Nausea or vomiting). 30 tablet 1   . SYNTHROID 150 MCG tablet Take 1 tablet (150 mcg total) by mouth daily before breakfast. 90 tablet 3   No current facility-administered medications for this visit.     PHYSICAL EXAMINATION: ECOG PERFORMANCE STATUS: 1 - Symptomatic but completely ambulatory  Vitals:   07/29/18 1043  BP: 107/73  Pulse: 87  Resp: 18  Temp: 98 F (36.7 C)  SpO2: 99%   Filed Weights   07/29/18 1043  Weight: 156 lb 6.4 oz (70.9 kg)    GENERAL:alert, no distress and comfortable SKIN: skin color, texture, turgor are normal, no rashes or significant lesions EYES: normal, Conjunctiva are pink and non-injected, sclera clear OROPHARYNX:no exudate, no erythema and lips, buccal mucosa, and tongue normal  NECK: supple, thyroid normal size, non-tender, without nodularity LYMPH:  no palpable lymphadenopathy in the cervical, axillary or inguinal LUNGS: clear to auscultation and percussion with normal breathing effort HEART: regular rate & rhythm and no murmurs and no lower extremity edema ABDOMEN:abdomen soft, non-tender and normal bowel sounds Musculoskeletal:no cyanosis of digits and no clubbing  NEURO: alert & oriented x 3 with fluent speech, no focal motor/sensory deficits  LABORATORY DATA:  I have reviewed the data as listed    Component Value Date/Time   NA 141 07/29/2018 1007   K 3.8 07/29/2018 1007   CL 105 07/29/2018 1007   CO2 28 07/29/2018 1007   GLUCOSE 91 07/29/2018 1007   BUN 9 07/29/2018 1007   CREATININE 0.72 07/29/2018 1007   CALCIUM 9.1 07/29/2018 1007   PROT 6.9 07/29/2018 1007   ALBUMIN 3.9 07/29/2018 1007   AST 19 07/29/2018 1007   ALT 25 07/29/2018 1007   ALKPHOS 86 07/29/2018 1007   BILITOT 0.5 07/29/2018 1007   GFRNONAA >60 07/29/2018 1007   GFRAA >60 07/29/2018 1007    No results found for: SPEP, UPEP  Lab Results  Component Value Date   WBC 3.5 (L) 07/29/2018   NEUTROABS 1.8 07/29/2018   HGB 11.9 (L) 07/29/2018   HCT 37.6 07/29/2018   MCV 81.0 07/29/2018    PLT 138 (L) 07/29/2018      Chemistry      Component Value Date/Time   NA 141 07/29/2018 1007   K 3.8 07/29/2018 1007   CL 105 07/29/2018 1007   CO2 28 07/29/2018 1007   BUN 9 07/29/2018 1007   CREATININE 0.72 07/29/2018 1007      Component Value Date/Time   CALCIUM 9.1 07/29/2018 1007   ALKPHOS 86 07/29/2018 1007   AST 19 07/29/2018 1007   ALT 25 07/29/2018 1007   BILITOT 0.5 07/29/2018 1007       RADIOGRAPHIC STUDIES: I have personally reviewed the radiological images as listed and agreed with the findings in the report. Nm Pet Image Initial (pi) Skull Base To Thigh  Result Date: 07/02/2018 CLINICAL DATA:  Initial treatment strategy for vaginal cancer. EXAM: NUCLEAR MEDICINE PET SKULL BASE TO THIGH TECHNIQUE: 7.8 mCi F-18 FDG was injected intravenously. Full-ring PET imaging was performed from the skull base to thigh after the radiotracer. CT data was obtained and used for attenuation correction and anatomic localization. Fasting blood glucose: 98 mg/dl COMPARISON:  CT scan 06/22/2018 FINDINGS: Mediastinal blood pool activity: SUV max 1.6 NECK: Symmetric intense activity identified in the oropharynx bilaterally. Left-sided  cervical lymph node (22/3) measures 11 mm with SUV max = 4.4. 14 mm short axis right cervical lymph node (23/3) demonstrates SUV max = 3.8. Incidental CT findings: none CHEST: No hypermetabolic mediastinal or hilar nodes. No suspicious pulmonary nodules on the CT scan. Incidental CT findings: Tiny bilateral pulmonary nodules are evident, as seen on recent diagnostic chest CT. These are below threshold for resolution on PET imaging. No focal consolidation or pleural effusion. ABDOMEN/PELVIS: No abnormal hypermetabolic activity within the liver, pancreas, adrenal glands, or spleen. No hypermetabolic lymph nodes in the abdomen. 12 mm short axis right internal iliac lymph node (742/5) is hypermetabolic with SUV max = 95.6. 8 mm short axis right internal iliac lymph node  (165/3) is also hypermetabolic with SUV max = 6.7. Uterus surgically absent with hypermetabolism identified in the vaginal cuff demonstrating SUV max = 7.2. Diffuse uptake identified in the right ovary, nonspecific. Incidental CT findings: noneTiny nonobstructing stone identified upper pole right kidney. SKELETON: No focal hypermetabolic activity to suggest skeletal metastasis. Incidental CT findings: No worrisome lytic or sclerotic osseous abnormality. IMPRESSION: 1. Hypermetabolic right internal iliac lymph nodes, consistent with metastatic disease. 2. Hypermetabolic cervical lymph nodes bilaterally. This would be atypical metastatic spread for the reported history of vaginal cancer. The patient does have diffuse, but symmetric hypermetabolic uptake in the nasopharynx and hypopharynx. Infectious/inflammatory etiology a consideration although neoplasm not excluded. 3. Hypermetabolic FDG accumulation in the right ovary, nonspecific with no gross mass lesion evident. Left ovary is high in the left pelvis with low level FDG uptake. 4. Tiny bilateral pulmonary nodules as seen on previous diagnostic CT. These are below threshold size for reliable resolution on PET imaging. Electronically Signed   By: Misty Stanley M.D.   On: 07/02/2018 12:03   Ir Imaging Guided Port Insertion  Result Date: 07/12/2018 CLINICAL DATA:  Vaginal carcinoma, needs durable venous access for planned chemotherapy regimen. EXAM: TUNNELED PORT CATHETER PLACEMENT WITH ULTRASOUND AND FLUOROSCOPIC GUIDANCE FLUOROSCOPY TIME:  0.1 minute; 7 uGym2 DAP ANESTHESIA/SEDATION: Intravenous Fentanyl and Versed were administered as conscious sedation during continuous monitoring of the patient's level of consciousness and physiological / cardiorespiratory status by the radiology RN, with a total moderate sedation time of 15 minutes. TECHNIQUE: The procedure, risks, benefits, and alternatives were explained to the patient. Questions regarding the procedure  were encouraged and answered. The patient understands and consents to the procedure. As antibiotic prophylaxis, cefazolin 2 g was ordered pre-procedure and administered intravenously within one hour of incision. Patency of the right IJ vein was confirmed with ultrasound with image documentation. An appropriate skin site was determined. Skin site was marked. Region was prepped using maximum barrier technique including cap and mask, sterile gown, sterile gloves, large sterile sheet, and Chlorhexidine as cutaneous antisepsis. The region was infiltrated locally with 1% lidocaine. Under real-time ultrasound guidance, the right IJ vein was accessed with a 21 gauge micropuncture needle; the needle tip within the vein was confirmed with ultrasound image documentation. Needle was exchanged over a 018 guidewire for transitional dilator which allowed passage of the [facility] wire into the IVC. Over this, the transitional dilator was exchanged for a 5 [place] MPA catheter. A small incision was made on the right anterior chest wall and a subcutaneous pocket fashioned. The power-injectable port was positioned and its catheter tunneled to the right IJ dermatotomy site. The MPA catheter was exchanged over an Amplatz wire for a peel-away sheath, through which the port catheter, which had been trimmed to the appropriate length, was  advanced and positioned under fluoroscopy with its tip at the cavoatrial junction. Spot chest radiograph confirms good catheter position and no pneumothorax. The pocket was closed with deep interrupted and subcuticular continuous 3-0 Monocryl sutures. The port was flushed per protocol. The incisions were covered with Dermabond then covered with a sterile dressing. COMPLICATIONS: COMPLICATIONS None immediate IMPRESSION: Technically successful right IJ power-injectable port catheter placement. Ready for routine use. Electronically Signed   By: Lucrezia Europe M.D.   On: 07/12/2018 15:19    All questions were  answered. The patient knows to call the clinic with any problems, questions or concerns. No barriers to learning was detected.  I spent 15 minutes counseling the patient face to face. The total time spent in the appointment was 20 minutes and more than 50% was on counseling and review of test results  Heath Lark, MD 07/29/2018 1:03 PM

## 2018-07-29 NOTE — Assessment & Plan Note (Signed)
She has severe diarrhea due to side effects of treatment I recommend Imodium and increase fluid hydration as tolerated.

## 2018-07-29 NOTE — Assessment & Plan Note (Signed)
Due to side effects of treatment.  She is not symptomatic except for mild fatigue.  We will monitor closely.  We will proceed with treatment as scheduled tomorrow without dose adjustment.

## 2018-07-29 NOTE — Assessment & Plan Note (Signed)
So far, she tolerated treatment well with minor side effects such as mild pancytopenia, diarrhea and fatigue along with altered taste sensation We will continue weekly follow-up for toxicity review and proceed with treatment as scheduled

## 2018-07-30 ENCOUNTER — Inpatient Hospital Stay: Payer: 59

## 2018-07-30 ENCOUNTER — Ambulatory Visit
Admission: RE | Admit: 2018-07-30 | Discharge: 2018-07-30 | Disposition: A | Discharge status: Home / Self Care | Payer: 59 | Source: Ambulatory Visit | Attending: Radiation Oncology | Admitting: Radiation Oncology

## 2018-07-30 VITALS — BP 117/94 | HR 93 | Temp 98.1°F | Resp 14

## 2018-07-30 DIAGNOSIS — C52 Malignant neoplasm of vagina: Secondary | ICD-10-CM | POA: Diagnosis not present

## 2018-07-30 MED ORDER — PALONOSETRON HCL INJECTION 0.25 MG/5ML
INTRAVENOUS | Status: AC
  Filled 2018-07-30: qty 5

## 2018-07-30 MED ORDER — POTASSIUM CHLORIDE 2 MEQ/ML IV SOLN
Freq: Once | INTRAVENOUS | Status: AC
  Administered 2018-07-30: 09:00:00 via INTRAVENOUS
  Filled 2018-07-30: qty 10

## 2018-07-30 MED ORDER — SODIUM CHLORIDE 0.9 % IV SOLN
Freq: Once | INTRAVENOUS | Status: AC
  Administered 2018-07-30: 08:00:00 via INTRAVENOUS
  Filled 2018-07-30: qty 250

## 2018-07-30 MED ORDER — SODIUM CHLORIDE 0.9 % IV SOLN
Freq: Once | INTRAVENOUS | Status: AC
  Administered 2018-07-30: 11:00:00 via INTRAVENOUS
  Filled 2018-07-30: qty 5

## 2018-07-30 MED ORDER — HEPARIN SOD (PORK) LOCK FLUSH 100 UNIT/ML IV SOLN
500.0000 [IU] | Freq: Once | INTRAVENOUS | Status: AC | PRN
  Administered 2018-07-30: 500 [IU]
  Filled 2018-07-30: qty 5

## 2018-07-30 MED ORDER — PALONOSETRON HCL INJECTION 0.25 MG/5ML
0.2500 mg | Freq: Once | INTRAVENOUS | Status: AC
  Administered 2018-07-30: 0.25 mg via INTRAVENOUS

## 2018-07-30 MED ORDER — SODIUM CHLORIDE 0.9% FLUSH
10.0000 mL | INTRAVENOUS | Status: DC | PRN
  Administered 2018-07-30: 10 mL
  Filled 2018-07-30: qty 10

## 2018-07-30 MED ORDER — SODIUM CHLORIDE 0.9 % IV SOLN
40.0000 mg/m2 | Freq: Once | INTRAVENOUS | Status: AC
  Administered 2018-07-30: 72 mg via INTRAVENOUS
  Filled 2018-07-30: qty 72

## 2018-08-02 ENCOUNTER — Ambulatory Visit
Admission: RE | Admit: 2018-08-02 | Discharge: 2018-08-02 | Disposition: A | Discharge status: Home / Self Care | Payer: 59 | Source: Ambulatory Visit | Attending: Radiation Oncology | Admitting: Radiation Oncology

## 2018-08-02 DIAGNOSIS — C52 Malignant neoplasm of vagina: Secondary | ICD-10-CM | POA: Diagnosis not present

## 2018-08-03 ENCOUNTER — Ambulatory Visit
Admission: RE | Admit: 2018-08-03 | Discharge: 2018-08-03 | Disposition: A | Discharge status: Home / Self Care | Payer: 59 | Source: Ambulatory Visit | Attending: Radiation Oncology | Admitting: Radiation Oncology

## 2018-08-03 DIAGNOSIS — C52 Malignant neoplasm of vagina: Secondary | ICD-10-CM | POA: Diagnosis not present

## 2018-08-05 ENCOUNTER — Ambulatory Visit
Admission: RE | Admit: 2018-08-05 | Discharge: 2018-08-05 | Disposition: A | Discharge status: Home / Self Care | Payer: 59 | Source: Ambulatory Visit | Attending: Radiation Oncology | Admitting: Radiation Oncology

## 2018-08-05 ENCOUNTER — Inpatient Hospital Stay: Payer: 59

## 2018-08-05 ENCOUNTER — Encounter: Payer: Self-pay | Admitting: Hematology and Oncology

## 2018-08-05 ENCOUNTER — Inpatient Hospital Stay (HOSPITAL_BASED_OUTPATIENT_CLINIC_OR_DEPARTMENT_OTHER): Payer: 59 | Admitting: Hematology and Oncology

## 2018-08-05 DIAGNOSIS — Z79899 Other long term (current) drug therapy: Secondary | ICD-10-CM | POA: Diagnosis not present

## 2018-08-05 DIAGNOSIS — C52 Malignant neoplasm of vagina: Secondary | ICD-10-CM

## 2018-08-05 DIAGNOSIS — R197 Diarrhea, unspecified: Secondary | ICD-10-CM | POA: Diagnosis not present

## 2018-08-05 DIAGNOSIS — D61818 Other pancytopenia: Secondary | ICD-10-CM | POA: Diagnosis not present

## 2018-08-05 DIAGNOSIS — R110 Nausea: Secondary | ICD-10-CM

## 2018-08-05 LAB — CBC WITH DIFFERENTIAL/PLATELET
Abs Immature Granulocytes: 0.03 10*3/uL (ref 0.00–0.07)
Basophils Absolute: 0 10*3/uL (ref 0.0–0.1)
Basophils Relative: 1 %
EOS ABS: 0.2 10*3/uL (ref 0.0–0.5)
EOS PCT: 5 %
HCT: 39.3 % (ref 36.0–46.0)
Hemoglobin: 12.7 g/dL (ref 12.0–15.0)
Immature Granulocytes: 1 %
Lymphocytes Relative: 29 %
Lymphs Abs: 0.9 10*3/uL (ref 0.7–4.0)
MCH: 26.3 pg (ref 26.0–34.0)
MCHC: 32.3 g/dL (ref 30.0–36.0)
MCV: 81.4 fL (ref 80.0–100.0)
Monocytes Absolute: 0.4 10*3/uL (ref 0.1–1.0)
Monocytes Relative: 11 %
Neutro Abs: 1.8 10*3/uL (ref 1.7–7.7)
Neutrophils Relative %: 53 %
Platelets: 140 10*3/uL — ABNORMAL LOW (ref 150–400)
RBC: 4.83 MIL/uL (ref 3.87–5.11)
RDW: 14.3 % (ref 11.5–15.5)
WBC: 3.3 10*3/uL — AB (ref 4.0–10.5)
nRBC: 0 % (ref 0.0–0.2)

## 2018-08-05 LAB — MAGNESIUM: Magnesium: 1.7 mg/dL (ref 1.7–2.4)

## 2018-08-05 LAB — COMPREHENSIVE METABOLIC PANEL
ALT: 22 U/L (ref 0–44)
ANION GAP: 12 (ref 5–15)
AST: 14 U/L — ABNORMAL LOW (ref 15–41)
Albumin: 4 g/dL (ref 3.5–5.0)
Alkaline Phosphatase: 88 U/L (ref 38–126)
BUN: 10 mg/dL (ref 6–20)
CO2: 21 mmol/L — ABNORMAL LOW (ref 22–32)
Calcium: 9.3 mg/dL (ref 8.9–10.3)
Chloride: 106 mmol/L (ref 98–111)
Creatinine, Ser: 0.79 mg/dL (ref 0.44–1.00)
GFR calc Af Amer: >60 mL/min (ref >60)
GFR calc non Af Amer: >60 mL/min (ref >60)
Glucose, Bld: 114 mg/dL — ABNORMAL HIGH (ref 70–99)
Potassium: 4 mmol/L (ref 3.5–5.1)
Sodium: 139 mmol/L (ref 135–145)
Total Bilirubin: 0.5 mg/dL (ref 0.3–1.2)
Total Protein: 7 g/dL (ref 6.5–8.1)

## 2018-08-05 MED ORDER — HEPARIN SOD (PORK) LOCK FLUSH 100 UNIT/ML IV SOLN
500.0000 [IU] | Freq: Once | INTRAVENOUS | Status: AC
  Administered 2018-08-05: 500 [IU]
  Filled 2018-08-05: qty 5

## 2018-08-05 MED ORDER — SODIUM CHLORIDE 0.9% FLUSH
10.0000 mL | Freq: Once | INTRAVENOUS | Status: AC
  Administered 2018-08-05: 10 mL
  Filled 2018-08-05: qty 10

## 2018-08-05 NOTE — Assessment & Plan Note (Signed)
So far, she tolerated treatment well with minor side effects such as mild pancytopenia, nausea, diarrhea and fatigue along with altered taste sensation We will continue weekly follow-up for toxicity review and proceed with treatment as scheduled

## 2018-08-05 NOTE — Progress Notes (Signed)
[place] OFFICE PROGRESS NOTE  Patient Care Team: Brien Few, MD as PCP - General (Obstetrics and Gynecology)  ASSESSMENT & PLAN:  Vaginal cancer [facility]) So far, she tolerated treatment well with minor side effects such as mild pancytopenia, nausea, diarrhea and fatigue along with altered taste sensation We will continue weekly follow-up for toxicity review and proceed with treatment as scheduled  Pancytopenia, acquired ([place]) Due to side effects of treatment.  She is not symptomatic except for mild fatigue.  We will monitor closely.  We will proceed with treatment as scheduled tomorrow without dose adjustment.  Diarrhea She has severe diarrhea due to side effects of treatment I recommend Imodium and increase fluid hydration as tolerated.  Nausea without vomiting She has some mild nausea but no vomiting. I recommend scheduled antiemetics daily before treatment.   No orders of the defined types were placed in this encounter.   INTERVAL HISTORY: Please see below for problem oriented charting. She returns for chemotherapy follow-up, prior to cycle 4 of treatment She tolerated last cycle of treatment well except for prefers persistent fatigue, altered taste sensation with some mild nausea and diarrhea The patient denies any recent signs or symptoms of bleeding such as spontaneous epistaxis, hematuria or hematochezia. She has rare occasional intermittent hot flashes.  No peripheral neuropathy.  SUMMARY OF ONCOLOGIC HISTORY:   Vaginal cancer ([place])   03/01/2013 Surgery    OPERATIVE REPORT  PREOPERATIVE DIAGNOSIS: High-grade dysplasia on Pap smear  POSTOPERATIVE DIAGNOSIS: Same  PROCEDURE: Exam under anesthesia, endocervical curettage, vaginal biopsies  SURGEON: Marti Sleigh, M.D SURGICAL FINDINGS: Examination under anesthesia revealed a cervix which was flush with the vaginal fornices. The cervical canal was stenotic but once entered and dilated  the uterus sounded approximate 7 cm anteriorly. Apply Lugol solution to the cervix showed no lesions. 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 Endotracheal  Surgeon:Tamer Kerin Perna, MD Findings: On exam under anesthesia the cervix was closed and deviated to the left. On laparoscopy the liver, gallbladder, and appendix were normal.  The uterus was gravid and retroverted and retroflexed, both tubes and ovaries appeared normal.  Intraoperative ultrasound transabdominally and transvaginally showed a singleton intrauterine pregnancy at length consistent with 11 weeks. There was fetal cardiac activity and movements.     01/19/2018 Pathology Results    Cervical biopsy showed high grade squamous intraepithelial with severe dysplasia    02/09/2018 Pathology Results    Cervix, biopsy - SUPERFICIAL FRAGMENTS OF CERVICAL MUCOSA SHOWING AT LEAST HIGH GRADE SQUAMOUS INTRAEPITHELIAL LESION (HSIL, CIN-III). - DEFINITIVE INVASION IS NOT SEEN BUT CANNOT BE ENTIRELY RULED OUT.    02/18/2018 Pathology Results    1. Cervix, cone - BENIGN SQUAMOUS AND ENDOCERVICAL MUCOSA, SEE COMMENT. - NO DYSPLASIA OR MALIGNANCY. 2. Endocervix, curettage - BLOOD. - NO MUCOSA PRESENT. Microscopic Comment 1. p16 is negative in the squamous epithelium    02/18/2018 Surgery    Preop Diagnosis: CIN III  Postoperative Diagnosis: same  Surgery: cold knife conization of cervix   Surgeons:  Donaciano Eva, MD Pathology: cervical cone with marking stitch at 12  o'clock, post cone ECC  Operative  findings: no grossly visible exophytic lesions on ectocervix. Cervix flush with vagina.     03/11/2018 Pathology Results    Vagina, biopsy, posterior upper vaginal wall - HIGH GRADE SQUAMOUS DYSPLASIA, SEE COMMENT. Microscopic Comment There are detached squamous fragments with high grade dysplastic features. Several of the fragments have a minimal amount of attached stroma which is inflamed and focally fibrotic. While an invasive component cannot be ruled out, the limited stroma present hampers evaluate for invasion    04/27/2018 Pathology Results    Vagina, biopsy, posterior - SUPERFICIAL FRAGMENTS OF EXTENSIVE SQUAMOUS CELL CARCINOMA IN SITU. - SEE NOTE. Diagnosis Note Though focally suspicious, definitive evidence of invasive carcinoma is not seen in the submitted biopsies. The submitted fragments are superficial in nature and a deeper, more severe process cannot be ruled out. Dr Tresa Moore has reviewed this case and concurs with the above interpretation.    06/01/2018 Pathology Results    Uterus +/- tubes/ovaries, neoplastic, upper 1/3 of vagina - INVASIVE MODERATELY DIFFERENTIATED SQUAMOUS CELL CARCINOMA, 3.0 CM, INVOLVING POSTERIOR WALL OF UPPER THIRD OF VAGINA. - CARCINOMA INVOLVES THE CAUTERIZED DEEP RESECTION MARGIN; MUCOSAL MARGINS ARE NOT INVOLVED. - LYMPHOVASCULAR INVASION IS PRESENT. - UTERUS WITH BENIGN PROLIFERATIVE ENDOMETRIUM. - BENIGN UNREMARKABLE CERVIX. - BENIGN UNREMARKABLE BILATERAL FALLOPIAN TUBE. - SEE ONCOLOGY TABLE. - SEE NOTE. Microscopic Comment VAGINA: Resection Procedure: Partial vaginectomy with total hysterectomy and bilateral salpingectomy. Tumor Site: Posterior wall of upper third of vagina. Tumor Size: 3.0 cm. Histologic Type: Squamous cell carcinoma. Histologic Grade: G2: Moderately differentiated. Other Tissue/ Organ: Uterus and bilateral fallopian tubes are not involved by carcinoma. Margins: Deep cauterized margin  is positive for carcinoma. Lymphovascular Invasion: Present. Regional Lymph Nodes: No lymph nodes submitted or found. Number of Lymph Nodes Examined: 0. Pathologic Stage Classification (pTNM, AJCC 8th Edition): pT1b, pNX. Representative Tumor Block: 1C.  Lesion is HPV positive    06/01/2018 Surgery    Pre-operative Diagnosis: history of cervical cancer, VAIN III of upper posterior vagina  Post-operative Diagnosis: same  Operation: Robotic-assisted laparoscopic total hysterectomy with bilateral salpingectomy with upper vaginectomy and oophoropexy  Surgeon: Donaciano Eva  Operative Findings:  : 6cm uterus with normal tubes and ovaries (benign appearing functional cyst on left ovary measuring 2cm). Cerclage (merseline) in place with knot anteriorally. Bladder flap adhesions. 3x3cm discoid raised erythematous lesion on posterior upper wall of vagina with close proximity to cervix. No visible lesion remaining in vagina at completion of procedure.     06/22/2018 Imaging    Ct imaging 1. Status post total abdominal hysterectomy. No definitive findings to strongly suggest metastatic disease to the chest, abdomen or pelvis. 2. 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 Further evaluation with nonemergent MRI of the abdomen with and without IV gadolinium is strongly recommended to provide definitive characterization of this lesion in the near future. 3. Two small pulmonary nodules measuring 5 mm or less in size, nonspecific but statistically likely benign. Attention on routine follow-up studies is recommended to ensure their stability. 4. 4 mm nonobstructive calculus in the upper pole collecting system of the right kidney. No ureteral stones or findings of urinary tract obstruction are noted at this time    07/02/2018 PET scan    1. Hypermetabolic right internal iliac lymph nodes, consistent with metastatic disease. 2.  Hypermetabolic cervical lymph nodes bilaterally. This would be atypical metastatic spread for the reported history of vaginal cancer. The patient does have diffuse, but symmetric hypermetabolic  uptake in the nasopharynx and hypopharynx. Infectious/inflammatory etiology a consideration although neoplasm not excluded. 3. Hypermetabolic FDG accumulation in the right ovary, nonspecific with no gross mass lesion evident. Left ovary is high in the left pelvis with low level FDG uptake. 4. Tiny bilateral pulmonary nodules as seen on previous diagnostic CT. These are below threshold size for reliable resolution on PET imaging.     07/08/2018 Cancer Staging    Staging form: Vagina, AJCC 7th Edition - Pathologic: Stage III (T1, N1, cM0) - Signed by Heath Lark, MD on 07/08/2018    07/12/2018 Procedure    Technically successful right IJ power-injectable port catheter placement. Ready for routine use.    07/16/2018 -  Chemotherapy    She received weekly cisplatin     REVIEW OF SYSTEMS:   Constitutional: Denies fevers, chills or abnormal weight loss Eyes: Denies blurriness of vision Ears, nose, mouth, throat, and face: Denies mucositis or sore throat Respiratory: Denies cough, dyspnea or wheezes Cardiovascular: Denies palpitation, chest discomfort or lower extremity swelling Skin: Denies abnormal skin rashes Lymphatics: Denies new lymphadenopathy or easy bruising Neurological:Denies numbness, tingling or new weaknesses Behavioral/Psych: Mood is stable, no new changes  All other systems were reviewed with the patient and are negative.  I have reviewed the past medical history, past surgical history, social history and family history with the patient and they are unchanged from previous note.  ALLERGIES:  is allergic to sulfa antibiotics; adhesive [tape]; other; relpax [eletriptan hydrobromide]; and ultram [tramadol].  MEDICATIONS:  Current Outpatient Medications  Medication Sig Dispense Refill  .  albuterol (PROVENTIL HFA;VENTOLIN HFA) 108 (90 Base) MCG/ACT inhaler Inhale 2 puffs into the lungs every 6 (six) hours as needed for wheezing or shortness of breath. 1 Inhaler 1  . ALPRAZolam (XANAX) 0.5 MG tablet Take 1 tablet (0.5 mg total) by mouth at bedtime as needed for anxiety. 30 tablet 3  . butalbital-acetaminophen-caffeine (FIORICET, ESGIC) 50-325-40 MG tablet Take 1-2 tablets by mouth every 4 (four) hours as needed for migraine.     Marland Kitchen ibuprofen (ADVIL,MOTRIN) 800 MG tablet Take 1 tablet (800 mg total) by mouth every 8 (eight) hours as needed. 30 tablet 0  . LamoTRIgine (LAMICTAL XR) 200 MG TB24 24 hour tablet Take 2 tablets (400 mg total) by mouth 2 (two) times daily. (Patient taking differently: Take 400 mg by mouth 2 (two) times daily. Takes 1000 and 2200) 360 tablet 4  . LamoTRIgine 100 MG TB24 24 hour tablet Take 1 tablet (100 mg total) by mouth at bedtime. 30 tablet 11  . levETIRAcetam (KEPPRA) 750 MG tablet Take 2 tablets (1,500 mg total) by mouth 2 (two) times daily. 360 tablet 4  . lidocaine-prilocaine (EMLA) cream Apply to affected area once 30 g 3  . loratadine (CLARITIN) 10 MG tablet Take 10 mg by mouth every morning.     . montelukast (SINGULAIR) 10 MG tablet TAKE 1 TABLET BY MOUTH EVERYDAY AT BEDTIME 30 tablet 0  . Multiple Vitamins-Minerals (CENTRUM WOMEN) TABS Take 1 tablet by mouth daily.    . ondansetron (ZOFRAN) 8 MG tablet Take 1 tablet (8 mg total) by mouth 2 (two) times daily as needed. Start on the third day after chemotherapy. 30 tablet 1  . prochlorperazine (COMPAZINE) 10 MG tablet Take 1 tablet (10 mg total) by mouth every 6 (six) hours as needed (Nausea or vomiting). 30 tablet 1  . SYNTHROID 150 MCG tablet Take 1 tablet (150 mcg total) by mouth daily before breakfast. 90 tablet 3  No current facility-administered medications for this visit.     PHYSICAL EXAMINATION: ECOG PERFORMANCE STATUS: 1 - Symptomatic but completely ambulatory  Vitals:   08/05/18 0847   BP: 111/73  Pulse: 94  Resp: 18  Temp: 98.1 F (36.7 C)  SpO2: 99%   Filed Weights   08/05/18 0847  Weight: 154 lb 8 oz (70.1 kg)    GENERAL:alert, no distress and comfortable SKIN: skin color, texture, turgor are normal, no rashes or significant lesions EYES: normal, Conjunctiva are pink and non-injected, sclera clear OROPHARYNX:no exudate, no erythema and lips, buccal mucosa, and tongue normal  NECK: supple, thyroid normal size, non-tender, without nodularity LYMPH:  no palpable lymphadenopathy in the cervical, axillary or inguinal LUNGS: clear to auscultation and percussion with normal breathing effort HEART: regular rate & rhythm and no murmurs and no lower extremity edema ABDOMEN:abdomen soft, non-tender and normal bowel sounds Musculoskeletal:no cyanosis of digits and no clubbing  NEURO: alert & oriented x 3 with fluent speech, no focal motor/sensory deficits  LABORATORY DATA:  I have reviewed the data as listed    Component Value Date/Time   NA 141 07/29/2018 1007   K 3.8 07/29/2018 1007   CL 105 07/29/2018 1007   CO2 28 07/29/2018 1007   GLUCOSE 91 07/29/2018 1007   BUN 9 07/29/2018 1007   CREATININE 0.72 07/29/2018 1007   CALCIUM 9.1 07/29/2018 1007   PROT 6.9 07/29/2018 1007   ALBUMIN 3.9 07/29/2018 1007   AST 19 07/29/2018 1007   ALT 25 07/29/2018 1007   ALKPHOS 86 07/29/2018 1007   BILITOT 0.5 07/29/2018 1007   GFRNONAA >60 07/29/2018 1007   GFRAA >60 07/29/2018 1007    No results found for: SPEP, UPEP  Lab Results  Component Value Date   WBC 3.3 (L) 08/05/2018   NEUTROABS 1.8 08/05/2018   HGB 12.7 08/05/2018   HCT 39.3 08/05/2018   MCV 81.4 08/05/2018   PLT 140 (L) 08/05/2018      Chemistry      Component Value Date/Time   NA 141 07/29/2018 1007   K 3.8 07/29/2018 1007   CL 105 07/29/2018 1007   CO2 28 07/29/2018 1007   BUN 9 07/29/2018 1007   CREATININE 0.72 07/29/2018 1007      Component Value Date/Time   CALCIUM 9.1 07/29/2018  1007   ALKPHOS 86 07/29/2018 1007   AST 19 07/29/2018 1007   ALT 25 07/29/2018 1007   BILITOT 0.5 07/29/2018 1007       RADIOGRAPHIC STUDIES: I have personally reviewed the radiological images as listed and agreed with the findings in the report. Ir Imaging Guided Port Insertion  Result Date: 07/12/2018 CLINICAL DATA:  Vaginal carcinoma, needs durable venous access for planned chemotherapy regimen. EXAM: TUNNELED PORT CATHETER PLACEMENT WITH ULTRASOUND AND FLUOROSCOPIC GUIDANCE FLUOROSCOPY TIME:  0.1 minute; 7 uGym2 DAP ANESTHESIA/SEDATION: Intravenous Fentanyl and Versed were administered as conscious sedation during continuous monitoring of the patient's level of consciousness and physiological / cardiorespiratory status by the radiology RN, with a total moderate sedation time of 15 minutes. TECHNIQUE: The procedure, risks, benefits, and alternatives were explained to the patient. Questions regarding the procedure were encouraged and answered. The patient understands and consents to the procedure. As antibiotic prophylaxis, cefazolin 2 g was ordered pre-procedure and administered intravenously within one hour of incision. Patency of the right IJ vein was confirmed with ultrasound with image documentation. An appropriate skin site was determined. Skin site was marked. Region was prepped using maximum barrier  technique including cap and mask, sterile gown, sterile gloves, large sterile sheet, and Chlorhexidine as cutaneous antisepsis. The region was infiltrated locally with 1% lidocaine. Under real-time ultrasound guidance, the right IJ vein was accessed with a 21 gauge micropuncture needle; the needle tip within the vein was confirmed with ultrasound image documentation. Needle was exchanged over a 018 guidewire for transitional dilator which allowed passage of the [facility] wire into the IVC. Over this, the transitional dilator was exchanged for a 5 [place] MPA catheter. A small incision was made on the  right anterior chest wall and a subcutaneous pocket fashioned. The power-injectable port was positioned and its catheter tunneled to the right IJ dermatotomy site. The MPA catheter was exchanged over an Amplatz wire for a peel-away sheath, through which the port catheter, which had been trimmed to the appropriate length, was advanced and positioned under fluoroscopy with its tip at the cavoatrial junction. Spot chest radiograph confirms good catheter position and no pneumothorax. The pocket was closed with deep interrupted and subcuticular continuous 3-0 Monocryl sutures. The port was flushed per protocol. The incisions were covered with Dermabond then covered with a sterile dressing. COMPLICATIONS: COMPLICATIONS None immediate IMPRESSION: Technically successful right IJ power-injectable port catheter placement. Ready for routine use. Electronically Signed   By: Lucrezia Europe M.D.   On: 07/12/2018 15:19    All questions were answered. The patient knows to call the clinic with any problems, questions or concerns. No barriers to learning was detected.  I spent 15 minutes counseling the patient face to face. The total time spent in the appointment was 20 minutes and more than 50% was on counseling and review of test results  Heath Lark, MD 08/05/2018 8:57 AM

## 2018-08-05 NOTE — Assessment & Plan Note (Signed)
She has some mild nausea but no vomiting. I recommend scheduled antiemetics daily before treatment.

## 2018-08-05 NOTE — Assessment & Plan Note (Signed)
She has severe diarrhea due to side effects of treatment I recommend Imodium and increase fluid hydration as tolerated.

## 2018-08-05 NOTE — Assessment & Plan Note (Signed)
Due to side effects of treatment.  She is not symptomatic except for mild fatigue.  We will monitor closely.  We will proceed with treatment as scheduled tomorrow without dose adjustment.

## 2018-08-06 ENCOUNTER — Ambulatory Visit
Admission: RE | Admit: 2018-08-06 | Discharge: 2018-08-06 | Disposition: A | Discharge status: Home / Self Care | Payer: 59 | Source: Ambulatory Visit | Attending: Radiation Oncology | Admitting: Radiation Oncology

## 2018-08-06 ENCOUNTER — Telehealth: Payer: Self-pay | Admitting: Hematology and Oncology

## 2018-08-06 ENCOUNTER — Inpatient Hospital Stay: Payer: 59

## 2018-08-06 VITALS — BP 117/82 | HR 94 | Temp 98.4°F | Resp 16

## 2018-08-06 DIAGNOSIS — C52 Malignant neoplasm of vagina: Secondary | ICD-10-CM | POA: Diagnosis not present

## 2018-08-06 MED ORDER — SODIUM CHLORIDE 0.9% FLUSH
10.0000 mL | INTRAVENOUS | Status: DC | PRN
  Administered 2018-08-06: 10 mL
  Filled 2018-08-06: qty 10

## 2018-08-06 MED ORDER — SODIUM CHLORIDE 0.9 % IV SOLN
Freq: Once | INTRAVENOUS | Status: AC
  Administered 2018-08-06: 08:00:00 via INTRAVENOUS
  Filled 2018-08-06: qty 250

## 2018-08-06 MED ORDER — SODIUM CHLORIDE 0.9 % IV SOLN
40.0000 mg/m2 | Freq: Once | INTRAVENOUS | Status: AC
  Administered 2018-08-06: 72 mg via INTRAVENOUS
  Filled 2018-08-06: qty 72

## 2018-08-06 MED ORDER — SODIUM CHLORIDE 0.9 % IV SOLN
Freq: Once | INTRAVENOUS | Status: AC
  Administered 2018-08-06: 10:00:00 via INTRAVENOUS
  Filled 2018-08-06: qty 5

## 2018-08-06 MED ORDER — PALONOSETRON HCL INJECTION 0.25 MG/5ML
0.2500 mg | Freq: Once | INTRAVENOUS | Status: AC
  Administered 2018-08-06: 0.25 mg via INTRAVENOUS

## 2018-08-06 MED ORDER — POTASSIUM CHLORIDE 2 MEQ/ML IV SOLN
Freq: Once | INTRAVENOUS | Status: AC
  Administered 2018-08-06: 08:00:00 via INTRAVENOUS
  Filled 2018-08-06: qty 10

## 2018-08-06 MED ORDER — PALONOSETRON HCL INJECTION 0.25 MG/5ML
INTRAVENOUS | Status: AC
  Filled 2018-08-06: qty 5

## 2018-08-06 MED ORDER — HEPARIN SOD (PORK) LOCK FLUSH 100 UNIT/ML IV SOLN
500.0000 [IU] | Freq: Once | INTRAVENOUS | Status: AC | PRN
  Administered 2018-08-06: 500 [IU]
  Filled 2018-08-06: qty 5

## 2018-08-06 NOTE — Patient Instructions (Signed)
[facility] Discharge Instructions for Patients Receiving Chemotherapy  Today you received the following chemotherapy agents Cisplatin  To help prevent nausea and vomiting after your treatment, we encourage you to take your nausea medication as directed  If you develop nausea and vomiting that is not controlled by your nausea medication, call the clinic.   BELOW ARE SYMPTOMS THAT SHOULD BE REPORTED IMMEDIATELY:  *FEVER GREATER THAN 100.5 F  *CHILLS WITH OR WITHOUT FEVER  NAUSEA AND VOMITING THAT IS NOT CONTROLLED WITH YOUR NAUSEA MEDICATION  *UNUSUAL SHORTNESS OF BREATH  *UNUSUAL BRUISING OR BLEEDING  TENDERNESS IN MOUTH AND THROAT WITH OR WITHOUT PRESENCE OF ULCERS  *URINARY PROBLEMS  *BOWEL PROBLEMS  UNUSUAL RASH Items with * indicate a potential emergency and should be followed up as soon as possible.  Feel free to call the clinic should you have any questions or concerns. The clinic phone number is [phone].  Please show the CHEMO ALERT CARD at check-in to the Emergency Department and triage nurse.   

## 2018-08-06 NOTE — Telephone Encounter (Signed)
Per 1/2 no new orders °

## 2018-08-09 ENCOUNTER — Ambulatory Visit
Admission: RE | Admit: 2018-08-09 | Discharge: 2018-08-09 | Disposition: A | Discharge status: Home / Self Care | Payer: 59 | Source: Ambulatory Visit | Attending: Radiation Oncology | Admitting: Radiation Oncology

## 2018-08-09 DIAGNOSIS — C52 Malignant neoplasm of vagina: Secondary | ICD-10-CM | POA: Diagnosis not present

## 2018-08-10 ENCOUNTER — Ambulatory Visit
Admission: RE | Admit: 2018-08-10 | Discharge: 2018-08-10 | Disposition: A | Discharge status: Home / Self Care | Payer: 59 | Source: Ambulatory Visit | Attending: Radiation Oncology | Admitting: Radiation Oncology

## 2018-08-10 DIAGNOSIS — C52 Malignant neoplasm of vagina: Secondary | ICD-10-CM | POA: Diagnosis not present

## 2018-08-11 ENCOUNTER — Ambulatory Visit
Admission: RE | Admit: 2018-08-11 | Discharge: 2018-08-11 | Disposition: A | Discharge status: Home / Self Care | Payer: 59 | Source: Ambulatory Visit | Attending: Radiation Oncology | Admitting: Radiation Oncology

## 2018-08-11 ENCOUNTER — Inpatient Hospital Stay: Payer: 59

## 2018-08-11 DIAGNOSIS — C52 Malignant neoplasm of vagina: Secondary | ICD-10-CM

## 2018-08-11 LAB — CBC WITH DIFFERENTIAL/PLATELET
ABS IMMATURE GRANULOCYTES: 0 10*3/uL (ref 0.00–0.07)
Basophils Absolute: 0 10*3/uL (ref 0.0–0.1)
Basophils Relative: 1 %
Eosinophils Absolute: 0.1 10*3/uL (ref 0.0–0.5)
Eosinophils Relative: 4 %
HCT: 39.5 % (ref 36.0–46.0)
Hemoglobin: 12.7 g/dL (ref 12.0–15.0)
Immature Granulocytes: 0 %
Lymphocytes Relative: 27 %
Lymphs Abs: 0.7 10*3/uL (ref 0.7–4.0)
MCH: 26.2 pg (ref 26.0–34.0)
MCHC: 32.2 g/dL (ref 30.0–36.0)
MCV: 81.4 fL (ref 80.0–100.0)
Monocytes Absolute: 0.3 10*3/uL (ref 0.1–1.0)
Monocytes Relative: 12 %
NEUTROS ABS: 1.5 10*3/uL — AB (ref 1.7–7.7)
Neutrophils Relative %: 56 %
Platelets: 121 10*3/uL — ABNORMAL LOW (ref 150–400)
RBC: 4.85 MIL/uL (ref 3.87–5.11)
RDW: 15 % (ref 11.5–15.5)
WBC: 2.7 10*3/uL — ABNORMAL LOW (ref 4.0–10.5)
nRBC: 0 % (ref 0.0–0.2)

## 2018-08-11 LAB — COMPREHENSIVE METABOLIC PANEL
ALT: 22 U/L (ref 0–44)
AST: 16 U/L (ref 15–41)
Albumin: 4.1 g/dL (ref 3.5–5.0)
Alkaline Phosphatase: 87 U/L (ref 38–126)
Anion gap: 9 (ref 5–15)
BUN: 8 mg/dL (ref 6–20)
CO2: 25 mmol/L (ref 22–32)
Calcium: 9.3 mg/dL (ref 8.9–10.3)
Chloride: 106 mmol/L (ref 98–111)
Creatinine, Ser: 0.7 mg/dL (ref 0.44–1.00)
GFR calc Af Amer: >60 mL/min (ref >60)
GFR calc non Af Amer: >60 mL/min (ref >60)
Glucose, Bld: 101 mg/dL — ABNORMAL HIGH (ref 70–99)
Potassium: 3.9 mmol/L (ref 3.5–5.1)
Sodium: 140 mmol/L (ref 135–145)
Total Bilirubin: 0.5 mg/dL (ref 0.3–1.2)
Total Protein: 7.3 g/dL (ref 6.5–8.1)

## 2018-08-11 LAB — MAGNESIUM: MAGNESIUM: 1.7 mg/dL (ref 1.7–2.4)

## 2018-08-11 MED ORDER — HEPARIN SOD (PORK) LOCK FLUSH 100 UNIT/ML IV SOLN
500.0000 [IU] | Freq: Once | INTRAVENOUS | Status: AC
  Administered 2018-08-11: 500 [IU]
  Filled 2018-08-11: qty 5

## 2018-08-11 MED ORDER — HEPARIN SOD (PORK) LOCK FLUSH 100 UNIT/ML IV SOLN
250.0000 [IU] | Freq: Once | INTRAVENOUS | Status: DC
  Filled 2018-08-11: qty 5

## 2018-08-11 MED ORDER — SODIUM CHLORIDE 0.9% FLUSH
10.0000 mL | Freq: Once | INTRAVENOUS | Status: AC
  Administered 2018-08-11: 10 mL
  Filled 2018-08-11: qty 10

## 2018-08-12 ENCOUNTER — Encounter: Payer: Self-pay | Admitting: Hematology and Oncology

## 2018-08-12 ENCOUNTER — Inpatient Hospital Stay (HOSPITAL_BASED_OUTPATIENT_CLINIC_OR_DEPARTMENT_OTHER): Payer: 59 | Admitting: Hematology and Oncology

## 2018-08-12 ENCOUNTER — Ambulatory Visit
Admission: RE | Admit: 2018-08-12 | Discharge: 2018-08-12 | Disposition: A | Discharge status: Home / Self Care | Payer: 59 | Source: Ambulatory Visit | Attending: Radiation Oncology | Admitting: Radiation Oncology

## 2018-08-12 DIAGNOSIS — D61818 Other pancytopenia: Secondary | ICD-10-CM | POA: Diagnosis not present

## 2018-08-12 DIAGNOSIS — C52 Malignant neoplasm of vagina: Secondary | ICD-10-CM | POA: Diagnosis not present

## 2018-08-12 DIAGNOSIS — R197 Diarrhea, unspecified: Secondary | ICD-10-CM

## 2018-08-12 DIAGNOSIS — R110 Nausea: Secondary | ICD-10-CM

## 2018-08-12 NOTE — Assessment & Plan Note (Signed)
Due to side effects of treatment.  She is not symptomatic except for mild fatigue.  We will monitor closely.  We will proceed with treatment as scheduled tomorrow without dose adjustment.

## 2018-08-12 NOTE — Assessment & Plan Note (Signed)
She has severe diarrhea due to side effects of treatment I recommend Imodium and increase fluid hydration as tolerated.

## 2018-08-12 NOTE — Assessment & Plan Note (Signed)
She has some mild nausea but no vomiting. I recommend scheduled antiemetics daily before treatment.

## 2018-08-12 NOTE — Assessment & Plan Note (Signed)
So far, she tolerated treatment well with minor side effects such as mild pancytopenia, nausea, diarrhea and fatigue along with altered taste sensation We will continue weekly follow-up for toxicity review and proceed with treatment as scheduled

## 2018-08-12 NOTE — Progress Notes (Signed)
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  She is not symptomatic except for mild fatigue.  We will monitor closely.  We will proceed with treatment as scheduled tomorrow without dose adjustment.  Nausea without vomiting She has some mild nausea but no vomiting. I recommend scheduled antiemetics daily before treatment.  Diarrhea She has severe diarrhea due to side effects of treatment I recommend Imodium and increase fluid hydration as tolerated.   No orders of the defined types were placed in this encounter.   INTERVAL HISTORY: Please see below for problem oriented charting. She is seen prior to cycle 5 of chemotherapy She complained of fatigue, nausea along with frequent diarrhea She is able to hydrate adequately She denies peripheral neuropathy No recent infection, fever or chills The patient denies any recent signs or symptoms of bleeding such as spontaneous epistaxis, hematuria or hematochezia.   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 The cervical canal was stenotic but once entered and dilated the uterus sounded approximate 7 cm anteriorly. Apply Lugol solution to  the cervix showed no lesions. 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 Endotracheal  Surgeon:Tamer Kerin Perna, MD Findings: On exam under anesthesia the cervix was closed and deviated to the left. On laparoscopy the liver, gallbladder, and appendix were normal.  The uterus was gravid and retroverted and retroflexed, both tubes and ovaries appeared normal.  Intraoperative ultrasound transabdominally and transvaginally showed a singleton intrauterine pregnancy at length consistent with 11 weeks. There was fetal cardiac activity and movements.     01/19/2018 Pathology Results    Cervical biopsy showed high grade squamous intraepithelial with severe dysplasia    02/09/2018 Pathology Results    Cervix, biopsy - SUPERFICIAL FRAGMENTS OF CERVICAL MUCOSA SHOWING AT LEAST HIGH GRADE SQUAMOUS INTRAEPITHELIAL LESION (HSIL, CIN-III). - DEFINITIVE INVASION IS NOT SEEN BUT CANNOT BE ENTIRELY RULED OUT.    02/18/2018 Pathology Results    1. Cervix, cone - BENIGN SQUAMOUS AND ENDOCERVICAL MUCOSA, SEE COMMENT. - NO DYSPLASIA OR MALIGNANCY. 2. Endocervix, curettage - BLOOD. - NO MUCOSA PRESENT. Microscopic Comment 1. p16 is negative in the squamous epithelium    02/18/2018 Surgery    Preop Diagnosis: CIN III  Postoperative Diagnosis: same  Surgery: cold knife conization of cervix   Surgeons:  Donaciano Eva, MD Pathology: cervical cone with marking stitch at 12 o'clock, post cone ECC  Operative findings: no grossly visible  exophytic lesions on ectocervix.  Cervix flush with vagina.     03/11/2018 Pathology Results    Vagina, biopsy, posterior upper vaginal wall - HIGH GRADE SQUAMOUS DYSPLASIA, SEE COMMENT. Microscopic Comment There are detached squamous fragments with high grade dysplastic features. Several of the fragments have a minimal amount of attached stroma which is inflamed and focally fibrotic. While an invasive component cannot be ruled out, the limited stroma present hampers evaluate for invasion    04/27/2018 Pathology Results    Vagina, biopsy, posterior - SUPERFICIAL FRAGMENTS OF EXTENSIVE SQUAMOUS CELL CARCINOMA IN SITU. - SEE NOTE. Diagnosis Note Though focally suspicious, definitive evidence of invasive carcinoma is not seen in the submitted biopsies. The submitted fragments are superficial in nature and a deeper, more severe process cannot be ruled out. Dr Tresa Moore has reviewed this case and concurs with the above interpretation.    06/01/2018 Pathology Results    Uterus +/- tubes/ovaries, neoplastic, upper 1/3 of vagina - INVASIVE MODERATELY DIFFERENTIATED SQUAMOUS CELL CARCINOMA, 3.0 CM, INVOLVING POSTERIOR WALL OF UPPER THIRD OF VAGINA. - CARCINOMA INVOLVES THE CAUTERIZED DEEP RESECTION MARGIN; MUCOSAL MARGINS ARE NOT INVOLVED. - LYMPHOVASCULAR INVASION IS PRESENT. - UTERUS WITH BENIGN PROLIFERATIVE ENDOMETRIUM. - BENIGN UNREMARKABLE CERVIX. - BENIGN UNREMARKABLE BILATERAL FALLOPIAN TUBE. - SEE ONCOLOGY TABLE. - SEE NOTE. Microscopic Comment VAGINA: Resection Procedure: Partial vaginectomy with total hysterectomy and bilateral salpingectomy. Tumor Site: Posterior wall of upper third of vagina. Tumor Size: 3.0 cm. Histologic Type: Squamous cell carcinoma. Histologic Grade: G2: Moderately differentiated. Other Tissue/ Organ: Uterus and bilateral fallopian tubes are not involved by carcinoma. Margins: Deep cauterized margin is positive for carcinoma. Lymphovascular Invasion:  Present. Regional Lymph Nodes: No lymph nodes submitted or found. Number of Lymph Nodes Examined: 0. Pathologic Stage Classification (pTNM, AJCC 8th Edition): pT1b, pNX. Representative Tumor Block: 1C.  Lesion is HPV positive    06/01/2018 Surgery    Pre-operative Diagnosis: history of cervical cancer, VAIN III of upper posterior vagina  Post-operative Diagnosis: same  Operation: Robotic-assisted laparoscopic total hysterectomy with bilateral salpingectomy with upper vaginectomy and oophoropexy  Surgeon: Donaciano Eva  Operative Findings:  : 6cm uterus with normal tubes and ovaries (benign appearing functional cyst on left ovary measuring 2cm). Cerclage (merseline) in place with knot anteriorally. Bladder flap adhesions. 3x3cm discoid raised erythematous lesion on posterior upper wall of vagina with close proximity to cervix. No visible lesion remaining in vagina at completion of procedure.     06/22/2018 Imaging    Ct imaging 1. Status post total abdominal hysterectomy. No definitive findings to strongly suggest metastatic disease to the chest, abdomen or pelvis. 2. 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 Further evaluation with nonemergent MRI of the abdomen with and without IV gadolinium is strongly recommended to provide definitive characterization of this lesion in the near future. 3. Two small pulmonary nodules measuring 5 mm or less in size, nonspecific but statistically likely benign. Attention on routine follow-up studies is recommended to ensure their stability. 4. 4 mm nonobstructive calculus in the upper pole collecting system of the right kidney. No ureteral stones or findings of urinary tract obstruction are noted at this time    07/02/2018 PET scan    1. Hypermetabolic right internal iliac lymph nodes, consistent with metastatic disease. 2. Hypermetabolic cervical lymph nodes bilaterally. This  would be atypical metastatic spread for the reported history of vaginal cancer. The patient does have diffuse, but symmetric hypermetabolic uptake in the nasopharynx and hypopharynx. Infectious/inflammatory etiology  a consideration although neoplasm not excluded. 3. Hypermetabolic FDG accumulation in the right ovary, nonspecific with no gross mass lesion evident. Left ovary is high in the left pelvis with low level FDG uptake. 4. Tiny bilateral pulmonary nodules as seen on previous diagnostic CT. These are below threshold size for reliable resolution on PET imaging.     07/08/2018 Cancer Staging    Staging form: Vagina, AJCC 7th Edition - Pathologic: Stage III (T1, N1, cM0) - Signed by Heath Lark, MD on 07/08/2018    07/12/2018 Procedure    Technically successful right IJ power-injectable port catheter placement. Ready for routine use.    07/16/2018 -  Chemotherapy    She received weekly cisplatin     REVIEW OF SYSTEMS:   Constitutional: Denies fevers, chills or abnormal weight loss Eyes: Denies blurriness of vision Ears, nose, mouth, throat, and face: Denies mucositis or sore throat Respiratory: Denies cough, dyspnea or wheezes Cardiovascular: Denies palpitation, chest discomfort or lower extremity swelling Gastrointestinal:  Denies nausea, heartburn or change in bowel habits Skin: Denies abnormal skin rashes Lymphatics: Denies new lymphadenopathy or easy bruising Neurological:Denies numbness, tingling or new weaknesses Behavioral/Psych: Mood is stable, no new changes  All other systems were reviewed with the patient and are negative.  I have reviewed the past medical history, past surgical history, social history and family history with the patient and they are unchanged from previous note.  ALLERGIES:  is allergic to sulfa antibiotics; adhesive [tape]; other; relpax [eletriptan hydrobromide]; and ultram [tramadol].  MEDICATIONS:  Current Outpatient Medications  Medication Sig  Dispense Refill  . albuterol (PROVENTIL HFA;VENTOLIN HFA) 108 (90 Base) MCG/ACT inhaler Inhale 2 puffs into the lungs every 6 (six) hours as needed for wheezing or shortness of breath. 1 Inhaler 1  . ALPRAZolam (XANAX) 0.5 MG tablet Take 1 tablet (0.5 mg total) by mouth at bedtime as needed for anxiety. 30 tablet 3  . butalbital-acetaminophen-caffeine (FIORICET, ESGIC) 50-325-40 MG tablet Take 1-2 tablets by mouth every 4 (four) hours as needed for migraine.     Marland Kitchen ibuprofen (ADVIL,MOTRIN) 800 MG tablet Take 1 tablet (800 mg total) by mouth every 8 (eight) hours as needed. 30 tablet 0  . LamoTRIgine (LAMICTAL XR) 200 MG TB24 24 hour tablet Take 2 tablets (400 mg total) by mouth 2 (two) times daily. (Patient taking differently: Take 400 mg by mouth 2 (two) times daily. Takes 1000 and 2200) 360 tablet 4  . LamoTRIgine 100 MG TB24 24 hour tablet Take 1 tablet (100 mg total) by mouth at bedtime. 30 tablet 11  . levETIRAcetam (KEPPRA) 750 MG tablet Take 2 tablets (1,500 mg total) by mouth 2 (two) times daily. 360 tablet 4  . lidocaine-prilocaine (EMLA) cream Apply to affected area once 30 g 3  . loratadine (CLARITIN) 10 MG tablet Take 10 mg by mouth every morning.     . montelukast (SINGULAIR) 10 MG tablet TAKE 1 TABLET BY MOUTH EVERYDAY AT BEDTIME 30 tablet 0  . Multiple Vitamins-Minerals (CENTRUM WOMEN) TABS Take 1 tablet by mouth daily.    . ondansetron (ZOFRAN) 8 MG tablet Take 1 tablet (8 mg total) by mouth 2 (two) times daily as needed. Start on the third day after chemotherapy. 30 tablet 1  . prochlorperazine (COMPAZINE) 10 MG tablet Take 1 tablet (10 mg total) by mouth every 6 (six) hours as needed (Nausea or vomiting). 30 tablet 1  . SYNTHROID 150 MCG tablet Take 1 tablet (150 mcg total) by mouth daily before breakfast. 90  tablet 3   No current facility-administered medications for this visit.     PHYSICAL EXAMINATION: ECOG PERFORMANCE STATUS: 1 - Symptomatic but completely  ambulatory  Vitals:   08/12/18 1023  BP: 102/77  Pulse: 89  Resp: 18  Temp: 98.4 F (36.9 C)  SpO2: 100%   Filed Weights   08/12/18 1023  Weight: 152 lb 9.6 oz (69.2 kg)    GENERAL:alert, no distress and comfortable SKIN: skin color, texture, turgor are normal, no rashes or significant lesions EYES: normal, Conjunctiva are pink and non-injected, sclera clear OROPHARYNX:no exudate, no erythema and lips, buccal mucosa, and tongue normal  NECK: supple, thyroid normal size, non-tender, without nodularity LYMPH:  no palpable lymphadenopathy in the cervical, axillary or inguinal LUNGS: clear to auscultation and percussion with normal breathing effort HEART: regular rate & rhythm and no murmurs and no lower extremity edema ABDOMEN:abdomen soft, non-tender and normal bowel sounds Musculoskeletal:no cyanosis of digits and no clubbing  NEURO: alert & oriented x 3 with fluent speech, no focal motor/sensory deficits  LABORATORY DATA:  I have reviewed the data as listed    Component Value Date/Time   NA 140 08/11/2018 1029   K 3.9 08/11/2018 1029   CL 106 08/11/2018 1029   CO2 25 08/11/2018 1029   GLUCOSE 101 (H) 08/11/2018 1029   BUN 8 08/11/2018 1029   CREATININE 0.70 08/11/2018 1029   CALCIUM 9.3 08/11/2018 1029   PROT 7.3 08/11/2018 1029   ALBUMIN 4.1 08/11/2018 1029   AST 16 08/11/2018 1029   ALT 22 08/11/2018 1029   ALKPHOS 87 08/11/2018 1029   BILITOT 0.5 08/11/2018 1029   GFRNONAA >60 08/11/2018 1029   GFRAA >60 08/11/2018 1029    No results found for: SPEP, UPEP  Lab Results  Component Value Date   WBC 2.7 (L) 08/11/2018   NEUTROABS 1.5 (L) 08/11/2018   HGB 12.7 08/11/2018   HCT 39.5 08/11/2018   MCV 81.4 08/11/2018   PLT 121 (L) 08/11/2018      Chemistry      Component Value Date/Time   NA 140 08/11/2018 1029   K 3.9 08/11/2018 1029   CL 106 08/11/2018 1029   CO2 25 08/11/2018 1029   BUN 8 08/11/2018 1029   CREATININE 0.70 08/11/2018 1029       Component Value Date/Time   CALCIUM 9.3 08/11/2018 1029   ALKPHOS 87 08/11/2018 1029   AST 16 08/11/2018 1029   ALT 22 08/11/2018 1029   BILITOT 0.5 08/11/2018 1029     All questions were answered. The patient knows to call the clinic with any problems, questions or concerns. No barriers to learning was detected.  I spent 15 minutes counseling the patient face to face. The total time spent in the appointment was 20 minutes and more than 50% was on counseling and review of test results  Heath Lark, MD 08/12/2018 10:52 AM

## 2018-08-13 ENCOUNTER — Inpatient Hospital Stay: Payer: 59

## 2018-08-13 ENCOUNTER — Telehealth: Payer: Self-pay | Admitting: Hematology and Oncology

## 2018-08-13 ENCOUNTER — Ambulatory Visit
Admission: RE | Admit: 2018-08-13 | Discharge: 2018-08-13 | Disposition: A | Discharge status: Home / Self Care | Payer: 59 | Source: Ambulatory Visit | Attending: Radiation Oncology | Admitting: Radiation Oncology

## 2018-08-13 VITALS — BP 116/77 | HR 94 | Temp 98.6°F | Resp 16

## 2018-08-13 DIAGNOSIS — C52 Malignant neoplasm of vagina: Secondary | ICD-10-CM

## 2018-08-13 MED ORDER — SODIUM CHLORIDE 0.9 % IV SOLN
Freq: Once | INTRAVENOUS | Status: AC
  Administered 2018-08-13: 11:00:00 via INTRAVENOUS
  Filled 2018-08-13: qty 5

## 2018-08-13 MED ORDER — PALONOSETRON HCL INJECTION 0.25 MG/5ML
INTRAVENOUS | Status: AC
  Filled 2018-08-13: qty 5

## 2018-08-13 MED ORDER — PALONOSETRON HCL INJECTION 0.25 MG/5ML
0.2500 mg | Freq: Once | INTRAVENOUS | Status: AC
  Administered 2018-08-13: 0.25 mg via INTRAVENOUS

## 2018-08-13 MED ORDER — SODIUM CHLORIDE 0.9% FLUSH
10.0000 mL | INTRAVENOUS | Status: DC | PRN
  Administered 2018-08-13: 10 mL
  Filled 2018-08-13: qty 10

## 2018-08-13 MED ORDER — SODIUM CHLORIDE 0.9 % IV SOLN
Freq: Once | INTRAVENOUS | Status: AC
Start: 2018-08-13 — End: 2018-08-13
  Administered 2018-08-13: 08:00:00 via INTRAVENOUS
  Filled 2018-08-13: qty 250

## 2018-08-13 MED ORDER — ACETAMINOPHEN 325 MG PO TABS
ORAL_TABLET | ORAL | Status: AC
  Filled 2018-08-13: qty 2

## 2018-08-13 MED ORDER — HEPARIN SOD (PORK) LOCK FLUSH 100 UNIT/ML IV SOLN
500.0000 [IU] | Freq: Once | INTRAVENOUS | Status: AC | PRN
  Administered 2018-08-13: 500 [IU]
  Filled 2018-08-13: qty 5

## 2018-08-13 MED ORDER — SODIUM CHLORIDE 0.9 % IV SOLN
40.0000 mg/m2 | Freq: Once | INTRAVENOUS | Status: AC
  Administered 2018-08-13: 72 mg via INTRAVENOUS
  Filled 2018-08-13: qty 72

## 2018-08-13 MED ORDER — POTASSIUM CHLORIDE 2 MEQ/ML IV SOLN
Freq: Once | INTRAVENOUS | Status: AC
  Administered 2018-08-13: 09:00:00 via INTRAVENOUS
  Filled 2018-08-13: qty 10

## 2018-08-13 MED ORDER — ACETAMINOPHEN 325 MG PO TABS
650.0000 mg | ORAL_TABLET | Freq: Once | ORAL | Status: AC
  Administered 2018-08-13: 650 mg via ORAL

## 2018-08-13 NOTE — Patient Instructions (Signed)
[facility] Discharge Instructions for Patients Receiving Chemotherapy  Today you received the following chemotherapy agents Cisplatin  To help prevent nausea and vomiting after your treatment, we encourage you to take your nausea medication as directed  If you develop nausea and vomiting that is not controlled by your nausea medication, call the clinic.   BELOW ARE SYMPTOMS THAT SHOULD BE REPORTED IMMEDIATELY:  *FEVER GREATER THAN 100.5 F  *CHILLS WITH OR WITHOUT FEVER  NAUSEA AND VOMITING THAT IS NOT CONTROLLED WITH YOUR NAUSEA MEDICATION  *UNUSUAL SHORTNESS OF BREATH  *UNUSUAL BRUISING OR BLEEDING  TENDERNESS IN MOUTH AND THROAT WITH OR WITHOUT PRESENCE OF ULCERS  *URINARY PROBLEMS  *BOWEL PROBLEMS  UNUSUAL RASH Items with * indicate a potential emergency and should be followed up as soon as possible.  Feel free to call the clinic should you have any questions or concerns. The clinic phone number is [phone].  Please show the CHEMO ALERT CARD at check-in to the Emergency Department and triage nurse.   

## 2018-08-13 NOTE — Telephone Encounter (Signed)
Per 1/9 no los °

## 2018-08-13 NOTE — Progress Notes (Signed)
Patient c/o headache.  Rated pain 5/10. Verbal orders given by Dr. Alvy Bimler for Tylenol 650mg  and encourage small amount of caffeine.

## 2018-08-16 ENCOUNTER — Ambulatory Visit
Admission: RE | Admit: 2018-08-16 | Discharge: 2018-08-16 | Disposition: A | Discharge status: Home / Self Care | Payer: 59 | Source: Ambulatory Visit | Attending: Radiation Oncology | Admitting: Radiation Oncology

## 2018-08-16 ENCOUNTER — Encounter: Payer: Self-pay | Admitting: Hematology and Oncology

## 2018-08-16 DIAGNOSIS — C52 Malignant neoplasm of vagina: Secondary | ICD-10-CM | POA: Diagnosis not present

## 2018-08-17 ENCOUNTER — Ambulatory Visit
Admission: RE | Admit: 2018-08-17 | Discharge: 2018-08-17 | Disposition: A | Discharge status: Home / Self Care | Payer: 59 | Source: Ambulatory Visit | Attending: Radiation Oncology | Admitting: Radiation Oncology

## 2018-08-17 DIAGNOSIS — C52 Malignant neoplasm of vagina: Secondary | ICD-10-CM | POA: Diagnosis not present

## 2018-08-18 ENCOUNTER — Inpatient Hospital Stay: Payer: 59

## 2018-08-18 ENCOUNTER — Ambulatory Visit
Admission: RE | Admit: 2018-08-18 | Discharge: 2018-08-18 | Disposition: A | Discharge status: Home / Self Care | Payer: 59 | Source: Ambulatory Visit | Attending: Radiation Oncology | Admitting: Radiation Oncology

## 2018-08-18 ENCOUNTER — Telehealth: Payer: Self-pay | Admitting: *Deleted

## 2018-08-18 DIAGNOSIS — C52 Malignant neoplasm of vagina: Secondary | ICD-10-CM | POA: Diagnosis not present

## 2018-08-18 LAB — CBC WITH DIFFERENTIAL/PLATELET
Abs Immature Granulocytes: 0 10*3/uL (ref 0.00–0.07)
BASOS ABS: 0 10*3/uL (ref 0.0–0.1)
Basophils Relative: 1 %
EOS PCT: 5 %
Eosinophils Absolute: 0.1 10*3/uL (ref 0.0–0.5)
HEMATOCRIT: 35.5 % — AB (ref 36.0–46.0)
HEMOGLOBIN: 11.8 g/dL — AB (ref 12.0–15.0)
Immature Granulocytes: 0 %
LYMPHS PCT: 26 %
Lymphs Abs: 0.7 10*3/uL (ref 0.7–4.0)
MCH: 27 pg (ref 26.0–34.0)
MCHC: 33.2 g/dL (ref 30.0–36.0)
MCV: 81.2 fL (ref 80.0–100.0)
Monocytes Absolute: 0.3 10*3/uL (ref 0.1–1.0)
Monocytes Relative: 11 %
Neutro Abs: 1.6 10*3/uL — ABNORMAL LOW (ref 1.7–7.7)
Neutrophils Relative %: 57 %
Platelets: 104 10*3/uL — ABNORMAL LOW (ref 150–400)
RBC: 4.37 MIL/uL (ref 3.87–5.11)
RDW: 15.8 % — ABNORMAL HIGH (ref 11.5–15.5)
WBC: 2.8 10*3/uL — ABNORMAL LOW (ref 4.0–10.5)
nRBC: 0 % (ref 0.0–0.2)

## 2018-08-18 LAB — COMPREHENSIVE METABOLIC PANEL
ALT: 19 U/L (ref 0–44)
AST: 14 U/L — ABNORMAL LOW (ref 15–41)
Albumin: 3.9 g/dL (ref 3.5–5.0)
Alkaline Phosphatase: 92 U/L (ref 38–126)
Anion gap: 9 (ref 5–15)
BUN: 10 mg/dL (ref 6–20)
CALCIUM: 9.2 mg/dL (ref 8.9–10.3)
CO2: 27 mmol/L (ref 22–32)
CREATININE: 0.67 mg/dL (ref 0.44–1.00)
Chloride: 105 mmol/L (ref 98–111)
GFR calc Af Amer: >60 mL/min (ref >60)
GFR calc non Af Amer: >60 mL/min (ref >60)
Glucose, Bld: 85 mg/dL (ref 70–99)
Potassium: 4 mmol/L (ref 3.5–5.1)
Sodium: 141 mmol/L (ref 135–145)
Total Bilirubin: 0.5 mg/dL (ref 0.3–1.2)
Total Protein: 7 g/dL (ref 6.5–8.1)

## 2018-08-18 LAB — MAGNESIUM: Magnesium: 1.8 mg/dL (ref 1.7–2.4)

## 2018-08-18 MED ORDER — SODIUM CHLORIDE 0.9% FLUSH
10.0000 mL | Freq: Once | INTRAVENOUS | Status: AC
  Administered 2018-08-18: 10 mL
  Filled 2018-08-18: qty 10

## 2018-08-18 NOTE — Telephone Encounter (Signed)
Called patient to inform of New [facility], lvm for a return call 

## 2018-08-19 ENCOUNTER — Ambulatory Visit
Admission: RE | Admit: 2018-08-19 | Discharge: 2018-08-19 | Disposition: A | Discharge status: Home / Self Care | Payer: 59 | Source: Ambulatory Visit | Attending: Radiation Oncology | Admitting: Radiation Oncology

## 2018-08-19 ENCOUNTER — Encounter: Payer: Self-pay | Admitting: Hematology and Oncology

## 2018-08-19 ENCOUNTER — Telehealth: Payer: Self-pay | Admitting: Hematology and Oncology

## 2018-08-19 ENCOUNTER — Inpatient Hospital Stay (HOSPITAL_BASED_OUTPATIENT_CLINIC_OR_DEPARTMENT_OTHER): Payer: 59 | Admitting: Hematology and Oncology

## 2018-08-19 DIAGNOSIS — C52 Malignant neoplasm of vagina: Secondary | ICD-10-CM | POA: Diagnosis not present

## 2018-08-19 DIAGNOSIS — D61818 Other pancytopenia: Secondary | ICD-10-CM

## 2018-08-19 DIAGNOSIS — J069 Acute upper respiratory infection, unspecified: Secondary | ICD-10-CM

## 2018-08-19 DIAGNOSIS — R197 Diarrhea, unspecified: Secondary | ICD-10-CM | POA: Diagnosis not present

## 2018-08-19 NOTE — Assessment & Plan Note (Signed)
She has severe diarrhea due to side effects of treatment I recommend Imodium and increase fluid hydration as tolerated.

## 2018-08-19 NOTE — Assessment & Plan Note (Signed)
She has signs and symptoms of URI I recommend over-the-counter remedies only She does not have anything to suggest bacterial infection

## 2018-08-19 NOTE — Telephone Encounter (Signed)
Gave avs and calendar waiting for approval for 1/24

## 2018-08-19 NOTE — Assessment & Plan Note (Addendum)
So far, she tolerated treatment well with minor side effects such as mild pancytopenia, nausea, diarrhea and fatigue along with altered taste sensation She has completed 5 weekly dose of cisplatin. She has significant progressive pancytopenia We discussed the risk and benefits of discontinuation of chemotherapy Due to recent viral illness, she is in agreement to hold chemotherapy this week and we will recheck her blood and reschedule for last dose of chemotherapy next week We will continue port flushes and blood count monitoring and plan to repeat PET CT scan 3 months after completion of radiation therapy

## 2018-08-19 NOTE — Progress Notes (Signed)
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 She has significant progressive pancytopenia We discussed the risk and benefits of discontinuation of chemotherapy Due to recent viral illness, she is in agreement to hold chemotherapy this week and we will recheck her blood and reschedule for last dose of chemotherapy next week We will continue port flushes and blood count monitoring and plan to repeat PET CT scan 3 months after completion of radiation therapy  Pancytopenia, acquired ([place]) Due to side effects of treatment.  She is not symptomatic except for mild fatigue.  We will monitor closely.  Diarrhea She has severe diarrhea due to side effects of treatment I recommend Imodium and increase fluid hydration as tolerated.  URI (upper respiratory infection) She has signs and symptoms of URI I recommend over-the-counter remedies only She does not have anything to suggest bacterial infection   No orders of the defined types were placed in this encounter.   INTERVAL HISTORY: Please see below for problem oriented charting. She returns with her mother for further follow-up She tolerated last cycle of treatment well except for persistent fatigue, diarrhea and mild nausea She has some sinus congestion and mild cough due to nasal drip but denies fever or chills She uses Imodium as needed for diarrhea and antiemetics as needed for nausea The patient denies any recent signs or symptoms of bleeding such as spontaneous epistaxis, hematuria or hematochezia.   SUMMARY OF ONCOLOGIC HISTORY:   Vaginal cancer ([place])   03/01/2013 Surgery    OPERATIVE REPORT  PREOPERATIVE  DIAGNOSIS: High-grade dysplasia on Pap smear  POSTOPERATIVE DIAGNOSIS: Same  PROCEDURE: Exam under anesthesia, endocervical curettage, vaginal biopsies  SURGEON: Marti Sleigh, M.D SURGICAL FINDINGS: Examination under anesthesia revealed a cervix which was flush with the vaginal fornices. The cervical canal was stenotic but once entered and dilated the uterus sounded approximate 7 cm anteriorly. Apply Lugol solution to the cervix showed no lesions. 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 Endotracheal  Surgeon:Tamer Kerin Perna, MD Findings: On exam under anesthesia the cervix was closed and deviated to the left. On laparoscopy the liver, gallbladder, and appendix were normal.  The uterus was gravid and retroverted and retroflexed, both tubes and ovaries appeared normal.  Intraoperative ultrasound transabdominally and transvaginally showed a singleton intrauterine pregnancy at length consistent with 11 weeks. There was fetal cardiac activity and movements.     01/19/2018 Pathology Results    Cervical biopsy showed high grade squamous intraepithelial with severe dysplasia    02/09/2018 Pathology Results    Cervix, biopsy - SUPERFICIAL FRAGMENTS OF CERVICAL MUCOSA SHOWING AT LEAST HIGH GRADE SQUAMOUS INTRAEPITHELIAL LESION (HSIL, CIN-III). - DEFINITIVE INVASION IS NOT SEEN BUT CANNOT BE ENTIRELY RULED OUT.    02/18/2018 Pathology Results    1. Cervix, cone - BENIGN SQUAMOUS AND ENDOCERVICAL MUCOSA, SEE COMMENT. - NO  DYSPLASIA OR MALIGNANCY. 2. Endocervix, curettage - BLOOD. - NO MUCOSA PRESENT. Microscopic Comment 1. p16 is negative in the squamous epithelium    02/18/2018 Surgery    Preop Diagnosis: CIN III  Postoperative Diagnosis: same  Surgery: cold knife conization of cervix   Surgeons:  Donaciano Eva, MD Pathology: cervical cone with marking stitch at 12 o'clock, post cone ECC  Operative findings: no grossly visible exophytic lesions on ectocervix. Cervix flush with vagina.     03/11/2018 Pathology Results    Vagina, biopsy, posterior upper vaginal wall - HIGH GRADE SQUAMOUS DYSPLASIA, SEE COMMENT. Microscopic Comment There are detached squamous fragments with high grade dysplastic features. Several of the fragments have a minimal amount of attached stroma which is inflamed and focally fibrotic. While an invasive component cannot be ruled out, the limited stroma present hampers evaluate for invasion    04/27/2018 Pathology Results    Vagina, biopsy, posterior - SUPERFICIAL FRAGMENTS OF EXTENSIVE SQUAMOUS CELL CARCINOMA IN SITU. - SEE NOTE. Diagnosis Note Though focally suspicious, definitive evidence of invasive carcinoma is not seen in the submitted biopsies. The submitted fragments are superficial in nature and a deeper, more severe process cannot be ruled out. Dr Tresa Moore has reviewed this case and concurs with the above interpretation.    06/01/2018 Pathology Results    Uterus +/- tubes/ovaries, neoplastic, upper 1/3 of vagina - INVASIVE MODERATELY DIFFERENTIATED SQUAMOUS CELL CARCINOMA, 3.0 CM, INVOLVING POSTERIOR WALL OF UPPER THIRD OF VAGINA. - CARCINOMA INVOLVES THE CAUTERIZED DEEP RESECTION MARGIN; MUCOSAL MARGINS ARE NOT INVOLVED. - LYMPHOVASCULAR INVASION IS PRESENT. - UTERUS WITH BENIGN PROLIFERATIVE ENDOMETRIUM. - BENIGN UNREMARKABLE CERVIX. - BENIGN UNREMARKABLE BILATERAL FALLOPIAN TUBE. - SEE ONCOLOGY TABLE. - SEE NOTE. Microscopic Comment VAGINA:  Resection Procedure: Partial vaginectomy with total hysterectomy and bilateral salpingectomy. Tumor Site: Posterior wall of upper third of vagina. Tumor Size: 3.0 cm. Histologic Type: Squamous cell carcinoma. Histologic Grade: G2: Moderately differentiated. Other Tissue/ Organ: Uterus and bilateral fallopian tubes are not involved by carcinoma. Margins: Deep cauterized margin is positive for carcinoma. Lymphovascular Invasion: Present. Regional Lymph Nodes: No lymph nodes submitted or found. Number of Lymph Nodes Examined: 0. Pathologic Stage Classification (pTNM, AJCC 8th Edition): pT1b, pNX. Representative Tumor Block: 1C.  Lesion is HPV positive    06/01/2018 Surgery    Pre-operative Diagnosis: history of cervical cancer, VAIN III of upper posterior vagina  Post-operative Diagnosis: same  Operation: Robotic-assisted laparoscopic total hysterectomy with bilateral salpingectomy with upper vaginectomy and oophoropexy  Surgeon: Donaciano Eva  Operative Findings:  : 6cm uterus with normal tubes and ovaries (benign appearing functional cyst on left ovary measuring 2cm). Cerclage (merseline) in place with knot anteriorally. Bladder flap adhesions. 3x3cm discoid raised erythematous lesion on posterior upper wall of vagina with close proximity to cervix. No visible lesion remaining in vagina at completion of procedure.     06/22/2018 Imaging    Ct imaging 1. Status post total abdominal hysterectomy. No definitive findings to strongly suggest metastatic disease to the chest, abdomen or pelvis. 2. 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 Further evaluation with nonemergent MRI of the abdomen with and without IV gadolinium is strongly recommended to provide definitive characterization of this lesion in the near future. 3. Two small pulmonary nodules measuring 5 mm or less in size, nonspecific but statistically likely  benign. Attention on routine follow-up studies is recommended to ensure their stability. 4. 4 mm nonobstructive calculus in the upper pole  collecting system of the right kidney. No ureteral stones or findings of urinary tract obstruction are noted at this time    07/02/2018 PET scan    1. Hypermetabolic right internal iliac lymph nodes, consistent with metastatic disease. 2. Hypermetabolic cervical lymph nodes bilaterally. This would be atypical metastatic spread for the reported history of vaginal cancer. The patient does have diffuse, but symmetric hypermetabolic uptake in the nasopharynx and hypopharynx. Infectious/inflammatory etiology a consideration although neoplasm not excluded. 3. Hypermetabolic FDG accumulation in the right ovary, nonspecific with no gross mass lesion evident. Left ovary is high in the left pelvis with low level FDG uptake. 4. Tiny bilateral pulmonary nodules as seen on previous diagnostic CT. These are below threshold size for reliable resolution on PET imaging.     07/08/2018 Cancer Staging    Staging form: Vagina, AJCC 7th Edition - Pathologic: Stage III (T1, N1, cM0) - Signed by Heath Lark, MD on 07/08/2018    07/12/2018 Procedure    Technically successful right IJ power-injectable port catheter placement. Ready for routine use.    07/16/2018 -  Chemotherapy    She received weekly cisplatin     REVIEW OF SYSTEMS:   Constitutional: Denies fevers, chills or abnormal weight loss Eyes: Denies blurriness of vision Ears, nose, mouth, throat, and face: Denies mucositis  Cardiovascular: Denies palpitation, chest discomfort or lower extremity swelling Skin: Denies abnormal skin rashes Lymphatics: Denies new lymphadenopathy or easy bruising Neurological:Denies numbness, tingling or new weaknesses Behavioral/Psych: Mood is stable, no new changes  All other systems were reviewed with the patient and are negative.  I have reviewed the past medical history, past  surgical history, social history and family history with the patient and they are unchanged from previous note.  ALLERGIES:  is allergic to sulfa antibiotics; adhesive [tape]; other; relpax [eletriptan hydrobromide]; and ultram [tramadol].  MEDICATIONS:  Current Outpatient Medications  Medication Sig Dispense Refill  . albuterol (PROVENTIL HFA;VENTOLIN HFA) 108 (90 Base) MCG/ACT inhaler Inhale 2 puffs into the lungs every 6 (six) hours as needed for wheezing or shortness of breath. 1 Inhaler 1  . ALPRAZolam (XANAX) 0.5 MG tablet Take 1 tablet (0.5 mg total) by mouth at bedtime as needed for anxiety. 30 tablet 3  . butalbital-acetaminophen-caffeine (FIORICET, ESGIC) 50-325-40 MG tablet Take 1-2 tablets by mouth every 4 (four) hours as needed for migraine.     Marland Kitchen ibuprofen (ADVIL,MOTRIN) 800 MG tablet Take 1 tablet (800 mg total) by mouth every 8 (eight) hours as needed. 30 tablet 0  . LamoTRIgine (LAMICTAL XR) 200 MG TB24 24 hour tablet Take 2 tablets (400 mg total) by mouth 2 (two) times daily. (Patient taking differently: Take 400 mg by mouth 2 (two) times daily. Takes 1000 and 2200) 360 tablet 4  . LamoTRIgine 100 MG TB24 24 hour tablet Take 1 tablet (100 mg total) by mouth at bedtime. 30 tablet 11  . levETIRAcetam (KEPPRA) 750 MG tablet Take 2 tablets (1,500 mg total) by mouth 2 (two) times daily. 360 tablet 4  . lidocaine-prilocaine (EMLA) cream Apply to affected area once 30 g 3  . loratadine (CLARITIN) 10 MG tablet Take 10 mg by mouth every morning.     . montelukast (SINGULAIR) 10 MG tablet TAKE 1 TABLET BY MOUTH EVERYDAY AT BEDTIME 30 tablet 0  . Multiple Vitamins-Minerals (CENTRUM WOMEN) TABS Take 1 tablet by mouth daily.    . ondansetron (ZOFRAN) 8 MG tablet Take 1 tablet (8 mg total) by mouth 2 (two) times daily  as needed. Start on the third day after chemotherapy. 30 tablet 1  . prochlorperazine (COMPAZINE) 10 MG tablet Take 1 tablet (10 mg total) by mouth every 6 (six) hours as needed  (Nausea or vomiting). 30 tablet 1  . SYNTHROID 150 MCG tablet Take 1 tablet (150 mcg total) by mouth daily before breakfast. 90 tablet 3   No current facility-administered medications for this visit.     PHYSICAL EXAMINATION: ECOG PERFORMANCE STATUS: 1 - Symptomatic but completely ambulatory  Vitals:   08/19/18 1101  BP: 107/77  Pulse: 98  Resp: 18  Temp: 98.2 F (36.8 C)  SpO2: 98%   Filed Weights   08/19/18 1101  Weight: 151 lb (68.5 kg)    GENERAL:alert, no distress and comfortable SKIN: skin color, texture, turgor are normal, no rashes or significant lesions EYES: normal, Conjunctiva are pink and non-injected, sclera clear OROPHARYNX:no exudate, no erythema and lips, buccal mucosa, and tongue normal  NECK: supple, thyroid normal size, non-tender, without nodularity LYMPH:  no palpable lymphadenopathy in the cervical, axillary or inguinal LUNGS: clear to auscultation and percussion with normal breathing effort HEART: regular rate & rhythm and no murmurs and no lower extremity edema ABDOMEN:abdomen soft, non-tender and normal bowel sounds Musculoskeletal:no cyanosis of digits and no clubbing  NEURO: alert & oriented x 3 with fluent speech, no focal motor/sensory deficits  LABORATORY DATA:  I have reviewed the data as listed    Component Value Date/Time   NA 141 08/18/2018 1022   K 4.0 08/18/2018 1022   CL 105 08/18/2018 1022   CO2 27 08/18/2018 1022   GLUCOSE 85 08/18/2018 1022   BUN 10 08/18/2018 1022   CREATININE 0.67 08/18/2018 1022   CALCIUM 9.2 08/18/2018 1022   PROT 7.0 08/18/2018 1022   ALBUMIN 3.9 08/18/2018 1022   AST 14 (L) 08/18/2018 1022   ALT 19 08/18/2018 1022   ALKPHOS 92 08/18/2018 1022   BILITOT 0.5 08/18/2018 1022   GFRNONAA >60 08/18/2018 1022   GFRAA >60 08/18/2018 1022    No results found for: SPEP, UPEP  Lab Results  Component Value Date   WBC 2.8 (L) 08/18/2018   NEUTROABS 1.6 (L) 08/18/2018   HGB 11.8 (L) 08/18/2018   HCT 35.5  (L) 08/18/2018   MCV 81.2 08/18/2018   PLT 104 (L) 08/18/2018      Chemistry      Component Value Date/Time   NA 141 08/18/2018 1022   K 4.0 08/18/2018 1022   CL 105 08/18/2018 1022   CO2 27 08/18/2018 1022   BUN 10 08/18/2018 1022   CREATININE 0.67 08/18/2018 1022      Component Value Date/Time   CALCIUM 9.2 08/18/2018 1022   ALKPHOS 92 08/18/2018 1022   AST 14 (L) 08/18/2018 1022   ALT 19 08/18/2018 1022   BILITOT 0.5 08/18/2018 1022       All questions were answered. The patient knows to call the clinic with any problems, questions or concerns. No barriers to learning was detected.  I spent 15 minutes counseling the patient face to face. The total time spent in the appointment was 20 minutes and more than 50% was on counseling and review of test results  Heath Lark, MD 08/19/2018 11:33 AM

## 2018-08-19 NOTE — Assessment & Plan Note (Signed)
Due to side effects of treatment.  She is not symptomatic except for mild fatigue.  We will monitor closely.

## 2018-08-20 ENCOUNTER — Ambulatory Visit
Admission: RE | Admit: 2018-08-20 | Discharge: 2018-08-20 | Disposition: A | Discharge status: Home / Self Care | Payer: 59 | Source: Ambulatory Visit | Attending: Radiation Oncology | Admitting: Radiation Oncology

## 2018-08-20 ENCOUNTER — Telehealth: Payer: Self-pay | Admitting: Hematology and Oncology

## 2018-08-20 ENCOUNTER — Telehealth: Payer: Self-pay | Admitting: *Deleted

## 2018-08-20 DIAGNOSIS — C52 Malignant neoplasm of vagina: Secondary | ICD-10-CM | POA: Diagnosis not present

## 2018-08-20 NOTE — Telephone Encounter (Signed)
Tried to reach regarding 1/24 I did leave a message

## 2018-08-20 NOTE — Telephone Encounter (Signed)
CALLED PATIENT TO INFORM OF NEW HDR [facility], SPOKE WITH PATIENT AND SHE IS AWARE OF THESE APPTS. °

## 2018-08-23 ENCOUNTER — Ambulatory Visit
Admission: RE | Admit: 2018-08-23 | Discharge: 2018-08-23 | Disposition: A | Discharge status: Home / Self Care | Payer: 59 | Source: Ambulatory Visit | Attending: Radiation Oncology | Admitting: Radiation Oncology

## 2018-08-23 DIAGNOSIS — C52 Malignant neoplasm of vagina: Secondary | ICD-10-CM | POA: Diagnosis not present

## 2018-08-24 ENCOUNTER — Ambulatory Visit
Admission: RE | Admit: 2018-08-24 | Discharge: 2018-08-24 | Disposition: A | Discharge status: Home / Self Care | Payer: 59 | Source: Ambulatory Visit | Attending: Radiation Oncology | Admitting: Radiation Oncology

## 2018-08-24 DIAGNOSIS — C52 Malignant neoplasm of vagina: Secondary | ICD-10-CM | POA: Diagnosis not present

## 2018-08-25 ENCOUNTER — Ambulatory Visit
Admission: RE | Admit: 2018-08-25 | Discharge: 2018-08-25 | Disposition: A | Discharge status: Home / Self Care | Payer: 59 | Source: Ambulatory Visit | Attending: Radiation Oncology | Admitting: Radiation Oncology

## 2018-08-25 ENCOUNTER — Encounter: Payer: Self-pay | Admitting: Hematology and Oncology

## 2018-08-25 ENCOUNTER — Inpatient Hospital Stay: Payer: 59

## 2018-08-25 DIAGNOSIS — C52 Malignant neoplasm of vagina: Secondary | ICD-10-CM | POA: Diagnosis not present

## 2018-08-25 LAB — COMPREHENSIVE METABOLIC PANEL
ALT: 16 U/L (ref 0–44)
AST: 14 U/L — ABNORMAL LOW (ref 15–41)
Albumin: 4.1 g/dL (ref 3.5–5.0)
Alkaline Phosphatase: 87 U/L (ref 38–126)
Anion gap: 11 (ref 5–15)
BUN: 9 mg/dL (ref 6–20)
CO2: 24 mmol/L (ref 22–32)
Calcium: 9.2 mg/dL (ref 8.9–10.3)
Chloride: 106 mmol/L (ref 98–111)
Creatinine, Ser: 0.7 mg/dL (ref 0.44–1.00)
GFR calc Af Amer: >60 mL/min (ref >60)
Glucose, Bld: 86 mg/dL (ref 70–99)
Potassium: 4.1 mmol/L (ref 3.5–5.1)
Sodium: 141 mmol/L (ref 135–145)
Total Bilirubin: 0.5 mg/dL (ref 0.3–1.2)
Total Protein: 7.1 g/dL (ref 6.5–8.1)

## 2018-08-25 LAB — CBC WITH DIFFERENTIAL/PLATELET
ABS IMMATURE GRANULOCYTES: 0 10*3/uL (ref 0.00–0.07)
Basophils Absolute: 0 10*3/uL (ref 0.0–0.1)
Basophils Relative: 0 %
Eosinophils Absolute: 0.1 10*3/uL (ref 0.0–0.5)
Eosinophils Relative: 5 %
HCT: 35 % — ABNORMAL LOW (ref 36.0–46.0)
HEMOGLOBIN: 11.5 g/dL — AB (ref 12.0–15.0)
IMMATURE GRANULOCYTES: 0 %
Lymphocytes Relative: 33 %
Lymphs Abs: 0.8 10*3/uL (ref 0.7–4.0)
MCH: 27.4 pg (ref 26.0–34.0)
MCHC: 32.9 g/dL (ref 30.0–36.0)
MCV: 83.3 fL (ref 80.0–100.0)
Monocytes Absolute: 0.3 10*3/uL (ref 0.1–1.0)
Monocytes Relative: 10 %
NEUTROS ABS: 1.3 10*3/uL — AB (ref 1.7–7.7)
NEUTROS PCT: 52 %
Platelets: 134 10*3/uL — ABNORMAL LOW (ref 150–400)
RBC: 4.2 MIL/uL (ref 3.87–5.11)
RDW: 17.8 % — ABNORMAL HIGH (ref 11.5–15.5)
WBC: 2.5 10*3/uL — ABNORMAL LOW (ref 4.0–10.5)
nRBC: 0 % (ref 0.0–0.2)

## 2018-08-25 LAB — MAGNESIUM: Magnesium: 1.8 mg/dL (ref 1.7–2.4)

## 2018-08-25 MED ORDER — SODIUM CHLORIDE 0.9% FLUSH
10.0000 mL | Freq: Once | INTRAVENOUS | Status: AC
  Administered 2018-08-25: 10 mL
  Filled 2018-08-25: qty 10

## 2018-08-25 MED ORDER — HEPARIN SOD (PORK) LOCK FLUSH 100 UNIT/ML IV SOLN
250.0000 [IU] | Freq: Once | INTRAVENOUS | Status: AC
  Administered 2018-08-25: 250 [IU]
  Filled 2018-08-25: qty 5

## 2018-08-25 NOTE — Progress Notes (Signed)
Patient walked in to inquire about [employer] we discussed a mont ago.  She brought proof of income for grant. Patient approved for one-time $1000 grant. She has a copy of the approval letter as well as the expense sheet along with the Outpatient pharmacy information. She received a gas card today from her grant.  She has my card for any additional financial questions or concerns.

## 2018-08-26 ENCOUNTER — Ambulatory Visit
Admission: RE | Admit: 2018-08-26 | Discharge: 2018-08-26 | Disposition: A | Discharge status: Home / Self Care | Payer: 59 | Source: Ambulatory Visit | Attending: Radiation Oncology | Admitting: Radiation Oncology

## 2018-08-26 ENCOUNTER — Encounter: Payer: Self-pay | Admitting: Hematology and Oncology

## 2018-08-26 ENCOUNTER — Inpatient Hospital Stay (HOSPITAL_BASED_OUTPATIENT_CLINIC_OR_DEPARTMENT_OTHER): Payer: 59 | Admitting: Hematology and Oncology

## 2018-08-26 DIAGNOSIS — D61818 Other pancytopenia: Secondary | ICD-10-CM

## 2018-08-26 DIAGNOSIS — R110 Nausea: Secondary | ICD-10-CM

## 2018-08-26 DIAGNOSIS — C52 Malignant neoplasm of vagina: Secondary | ICD-10-CM | POA: Diagnosis not present

## 2018-08-26 NOTE — Telephone Encounter (Signed)
Gave AVS and calendar °

## 2018-08-27 ENCOUNTER — Ambulatory Visit
Admission: RE | Admit: 2018-08-27 | Discharge: 2018-08-27 | Disposition: A | Discharge status: Home / Self Care | Payer: 59 | Source: Ambulatory Visit | Attending: Radiation Oncology | Admitting: Radiation Oncology

## 2018-08-27 ENCOUNTER — Inpatient Hospital Stay: Payer: 59

## 2018-08-27 ENCOUNTER — Encounter: Payer: Self-pay | Admitting: Hematology and Oncology

## 2018-08-27 VITALS — BP 107/78 | HR 90 | Temp 98.7°F | Resp 18

## 2018-08-27 DIAGNOSIS — C52 Malignant neoplasm of vagina: Secondary | ICD-10-CM | POA: Diagnosis not present

## 2018-08-27 MED ORDER — PALONOSETRON HCL INJECTION 0.25 MG/5ML
0.2500 mg | Freq: Once | INTRAVENOUS | Status: AC
  Administered 2018-08-27: 0.25 mg via INTRAVENOUS

## 2018-08-27 MED ORDER — SODIUM CHLORIDE 0.9 % IV SOLN
Freq: Once | INTRAVENOUS | Status: AC
  Administered 2018-08-27: 09:00:00 via INTRAVENOUS
  Filled 2018-08-27: qty 250

## 2018-08-27 MED ORDER — POTASSIUM CHLORIDE 2 MEQ/ML IV SOLN
Freq: Once | INTRAVENOUS | Status: AC
  Administered 2018-08-27: 09:00:00 via INTRAVENOUS
  Filled 2018-08-27: qty 10

## 2018-08-27 MED ORDER — PALONOSETRON HCL INJECTION 0.25 MG/5ML
INTRAVENOUS | Status: AC
  Filled 2018-08-27: qty 5

## 2018-08-27 MED ORDER — SODIUM CHLORIDE 0.9 % IV SOLN
Freq: Once | INTRAVENOUS | Status: AC
  Administered 2018-08-27: 12:00:00 via INTRAVENOUS
  Filled 2018-08-27: qty 5

## 2018-08-27 MED ORDER — SODIUM CHLORIDE 0.9 % IV SOLN
40.0000 mg/m2 | Freq: Once | INTRAVENOUS | Status: AC
  Administered 2018-08-27: 72 mg via INTRAVENOUS
  Filled 2018-08-27: qty 72

## 2018-08-27 MED ORDER — SODIUM CHLORIDE 0.9% FLUSH
10.0000 mL | INTRAVENOUS | Status: DC | PRN
  Administered 2018-08-27: 10 mL
  Filled 2018-08-27: qty 10

## 2018-08-27 MED ORDER — HEPARIN SOD (PORK) LOCK FLUSH 100 UNIT/ML IV SOLN
500.0000 [IU] | Freq: Once | INTRAVENOUS | Status: AC | PRN
  Administered 2018-08-27: 500 [IU]
  Filled 2018-08-27: qty 5

## 2018-08-27 NOTE — Assessment & Plan Note (Signed)
Her most recent URI had resolved She feels well.  We discussed the risk and benefits of pursuing final treatment as scheduled and she would like to proceed I have scheduled lab flushes appointment for the patient to maintain port patency. She is aware that I will be ordering PET CT scan within 3 months of finishing radiation therapy for objective assessment of response to therapy.

## 2018-08-27 NOTE — Progress Notes (Signed)
Per Dr. Alvy Bimler, okay to treat with anc of 1.3  Per Dr. Alvy Bimler okay to go ahead with treatment with only 50 mLs of urine output

## 2018-08-27 NOTE — Patient Instructions (Signed)
[facility] Discharge Instructions for Patients Receiving Chemotherapy  Today you received the following chemotherapy agents Cisplatin  To help prevent nausea and vomiting after your treatment, we encourage you to take your nausea medication as directed  If you develop nausea and vomiting that is not controlled by your nausea medication, call the clinic.   BELOW ARE SYMPTOMS THAT SHOULD BE REPORTED IMMEDIATELY:  *FEVER GREATER THAN 100.5 F  *CHILLS WITH OR WITHOUT FEVER  NAUSEA AND VOMITING THAT IS NOT CONTROLLED WITH YOUR NAUSEA MEDICATION  *UNUSUAL SHORTNESS OF BREATH  *UNUSUAL BRUISING OR BLEEDING  TENDERNESS IN MOUTH AND THROAT WITH OR WITHOUT PRESENCE OF ULCERS  *URINARY PROBLEMS  *BOWEL PROBLEMS  UNUSUAL RASH Items with * indicate a potential emergency and should be followed up as soon as possible.  Feel free to call the clinic should you have any questions or concerns. The clinic phone number is [phone].  Please show the CHEMO ALERT CARD at check-in to the Emergency Department and triage nurse.   

## 2018-08-27 NOTE — Assessment & Plan Note (Signed)
Her symptom is well controlled. She will continue antiemetics as needed.

## 2018-08-27 NOTE — Assessment & Plan Note (Signed)
She is not symptomatic. We will proceed with treatment as scheduled despite mild pancytopenia and neutropenia

## 2018-08-27 NOTE — Progress Notes (Signed)
[place] OFFICE PROGRESS NOTE  Patient Care Team: Brien Few, MD as PCP - General (Obstetrics and Gynecology)  ASSESSMENT & PLAN:  Vaginal cancer [facility]) Her most recent URI had resolved She feels well.  We discussed the risk and benefits of pursuing final treatment as scheduled and she would like to proceed I have scheduled lab flushes appointment for the patient to maintain port patency. She is aware that I will be ordering PET CT scan within 3 months of finishing radiation therapy for objective assessment of response to therapy.  Pancytopenia, acquired [facility]) She is not symptomatic. We will proceed with treatment as scheduled despite mild pancytopenia and neutropenia  Nausea without vomiting Her symptom is well controlled. She will continue antiemetics as needed.   No orders of the defined types were placed in this encounter.   INTERVAL HISTORY: Please see below for problem oriented charting. She returns for further follow-up She felt better this week Her symptoms of nasal congestion and cough has resolved She denies significant severe nausea vomiting.  Diarrhea is stable. She denies peripheral neuropathy from treatment  SUMMARY OF ONCOLOGIC HISTORY:   Vaginal cancer ([place])   03/01/2013 Surgery    OPERATIVE REPORT  PREOPERATIVE DIAGNOSIS: High-grade dysplasia on Pap smear  POSTOPERATIVE DIAGNOSIS: Same  PROCEDURE: Exam under anesthesia, endocervical curettage, vaginal biopsies  SURGEON: Marti Sleigh, M.D SURGICAL FINDINGS: Examination under anesthesia revealed a cervix which was flush with the vaginal fornices. The cervical canal was stenotic but once entered and dilated the uterus sounded approximate 7 cm anteriorly. Apply Lugol solution to the cervix showed no lesions. There were 2 areas of non-uptake of Lugol's in the posterior vagina which were biopsied    11/09/2016 Surgery    Preoperative diagnosis: Short cervix S/P cervical  conization for microinvasive cervical cancer, intrauterine pregnancy at 11 weeks  Postoperative diagnosis: Short cervix S/P cervical conization for microinvasive cervical cancer, intrauterine pregnancy at 11 weeks  Procedure: Laparoscopy, transabdominal cervical isthmic cerclage x 2, intraoperative ultrasound guidance  Anesthesia: Gen. Endotracheal  Surgeon:Tamer Kerin Perna, MD Findings: On exam under anesthesia the cervix was closed and deviated to the left. On laparoscopy the liver, gallbladder, and appendix were normal.  The uterus was gravid and retroverted and retroflexed, both tubes and ovaries appeared normal.  Intraoperative ultrasound transabdominally and transvaginally showed a singleton intrauterine pregnancy at length consistent with 11 weeks. There was fetal cardiac activity and movements.     01/19/2018 Pathology Results    Cervical biopsy showed high grade squamous intraepithelial with severe dysplasia    02/09/2018 Pathology Results    Cervix, biopsy - SUPERFICIAL FRAGMENTS OF CERVICAL MUCOSA SHOWING AT LEAST HIGH GRADE SQUAMOUS INTRAEPITHELIAL LESION (HSIL, CIN-III). - DEFINITIVE INVASION IS NOT SEEN BUT CANNOT BE ENTIRELY RULED OUT.    02/18/2018 Pathology Results    1. Cervix, cone - BENIGN SQUAMOUS AND ENDOCERVICAL MUCOSA, SEE COMMENT. - NO DYSPLASIA OR MALIGNANCY. 2. Endocervix, curettage - BLOOD. - NO MUCOSA PRESENT. Microscopic Comment 1. p16 is negative in the squamous epithelium    02/18/2018 Surgery    Preop Diagnosis: CIN III  Postoperative Diagnosis: same  Surgery: cold knife conization of cervix   Surgeons:  Donaciano Eva, MD Pathology: cervical cone with marking stitch at 12 o'clock, post cone ECC  Operative findings: no grossly visible exophytic lesions on ectocervix. Cervix flush with vagina.     03/11/2018 Pathology Results    Vagina, biopsy, posterior upper vaginal wall - HIGH GRADE SQUAMOUS DYSPLASIA, SEE  COMMENT. Microscopic Comment  There are detached squamous fragments with high grade dysplastic features. Several of the fragments have a minimal amount of attached stroma which is inflamed and focally fibrotic. While an invasive component cannot be ruled out, the limited stroma present hampers evaluate for invasion    04/27/2018 Pathology Results    Vagina, biopsy, posterior - SUPERFICIAL FRAGMENTS OF EXTENSIVE SQUAMOUS CELL CARCINOMA IN SITU. - SEE NOTE. Diagnosis Note Though focally suspicious, definitive evidence of invasive carcinoma is not seen in the submitted biopsies. The submitted fragments are superficial in nature and a deeper, more severe process cannot be ruled out. Dr Tresa Moore has reviewed this case and concurs with the above interpretation.    06/01/2018 Pathology Results    Uterus +/- tubes/ovaries, neoplastic, upper 1/3 of vagina - INVASIVE MODERATELY DIFFERENTIATED SQUAMOUS CELL CARCINOMA, 3.0 CM, INVOLVING POSTERIOR WALL OF UPPER THIRD OF VAGINA. - CARCINOMA INVOLVES THE CAUTERIZED DEEP RESECTION MARGIN; MUCOSAL MARGINS ARE NOT INVOLVED. - LYMPHOVASCULAR INVASION IS PRESENT. - UTERUS WITH BENIGN PROLIFERATIVE ENDOMETRIUM. - BENIGN UNREMARKABLE CERVIX. - BENIGN UNREMARKABLE BILATERAL FALLOPIAN TUBE. - SEE ONCOLOGY TABLE. - SEE NOTE. Microscopic Comment VAGINA: Resection Procedure: Partial vaginectomy with total hysterectomy and bilateral salpingectomy. Tumor Site: Posterior wall of upper third of vagina. Tumor Size: 3.0 cm. Histologic Type: Squamous cell carcinoma. Histologic Grade: G2: Moderately differentiated. Other Tissue/ Organ: Uterus and bilateral fallopian tubes are not involved by carcinoma. Margins: Deep cauterized margin is positive for carcinoma. Lymphovascular Invasion: Present. Regional Lymph Nodes: No lymph nodes submitted or found. Number of Lymph Nodes Examined: 0. Pathologic Stage Classification (pTNM, AJCC 8th Edition): pT1b, pNX. Representative Tumor  Block: 1C.  Lesion is HPV positive    06/01/2018 Surgery    Pre-operative Diagnosis: history of cervical cancer, VAIN III of upper posterior vagina  Post-operative Diagnosis: same  Operation: Robotic-assisted laparoscopic total hysterectomy with bilateral salpingectomy with upper vaginectomy and oophoropexy  Surgeon: Donaciano Eva  Operative Findings:  : 6cm uterus with normal tubes and ovaries (benign appearing functional cyst on left ovary measuring 2cm). Cerclage (merseline) in place with knot anteriorally. Bladder flap adhesions. 3x3cm discoid raised erythematous lesion on posterior upper wall of vagina with close proximity to cervix. No visible lesion remaining in vagina at completion of procedure.     06/22/2018 Imaging    Ct imaging 1. Status post total abdominal hysterectomy. No definitive findings to strongly suggest metastatic disease to the chest, abdomen or pelvis. 2. 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 Further evaluation with nonemergent MRI of the abdomen with and without IV gadolinium is strongly recommended to provide definitive characterization of this lesion in the near future. 3. Two small pulmonary nodules measuring 5 mm or less in size, nonspecific but statistically likely benign. Attention on routine follow-up studies is recommended to ensure their stability. 4. 4 mm nonobstructive calculus in the upper pole collecting system of the right kidney. No ureteral stones or findings of urinary tract obstruction are noted at this time    07/02/2018 PET scan    1. Hypermetabolic right internal iliac lymph nodes, consistent with metastatic disease. 2. Hypermetabolic cervical lymph nodes bilaterally. This would be atypical metastatic spread for the reported history of vaginal cancer. The patient does have diffuse, but symmetric hypermetabolic uptake in the nasopharynx and hypopharynx.  Infectious/inflammatory etiology a consideration although neoplasm not excluded. 3. Hypermetabolic FDG accumulation in the right ovary, nonspecific with no gross mass lesion evident. Left ovary is high in the left pelvis  with low level FDG uptake. 4. Tiny bilateral pulmonary nodules as seen on previous diagnostic CT. These are below threshold size for reliable resolution on PET imaging.     07/08/2018 Cancer Staging    Staging form: Vagina, AJCC 7th Edition - Pathologic: Stage III (T1, N1, cM0) - Signed by Heath Lark, MD on 07/08/2018    07/12/2018 Procedure    Technically successful right IJ power-injectable port catheter placement. Ready for routine use.    07/16/2018 -  Chemotherapy    She received weekly cisplatin     REVIEW OF SYSTEMS:   Constitutional: Denies fevers, chills or abnormal weight loss Eyes: Denies blurriness of vision Ears, nose, mouth, throat, and face: Denies mucositis or sore throat Respiratory: Denies cough, dyspnea or wheezes Cardiovascular: Denies palpitation, chest discomfort or lower extremity swelling Skin: Denies abnormal skin rashes Lymphatics: Denies new lymphadenopathy or easy bruising Neurological:Denies numbness, tingling or new weaknesses Behavioral/Psych: Mood is stable, no new changes  All other systems were reviewed with the patient and are negative.  I have reviewed the past medical history, past surgical history, social history and family history with the patient and they are unchanged from previous note.  ALLERGIES:  is allergic to sulfa antibiotics; adhesive [tape]; other; relpax [eletriptan hydrobromide]; and ultram [tramadol].  MEDICATIONS:  Current Outpatient Medications  Medication Sig Dispense Refill  . albuterol (PROVENTIL HFA;VENTOLIN HFA) 108 (90 Base) MCG/ACT inhaler Inhale 2 puffs into the lungs every 6 (six) hours as needed for wheezing or shortness of breath. 1 Inhaler 1  . ALPRAZolam (XANAX) 0.5 MG tablet Take 1 tablet (0.5 mg  total) by mouth at bedtime as needed for anxiety. 30 tablet 3  . butalbital-acetaminophen-caffeine (FIORICET, ESGIC) 50-325-40 MG tablet Take 1-2 tablets by mouth every 4 (four) hours as needed for migraine.     Marland Kitchen ibuprofen (ADVIL,MOTRIN) 800 MG tablet Take 1 tablet (800 mg total) by mouth every 8 (eight) hours as needed. 30 tablet 0  . LamoTRIgine (LAMICTAL XR) 200 MG TB24 24 hour tablet Take 2 tablets (400 mg total) by mouth 2 (two) times daily. (Patient taking differently: Take 400 mg by mouth 2 (two) times daily. Takes 1000 and 2200) 360 tablet 4  . LamoTRIgine 100 MG TB24 24 hour tablet Take 1 tablet (100 mg total) by mouth at bedtime. 30 tablet 11  . levETIRAcetam (KEPPRA) 750 MG tablet Take 2 tablets (1,500 mg total) by mouth 2 (two) times daily. 360 tablet 4  . lidocaine-prilocaine (EMLA) cream Apply to affected area once 30 g 3  . loratadine (CLARITIN) 10 MG tablet Take 10 mg by mouth every morning.     . montelukast (SINGULAIR) 10 MG tablet TAKE 1 TABLET BY MOUTH EVERYDAY AT BEDTIME 30 tablet 0  . Multiple Vitamins-Minerals (CENTRUM WOMEN) TABS Take 1 tablet by mouth daily.    . ondansetron (ZOFRAN) 8 MG tablet Take 1 tablet (8 mg total) by mouth 2 (two) times daily as needed. Start on the third day after chemotherapy. 30 tablet 1  . prochlorperazine (COMPAZINE) 10 MG tablet Take 1 tablet (10 mg total) by mouth every 6 (six) hours as needed (Nausea or vomiting). 30 tablet 1  . SYNTHROID 150 MCG tablet Take 1 tablet (150 mcg total) by mouth daily before breakfast. 90 tablet 3   No current facility-administered medications for this visit.    Facility-Administered Medications Ordered in Other Visits  Medication Dose Route Frequency Provider Last Rate Last Dose  . heparin lock flush 100 unit/mL  500  Units Intracatheter Once PRN Alvy Bimler, Debbera Wolken, MD      . sodium chloride flush (NS) 0.9 % injection 10 mL  10 mL Intracatheter PRN Alvy Bimler, Gregroy Dombkowski, MD        PHYSICAL EXAMINATION: ECOG PERFORMANCE  STATUS: 1 - Symptomatic but completely ambulatory  Vitals:   08/26/18 1121  BP: 107/87  Pulse: 100  Resp: 18  Temp: 98.2 F (36.8 C)  SpO2: 100%   Filed Weights   08/26/18 1121  Weight: 151 lb 9.6 oz (68.8 kg)    GENERAL:alert, no distress and comfortable SKIN: skin color, texture, turgor are normal, no rashes or significant lesions EYES: normal, Conjunctiva are pink and non-injected, sclera clear OROPHARYNX:no exudate, no erythema and lips, buccal mucosa, and tongue normal  NECK: supple, thyroid normal size, non-tender, without nodularity LYMPH:  no palpable lymphadenopathy in the cervical, axillary or inguinal LUNGS: clear to auscultation and percussion with normal breathing effort HEART: regular rate & rhythm and no murmurs and no lower extremity edema ABDOMEN:abdomen soft, non-tender and normal bowel sounds Musculoskeletal:no cyanosis of digits and no clubbing  NEURO: alert & oriented x 3 with fluent speech, no focal motor/sensory deficits  LABORATORY DATA:  I have reviewed the data as listed    Component Value Date/Time   NA 141 08/25/2018 1038   K 4.1 08/25/2018 1038   CL 106 08/25/2018 1038   CO2 24 08/25/2018 1038   GLUCOSE 86 08/25/2018 1038   BUN 9 08/25/2018 1038   CREATININE 0.70 08/25/2018 1038   CALCIUM 9.2 08/25/2018 1038   PROT 7.1 08/25/2018 1038   ALBUMIN 4.1 08/25/2018 1038   AST 14 (L) 08/25/2018 1038   ALT 16 08/25/2018 1038   ALKPHOS 87 08/25/2018 1038   BILITOT 0.5 08/25/2018 1038   GFRNONAA >60 08/25/2018 1038   GFRAA >60 08/25/2018 1038    No results found for: SPEP, UPEP  Lab Results  Component Value Date   WBC 2.5 (L) 08/25/2018   NEUTROABS 1.3 (L) 08/25/2018   HGB 11.5 (L) 08/25/2018   HCT 35.0 (L) 08/25/2018   MCV 83.3 08/25/2018   PLT 134 (L) 08/25/2018      Chemistry      Component Value Date/Time   NA 141 08/25/2018 1038   K 4.1 08/25/2018 1038   CL 106 08/25/2018 1038   CO2 24 08/25/2018 1038   BUN 9 08/25/2018 1038    CREATININE 0.70 08/25/2018 1038      Component Value Date/Time   CALCIUM 9.2 08/25/2018 1038   ALKPHOS 87 08/25/2018 1038   AST 14 (L) 08/25/2018 1038   ALT 16 08/25/2018 1038   BILITOT 0.5 08/25/2018 1038      All questions were answered. The patient knows to call the clinic with any problems, questions or concerns. No barriers to learning was detected.  I spent 15 minutes counseling the patient face to face. The total time spent in the appointment was 20 minutes and more than 50% was on counseling and review of test results  Heath Lark, MD 08/27/2018 10:48 AM

## 2018-08-30 ENCOUNTER — Encounter: Payer: Self-pay | Admitting: Hematology and Oncology

## 2018-08-30 ENCOUNTER — Ambulatory Visit
Admission: RE | Admit: 2018-08-30 | Discharge: 2018-08-30 | Disposition: A | Discharge status: Home / Self Care | Payer: 59 | Source: Ambulatory Visit | Attending: Radiation Oncology | Admitting: Radiation Oncology

## 2018-08-30 DIAGNOSIS — C52 Malignant neoplasm of vagina: Secondary | ICD-10-CM | POA: Diagnosis not present

## 2018-08-31 ENCOUNTER — Inpatient Hospital Stay: Payer: 59

## 2018-08-31 ENCOUNTER — Ambulatory Visit
Admission: RE | Admit: 2018-08-31 | Discharge: 2018-08-31 | Disposition: A | Discharge status: Home / Self Care | Payer: 59 | Source: Ambulatory Visit | Attending: Radiation Oncology | Admitting: Radiation Oncology

## 2018-08-31 ENCOUNTER — Telehealth: Payer: Self-pay

## 2018-08-31 ENCOUNTER — Telehealth: Payer: Self-pay | Admitting: Oncology

## 2018-08-31 DIAGNOSIS — C52 Malignant neoplasm of vagina: Secondary | ICD-10-CM

## 2018-08-31 LAB — MAGNESIUM: MAGNESIUM: 1.6 mg/dL — AB (ref 1.7–2.4)

## 2018-08-31 LAB — COMPREHENSIVE METABOLIC PANEL
ALT: 17 U/L (ref 0–44)
AST: 13 U/L — ABNORMAL LOW (ref 15–41)
Albumin: 4.2 g/dL (ref 3.5–5.0)
Alkaline Phosphatase: 83 U/L (ref 38–126)
Anion gap: 10 (ref 5–15)
BUN: 11 mg/dL (ref 6–20)
CO2: 25 mmol/L (ref 22–32)
Calcium: 9.3 mg/dL (ref 8.9–10.3)
Chloride: 105 mmol/L (ref 98–111)
Creatinine, Ser: 0.72 mg/dL (ref 0.44–1.00)
GFR calc Af Amer: >60 mL/min (ref >60)
GFR calc non Af Amer: >60 mL/min (ref >60)
GLUCOSE: 90 mg/dL (ref 70–99)
Potassium: 3.9 mmol/L (ref 3.5–5.1)
Sodium: 140 mmol/L (ref 135–145)
Total Bilirubin: 0.4 mg/dL (ref 0.3–1.2)
Total Protein: 7.2 g/dL (ref 6.5–8.1)

## 2018-08-31 LAB — CBC WITH DIFFERENTIAL/PLATELET
Abs Immature Granulocytes: 0 10*3/uL (ref 0.00–0.07)
BASOS ABS: 0 10*3/uL (ref 0.0–0.1)
BASOS PCT: 1 %
Eosinophils Absolute: 0.1 10*3/uL (ref 0.0–0.5)
Eosinophils Relative: 5 %
HCT: 35.2 % — ABNORMAL LOW (ref 36.0–46.0)
Hemoglobin: 11.6 g/dL — ABNORMAL LOW (ref 12.0–15.0)
Immature Granulocytes: 0 %
Lymphocytes Relative: 37 %
Lymphs Abs: 0.7 10*3/uL (ref 0.7–4.0)
MCH: 27.6 pg (ref 26.0–34.0)
MCHC: 33 g/dL (ref 30.0–36.0)
MCV: 83.6 fL (ref 80.0–100.0)
Monocytes Absolute: 0.3 10*3/uL (ref 0.1–1.0)
Monocytes Relative: 16 %
Neutro Abs: 0.8 10*3/uL — ABNORMAL LOW (ref 1.7–7.7)
Neutrophils Relative %: 41 %
PLATELETS: 178 10*3/uL (ref 150–400)
RBC: 4.21 MIL/uL (ref 3.87–5.11)
RDW: 17.9 % — ABNORMAL HIGH (ref 11.5–15.5)
WBC: 2 10*3/uL — ABNORMAL LOW (ref 4.0–10.5)
nRBC: 0 % (ref 0.0–0.2)

## 2018-08-31 MED ORDER — SODIUM CHLORIDE 0.9% FLUSH
10.0000 mL | Freq: Once | INTRAVENOUS | Status: AC
  Administered 2018-08-31: 10 mL
  Filled 2018-08-31: qty 10

## 2018-08-31 MED ORDER — HEPARIN SOD (PORK) LOCK FLUSH 100 UNIT/ML IV SOLN
250.0000 [IU] | Freq: Once | INTRAVENOUS | Status: AC
  Administered 2018-08-31: 250 [IU]
  Filled 2018-08-31: qty 5

## 2018-08-31 NOTE — Telephone Encounter (Signed)
Called and left a message asking her to call the office. 

## 2018-08-31 NOTE — Telephone Encounter (Signed)
Left a message for Bert regarding her [place] and radiation.  Requested a return call.

## 2018-08-31 NOTE — Telephone Encounter (Signed)
-----   Message from Heath Lark, MD sent at 08/31/2018 11:59 AM EST ----- Regarding: labs Hi Hassan Rowan, Let her know her [place] is down Advise neutropenic precaution; should improve by end of next week If she wants to return to work soon, I will check it again next week If not, we can wait till next port flush before repeat labs  Santiago Glad,  Can you ask Dr. Sondra Come to see if he needs to hold radiation?

## 2018-08-31 NOTE — Telephone Encounter (Signed)
Dee called back and was advised of her [place] results and discussed neutropenic precautions.  Also discussed getting lab work on Friday 09/03/18 before her next radiation treatment on Monday per Dr. Sondra Come.  She said she can come in at 9 am. Scheduling message sent.

## 2018-09-01 NOTE — Addendum Note (Signed)
Addended by: Elmo Putt R on: 09/01/2018 08:03 AM   Modules accepted: Orders

## 2018-09-03 ENCOUNTER — Encounter: Payer: Self-pay | Admitting: Hematology and Oncology

## 2018-09-03 ENCOUNTER — Inpatient Hospital Stay: Payer: 59

## 2018-09-03 ENCOUNTER — Telehealth: Payer: Self-pay | Admitting: *Deleted

## 2018-09-03 DIAGNOSIS — C52 Malignant neoplasm of vagina: Secondary | ICD-10-CM

## 2018-09-03 LAB — CBC WITH DIFFERENTIAL (CANCER CENTER ONLY)
Abs Immature Granulocytes: 0.1 10*3/uL — ABNORMAL HIGH (ref 0.00–0.07)
Basophils Absolute: 0 10*3/uL (ref 0.0–0.1)
Basophils Relative: 1 %
Eosinophils Absolute: 0.1 10*3/uL (ref 0.0–0.5)
Eosinophils Relative: 2 %
HCT: 34.5 % — ABNORMAL LOW (ref 36.0–46.0)
HEMOGLOBIN: 11.3 g/dL — AB (ref 12.0–15.0)
Immature Granulocytes: 4 %
Lymphocytes Relative: 30 %
Lymphs Abs: 0.8 10*3/uL (ref 0.7–4.0)
MCH: 27.6 pg (ref 26.0–34.0)
MCHC: 32.8 g/dL (ref 30.0–36.0)
MCV: 84.4 fL (ref 80.0–100.0)
Monocytes Absolute: 0.4 10*3/uL (ref 0.1–1.0)
Monocytes Relative: 15 %
Neutro Abs: 1.2 10*3/uL — ABNORMAL LOW (ref 1.7–7.7)
Neutrophils Relative %: 48 %
Platelet Count: 170 10*3/uL (ref 150–400)
RBC: 4.09 MIL/uL (ref 3.87–5.11)
RDW: 17.9 % — ABNORMAL HIGH (ref 11.5–15.5)
WBC Count: 2.5 10*3/uL — ABNORMAL LOW (ref 4.0–10.5)
nRBC: 0 % (ref 0.0–0.2)

## 2018-09-03 MED ORDER — HEPARIN SOD (PORK) LOCK FLUSH 100 UNIT/ML IV SOLN
500.0000 [IU] | Freq: Once | INTRAVENOUS | Status: AC
  Administered 2018-09-03: 500 [IU]
  Filled 2018-09-03: qty 5

## 2018-09-03 MED ORDER — SODIUM CHLORIDE 0.9% FLUSH
10.0000 mL | Freq: Once | INTRAVENOUS | Status: AC
  Administered 2018-09-03: 10 mL
  Filled 2018-09-03: qty 10

## 2018-09-03 NOTE — Telephone Encounter (Signed)
CALLED PATIENT TO REMIND OF NEW HDR [place] FOR 09-06-18, LVM FOR A RETURN CALL

## 2018-09-06 ENCOUNTER — Telehealth: Payer: Self-pay

## 2018-09-06 ENCOUNTER — Ambulatory Visit
Admission: RE | Admit: 2018-09-06 | Discharge: 2018-09-06 | Disposition: A | Discharge status: Home / Self Care | Payer: 59 | Source: Ambulatory Visit | Attending: Radiation Oncology | Admitting: Radiation Oncology

## 2018-09-06 ENCOUNTER — Other Ambulatory Visit: Payer: Self-pay | Admitting: Hematology and Oncology

## 2018-09-06 ENCOUNTER — Encounter: Payer: Self-pay | Admitting: Radiation Oncology

## 2018-09-06 ENCOUNTER — Encounter: Payer: Self-pay | Admitting: Oncology

## 2018-09-06 ENCOUNTER — Other Ambulatory Visit: Payer: Self-pay

## 2018-09-06 ENCOUNTER — Encounter: Payer: Self-pay | Admitting: Hematology and Oncology

## 2018-09-06 VITALS — BP 119/86 | HR 96 | Temp 98.3°F | Resp 18 | Ht 66.0 in | Wt 150.4 lb

## 2018-09-06 DIAGNOSIS — Z79899 Other long term (current) drug therapy: Secondary | ICD-10-CM | POA: Insufficient documentation

## 2018-09-06 DIAGNOSIS — Z79890 Hormone replacement therapy (postmenopausal): Secondary | ICD-10-CM | POA: Diagnosis not present

## 2018-09-06 DIAGNOSIS — R053 Chronic cough: Secondary | ICD-10-CM

## 2018-09-06 DIAGNOSIS — C52 Malignant neoplasm of vagina: Secondary | ICD-10-CM

## 2018-09-06 DIAGNOSIS — R05 Cough: Secondary | ICD-10-CM

## 2018-09-06 MED ORDER — HYDROCOD POLST-CPM POLST ER 10-8 MG/5ML PO SUER: 5.0000 mL | Freq: Two times a day (BID) | ORAL | 0 refills | Status: DC

## 2018-09-06 NOTE — Progress Notes (Signed)
  Radiation Oncology         [phone]) [phone] ________________________________  Name: Kelly Mcclure MRN: [id number]  Date: 09/06/2018  DOB: 1981/01/29  CC: Brien Few, MD  [name], MD  HDR BRACHYTHERAPY NOTE  DIAGNOSIS: Stage III (T1, N1, cM0) vaginal cancer   Simple treatment device note: Patient had construction of her custom vaginal cylinder. She will be treated with a 2.5 cm diameter segmented cylinder. This conforms to her anatomy without undue discomfort.  Vaginal brachytherapy procedure node: The patient was brought to the [facility]. Identity was confirmed. All relevant records and images related to the planned course of therapy were reviewed. The patient freely provided informed written consent to proceed with treatment after reviewing the details related to the planned course of therapy. The consent form was witnessed and verified by the simulation staff. Then, the patient was set-up in a stable reproducible supine position for radiation therapy. Pelvic exam revealed the vaginal cuff to be intact. The patient's custom vaginal cylinder was placed in the proximal vagina. This was affixed to the CT/MR stabilization plate to prevent slippage. Patient tolerated the placement well.  Verification simulation note:  A fiducial marker was placed within the vaginal cylinder. An AP and lateral film was then obtained through the pelvis area. This documented accurate position of the vaginal cylinder for treatment.  HDR BRACHYTHERAPY TREATMENT  The remote afterloading device was affixed to the vaginal cylinder by catheter. Patient then proceeded to undergo her first high-dose-rate treatment directed at the proximal vagina. The patient was prescribed a dose of 6 gray to be delivered to the mucosal surface. Treatment length was 3.5 cm. Patient was treated with 1 channel using 8 dwell positions. Treatment time was 195.90 seconds. Iridium 192 was the high-dose-rate source for treatment. The patient  tolerated the treatment well. After completion of her therapy, a radiation survey was performed documenting return of the iridium source into the [employer] safe.   PLAN: She will return 2/10 for her second high dose rate treatment. ________________________________  Blair Promise, PhD, MD  This document serves as a record of services personally performed by Gery Pray, MD. It was created on his behalf by Mary-Margaret Loma Messing, a trained medical scribe. The creation of this record is based on the scribe's personal observations and the provider's statements to them. This document has been checked and approved by the attending provider.

## 2018-09-06 NOTE — Progress Notes (Signed)
Pt presents today for new [place] HDR with Dr. Sondra Come. Pt is accompanied by mother. Pt denies c/o pain. Pt has recently been sick with cold-like symptoms such as "nasally symptoms". Pt denies dysuria/hematuria. Pt denies vaginal bleeding/discharge. Pt denies rectal bleeding. Pt reports diarrhea has resolved now that chemotherapy is complete. Pt reports occasional abdominal bloating.   BP 119/86 (BP Location: Left Arm, Patient Position: Sitting)   Pulse 96   Temp 98.3 F (36.8 C) (Oral)   Resp 18   Ht 5\' 6"  (1.676 m)   Wt 150 lb 6 oz (68.2 kg)   LMP 05/17/2018 (Exact Date)   SpO2 93%   BMI 24.27 kg/m   Wt Readings from Last 3 Encounters:  09/06/18 150 lb 6 oz (68.2 kg)  08/26/18 151 lb 9.6 oz (68.8 kg)  08/19/18 151 lb (68.5 kg)   Loma Sousa, RN BSN

## 2018-09-06 NOTE — Telephone Encounter (Signed)
Called regarding mychart message. She is unable to take Magic mouth wash. She had a allergic reaction to it in the past of immediate tongue swelling. She is unable to take benadryl due to her seizure disorder.  She is requesting Tussionex, she has taken it in the past and it helped.

## 2018-09-06 NOTE — Telephone Encounter (Signed)
done

## 2018-09-06 NOTE — Telephone Encounter (Signed)
Called her back and told Rx sent to pharmacy.

## 2018-09-06 NOTE — Progress Notes (Signed)
Radiation Oncology         [phone]) [phone] ________________________________  Name: Kelly Mcclure MRN: [id number]  Date: 09/06/2018  DOB: 08-18-1980  Vaginal Brachytherapy Procedure Note  CC: Brien Few, MD [name], MD    ICD-10-CM   1. Vaginal cancer ([place]) C52     Diagnosis: Stage III (T1, N1, cM0) vaginal cancer  Radiation Treatment Dates: 07/14/18-08/23/18 external beam  Narrative: She returns today for vaginal cylinder fitting.   ALLERGIES: is allergic to sulfa antibiotics; adhesive [tape]; other; relpax [eletriptan hydrobromide]; and ultram [tramadol].  Meds: Current Outpatient Medications  Medication Sig Dispense Refill  . albuterol (PROVENTIL HFA;VENTOLIN HFA) 108 (90 Base) MCG/ACT inhaler Inhale 2 puffs into the lungs every 6 (six) hours as needed for wheezing or shortness of breath. 1 Inhaler 1  . ALPRAZolam (XANAX) 0.5 MG tablet Take 1 tablet (0.5 mg total) by mouth at bedtime as needed for anxiety. 30 tablet 3  . butalbital-acetaminophen-caffeine (FIORICET, ESGIC) 50-325-40 MG tablet Take 1-2 tablets by mouth every 4 (four) hours as needed for migraine.     Marland Kitchen ibuprofen (ADVIL,MOTRIN) 800 MG tablet Take 1 tablet (800 mg total) by mouth every 8 (eight) hours as needed. 30 tablet 0  . LamoTRIgine (LAMICTAL XR) 200 MG TB24 24 hour tablet Take 2 tablets (400 mg total) by mouth 2 (two) times daily. (Patient taking differently: Take 400 mg by mouth 2 (two) times daily. Takes 1000 and 2200) 360 tablet 4  . LamoTRIgine 100 MG TB24 24 hour tablet Take 1 tablet (100 mg total) by mouth at bedtime. 30 tablet 11  . levETIRAcetam (KEPPRA) 750 MG tablet Take 2 tablets (1,500 mg total) by mouth 2 (two) times daily. 360 tablet 4  . lidocaine-prilocaine (EMLA) cream Apply to affected area once 30 g 3  . loratadine (CLARITIN) 10 MG tablet Take 10 mg by mouth every morning.     . montelukast (SINGULAIR) 10 MG tablet TAKE 1 TABLET BY MOUTH EVERYDAY AT BEDTIME 30 tablet 0  . ondansetron  (ZOFRAN) 8 MG tablet Take 1 tablet (8 mg total) by mouth 2 (two) times daily as needed. Start on the third day after chemotherapy. 30 tablet 1  . prochlorperazine (COMPAZINE) 10 MG tablet Take 1 tablet (10 mg total) by mouth every 6 (six) hours as needed (Nausea or vomiting). 30 tablet 1  . SYNTHROID 150 MCG tablet Take 1 tablet (150 mcg total) by mouth daily before breakfast. 90 tablet 3  . chlorpheniramine-HYDROcodone (TUSSIONEX) 10-8 MG/5ML SUER Take 5 mLs by mouth 2 (two) times daily. 140 mL 0  . Multiple Vitamins-Minerals (CENTRUM WOMEN) TABS Take 1 tablet by mouth daily.     No current facility-administered medications for this encounter.     Physical Findings: The patient is in no acute distress. Patient is alert and oriented.  height is 5\' 6"  (1.676 m) and weight is 150 lb 6 oz (68.2 kg). Her oral temperature is 98.3 F (36.8 C). Her blood pressure is 119/86 and her pulse is 96. Her respiration is 18 and oxygen saturation is 93%.   No palpable cervical, supraclavicular or axillary lymphoadenopathy. The heart has a regular rate and rhythm. The lungs are clear to auscultation. Abdomen soft and non-tender.  On pelvic examination the external genitalia were unremarkable. A speculum exam was performed. Vaginal cuff intact, no mucosal lesions. On bimanual exam there were no pelvic masses appreciated.  Lab Findings: Lab Results  Component Value Date   WBC 2.5 (L) 09/03/2018   HGB  11.3 (L) 09/03/2018   HCT 34.5 (L) 09/03/2018   MCV 84.4 09/03/2018   PLT 170 09/03/2018    Radiographic Findings: No results found.  Impression: She will receive 24 Gy in 4 fractions directed to the proximal vaginal vault using Iridium 192 as the high dose rate source.   Patient was fitted for a vaginal cylinder. The patient will be treated with a 2.5 cm diameter cylinder with a treatment length of 3.5 cm. This distended the vaginal vault without undue discomfort. The patient tolerated the procedure  well.  The patient was successfully fitted for a vaginal cylinder. The patient is appropriate to begin vaginal brachytherapy.   Plan: The patient will proceed with CT simulation and vaginal brachytherapy today.    _______________________________   Blair Promise, PhD, MD This document serves as a record of services personally performed by Gery Pray, MD. It was created on his behalf by Mary-Margaret Loma Messing, a trained medical scribe. The creation of this record is based on the scribe's personal observations and the provider's statements to them. This document has been checked and approved by the attending provider.

## 2018-09-06 NOTE — Progress Notes (Signed)
  Radiation Oncology         [phone]) [phone] ________________________________  Name: Kelly Mcclure MRN: [id number]  Date: 09/06/2018  DOB: [birth year]  SIMULATION AND TREATMENT PLANNING NOTE HDR BRACHYTHERAPY  DIAGNOSIS:  Stage III (T1, N1, cM0) vaginal cancer  NARRATIVE:  The patient was brought to the [facility].  Identity was confirmed.  All relevant records and images related to the planned course of therapy were reviewed.  The patient freely provided informed written consent to proceed with treatment after reviewing the details related to the planned course of therapy. The consent form was witnessed and verified by the simulation staff.  Then, the patient was set-up in a stable reproducible  supine position for radiation therapy.  CT images were obtained.  Surface markings were placed.  The CT images were loaded into the planning software.  Then the target and avoidance structures were contoured.  Treatment planning then occurred.  The radiation prescription was entered and confirmed.   I have requested : Brachytherapy Isodose Plan and Dosimetry Calculations to plan the radiation distribution.    PLAN:  The patient will receive 24 Gy in 4 fractions directed to the proximal vagina. The patient will be treated with a 2.5 cm diameter cylinder with a treatment length of 3.5 cm. Iridium 192 will be the high-dose-rate source. ________________________________  Blair Promise, PhD, MD  This document serves as a record of services personally performed by Gery Pray, MD. It was created on his behalf by Mary-Margaret Loma Messing, a trained medical scribe. The creation of this record is based on the scribe's personal observations and the provider's statements to them. This document has been checked and approved by the attending provider.

## 2018-09-09 ENCOUNTER — Ambulatory Visit: Payer: 59 | Admitting: Radiation Oncology

## 2018-09-09 ENCOUNTER — Ambulatory Visit: Payer: Self-pay | Admitting: Radiation Oncology

## 2018-09-10 ENCOUNTER — Ambulatory Visit: Payer: 59 | Admitting: Radiation Oncology

## 2018-09-13 ENCOUNTER — Ambulatory Visit: Payer: 59 | Admitting: Radiation Oncology

## 2018-09-14 ENCOUNTER — Ambulatory Visit: Payer: 59 | Admitting: Radiation Oncology

## 2018-09-14 ENCOUNTER — Telehealth: Payer: Self-pay | Admitting: *Deleted

## 2018-09-14 NOTE — Telephone Encounter (Signed)
Called patient to remind of [place]. for 09-15-18 @ 9 am, lvm for a return call

## 2018-09-15 ENCOUNTER — Encounter: Payer: Self-pay | Admitting: Hematology and Oncology

## 2018-09-15 ENCOUNTER — Ambulatory Visit
Admission: RE | Admit: 2018-09-15 | Discharge: 2018-09-15 | Disposition: A | Discharge status: Home / Self Care | Payer: 59 | Source: Ambulatory Visit | Attending: Radiation Oncology | Admitting: Radiation Oncology

## 2018-09-15 ENCOUNTER — Telehealth: Payer: Self-pay

## 2018-09-15 DIAGNOSIS — C52 Malignant neoplasm of vagina: Secondary | ICD-10-CM | POA: Diagnosis not present

## 2018-09-15 NOTE — Progress Notes (Signed)
  Radiation Oncology         [phone] ________________________________  Name: Kelly Mcclure MRN: [id number]  Date: 09/15/2018  DOB: [birth year]  CC: Brien Few, MD  [name], MD  HDR BRACHYTHERAPY NOTE  DIAGNOSIS: Stage III (T1, N1, cM0) vaginal cancer   Simple treatment device note: Patient had construction of her custom vaginal cylinder. She will be treated with a 2.5 cm diameter segmented cylinder. This conforms to her anatomy without undue discomfort.  Vaginal brachytherapy procedure node: The patient was brought to the [place] [place] suite. Identity was confirmed. All relevant records and images related to the planned course of therapy were reviewed. The patient freely provided informed written consent to proceed with treatment after reviewing the details related to the planned course of therapy. The consent form was witnessed and verified by the simulation staff. Then, the patient was set-up in a stable reproducible supine position for radiation therapy. Pelvic exam revealed the vaginal cuff to be intact. The patient's custom vaginal cylinder was placed in the proximal vagina. This was affixed to the CT/MR stabilization plate to prevent slippage. Patient tolerated the placement well.  Verification simulation note:  A fiducial marker was placed within the vaginal cylinder. An AP and lateral film was then obtained through the pelvis area. This documented accurate position of the vaginal cylinder for treatment.  HDR BRACHYTHERAPY TREATMENT  The remote afterloading device was affixed to the vaginal cylinder by catheter. Patient then proceeded to undergo her second high-dose-rate treatment directed at the proximal vagina. The patient was prescribed a dose of 6 gray to be delivered to the mucosal surface. Treatment length was 3.5 cm. Patient was treated with 1 channel using 8 dwell positions. Treatment time was 213.40 seconds. Iridium 192 was the high-dose-rate source for treatment. The  patient tolerated the treatment well. After completion of her therapy, a radiation survey was performed documenting return of the iridium source into the GammaMed safe.   PLAN: She will return next week for her third high dose rate treatment. ________________________________  Blair Promise, PhD, MD  This document serves as a record of services personally performed by Gery Pray, MD. It was created on his behalf by Mary-Margaret Loma Messing, a trained medical scribe. The creation of this record is based on the scribe's personal observations and the provider's statements to them. This document has been checked and approved by the attending provider.

## 2018-09-15 NOTE — Telephone Encounter (Signed)
Pt calling to report that she had [place] HDR treatment this am and since that time, has a small area near umbilicus that is tender to touch. No N/V. Pt reports abdominal bloating and has recently had diarrhea. Upon interview, pt reports feeling some relief from pain and bloating after having BM. Encouraged pt to take Immodium as directed on box as diarrhea was a common side effect of HDR. Conveyed to pt that if pain and/or diarrhea got significantly worse, to go to ED. Pt verbalized understanding and agreement. Loma Sousa, RN BSN

## 2018-09-17 ENCOUNTER — Encounter: Payer: Self-pay | Admitting: Hematology and Oncology

## 2018-09-21 ENCOUNTER — Telehealth: Payer: Self-pay | Admitting: *Deleted

## 2018-09-21 NOTE — Telephone Encounter (Signed)
CALLED PATIENT TO REMIND OF HDR [place] 09-22-18 @ 10 AM, LVM FOR A RETURN CALL

## 2018-09-22 ENCOUNTER — Ambulatory Visit
Admission: RE | Admit: 2018-09-22 | Discharge: 2018-09-22 | Disposition: A | Discharge status: Home / Self Care | Payer: 59 | Source: Ambulatory Visit | Attending: Radiation Oncology | Admitting: Radiation Oncology

## 2018-09-22 DIAGNOSIS — C52 Malignant neoplasm of vagina: Secondary | ICD-10-CM | POA: Diagnosis not present

## 2018-09-22 NOTE — Progress Notes (Signed)
  Radiation Oncology         [phone] ________________________________  Name: Kelly TOREN MRN: [id number]  Date: 09/22/2018  DOB: [birth year]  CC: Brien Few, MD  [name], MD  HDR BRACHYTHERAPY NOTE  DIAGNOSIS: Stage III (T1, N1, cM0) vaginal cancer   Simple treatment device note: Patient had construction of her custom vaginal cylinder. She will be treated with a 2.5 cm diameter segmented cylinder. This conforms to her anatomy without undue discomfort.  Vaginal brachytherapy procedure node: The patient was brought to the [facility] suite. Identity was confirmed. All relevant records and images related to the planned course of therapy were reviewed. The patient freely provided informed written consent to proceed with treatment after reviewing the details related to the planned course of therapy. The consent form was witnessed and verified by the simulation staff. Then, the patient was set-up in a stable reproducible supine position for radiation therapy. Pelvic exam revealed the vaginal cuff to be intact. The patient's custom vaginal cylinder was placed in the proximal vagina. This was affixed to the CT/MR stabilization plate to prevent slippage. Patient tolerated the placement well.  Verification simulation note:  A fiducial marker was placed within the vaginal cylinder. An AP and lateral film was then obtained through the pelvis area. This documented accurate position of the vaginal cylinder for treatment.  HDR BRACHYTHERAPY TREATMENT  The remote afterloading device was affixed to the vaginal cylinder by catheter. Patient then proceeded to undergo her third high-dose-rate treatment directed at the proximal vagina. The patient was prescribed a dose of 6 gray to be delivered to the mucosal surface. Treatment length was 3.5 cm. Patient was treated with 1 channel using 8 dwell positions. Treatment time was 227.60 seconds. Iridium 192 was the high-dose-rate source for treatment. The patient  tolerated the treatment well. After completion of her therapy, a radiation survey was performed documenting return of the iridium source into the GammaMed safe.   PLAN: She will return next week for her fourth high dose rate treatment. ________________________________  Blair Promise, PhD, MD  This document serves as a record of services personally performed by Gery Pray, MD. It was created on his behalf by Mary-Margaret Loma Messing, a trained medical scribe. The creation of this record is based on the scribe's personal observations and the provider's statements to them. This document has been checked and approved by the attending provider.

## 2018-09-24 ENCOUNTER — Telehealth: Payer: Self-pay | Admitting: *Deleted

## 2018-09-24 NOTE — Telephone Encounter (Signed)
CALLED PATIENT TO REMIND OF HDR [place] 09/27/18 @ 8 AM , SPOKE WITH PATIENT AND SHE IS AWARE OF THIS [place].

## 2018-09-27 ENCOUNTER — Ambulatory Visit
Admission: RE | Admit: 2018-09-27 | Discharge: 2018-09-27 | Disposition: A | Discharge status: Home / Self Care | Payer: 59 | Source: Ambulatory Visit | Attending: Radiation Oncology | Admitting: Radiation Oncology

## 2018-09-27 ENCOUNTER — Encounter: Payer: Self-pay | Admitting: Radiation Oncology

## 2018-09-27 DIAGNOSIS — C52 Malignant neoplasm of vagina: Secondary | ICD-10-CM | POA: Diagnosis not present

## 2018-09-27 NOTE — Progress Notes (Signed)
  Radiation Oncology         [phone] ________________________________  Name: Kelly Mcclure MRN: [id number]  Date: 09/27/2018  DOB: April 13, 1981  CC: Brien Few, MD  [name], MD  HDR BRACHYTHERAPY NOTE  DIAGNOSIS: Stage III (T1, N1, cM0) vaginal cancer   Simple treatment device note: Patient had construction of her custom vaginal cylinder. She will be treated with a 2.5 cm diameter segmented cylinder. This conforms to her anatomy without undue discomfort.  Vaginal brachytherapy procedure node: The patient was brought to the [place] suite. Identity was confirmed. All relevant records and images related to the planned course of therapy were reviewed. The patient freely provided informed written consent to proceed with treatment after reviewing the details related to the planned course of therapy. The consent form was witnessed and verified by the simulation staff. Then, the patient was set-up in a stable reproducible supine position for radiation therapy. Pelvic exam revealed the vaginal cuff to be intact. The patient's custom vaginal cylinder was placed in the proximal vagina. This was affixed to the CT/MR stabilization plate to prevent slippage. Patient tolerated the placement well.  Verification simulation note:  A fiducial marker was placed within the vaginal cylinder. An AP and lateral film was then obtained through the pelvis area. This documented accurate position of the vaginal cylinder for treatment.  HDR BRACHYTHERAPY TREATMENT  The remote afterloading device was affixed to the vaginal cylinder by catheter. Patient then proceeded to undergo her fourth high-dose-rate treatment directed at the proximal vagina. The patient was prescribed a dose of 6 gray to be delivered to the mucosal surface. Treatment length was 3.5 cm. Patient was treated with 1 channel using 8 dwell positions. Treatment time was 238.90 seconds. Iridium 192 was the high-dose-rate source for treatment. The  patient tolerated the treatment well. After completion of her therapy, a radiation survey was performed documenting return of the iridium source into the GammaMed safe.   PLAN: the patient has completed her external beam radiation therapy radiosensitizing chemotherapy and vaginal brachytherapy. Routine follow-up in one month. ________________________________  Blair Promise, PhD, MD  This document serves as a record of services personally performed by Gery Pray, MD. It was created on his behalf by Mary-Margaret Loma Messing, a trained medical scribe. The creation of this record is based on the scribe's personal observations and the provider's statements to them. This document has been checked and approved by the attending provider.

## 2018-09-27 NOTE — Progress Notes (Signed)
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 07/14/18 - 08/19/18      2. 08/20/18-08/31/18      3. 09/06/18, 09/15/18, 09/22/18, 09/27/18  Site/dose: 1. Pelvis; 25 fractions of 1.8 Gy for a total of 45 Gy         2. Pelvis (nodal boost) ; 8 fractions of 1.8 Gy for a total of 14.4 Gy (59.4 Gy cumulative to involved nodes)         3.  Vagina; 6 Gy in 4 fractions for a total dose of 24 Gy  Beams/energy: 1. IMRT Photon; 6X      2. IMRT Photon; 6X      3. HDR Ir-Vaginal, Iridium HDR, patient was treated with a 2.5 cm diameter segmented cylinder, Rx length of 3.5 cm, prescription was 6 gray to the mucosal surface  Narrative: The patient tolerated radiation treatment relatively well.   At the beginning of treatment, pt reported nausea and occasional diarrhea. Pt denied dysuria/hematuria throughout treatments. Towards the end of treatment, pt reported moderate to severe fatigue. Overall the pt was without complaints.   Plan: The patient has completed radiation treatment. The patient will return to radiation oncology clinic for routine followup in one month. I advised them to call or return sooner if they have any questions or concerns related to their recovery or treatment.  -----------------------------------  Blair Promise, PhD, MD  This document serves as a record of services personally performed by Gery Pray, MD. It was created on his behalf by Mary-Margaret Loma Messing, a trained medical scribe. The creation of this record is based on the scribe's personal observations and the provider's statements to them. This document has been  checked and approved by the attending provider.

## 2018-09-28 ENCOUNTER — Telehealth: Payer: Self-pay | Admitting: Oncology

## 2018-09-28 ENCOUNTER — Other Ambulatory Visit: Payer: Self-pay | Admitting: Hematology and Oncology

## 2018-09-28 DIAGNOSIS — C52 Malignant neoplasm of vagina: Secondary | ICD-10-CM

## 2018-09-28 NOTE — Telephone Encounter (Signed)
Called [place] and advised her of appointment with Dr. Denman George on 12/29/18 at 1:15 pm.  She verbalized agreement.

## 2018-10-12 ENCOUNTER — Inpatient Hospital Stay: Payer: 59 | Attending: Gynecologic Oncology

## 2018-10-12 ENCOUNTER — Inpatient Hospital Stay: Payer: 59

## 2018-10-12 DIAGNOSIS — C52 Malignant neoplasm of vagina: Secondary | ICD-10-CM

## 2018-10-12 LAB — CBC WITH DIFFERENTIAL/PLATELET
Abs Immature Granulocytes: 0.02 10*3/uL (ref 0.00–0.07)
Basophils Absolute: 0 10*3/uL (ref 0.0–0.1)
Basophils Relative: 0 %
EOS ABS: 0.3 10*3/uL (ref 0.0–0.5)
EOS PCT: 7 %
HCT: 40.4 % (ref 36.0–46.0)
Hemoglobin: 12.9 g/dL (ref 12.0–15.0)
Immature Granulocytes: 1 %
Lymphocytes Relative: 28 %
Lymphs Abs: 1.1 10*3/uL (ref 0.7–4.0)
MCH: 27.9 pg (ref 26.0–34.0)
MCHC: 31.9 g/dL (ref 30.0–36.0)
MCV: 87.4 fL (ref 80.0–100.0)
Monocytes Absolute: 0.4 10*3/uL (ref 0.1–1.0)
Monocytes Relative: 10 %
Neutro Abs: 2.1 10*3/uL (ref 1.7–7.7)
Neutrophils Relative %: 54 %
PLATELETS: 167 10*3/uL (ref 150–400)
RBC: 4.62 MIL/uL (ref 3.87–5.11)
RDW: 15 % (ref 11.5–15.5)
WBC: 3.8 10*3/uL — ABNORMAL LOW (ref 4.0–10.5)
nRBC: 0 % (ref 0.0–0.2)

## 2018-10-12 LAB — MAGNESIUM: Magnesium: 1.6 mg/dL — ABNORMAL LOW (ref 1.7–2.4)

## 2018-10-12 LAB — CMP (CANCER CENTER ONLY)
ALT: 17 U/L (ref 0–44)
AST: 18 U/L (ref 15–41)
Albumin: 4.2 g/dL (ref 3.5–5.0)
Alkaline Phosphatase: 93 U/L (ref 38–126)
Anion gap: 11 (ref 5–15)
BUN: 13 mg/dL (ref 6–20)
CHLORIDE: 105 mmol/L (ref 98–111)
CO2: 25 mmol/L (ref 22–32)
Calcium: 9.5 mg/dL (ref 8.9–10.3)
Creatinine: 0.75 mg/dL (ref 0.44–1.00)
GFR, Estimated: >60 mL/min (ref >60)
Glucose, Bld: 109 mg/dL — ABNORMAL HIGH (ref 70–99)
Potassium: 3.9 mmol/L (ref 3.5–5.1)
Sodium: 141 mmol/L (ref 135–145)
Total Bilirubin: 0.4 mg/dL (ref 0.3–1.2)
Total Protein: 7.3 g/dL (ref 6.5–8.1)

## 2018-10-12 MED ORDER — SODIUM CHLORIDE 0.9% FLUSH
10.0000 mL | Freq: Once | INTRAVENOUS | Status: AC
  Administered 2018-10-12: 10 mL
  Filled 2018-10-12: qty 10

## 2018-10-12 MED ORDER — HEPARIN SOD (PORK) LOCK FLUSH 100 UNIT/ML IV SOLN
500.0000 [IU] | Freq: Once | INTRAVENOUS | Status: AC
  Administered 2018-10-12: 500 [IU]
  Filled 2018-10-12: qty 5

## 2018-10-13 ENCOUNTER — Encounter: Payer: Self-pay | Admitting: Hematology and Oncology

## 2018-10-17 ENCOUNTER — Encounter: Payer: Self-pay | Admitting: Hematology and Oncology

## 2018-10-21 ENCOUNTER — Other Ambulatory Visit: Payer: Self-pay | Admitting: Neurology

## 2018-10-21 ENCOUNTER — Encounter: Payer: Self-pay | Admitting: Hematology and Oncology

## 2018-10-25 ENCOUNTER — Encounter: Payer: Self-pay | Admitting: Radiation Oncology

## 2018-10-25 ENCOUNTER — Ambulatory Visit
Admission: RE | Admit: 2018-10-25 | Discharge: 2018-10-25 | Disposition: A | Discharge status: Home / Self Care | Payer: 59 | Source: Ambulatory Visit | Attending: Radiation Oncology | Admitting: Radiation Oncology

## 2018-10-25 ENCOUNTER — Other Ambulatory Visit: Payer: Self-pay

## 2018-10-25 VITALS — BP 121/91 | HR 104 | Temp 98.7°F | Resp 18 | Ht 68.0 in | Wt 150.0 lb

## 2018-10-25 DIAGNOSIS — Z79899 Other long term (current) drug therapy: Secondary | ICD-10-CM | POA: Insufficient documentation

## 2018-10-25 DIAGNOSIS — Z923 Personal history of irradiation: Secondary | ICD-10-CM | POA: Diagnosis not present

## 2018-10-25 DIAGNOSIS — C52 Malignant neoplasm of vagina: Secondary | ICD-10-CM | POA: Diagnosis present

## 2018-10-25 DIAGNOSIS — R5383 Other fatigue: Secondary | ICD-10-CM | POA: Diagnosis not present

## 2018-10-25 NOTE — Progress Notes (Signed)
Pt presents today for f/u with Dr. Sondra Come. Pt reports fatigue is improving but persistent. Pt denies c/o pain. Pt denies dysuria/hematuria. Pt reports scant vaginal discharge "a couple weeks ago" but has since resolved. Pt denies rectal bleeding. Pt reports occasional diarrhea. Pt denies abdominal bloating, N/V.   BP (!) 121/91 (BP Location: Left Arm, Patient Position: Sitting)   Pulse (!) 104   Temp 98.7 F (37.1 C) (Oral)   Resp 18   Ht 5\' 8"  (1.727 m)   Wt 150 lb (68 kg)   LMP 05/17/2018 (Exact Date)   SpO2 98%   BMI 22.81 kg/m   Wt Readings from Last 3 Encounters:  10/25/18 150 lb (68 kg)  09/06/18 150 lb 6 oz (68.2 kg)  08/26/18 151 lb 9.6 oz (68.8 kg)   Loma Sousa, RN BSN     Home Care Instructions for the Insertion and Care of Your Vaginal Dilator  Why Do I Need a Vaginal Dilator?  Internal radiation therapy may cause scar tissue to form at the top of your vagina (vaginal cuff).  This may make vaginal examinations difficult in the future. You can prevent scar tissue from forming by using a vaginal dilator (a smooth plastic rod), and/or by having regular sexual intercourse.  If not using the dilator you should be having intercourse two or three times a week.  If you are unable to have intercourse, you should use your vaginal dilator.  You may have some spotting or bleeding from your dilator or intercourse the first few times. You may also have some discomfort. If discomfort occurs with intercourse, you and your partner may need to stop for a while and try again later.  How to Use Your Vaginal Dilator  - Wash the dilator with soap and water before and after each use. - Check the dilator to be sure it is smooth. Do not use the dilator if you find any roughspots. - Coat the dilator with K-Y Jelly, Astroglide, or Replens. Do not use Vaseline, baby oil, or other oil based lubricants. They are not water-soluble and can be irritating to the tissues in the  vagina. - Lie on your back with your knees bent and legs apart. - Insert the rounded end of the dilator into your vagina as far as it will go without causing pain or discomfort. - Close your knees and slowly straighten your legs. - Keep the dilator in your vagina for about 10 to 15 minutes.  Please use 3 times a week, for example: Monday, Wednesday and Friday evenings. [facility] your knees, open your legs, and gently remove the dilator. - Gently cleanse the skin around the vaginal opening. - Wash the dilator after each use. -  It is important that you use the dilator routinely until instructed otherwise by your doctor.   Loma Sousa, RN BSN

## 2018-10-25 NOTE — Patient Instructions (Signed)
Coronavirus (COVID-19) Are you at risk?  Are you at risk for the Coronavirus (COVID-19)?  To be considered HIGH RISK for Coronavirus (COVID-19), you have to meet the following criteria:  . Traveled to [place], [place], [place], [place] or [place]; or in the [place] to [place], [place], [place], or [place]; and have fever, cough, and shortness of breath within the last 2 weeks of travel OR . Been in close contact with a person diagnosed with COVID-19 within the last 2 weeks and have fever, cough, and shortness of breath . IF YOU DO NOT MEET THESE CRITERIA, YOU ARE CONSIDERED LOW RISK FOR COVID-19.  What to do if you are HIGH RISK for COVID-19?  . If you are having a medical emergency, call 911. . Seek medical care right away. Before you go to a doctor's office, urgent care or emergency department, call ahead and tell them about your recent travel, contact with someone diagnosed with COVID-19, and your symptoms. You should receive instructions from your physician's office regarding next steps of care.  . When you arrive at healthcare provider, tell the healthcare staff immediately you have returned from visiting [place], [place], [place], [place] or [place]; or traveled in the [place] to [place], [place], [place], or [place]; in the last two weeks or you have been in close contact with a person diagnosed with COVID-19 in the last 2 weeks.   . Tell the health care staff about your symptoms: fever, cough and shortness of breath. . After you have been seen by a medical provider, you will be either: o Tested for (COVID-19) and discharged home on quarantine except to seek medical care if symptoms worsen, and asked to  - Stay home and avoid contact with others until you get your results (4-5 days)  - Avoid travel on public transportation if possible (such as bus, train, or airplane) or o Sent to the Emergency Department by EMS for evaluation, COVID-19 testing, and possible  admission depending on your condition and test results.  What to do if you are LOW RISK for COVID-19?  Reduce your risk of any infection by using the same precautions used for avoiding the common cold or flu:  . Wash your hands often with soap and warm water for at least 20 seconds.  If soap and water are not readily available, use an alcohol-based hand sanitizer with at least 60% alcohol.  . If coughing or sneezing, cover your mouth and nose by coughing or sneezing into the elbow areas of your shirt or coat, into a tissue or into your sleeve (not your hands). . Avoid shaking hands with others and consider head nods or verbal greetings only. . Avoid touching your eyes, nose, or mouth with unwashed hands.  . Avoid close contact with people who are sick. . Avoid places or events with large numbers of people in one location, like concerts or sporting events. . Carefully consider travel plans you have or are making. . If you are planning any travel outside or inside the [place], visit the CDC's Travelers' Health webpage for the latest health notices. . If you have some symptoms but not all symptoms, continue to monitor at home and seek medical attention if your symptoms worsen. . If you are having a medical emergency, call 911.   ADDITIONAL HEALTHCARE OPTIONS FOR PATIENTS  [facility] / e-Visit: [email or web address]         [facility]: [phone]  [facility]: [phone]                   [facility]: [phone]   

## 2018-10-25 NOTE — Progress Notes (Signed)
Radiation Oncology         [phone] ________________________________  Name: Kelly Mcclure MRN: [id number]  Date: 10/25/2018  DOB: February 24, 1981  Follow-Up Visit Note  CC: Brien Few, MD  Brien Few, MD    ICD-10-CM   1. Vaginal cancer ([place]) C52     Diagnosis:   Stage III vaginal cancer(pT1b,cN1,M0invasive moderately differentiated squamous cell carcinoma)  Interval Since Last Radiation:  1 months  Radiation treatment dates:   1. 07/14/18 - 08/19/18                                                  2. 08/20/18-08/31/18                                                  3. 09/06/18, 09/15/18, 09/22/18, 09/27/18  Site/dose: 1. Pelvis; 25 fractions of 1.8 Gy for a total of 45 Gy                    2. Pelvis (nodal boost) ; 8 fractions of 1.8 Gy for a total of 14.4 Gy (59.4 Gy cumulative to involved nodes)                    3.  Vagina; 6 Gy in 4 fractions for a total dose of 24 Gy (brachytherapy treatment)  Narrative:  The patient returns today for routine follow-up.  she is doing well overall.   On review of systems, she reports lingering fatigue following her treatment completion. she denies any other symptoms.                  ALLERGIES:  is allergic to sulfa antibiotics; adhesive [tape]; other; relpax [eletriptan hydrobromide]; and ultram [tramadol].  Meds: Current Outpatient Medications  Medication Sig Dispense Refill  . albuterol (PROVENTIL HFA;VENTOLIN HFA) 108 (90 Base) MCG/ACT inhaler Inhale 2 puffs into the lungs every 6 (six) hours as needed for wheezing or shortness of breath. 1 Inhaler 1  . ALPRAZolam (XANAX) 0.5 MG tablet Take 1 tablet (0.5 mg total) by mouth at bedtime as needed for anxiety. 30 tablet 3  . butalbital-acetaminophen-caffeine (FIORICET, ESGIC) 50-325-40 MG tablet Take 1-2 tablets by mouth every 4 (four) hours as needed for migraine.     . chlorpheniramine-HYDROcodone (TUSSIONEX) 10-8 MG/5ML SUER Take 5 mLs by mouth 2 (two) times daily. 140 mL 0   . ibuprofen (ADVIL,MOTRIN) 800 MG tablet Take 1 tablet (800 mg total) by mouth every 8 (eight) hours as needed. 30 tablet 0  . LamoTRIgine (LAMICTAL XR) 200 MG TB24 24 hour tablet Take 2 tablets (400 mg total) by mouth 2 (two) times daily. (Patient taking differently: Take 400 mg by mouth 2 (two) times daily. Takes 1000 and 2200) 360 tablet 4  . LamoTRIgine 100 MG TB24 24 hour tablet Take 1 tablet (100 mg total) by mouth at bedtime. 30 tablet 11  . levETIRAcetam (KEPPRA) 750 MG tablet Take 2 tablets (1,500 mg total) by mouth 2 (two) times daily. 360 tablet 4  . lidocaine-prilocaine (EMLA) cream Apply to affected area once 30 g 3  . loratadine (CLARITIN) 10 MG tablet Take 10 mg by mouth every morning.     Marland Kitchen  montelukast (SINGULAIR) 10 MG tablet TAKE 1 TABLET BY MOUTH EVERYDAY AT BEDTIME 30 tablet 0  . ondansetron (ZOFRAN) 8 MG tablet Take 1 tablet (8 mg total) by mouth 2 (two) times daily as needed. Start on the third day after chemotherapy. 30 tablet 1  . prochlorperazine (COMPAZINE) 10 MG tablet Take 1 tablet (10 mg total) by mouth every 6 (six) hours as needed (Nausea or vomiting). 30 tablet 1  . SYNTHROID 150 MCG tablet Take 1 tablet (150 mcg total) by mouth daily before breakfast. 90 tablet 3  . Multiple Vitamins-Minerals (CENTRUM WOMEN) TABS Take 1 tablet by mouth daily.     No current facility-administered medications for this encounter.     Physical Findings: The patient is in no acute distress. Patient is alert and oriented.  height is 5\' 8"  (1.727 m) and weight is 150 lb (68 kg). Her oral temperature is 98.7 F (37.1 C). Her blood pressure is 121/91 (abnormal) and her pulse is 104 (abnormal). Her respiration is 18 and oxygen saturation is 98%. .  No significant changes. Lungs are clear to auscultation bilaterally. Heart has regular rate and rhythm. No palpable cervical, supraclavicular, or axillary adenopathy. Abdomen soft, non-tender, normal bowel sounds. Pelvic exam deferred in light  of recent treatment completion.  Lab Findings: Lab Results  Component Value Date   WBC 3.8 (L) 10/12/2018   HGB 12.9 10/12/2018   HCT 40.4 10/12/2018   MCV 87.4 10/12/2018   PLT 167 10/12/2018    Radiographic Findings: No results found.  Impression:  The patient is recovering from the effects of radiation.  Clinically stable. She still has persistent fatigue, but no other symptoms at this time. She reports her energy level is 85% of normal.   Plan:  She will follow-up with Dr. Denman George May 27th, and follow-up in radiation oncology in late August. Pt was given a vaginal dilator and instructions on its use. She will start back work next week, at half-time schedule for two weeks, and see how she tolerates this.   ____________________________________   Blair Promise, PhD, MD    This document serves as a record of services personally performed by Gery Pray, MD. It was created on his behalf by Mary-Margaret Loma Messing, a trained medical scribe. The creation of this record is based on the scribe's personal observations and the provider's statements to them. This document has been checked and approved by the attending provider.

## 2018-11-04 ENCOUNTER — Telehealth: Payer: Self-pay | Admitting: *Deleted

## 2018-11-04 NOTE — Telephone Encounter (Signed)
CALLED PATIENT TO INFORM OF FU APPT. WITH DR. KINARD ON 03-31-19 @ 10 AM, PATIENT AGREED TO DATE AND TIME

## 2018-11-23 ENCOUNTER — Inpatient Hospital Stay: Payer: 59 | Attending: Gynecologic Oncology

## 2018-11-23 ENCOUNTER — Encounter: Payer: Self-pay | Admitting: Hematology and Oncology

## 2018-11-23 ENCOUNTER — Inpatient Hospital Stay: Payer: 59

## 2018-11-23 ENCOUNTER — Telehealth: Payer: Self-pay | Admitting: Oncology

## 2018-11-23 ENCOUNTER — Other Ambulatory Visit: Payer: Self-pay

## 2018-11-23 DIAGNOSIS — C52 Malignant neoplasm of vagina: Secondary | ICD-10-CM

## 2018-11-23 LAB — CBC WITH DIFFERENTIAL/PLATELET
Abs Immature Granulocytes: 0.02 10*3/uL (ref 0.00–0.07)
Basophils Absolute: 0 10*3/uL (ref 0.0–0.1)
Basophils Relative: 1 %
Eosinophils Absolute: 0.5 10*3/uL (ref 0.0–0.5)
Eosinophils Relative: 12 %
HCT: 42.4 % (ref 36.0–46.0)
Hemoglobin: 13.6 g/dL (ref 12.0–15.0)
Immature Granulocytes: 1 %
Lymphocytes Relative: 33 %
Lymphs Abs: 1.4 10*3/uL (ref 0.7–4.0)
MCH: 27.8 pg (ref 26.0–34.0)
MCHC: 32.1 g/dL (ref 30.0–36.0)
MCV: 86.5 fL (ref 80.0–100.0)
Monocytes Absolute: 0.4 10*3/uL (ref 0.1–1.0)
Monocytes Relative: 9 %
Neutro Abs: 1.9 10*3/uL (ref 1.7–7.7)
Neutrophils Relative %: 44 %
Platelets: 203 10*3/uL (ref 150–400)
RBC: 4.9 MIL/uL (ref 3.87–5.11)
RDW: 12.1 % (ref 11.5–15.5)
WBC: 4.2 10*3/uL (ref 4.0–10.5)
nRBC: 0 % (ref 0.0–0.2)

## 2018-11-23 LAB — COMPREHENSIVE METABOLIC PANEL
ALT: 20 U/L (ref 0–44)
AST: 18 U/L (ref 15–41)
Albumin: 4.3 g/dL (ref 3.5–5.0)
Alkaline Phosphatase: 100 U/L (ref 38–126)
Anion gap: 10 (ref 5–15)
BUN: 10 mg/dL (ref 6–20)
CO2: 25 mmol/L (ref 22–32)
Calcium: 9.3 mg/dL (ref 8.9–10.3)
Chloride: 106 mmol/L (ref 98–111)
Creatinine, Ser: 0.8 mg/dL (ref 0.44–1.00)
GFR calc Af Amer: >60 mL/min (ref >60)
GFR calc non Af Amer: >60 mL/min (ref >60)
Glucose, Bld: 87 mg/dL (ref 70–99)
Potassium: 4.1 mmol/L (ref 3.5–5.1)
Sodium: 141 mmol/L (ref 135–145)
Total Bilirubin: 0.4 mg/dL (ref 0.3–1.2)
Total Protein: 7.4 g/dL (ref 6.5–8.1)

## 2018-11-23 LAB — MAGNESIUM: Magnesium: 1.7 mg/dL (ref 1.7–2.4)

## 2018-11-23 NOTE — Telephone Encounter (Signed)
Left a message for Kelly Mcclure that her lab work from today was all normal per Dr. Alvy Bimler.

## 2018-11-25 ENCOUNTER — Telehealth: Payer: Self-pay | Admitting: Oncology

## 2018-11-25 NOTE — Telephone Encounter (Signed)
Left a message for Kelly Mcclure with appointment and instructions for a PET scan on 12/28/18 at 10 am (arrival 9:30 am, NPO after midnight).

## 2018-12-07 ENCOUNTER — Inpatient Hospital Stay: Payer: 59 | Attending: Gynecologic Oncology

## 2018-12-07 ENCOUNTER — Other Ambulatory Visit: Payer: Self-pay

## 2018-12-07 DIAGNOSIS — C775 Secondary and unspecified malignant neoplasm of intrapelvic lymph nodes: Secondary | ICD-10-CM | POA: Insufficient documentation

## 2018-12-07 DIAGNOSIS — C52 Malignant neoplasm of vagina: Secondary | ICD-10-CM | POA: Diagnosis present

## 2018-12-07 DIAGNOSIS — Z923 Personal history of irradiation: Secondary | ICD-10-CM | POA: Insufficient documentation

## 2018-12-07 DIAGNOSIS — Z90710 Acquired absence of both cervix and uterus: Secondary | ICD-10-CM | POA: Insufficient documentation

## 2018-12-07 DIAGNOSIS — Z452 Encounter for adjustment and management of vascular access device: Secondary | ICD-10-CM | POA: Diagnosis not present

## 2018-12-07 DIAGNOSIS — Z9079 Acquired absence of other genital organ(s): Secondary | ICD-10-CM | POA: Diagnosis not present

## 2018-12-07 MED ORDER — HEPARIN SOD (PORK) LOCK FLUSH 100 UNIT/ML IV SOLN
250.0000 [IU] | Freq: Once | INTRAVENOUS | Status: AC
  Administered 2018-12-07: 250 [IU]
  Filled 2018-12-07: qty 5

## 2018-12-07 MED ORDER — SODIUM CHLORIDE 0.9% FLUSH
10.0000 mL | Freq: Once | INTRAVENOUS | Status: AC
  Administered 2018-12-07: 10 mL
  Filled 2018-12-07: qty 10

## 2018-12-08 ENCOUNTER — Telehealth: Payer: Self-pay | Admitting: Neurology

## 2018-12-08 NOTE — Telephone Encounter (Signed)
Letter to prove her medical condition, epilepsy and depression

## 2018-12-16 ENCOUNTER — Telehealth: Payer: Self-pay | Admitting: Hematology and Oncology

## 2018-12-16 NOTE — Telephone Encounter (Signed)
Scheduled lab and port flush for PET scan per sch msg. Called patient,, no answer, left msg.

## 2018-12-17 ENCOUNTER — Other Ambulatory Visit: Payer: Self-pay | Admitting: Neurology

## 2018-12-17 DIAGNOSIS — G40909 Epilepsy, unspecified, not intractable, without status epilepticus: Secondary | ICD-10-CM

## 2018-12-23 ENCOUNTER — Other Ambulatory Visit: Payer: Self-pay

## 2018-12-23 ENCOUNTER — Encounter: Payer: Self-pay | Admitting: Neurology

## 2018-12-23 ENCOUNTER — Ambulatory Visit (INDEPENDENT_AMBULATORY_CARE_PROVIDER_SITE_OTHER): Payer: 59 | Admitting: Neurology

## 2018-12-23 DIAGNOSIS — F329 Major depressive disorder, single episode, unspecified: Secondary | ICD-10-CM

## 2018-12-23 DIAGNOSIS — F32A Depression, unspecified: Secondary | ICD-10-CM

## 2018-12-23 DIAGNOSIS — G40909 Epilepsy, unspecified, not intractable, without status epilepticus: Secondary | ICD-10-CM

## 2018-12-23 MED ORDER — LAMOTRIGINE ER 100 MG PO TB24: 100.0000 mg | ORAL_TABLET | Freq: Every day | ORAL | 4 refills | Status: DC

## 2018-12-23 MED ORDER — LAMOTRIGINE ER 200 MG PO TB24: 400.0000 mg | ORAL_TABLET | Freq: Two times a day (BID) | ORAL | 4 refills | Status: AC

## 2018-12-23 MED ORDER — LEVETIRACETAM 750 MG PO TABS: 1500.0000 mg | ORAL_TABLET | Freq: Two times a day (BID) | ORAL | 4 refills | Status: AC

## 2018-12-23 NOTE — Progress Notes (Signed)
PATIENT: Kelly Mcclure DOB: [birth year]  No chief complaint on file.    HISTORICAL  CARNELL CASAMENTO is a 38 years old right-handed female, accompanied by her husband Legrand Como, seen in refer by her obstetrician Dr.Taavon, Richard,for evaluation of seizure, initial evaluation was on March 19 2017  She had past medical history of cervical cancer, GERD, radioactive iodine thyroid ablation, hypothyroidism, chronic migraine, history of kidney stone, epilepsy, she is currently [redacted] weeks pregnant  She was diagnosed with epilepsy since 2002, has been under the care of [facility] neurologist Dr. Harrie Foreman.  I reviewed the most recent evaluation by Dr. Harrie Foreman on January 23 2017,   In June of 2002, she presented her first generalized tonic-clonic seizure while having the vacation at the beach, this happened in the setting of sleep deprivation, mild alcohol use  MRI of the brain, MRA of the brain showed no significant abnormality.  EEG at that time demonstrate generalized epileptiform discharge per description. She was started on Depakote 250 mg 3 times a day, she reported significant weight gain within short period of time, then she was switched to Topamax for extended period of time untill 2011, she was diagnosed with kidney stone.  She was switched to lamotrigine since 2011, at later multiple follow-up visits, there was description of spacing out spells, no myoclonic jerking, Keppra was added on later, which has helped her spells.but she still has occasionally recurrent spells  She  has been doing well taking Keppra 500 mg 3 tablets twice a day, lamotrigine 150 mg 3 tablets twice a day,she is currently [redacted] weeks pregnant, but still has occasionally space out spells, eyes rolled back, transient lapse of time, lasting few seconds.  On March 14 2017, when she was 6 months pregnant, her husband heard a loud screaming noise, saw patient body tense up, foaming coming out of her mouth,lasting for  few minutes, was taken to the emergency room by ambulance  Laboratory evaluations UA showed large amount of leukocytes, protein was elevated 100,UDS was negative, CBC was within normal limits, hemoglobin was 12.3,CMP showed low potassium 3.0,creatinine 0.56  Lamictal level was 2.3, 24 hours urine protein was less than 6, within normal limit  I personally reviewed MRI of the brain without contrast on March 16 2017,that was normal  She was given IV magnesium infusion, now she is on higher dose of lamotrigine XR 400 plus 50 mg twice a day, Keppra 1000 plus 750 mg twice a day, she tolerated the medication well,  UPDATE Jun 23 2017: Patient had elective C-section on May 27 2017, with healthy baby girl  Before delivery, May 25 2017, lamotrigine level 5.9, Keppra 14,  Postpartum November 1, lamotrigine 10.4, Keppra level 19.2,  On Jun 08 2017 She decreased her lamotrigine xr 200 mg to 2 tablets twice a day, stop 50 mg tablets  Decrease Keppra to 750 mg tablet 2 tablets twice a day  She is doing well there was no recurrent seizure she is not breast-feeding  UPDATE Dec 21 2017: She was doing really well until recently, her best friend passed away, she has worsening depression anxiety, has not been resting well, in the past few days, had recurrent spells of spacing out, her eyes rolled back, last less than 1 minute, no generalized seizure.  She is taking lamotrigine ER 200 mg 2 tablets twice a day, and Keppra 750 mg 2 tablets twice a day  UPDATE Jul 09 2018: She had no recurrent seizure, tolerating current  antiepileptic medications, Lamotrigine xr 516m daily, keppra 7566m2 tabs twice a day,  Migraine is under good control. She was recently diagnosed and treated for vaginal cancer.  Virtual Visit via Video  I connected with ShJERSIE BEELn 12/23/18 at  by Video and verified that I am speaking with the correct person using two identifiers.   I discussed the limitations, risks,  security and privacy concerns of performing an evaluation and management service by video and the availability of in person appointments. I also discussed with the patient that there may be a patient responsible charge related to this service. The patient expressed understanding and agreed to proceed.  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  She is awake alert oriented to history taking care of conversation.  Assessment and Plan: Epilepsy  Keep current dose of lamotrigine ER 200 mg 2 tablets twice a day +100 mg every night, Keppra 750 mg 2 tablets twice a day  Check level    Follow Up Instructions:   6 months with nurse practitioner SarJudson RochI discussed the assessment and treatment plan with the patient. The patient was provided an opportunity to ask questions and all were answered. The patient agreed with the plan and demonstrated an understanding of the instructions.   The patient was advised to call back or seek an in-person evaluation if the symptoms worsen or if the condition fails to improve as anticipated.  I provided 30  minutes of non-face-to-face time during this encounter.   YijMarcial PacasD   REVIEW OF SYSTEMS: Full 14 system review of systems performed and notable only for as above  ALLERGIES: Allergies  Allergen Reactions  . Sulfa Antibiotics Swelling    Tongue swelling, throat irritated  . Adhesive [Tape] Other (See Comments)    "skin burn"  . Other Swelling and Other (See Comments)    Magic mouth  wash/ causes swelling of the tongue  . Relpax [Eletriptan Hydrobromide] Other (See Comments)    Stroke-like symptoms  . Ultram [Tramadol] Other (See Comments)    2004--  Per pt had seizure due to interaction with other medication she was taking at the time    HOME MEDICATIONS: Current Outpatient Medications  Medication Sig Dispense Refill  . albuterol (PROVENTIL HFA;VENTOLIN HFA) 108 (90 Base) MCG/ACT inhaler Inhale 2 puffs into the lungs every 6 (six) hours as needed for wheezing or shortness of breath. 1 Inhaler 1  . ALPRAZolam (XANAX) 0.5 MG tablet Take 1 tablet (0.5 mg total) by mouth at bedtime as needed for anxiety. 30 tablet 3  . butalbital-acetaminophen-caffeine (FIORICET, ESGIC) 50-325-40 MG tablet Take 1-2 tablets by mouth every 4 (four) hours as needed for migraine.     . chlorpheniramine-HYDROcodone (TUSSIONEX) 10-8 MG/5ML SUER Take 5 mLs by mouth 2 (two) times daily. 140 mL 0  . ibuprofen (ADVIL,MOTRIN) 800 MG tablet Take 1 tablet (800 mg total) by mouth every 8 (eight) hours as needed. 30 tablet 0  . LamoTRIgine 100 MG TB24 24 hour tablet Take 1 tablet (100 mg  total) by mouth at bedtime. 30 tablet 11  . LamoTRIgine 200 MG TB24 24 hour tablet TAKE 2 TABLETS (400 MG TOTAL) BY MOUTH 2 (TWO) TIMES DAILY. 360 tablet 0  . levETIRAcetam (KEPPRA) 750 MG tablet Take 2 tablets (1,500 mg total) by mouth 2 (two) times daily. 360 tablet 4  . lidocaine-prilocaine (EMLA) cream Apply to affected area once 30 g 3  . loratadine (CLARITIN) 10 MG tablet Take 10 mg by mouth every morning.     . montelukast (SINGULAIR) 10 MG tablet TAKE 1 TABLET BY MOUTH EVERYDAY AT BEDTIME 30 tablet 0  . Multiple Vitamins-Minerals (CENTRUM WOMEN) TABS Take 1 tablet by mouth daily.    . ondansetron (ZOFRAN) 8 MG tablet Take 1 tablet (8 mg total) by mouth 2 (two) times daily as needed. Start on the third day after chemotherapy. 30 tablet 1  . prochlorperazine (COMPAZINE) 10 MG tablet Take 1 tablet (10 mg total) by  mouth every 6 (six) hours as needed (Nausea or vomiting). 30 tablet 1  . SYNTHROID 150 MCG tablet Take 1 tablet (150 mcg total) by mouth daily before breakfast. 90 tablet 3   No current facility-administered medications for this visit.     PAST MEDICAL HISTORY: Past Medical History:  Diagnosis Date  . Cancer [facility]) DX 2011   CERVICAL   . Cough    WITH SEASONAL ALLEGIES, NONPRODUCTIVE LAST 2 DAYS  . H/O radioactive iodine thyroid ablation 12/2009  . 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 History of cervical dysplasia   . History of hyperthyroidism    per pt dx 2000 -- s/p RAI  2011  . History of kidney stones 2011  . Hx of varicella   . Hypothyroidism, postradioiodine therapy    endocrinology-  dr Cruzita Lederer  . Migraines   . Mild persistent asthma 01/19/2018   followed by dr Verlin Fester ([facility]) MILD (SUBCLINICAL PER PT)  . Seizure disorder [facility]) neurologist-  dr Krista Blue   Dx age 53-- per pt subclinical epilepsy-- controlled w/ meds,  last seizure 08/ 2018  . 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 Vaginal Pap smear, abnormal     PAST SURGICAL HISTORY: Past Surgical History:  Procedure Laterality Date  . ABDOMINAL CERCLAGE N/A 11/19/2016   Procedure: LAPAROSCOPIC TRANSABDOMINAL CERVICAL-ISTHMUSx2;  Surgeon: Governor Specking, MD;  Location: [facility];  Service: Gynecology;  Laterality: N/A;  with ultrasound guidance. Start laparoscopically HOLD OPEN ABDOMINAL ITEMS   . CERCLAGE LAPAROSCOPIC ABDOMINAL N/A 11/19/2016   Procedure: CERCLAGE LAPAROSCOPIC ABDOMINAL;  Surgeon: Governor Specking, MD;  Location: [facility];  Service: Gynecology;  Laterality: N/A;  . CERVICAL CONIZATION W/BX N/A 02/18/2018   Procedure: COLD KNIFE CONIZATION OF CERVIX WITH POSSIBLE BIOPSY;  Surgeon: [name], MD;  Location: [place];  Service: Gynecology;  Laterality: N/A;  . CESAREAN SECTION N/A 05/27/2017   Procedure: Primary CESAREAN SECTION;   Surgeon: Brien Few, MD;  Location: [place];  Service: Obstetrics;  Laterality: N/A;  EDD: 11/4/18Allergy: Adhesive, Relpax, Sulfa, Ultram  . COLD KNIFE CONIZATION  03-29-2010   dr Ronita Hipps  [facility]  . DILATION AND CURETTAGE OF UTERUS  2009  . DILATION AND CURETTAGE OF UTERUS N/A 03/01/2013   Procedure: CERVICAL DILATATION AND ENDOCERVICAL CURRETAGE AND VAAGINAL BIOPIES;  Surgeon: Alvino Chapel, MD;  Location: [facility];  Service: Gynecology;  Laterality: N/A;  . IR IMAGING GUIDED PORT INSERTION  07/12/2018  . LEEP  02/25/2010;  12/ 2012  dr Ronita Hipps office  . NASAL SEPTUM SURGERY  2006   Repair of deviated septum  . SIGMOIDOSCOPY  2009  . TONSILLECTOMY AND ADENOIDECTOMY  1995    FAMILY HISTORY: Family History  Problem Relation Age of Onset  . Leukemia Other   . Diabetes Other   . Diabetes Mother   . Skin cancer Mother   . Hyperlipidemia Mother   . Hypertension Father   . Asthma Father   . Skin cancer Maternal Aunt   . Multiple myeloma Maternal Aunt   . Testicular cancer Maternal Uncle   . Hyperlipidemia Maternal Grandmother   . AAA (abdominal aortic aneurysm) Maternal Grandmother   . COPD Maternal Grandmother   . Prostate cancer Maternal Grandfather   . AAA (abdominal aortic aneurysm) Maternal Grandfather     SOCIAL HISTORY:  Social History   Social History  . Marital status: Single    Spouse name: N/A  . Number of children: N/A  . Years of education: N/A   Occupational History  . 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   Social History Main Topics  . Smoking status: Never Smoker  . Smokeless tobacco: Never Used  . Alcohol use No     Comment: Rare  . Drug use: No  . Sexual activity: Yes   Other Topics Concern  . Not on file   Social History Narrative  . No narrative on file     PHYSICAL EXAM   There were no vitals filed for this visit.  Not recorded      There is no height or weight on file to calculate BMI.  PHYSICAL EXAMNIATION:  Gen: NAD,  conversant, well nourised, obese, well groomed                     Cardiovascular: Regular rate rhythm, no peripheral edema, warm, nontender. Eyes: Conjunctivae clear without exudates or hemorrhage Neck: Supple, no carotid bruits. Pulmonary: Clear to auscultation bilaterally   NEUROLOGICAL EXAM:  MENTAL STATUS: Speech:    Speech is normal; fluent and spontaneous with normal comprehension.  Cognition:     Orientation to time, place and person     Normal recent and remote memory     Normal Attention span and concentration     Normal Language, naming, repeating,spontaneous speech     Fund of knowledge   CRANIAL NERVES: CN II: Visual fields are full to confrontation. Pupils are round equal and briskly reactive to light. CN III, IV, VI: extraocular movement are normal. No ptosis. CN V: Facial sensation is intact to pinprick in all 3 divisions bilaterally. Corneal responses are intact.  CN VII: Face is symmetric with normal eye closure and smile. CN VIII: Hearing is normal to rubbing fingers CN IX, X: Palate elevates symmetrically. Phonation is normal. CN XI: Head turning and shoulder shrug are intact CN XII: Tongue is midline with normal movements and no atrophy.  MOTOR: There is no pronator drift of out-stretched arms. Muscle bulk and tone are normal. Muscle strength is normal.  REFLEXES: Reflexes are 2+ and symmetric at the biceps, triceps, knees, and ankles. Plantar responses are flexor.  SENSORY: Intact to light touch, pinprick, positional sensation and vibratory sensation are intact in fingers and toes.  COORDINATION: Rapid alternating movements and fine finger movements are intact. There is no dysmetria on finger-to-nose and heel-knee-shin.    GAIT/STANCE: Posture is normal. Gait is steady with normal steps, base, arm swing, and turning. Heel and toe walking are normal. Tandem  gait is normal.  Romberg is absent.   DIAGNOSTIC DATA (LABS, IMAGING, TESTING) - I reviewed  patient records, labs, notes, testing and imaging myself where available.   ASSESSMENT AND PLAN  RIKO LUMSDEN is a 38 y.o. female    Idiopathic generalized epilepsy   Recurrent generalized seizure on March 14 2017 while she was 6 months pregnant  EEG showed frequent generalized epileptiform discharge,  Doing well taking current lamotrigine XR 200 mg 2 tablets twice a day, Keppra 750 mg 2 tablets twice a day  Check level    Dwyane Luo Ph.D.  [facility] [address], [place], [place] [address] Ph: [phone] Fax: [phone]  CC: Referring Provider

## 2018-12-28 ENCOUNTER — Encounter (HOSPITAL_COMMUNITY)
Admission: RE | Admit: 2018-12-28 | Discharge: 2018-12-28 | Disposition: A | Discharge status: Home / Self Care | Payer: 59 | Source: Ambulatory Visit | Attending: Hematology and Oncology | Admitting: Hematology and Oncology

## 2018-12-28 ENCOUNTER — Other Ambulatory Visit: Payer: Self-pay

## 2018-12-28 ENCOUNTER — Inpatient Hospital Stay: Payer: 59

## 2018-12-28 DIAGNOSIS — C52 Malignant neoplasm of vagina: Secondary | ICD-10-CM

## 2018-12-28 LAB — COMPREHENSIVE METABOLIC PANEL
ALT: 17 U/L (ref 0–44)
AST: 16 U/L (ref 15–41)
Albumin: 4.2 g/dL (ref 3.5–5.0)
Alkaline Phosphatase: 85 U/L (ref 38–126)
Anion gap: 7 (ref 5–15)
BUN: 12 mg/dL (ref 6–20)
CO2: 27 mmol/L (ref 22–32)
Calcium: 9.7 mg/dL (ref 8.9–10.3)
Chloride: 107 mmol/L (ref 98–111)
Creatinine, Ser: 0.79 mg/dL (ref 0.44–1.00)
GFR calc Af Amer: >60 mL/min (ref >60)
GFR calc non Af Amer: >60 mL/min (ref >60)
Glucose, Bld: 92 mg/dL (ref 70–99)
Potassium: 4.2 mmol/L (ref 3.5–5.1)
Sodium: 141 mmol/L (ref 135–145)
Total Bilirubin: 0.3 mg/dL (ref 0.3–1.2)
Total Protein: 7.3 g/dL (ref 6.5–8.1)

## 2018-12-28 LAB — CBC WITH DIFFERENTIAL/PLATELET
Abs Immature Granulocytes: 0.01 10*3/uL (ref 0.00–0.07)
Basophils Absolute: 0 10*3/uL (ref 0.0–0.1)
Basophils Relative: 1 %
Eosinophils Absolute: 0.2 10*3/uL (ref 0.0–0.5)
Eosinophils Relative: 5 %
HCT: 42.7 % (ref 36.0–46.0)
Hemoglobin: 13.5 g/dL (ref 12.0–15.0)
Immature Granulocytes: 0 %
Lymphocytes Relative: 34 %
Lymphs Abs: 1.3 10*3/uL (ref 0.7–4.0)
MCH: 26.4 pg (ref 26.0–34.0)
MCHC: 31.6 g/dL (ref 30.0–36.0)
MCV: 83.4 fL (ref 80.0–100.0)
Monocytes Absolute: 0.4 10*3/uL (ref 0.1–1.0)
Monocytes Relative: 10 %
Neutro Abs: 1.8 10*3/uL (ref 1.7–7.7)
Neutrophils Relative %: 50 %
Platelets: 187 10*3/uL (ref 150–400)
RBC: 5.12 MIL/uL — ABNORMAL HIGH (ref 3.87–5.11)
RDW: 11.8 % (ref 11.5–15.5)
WBC: 3.7 10*3/uL — ABNORMAL LOW (ref 4.0–10.5)
nRBC: 0 % (ref 0.0–0.2)

## 2018-12-28 LAB — MAGNESIUM: Magnesium: 1.8 mg/dL (ref 1.7–2.4)

## 2018-12-28 LAB — GLUCOSE, CAPILLARY: Glucose-Capillary: 91 mg/dL (ref 70–99)

## 2018-12-28 MED ORDER — SODIUM CHLORIDE 0.9% FLUSH
10.0000 mL | Freq: Once | INTRAVENOUS | Status: AC
  Administered 2018-12-28: 09:00:00 10 mL
  Filled 2018-12-28: qty 10

## 2018-12-28 MED ORDER — FLUDEOXYGLUCOSE F - 18 (FDG) INJECTION
7.5200 | Freq: Once | INTRAVENOUS | Status: AC
  Administered 2018-12-28: 7.52 via INTRAVENOUS

## 2018-12-29 ENCOUNTER — Inpatient Hospital Stay (HOSPITAL_BASED_OUTPATIENT_CLINIC_OR_DEPARTMENT_OTHER): Payer: 59 | Admitting: Gynecologic Oncology

## 2018-12-29 ENCOUNTER — Other Ambulatory Visit: Payer: Self-pay

## 2018-12-29 ENCOUNTER — Encounter: Payer: Self-pay | Admitting: Gynecologic Oncology

## 2018-12-29 VITALS — BP 121/88 | HR 92 | Temp 98.3°F | Resp 18 | Ht 66.0 in | Wt 148.0 lb

## 2018-12-29 DIAGNOSIS — C775 Secondary and unspecified malignant neoplasm of intrapelvic lymph nodes: Secondary | ICD-10-CM | POA: Diagnosis not present

## 2018-12-29 DIAGNOSIS — Z9079 Acquired absence of other genital organ(s): Secondary | ICD-10-CM

## 2018-12-29 DIAGNOSIS — Z90710 Acquired absence of both cervix and uterus: Secondary | ICD-10-CM | POA: Diagnosis not present

## 2018-12-29 DIAGNOSIS — Z923 Personal history of irradiation: Secondary | ICD-10-CM | POA: Diagnosis not present

## 2018-12-29 DIAGNOSIS — C52 Malignant neoplasm of vagina: Secondary | ICD-10-CM | POA: Diagnosis not present

## 2018-12-29 NOTE — Patient Instructions (Signed)
Please notify Dr Denman George at phone number [phone] if you notice vaginal bleeding, new pelvic or abdominal pains, bloating, feeling full easy, or a change in bladder or bowel function.   When you check out from Dr Clabe Seal appointment in August please ask them to contact Dr Serita Grit office to schedule an appointment with her 3 months later (in November, 2020).   Your PET scan showed no signs of active cancer.

## 2018-12-29 NOTE — Progress Notes (Signed)
Follow-up Note: Gyn-Onc   Kelly Mcclure 38 y.o. female  Chief Complaint  Patient presents with  . 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 Positive deep margin (rectovaginal septum).  S/p adjuvant whole pelvic RT with vaginal brachytherapy and radiosensitizing CDDP completed February, 2020.  Complete clinical response on post-treatment PET and physical exam.   Plan:  Recommend continued surveillance at 3 monthly intervals x 2 years. Recommend annual paps to evaluate for new HPV related disease/dysplasia.  Counseled regarding importance of dilator use to prevent vaginal narrowing/shortening to optimize sexual function and physical examinations.   Counseled regarding symptoms of menopause and to notify us of these should they develop (her ovaries were pexed at the pelvic brim and may have avoided radiation exposure).  HPI: Patient had an abnormal Pap smear in 2011. 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 Surgical margins were positive. Therefore, the patient underwent a cold knife conization on 04/01/2010. Pathology from that specimen showed severe dysplasia with negative margins. The patient was subsequently followed with Pap smears. A pap in March 2012 was ascus. ECC was negative. Repeat ECC in June 2012 was negative. Pap smear in 03/03/2012 showed high-grade SIL and again ECC was negative. Conservative followup was recommended.  Repeat colposcopy in December 2013 identified a lesion in the posterior vaginal fornix that was biopsied. Final pathology only showed atypia. A pap smear on that same day only showed LSIL.  A Pap smear on 10/01/2012 showed high-grade SIL.  Pap smear in March was low grade SIL. Patient subsequently underwent cervical dilatation, endocervical curettage, and vaginal biopsy (03/01/2013). The only abnormality found was atypia  on vaginal biopsy.  October 2014 Pap smear with low-grade SIL.  March 21, 2016 Pap smear showed HGSIL.  Endocervical curettage showed benign endocervical glands.  Pap smear July 15, 2016 showed HGSIL.  Endocervical curettage showed benign endocervical glands.  January 13, 2018 Pap smear showed HGSIL.  Exam by Dr Fermin Schwab on July 9th showed a friable exophytic lesion on the cervix which was biopsied. Final pathology showed "at least CIN III, invasion could not be ruled out". Colpo had been difficult as the cervix was flush with the vagina and the upper vagina was narrowed.  The patient was interested in a definitive hysterectomy procedure however she required an excisional procedure to rule out invasive carcinoma before having this.  On 02/18/18 she underwent cold knife conization of the cervix in the operating room.  Inspection of the cervix with acetic acid application was grossly unremarkable with no apparent lesions seen.  The upper vagina was also grossly normal.  The cervix was small, fairly flush with the surrounding vagina, however the conization procedure was relatively straightforward.  A 0.9 cm AP dimension by 1.5 cm deep dimension cone was taken without difficulty.  There is minimal bleeding in the operating room.  The patient began experiencing high fevers to 101 and 102 on postoperative day 4 and was prescribed empiric antibiotics. Exam revealed no concerning signs.  Final pathology revealed no residual dysplasia.  On postoperative examination a friable lesion was seen on the posterior upper vaginal wall measuring approximately 2cm. It was separate to, but abutting, the posterior lip of the cervix. 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   The patient was recommended surgical excision of the lesion, and due to its proximity with the  cervix, was recommended hysterectomy (without oophorectomy) at the time of  surgery in order to establish adequate margins.  The patient had her surgery scheduled (robotic hysterectomy, upper vaginectomy), however canceled this after she discussed the proposed surgery with her referring doctor, Dr Ronita Hipps, who had concerns about the surgical plan. Dr Ronita Hipps requested that [name] see my partner, Dr Fermin Schwab for a second opinion.   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  Given his shared concern that this lesion may harbor occult invasive disease, and due to its proximity to the cervix, he agreed with the surgical plan of hysterectomy (not oophorectomy) with salpingectomy and upper vaginectomy.   On June 01, 2018 the patient underwent a robotic assisted total hysterectomy with upper vaginectomy bilateral salpingectomy and due for pexy.  The surgical procedure was unremarkable and uncomplicated.  Operative findings were significant for a grossly normal uterus and cervix and tubes and ovaries.  A 3 x 3 cm discoid raised erythematous lesion was visualized vaginally on the posterior upper wall of the vagina with close proximity to the cervix (approximately 7 mm margin to the cervicovaginal junction.  At the completion of the procedure there was no visible remaining lesion in the vagina.  Via intraperitoneal inspection there was no eruption of the tumor through into the intraperitoneal space.  The rectovaginal septum was created with a ease and there was no gross involvement of the anterior rectal wall during surgery.  Inspection of the specimen revealed the margins to be grossly clear of disease.  At the completion of the procedure the ovaries were pexed to the pelvic brim.  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  The carcinoma  involved the cauterized deep resection margin representing the rectovaginal septal margin.  Lymphovascular space invasion was present.  The cervix and uterus and fallopian tubes are grossly unremarkable.  Interval History:  Postop PET showed a PET avid Rt pelvic lymph node consistent with metastatic disease (stage IIIC).   She was treated with radiosensitizing weekly CDDP with whole pelvic RT and vaginal brachytherapy (the pelvis received 25 fractions of 1.8 Gy for a total of 45 Gray, there was a pelvic nodal boost of 8 fractions of 1.8 Gray for a total of 14.4 Gy, cumulative dose to involve nodes of 59.4 Gy; the vagina received 6 Gy in 4 fractions for total dose of 24 Gray).  Treatment dates 07-14-18 to 09/27/18.   Repeat pet imaging 3 months post treatment was performed on Dec 28, 2018 and revealed response to therapy of the right pelvic nodal metastases with no increased uptake in the nodal basins and no lymphadenopathy.  There is no evidence of residual recurrent disease.  There was similar findings to the bilateral pulmonary nodules which appeared benign and stable from prior imaging.  She tolerated treatment well with no major toxicities.   Review of Systems:10 point review of systems is negative except as noted in interval history.   Vitals: Blood pressure 121/88, pulse 92, temperature 98.3 F (36.8 C), temperature source Oral, resp. rate 18, height '5\' 6"'  (1.676 m), weight 148 lb (67.1 kg), last menstrual period 05/17/2018, SpO2 100 %, not currently breastfeeding.  Physical Exam: General : The patient is a healthy woman in no acute distress.  HEENT: normocephalic, extraoccular movements normal; neck is supple without thyromegally  Lynphnodes: Supraclavicular and inguinal nodes not enlarged  Abdomen: Soft, non-tender, no ascites, no organomegally, no masses, no hernias. Soft Pelvic:  Vaginal cuff with no lesions, slight upper vaginal narrowing.   Lower extremities: No edema or  varicosities. Normal range of motion   Allergies  Allergen Reactions  . Sulfa Antibiotics Swelling    Tongue swelling, throat irritated  . Adhesive [Tape] Other (See Comments)    "skin burn"  . Other Swelling and Other (See Comments)    Magic mouth wash/ causes swelling of the tongue  . Relpax [Eletriptan Hydrobromide] Other (See Comments)    Stroke-like symptoms  . Ultram [Tramadol] Other (See Comments)    2004--  Per pt had seizure due to interaction with other medication she was taking at the time    Past Medical History:  Diagnosis Date  . Cancer [facility]) DX 2011   CERVICAL   . Cough    WITH SEASONAL ALLEGIES, NONPRODUCTIVE LAST 2 DAYS  . H/O radioactive iodine thyroid ablation 12/2009  . 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 History of cervical dysplasia   . History of hyperthyroidism    per pt dx 2000 -- s/p RAI  2011  . History of kidney stones 2011  . Hx of varicella   . Hypothyroidism, postradioiodine therapy    endocrinology-  dr Cruzita Lederer  . Migraines   . Mild persistent asthma 01/19/2018   followed by dr Verlin Fester ([facility]) MILD (SUBCLINICAL PER PT)  . 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 Vaginal Pap smear, abnormal     Past Surgical History:  Procedure Laterality Date  . ABDOMINAL CERCLAGE N/A 11/19/2016   Procedure: LAPAROSCOPIC TRANSABDOMINAL CERVICAL-ISTHMUSx2;  Surgeon: Governor Specking, MD;  Location: [facility];  Service: Gynecology;  Laterality: N/A;  with ultrasound guidance. Start laparoscopically HOLD OPEN ABDOMINAL ITEMS   . CERCLAGE LAPAROSCOPIC ABDOMINAL N/A 11/19/2016   Procedure: CERCLAGE LAPAROSCOPIC ABDOMINAL;  Surgeon: Governor Specking, MD;  Location: [facility];  Service: Gynecology;  Laterality: N/A;  . CERVICAL CONIZATION W/BX N/A 02/18/2018   Procedure: COLD KNIFE  CONIZATION OF CERVIX WITH POSSIBLE BIOPSY;  Surgeon: [name], MD;  Location: [place];  Service: Gynecology;  Laterality: N/A;  . CESAREAN SECTION N/A 05/27/2017   Procedure: Primary CESAREAN SECTION;  Surgeon: Brien Few, MD;  Location: [place];  Service: Obstetrics;  Laterality: N/A;  EDD: 11/4/18Allergy: Adhesive, Relpax, Sulfa, Ultram  . COLD KNIFE CONIZATION  03-29-2010   dr Ronita Hipps  [facility]  . DILATION AND CURETTAGE OF UTERUS  2009  . DILATION AND CURETTAGE OF UTERUS N/A 03/01/2013   Procedure: CERVICAL DILATATION AND ENDOCERVICAL CURRETAGE AND VAAGINAL BIOPIES;  Surgeon: Alvino Chapel, MD;  Location: [facility];  Service: Gynecology;  Laterality: N/A;  . IR IMAGING GUIDED PORT INSERTION  07/12/2018  . LEEP  02/25/2010;  12/ 2012  dr Ronita Hipps office  . NASAL SEPTUM SURGERY  2006   Repair of deviated septum  . SIGMOIDOSCOPY  2009  . TONSILLECTOMY AND ADENOIDECTOMY  1995    Current Outpatient Medications  Medication Sig Dispense Refill  . butalbital-acetaminophen-caffeine (FIORICET, ESGIC) 50-325-40 MG tablet Take 1-2 tablets by mouth every 4 (four) hours as needed for migraine.     Marland Kitchen ibuprofen (ADVIL,MOTRIN) 800 MG tablet Take 1 tablet (800 mg  total) by mouth every 8 (eight) hours as needed. 30 tablet 0  . LamoTRIgine 100 MG TB24 24 hour tablet Take 1 tablet (100 mg total) by mouth at bedtime. 90 tablet 4  . LamoTRIgine 200 MG TB24 24 hour tablet Take 2 tablets (400 mg total) by mouth 2 (two) times daily. 360 tablet 4  . levETIRAcetam (KEPPRA) 750 MG tablet Take 2 tablets (1,500 mg total) by mouth 2 (two) times daily. 360 tablet 4  . lidocaine-prilocaine (EMLA) cream Apply to affected area once 30 g 3  . Multiple Vitamins-Minerals (CENTRUM WOMEN) TABS Take 1 tablet by mouth daily.    Marland Kitchen SYNTHROID 150 MCG tablet Take 1 tablet (150 mcg total) by mouth daily before breakfast. 90 tablet 3  . loratadine (CLARITIN) 10 MG tablet Take 10 mg by mouth every  morning.     . montelukast (SINGULAIR) 10 MG tablet TAKE 1 TABLET BY MOUTH EVERYDAY AT BEDTIME (Patient not taking: Reported on 12/29/2018) 30 tablet 0   No current facility-administered medications for this visit.     Social History   Socioeconomic History  . Marital status: Single    Spouse name: Not on file  . Number of children: 1  . Years of education: Not on file  . Highest education level: Not on file  Occupational History  . Occupation: Triage  Social Needs  . Financial resource strain: Not on file  . Food insecurity:    Worry: Not on file    Inability: Not on file  . Transportation needs:    Medical: Not on file    Non-medical: Not on file  Tobacco Use  . Smoking status: Never Smoker  . Smokeless tobacco: Never Used  Substance and Sexual Activity  . Alcohol use: Not Currently    Comment: SELDOM, NONE IN  10 MONTHS  . Drug use: No  . Sexual activity: Yes    Birth control/protection: None  Lifestyle  . Physical activity:    Days per week: Not on file    Minutes per session: Not on file  . Stress: Not on file  Relationships  . Social connections:    Talks on phone: Not on file    Gets together: Not on file    Attends religious service: Not on file    Active member of club or organization: Not on file    Attends meetings of clubs or organizations: Not on file    Relationship status: Not on file  . Intimate partner violence:    Fear of current or ex partner: Not on file    Emotionally abused: Not on file    Physically abused: Not on file    Forced sexual activity: Not on file  Other Topics Concern  . Not on file  Social History Narrative  . Not on file    Family History  Problem Relation Age of Onset  . Leukemia Other   . Diabetes Other   . Diabetes Mother   . Skin cancer Mother   . Hyperlipidemia Mother   . Hypertension Father   . Asthma Father   . Skin cancer Maternal Aunt   . Multiple myeloma Maternal Aunt   . Testicular cancer Maternal Uncle    . Hyperlipidemia Maternal Grandmother   . AAA (abdominal aortic aneurysm) Maternal Grandmother   . COPD Maternal Grandmother   . Prostate cancer Maternal Grandfather   . AAA (abdominal aortic aneurysm) Maternal Grandfather     Thereasa Solo, MD 12/29/2018, 1:46 PM

## 2018-12-30 ENCOUNTER — Telehealth: Payer: Self-pay | Admitting: Oncology

## 2018-12-30 ENCOUNTER — Other Ambulatory Visit: Payer: Self-pay | Admitting: Hematology and Oncology

## 2018-12-30 ENCOUNTER — Encounter: Payer: Self-pay | Admitting: Hematology and Oncology

## 2018-12-30 DIAGNOSIS — C52 Malignant neoplasm of vagina: Secondary | ICD-10-CM

## 2018-12-30 NOTE — Telephone Encounter (Signed)
Called Kelly Mcclure and advised her that Dr. Alvy Bimler has suggested port removal and an order has been placed.  Discussed that she will be receiving a call to schedule it with IR.  She verbalized understanding and agreement.

## 2019-01-03 ENCOUNTER — Encounter: Payer: Self-pay | Admitting: Hematology and Oncology

## 2019-01-03 ENCOUNTER — Encounter: Payer: Self-pay | Admitting: Gynecologic Oncology

## 2019-01-04 ENCOUNTER — Encounter: Payer: Self-pay | Admitting: Gynecologic Oncology

## 2019-01-04 ENCOUNTER — Other Ambulatory Visit: Payer: 59

## 2019-01-10 ENCOUNTER — Encounter: Payer: Self-pay | Admitting: Gynecologic Oncology

## 2019-01-12 ENCOUNTER — Other Ambulatory Visit: Payer: Self-pay | Admitting: Radiology

## 2019-01-13 ENCOUNTER — Other Ambulatory Visit: Payer: Self-pay | Admitting: Radiology

## 2019-01-14 ENCOUNTER — Encounter (HOSPITAL_COMMUNITY): Payer: Self-pay

## 2019-01-14 ENCOUNTER — Ambulatory Visit (HOSPITAL_COMMUNITY)
Admission: RE | Admit: 2019-01-14 | Discharge: 2019-01-14 | Disposition: A | Discharge status: Home / Self Care | Payer: 59 | Source: Ambulatory Visit | Attending: Hematology and Oncology | Admitting: Hematology and Oncology

## 2019-01-14 ENCOUNTER — Other Ambulatory Visit: Payer: Self-pay

## 2019-01-14 DIAGNOSIS — C52 Malignant neoplasm of vagina: Secondary | ICD-10-CM | POA: Diagnosis not present

## 2019-01-14 DIAGNOSIS — J4530 Mild persistent asthma, uncomplicated: Secondary | ICD-10-CM | POA: Diagnosis not present

## 2019-01-14 DIAGNOSIS — Z791 Long term (current) use of non-steroidal anti-inflammatories (NSAID): Secondary | ICD-10-CM | POA: Insufficient documentation

## 2019-01-14 DIAGNOSIS — Z888 Allergy status to other drugs, medicaments and biological substances status: Secondary | ICD-10-CM | POA: Insufficient documentation

## 2019-01-14 DIAGNOSIS — G40909 Epilepsy, unspecified, not intractable, without status epilepticus: Secondary | ICD-10-CM | POA: Insufficient documentation

## 2019-01-14 DIAGNOSIS — Z885 Allergy status to narcotic agent status: Secondary | ICD-10-CM | POA: Insufficient documentation

## 2019-01-14 DIAGNOSIS — Z79899 Other long term (current) drug therapy: Secondary | ICD-10-CM | POA: Diagnosis not present

## 2019-01-14 DIAGNOSIS — Z452 Encounter for adjustment and management of vascular access device: Secondary | ICD-10-CM | POA: Insufficient documentation

## 2019-01-14 DIAGNOSIS — Z882 Allergy status to sulfonamides status: Secondary | ICD-10-CM | POA: Insufficient documentation

## 2019-01-14 HISTORY — PX: IR REMOVAL TUN ACCESS W/ PORT W/O FL MOD SED: IMG2290

## 2019-01-14 LAB — PROTIME-INR
INR: 0.9 (ref 0.8–1.2)
Prothrombin Time: 12.1 seconds (ref 11.4–15.2)

## 2019-01-14 LAB — CBC WITH DIFFERENTIAL/PLATELET
Abs Immature Granulocytes: 0.02 10*3/uL (ref 0.00–0.07)
Basophils Absolute: 0 10*3/uL (ref 0.0–0.1)
Basophils Relative: 1 %
Eosinophils Absolute: 0.3 10*3/uL (ref 0.0–0.5)
Eosinophils Relative: 7 %
HCT: 42.9 % (ref 36.0–46.0)
Hemoglobin: 13.6 g/dL (ref 12.0–15.0)
Immature Granulocytes: 1 %
Lymphocytes Relative: 31 %
Lymphs Abs: 1.2 10*3/uL (ref 0.7–4.0)
MCH: 26.4 pg (ref 26.0–34.0)
MCHC: 31.7 g/dL (ref 30.0–36.0)
MCV: 83.1 fL (ref 80.0–100.0)
Monocytes Absolute: 0.4 10*3/uL (ref 0.1–1.0)
Monocytes Relative: 11 %
Neutro Abs: 2 10*3/uL (ref 1.7–7.7)
Neutrophils Relative %: 49 %
Platelets: 198 10*3/uL (ref 150–400)
RBC: 5.16 MIL/uL — ABNORMAL HIGH (ref 3.87–5.11)
RDW: 12 % (ref 11.5–15.5)
WBC: 3.9 10*3/uL — ABNORMAL LOW (ref 4.0–10.5)
nRBC: 0 % (ref 0.0–0.2)

## 2019-01-14 MED ORDER — LIDOCAINE HCL (PF) 1 % IJ SOLN
INTRAMUSCULAR | Status: AC | PRN
  Administered 2019-01-14: 10 mL

## 2019-01-14 MED ORDER — FENTANYL CITRATE (PF) 100 MCG/2ML IJ SOLN
INTRAMUSCULAR | Status: AC
  Filled 2019-01-14: qty 2

## 2019-01-14 MED ORDER — CEFAZOLIN SODIUM-DEXTROSE 2-4 GM/100ML-% IV SOLN
INTRAVENOUS | Status: AC
  Administered 2019-01-14: 2 g via INTRAVENOUS
  Filled 2019-01-14: qty 100

## 2019-01-14 MED ORDER — CEFAZOLIN SODIUM-DEXTROSE 2-4 GM/100ML-% IV SOLN
2.0000 g | INTRAVENOUS | Status: AC
  Administered 2019-01-14: 10:00:00 2 g via INTRAVENOUS

## 2019-01-14 MED ORDER — MIDAZOLAM HCL 2 MG/2ML IJ SOLN
INTRAMUSCULAR | Status: AC
  Filled 2019-01-14: qty 4

## 2019-01-14 MED ORDER — HEPARIN SOD (PORK) LOCK FLUSH 100 UNIT/ML IV SOLN
INTRAVENOUS | Status: AC | PRN
  Administered 2019-01-14: 500 [IU] via INTRAVENOUS

## 2019-01-14 MED ORDER — MIDAZOLAM HCL 2 MG/2ML IJ SOLN
INTRAMUSCULAR | Status: AC | PRN
  Administered 2019-01-14 (×2): 1 mg via INTRAVENOUS

## 2019-01-14 MED ORDER — SODIUM CHLORIDE 0.9 % IV SOLN
INTRAVENOUS | Status: DC
  Administered 2019-01-14: 08:00:00 via INTRAVENOUS

## 2019-01-14 MED ORDER — FENTANYL CITRATE (PF) 100 MCG/2ML IJ SOLN
INTRAMUSCULAR | Status: AC | PRN
  Administered 2019-01-14 (×2): 50 ug via INTRAVENOUS

## 2019-01-14 MED ORDER — LIDOCAINE HCL 1 % IJ SOLN
INTRAMUSCULAR | Status: AC
  Filled 2019-01-14: qty 20

## 2019-01-14 NOTE — Discharge Instructions (Signed)
Implanted Port Removal, Care After °This sheet gives you information about how to care for yourself after your procedure. Your health care provider may also give you more specific instructions. If you have problems or questions, contact your health care provider. °What can I expect after the procedure? °After the procedure, it is common to have: °· Soreness or pain near your incision. °· Some swelling or bruising near your incision. °Follow these instructions at home: °Medicines °· Take over-the-counter and prescription medicines only as told by your health care provider. °· If you were prescribed an antibiotic medicine, take it as told by your health care provider. Do not stop taking the antibiotic even if you start to feel better. °Bathing °· Do not take baths, swim, or use a hot tub until your health care provider approves. Ask your health care provider if you can take showers. You may only be allowed to take sponge baths. °Incision care ° °· Follow instructions from your health care provider about how to take care of your incision. Make sure you: °? Wash your hands with soap and water before you change your bandage (dressing). If soap and water are not available, use hand sanitizer. °? Change your dressing as told by your health care provider. °? Keep your dressing dry. °? Leave stitches (sutures), skin glue, or adhesive strips in place. These skin closures may need to stay in place for 2 weeks or longer. If adhesive strip edges start to loosen and curl up, you may trim the loose edges. Do not remove adhesive strips completely unless your health care provider tells you to do that. °· Check your incision area every day for signs of infection. Check for: °? More redness, swelling, or pain. °? More fluid or blood. °? Warmth. °? Pus or a bad smell. °Driving ° °· Do not drive for 24 hours if you were given a medicine to help you relax (sedative) during your procedure. °· If you did not receive a sedative, ask your  health care provider when it is safe to drive. °Activity °· Return to your normal activities as told by your health care provider. Ask your health care provider what activities are safe for you. °· Do not lift anything that is heavier than 10 lb (4.5 kg), or the limit that you are told, until your health care provider says that it is safe. °· Do not do activities that involve lifting your arms over your head. °General instructions °· Do not use any products that contain nicotine or tobacco, such as cigarettes and e-cigarettes. These can delay healing. If you need help quitting, ask your health care provider. °· Keep all follow-up visits as told by your health care provider. This is important. °Contact a health care provider if: °· You have more redness, swelling, or pain around your incision. °· You have more fluid or blood coming from your incision. °· Your incision feels warm to the touch. °· You have pus or a bad smell coming from your incision. °· You have pain that is not relieved by your pain medicine. °Get help right away if you have: °· A fever or chills. °· Chest pain. °· Difficulty breathing. °Summary °· After the procedure, it is common to have pain, soreness, swelling, or bruising near your incision. °· If you were prescribed an antibiotic medicine, take it as told by your health care provider. Do not stop taking the antibiotic even if you start to feel better. °· Do not drive for 24 hours   if you were given a sedative during your procedure. °· Return to your normal activities as told by your health care provider. Ask your health care provider what activities are safe for you. °This information is not intended to replace advice given to you by your health care provider. Make sure you discuss any questions you have with your health care provider. °Document Released: 07/02/2015 Document Revised: 09/03/2017 Document Reviewed: 09/03/2017 °Elsevier Interactive Patient Education © 2019 [employer] ° ° °Moderate Conscious Sedation, Adult, Care After °These instructions provide you with information about caring for yourself after your procedure. Your health care provider may also give you more specific instructions. Your treatment has been planned according to current medical practices, but problems sometimes occur. Call your health care provider if you have any problems or questions after your procedure. °What can I expect after the procedure? °After your procedure, it is common: °· To feel sleepy for several hours. °· To feel clumsy and have poor balance for several hours. °· To have poor judgment for several hours. °· To vomit if you eat too soon. °Follow these instructions at home: °For at least 24 hours after the procedure: ° °· Do not: °? Participate in activities where you could fall or become injured. °? Drive. °? Use heavy machinery. °? Drink alcohol. °? Take sleeping pills or medicines that cause drowsiness. °? Make important decisions or sign legal documents. °? Take care of children on your own. °· Rest. °Eating and drinking °· Follow the diet recommended by your health care provider. °· If you vomit: °? Drink water, juice, or soup when you can drink without vomiting. °? Make sure you have little or no nausea before eating solid foods. °General instructions °· Have a responsible adult stay with you until you are awake and alert. °· Take over-the-counter and prescription medicines only as told by your health care provider. °· If you smoke, do not smoke without supervision. °· Keep all follow-up visits as told by your health care provider. This is important. °Contact a health care provider if: °· You keep feeling nauseous or you keep vomiting. °· You feel light-headed. °· You develop a rash. °· You have a fever. °Get help right away if: °· You have trouble breathing. °This information is not intended to replace advice given to you by your health care provider. Make sure you discuss any questions  you have with your health care provider. °Document Released: 05/11/2013 Document Revised: 12/24/2015 Document Reviewed: 11/10/2015 °Elsevier Interactive Patient Education © 2019 [employer] ° °

## 2019-01-14 NOTE — Procedures (Signed)
Interventional Radiology Procedure Note  Procedure: Porta-cath removal  Complications: None  Estimated Blood Loss: < 10 mL  Findings: Right chest port removed in entirety. Wound closed.  Venetia Night. Kathlene Cote, M.D Pager:  [phone]

## 2019-01-14 NOTE — H&P (Signed)
Referring Physician(s): Heath Lark  Supervising Physician: Aletta Edouard  Patient Status:  Kelly Mcclure OP  Chief Complaint: "I'm getting my port out"   Subjective: Patient familiar to IR service from Port-A-Cath placement on 07/12/2018.  She has a history of vaginal cancer, status post surgery/chemoradiation/vaginal brachytherapy.  She has no evidence of recurrent disease on recent PET scan and presents today for Port-A-Cath removal.  She currently denies fever, headache, chest pain, dyspnea, cough, abdominal/back pain, nausea, vomiting or bleeding.  Past Medical History:  Diagnosis Date  . Cancer [facility]) DX 2011   CERVICAL   . Cough    WITH SEASONAL ALLEGIES, NONPRODUCTIVE LAST 2 DAYS  . H/O radioactive iodine thyroid ablation 12/2009  . 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 History of cervical dysplasia   . History of hyperthyroidism    per pt dx 2000 -- s/p RAI  2011  . History of kidney stones 2011  . Hx of varicella   . Hypothyroidism, postradioiodine therapy    endocrinology-  dr Cruzita Lederer  . Migraines   . Mild persistent asthma 01/19/2018   followed by dr Verlin Fester (allergy and asthma center) MILD (SUBCLINICAL PER PT)  . 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 Vaginal Pap smear, abnormal    Past Surgical History:  Procedure Laterality Date  . ABDOMINAL CERCLAGE N/A 11/19/2016   Procedure: LAPAROSCOPIC TRANSABDOMINAL CERVICAL-ISTHMUSx2;  Surgeon: Governor Specking, MD;  Location: [place] [facility];  Service: Gynecology;  Laterality: N/A;  with ultrasound guidance. Start laparoscopically HOLD OPEN ABDOMINAL ITEMS   . CERCLAGE LAPAROSCOPIC ABDOMINAL N/A 11/19/2016   Procedure: CERCLAGE LAPAROSCOPIC ABDOMINAL;  Surgeon: Governor Specking, MD;  Location: [place] [facility];  Service: Gynecology;  Laterality: N/A;  . CERVICAL CONIZATION  W/BX N/A 02/18/2018   Procedure: COLD KNIFE CONIZATION OF CERVIX WITH POSSIBLE BIOPSY;  Surgeon: [name], MD;  Location: [place];  Service: Gynecology;  Laterality: N/A;  . CESAREAN SECTION N/A 05/27/2017   Procedure: Primary CESAREAN SECTION;  Surgeon: Brien Few, MD;  Location: [place];  Service: Obstetrics;  Laterality: N/A;  EDD: 11/4/18Allergy: Adhesive, Relpax, Sulfa, Ultram  . COLD KNIFE CONIZATION  03-29-2010   dr Ronita Hipps  [facility]  . DILATION AND CURETTAGE OF UTERUS  2009  . DILATION AND CURETTAGE OF UTERUS N/A 03/01/2013   Procedure: CERVICAL DILATATION AND ENDOCERVICAL CURRETAGE AND VAAGINAL BIOPIES;  Surgeon: Alvino Chapel, MD;  Location[facility];  Service: Gynecology;  Laterality: N/A;  . IR IMAGING GUIDED PORT INSERTION  07/12/2018  . LEEP  02/25/2010;  12/ 2012  dr Ronita Hipps office  . NASAL SEPTUM SURGERY  2006   Repair of deviated septum  . SIGMOIDOSCOPY  2009  . TONSILLECTOMY AND ADENOIDECTOMY  1995      Allergies: Sulfa antibiotics, Adhesive [tape], Other, Relpax [eletriptan hydrobromide], and Ultram [tramadol]  Medications: Prior to Admission medications   Medication Sig Start Date End Date Taking? Authorizing Provider  butalbital-acetaminophen-caffeine (FIORICET, ESGIC) 50-325-40 MG tablet Take 1-2 tablets by mouth every 4 (four) hours as needed for migraine.    Yes [provider]  ibuprofen (ADVIL,MOTRIN) 800 MG tablet Take 1 tablet (800 mg total) by mouth every 8 (eight) hours as needed. 06/02/18  Yes [name] D, NP  LamoTRIgine 100 MG TB24 24 hour tablet Take 1 tablet (100 mg  total) by mouth at bedtime. 12/23/18  Yes Marcial Pacas, MD  LamoTRIgine 200 MG TB24 24 hour tablet Take 2 tablets (400 mg total) by mouth 2 (two) times daily. 12/23/18  Yes Marcial Pacas, MD  levETIRAcetam (KEPPRA) 750 MG tablet Take 2 tablets (1,500 mg total) by mouth 2 (two) times daily. 12/23/18  Yes Marcial Pacas, MD  SYNTHROID 150 MCG tablet Take 1  tablet (150 mcg total) by mouth daily before breakfast. 03/25/18  Yes Philemon Kingdom, MD  lidocaine-prilocaine (EMLA) cream Apply to affected area once 07/08/18   Heath Lark, MD  loratadine (CLARITIN) 10 MG tablet Take 10 mg by mouth every morning.     [provider]  montelukast (SINGULAIR) 10 MG tablet TAKE 1 TABLET BY MOUTH EVERYDAY AT BEDTIME Patient not taking: Reported on 12/29/2018 07/12/18   Bobbitt, Sedalia Muta, MD  Multiple Vitamins-Minerals (CENTRUM WOMEN) TABS Take 1 tablet by mouth daily.    [provider]     Vital Signs: Blood pressure 130/88, heart rate 96, temp 98.4, respirations 17, O2 sats 99% room air LMP 05/17/2018 (Exact Date)   Physical Exam awake, alert.  Chest clear to auscultation bilaterally.  Clean, intact right chest wall Port-A-Cath.  Heart with regular rate and rhythm.  Abdomen soft, positive bowel sounds, nontender.  No lower extremity edema.  Imaging: No results found.  Labs:  CBC: Recent Labs    10/12/18 1038 11/23/18 1017 12/28/18 0908 01/14/19 0805  WBC 3.8* 4.2 3.7* 3.9*  HGB 12.9 13.6 13.5 13.6  HCT 40.4 42.4 42.7 42.9  PLT 167 203 187 198    COAGS: Recent Labs    07/12/18 1041  INR 0.95  APTT 31    BMP: Recent Labs    08/31/18 1040 10/12/18 1038 11/23/18 1017 12/28/18 0908  NA 140 141 141 141  K 3.9 3.9 4.1 4.2  CL 105 105 106 107  CO2 25 25 25 27   GLUCOSE 90 109* 87 92  BUN 11 13 10 12   CALCIUM 9.3 9.5 9.3 9.7  CREATININE 0.72 0.75 0.80 0.79  GFRNONAA >60 >60 >60 >60  GFRAA >60 >60 >60 >60    LIVER FUNCTION TESTS: Recent Labs    08/31/18 1040 10/12/18 1038 11/23/18 1017 12/28/18 0908  BILITOT 0.4 0.4 0.4 0.3  AST 13* 18 18 16   ALT 17 17 20 17   ALKPHOS 83 93 100 85  PROT 7.2 7.3 7.4 7.3  ALBUMIN 4.2 4.2 4.3 4.2    Assessment and Plan: Pt with history of vaginal cancer, status post surgery/chemoradiation/vaginal brachytherapy.  She has no evidence of recurrent disease on recent PET  scan and presents today for Port-A-Cath removal.  Details/risks of procedure, including but not limited to, internal bleeding, infection, injury to adjacent structures discussed with patient with her understanding and consent.   Electronically Signed: D. Rowe Robert, PA-C 01/14/2019, 8:40 AM   I spent a total of 20 minutes  at the the patient's bedside AND on the patient's hospital floor or unit, greater than 50% of which was counseling/coordinating care for port a cath removal

## 2019-01-16 ENCOUNTER — Encounter: Payer: Self-pay | Admitting: Gynecologic Oncology

## 2019-02-17 ENCOUNTER — Other Ambulatory Visit: Payer: Self-pay | Admitting: Obstetrics and Gynecology

## 2019-02-22 ENCOUNTER — Encounter: Payer: Self-pay | Admitting: Internal Medicine

## 2019-02-24 ENCOUNTER — Encounter: Payer: Self-pay | Admitting: Internal Medicine

## 2019-02-24 ENCOUNTER — Other Ambulatory Visit: Payer: Self-pay | Admitting: Internal Medicine

## 2019-02-24 MED ORDER — SYNTHROID 137 MCG PO TABS: 137.0000 ug | ORAL_TABLET | Freq: Every day | ORAL | 3 refills | Status: DC

## 2019-02-24 NOTE — Progress Notes (Signed)
Received labs from Dr. Ronita Hipps from 02/22/2019: -TSH 0.02, free T4 1.92, free T3 4.1 -HbA1c 5.2% -Lipids: 192/259/43/97  We will decrease her Synthroid to 137 mcg daily.  I will see her back in 1.5 months for a visit and will check her TFTs then.

## 2019-03-29 ENCOUNTER — Ambulatory Visit: Payer: 59 | Admitting: Internal Medicine

## 2019-03-31 ENCOUNTER — Ambulatory Visit: Payer: Self-pay | Admitting: Radiation Oncology

## 2019-04-13 ENCOUNTER — Other Ambulatory Visit: Payer: Self-pay

## 2019-04-14 ENCOUNTER — Other Ambulatory Visit: Payer: Self-pay

## 2019-04-14 ENCOUNTER — Ambulatory Visit: Payer: BC Managed Care – PPO | Admitting: Internal Medicine

## 2019-04-14 ENCOUNTER — Ambulatory Visit
Admission: RE | Admit: 2019-04-14 | Discharge: 2019-04-14 | Disposition: A | Discharge status: Home / Self Care | Payer: BC Managed Care – PPO | Source: Ambulatory Visit | Attending: Radiation Oncology | Admitting: Radiation Oncology

## 2019-04-14 ENCOUNTER — Encounter: Payer: Self-pay | Admitting: Radiation Oncology

## 2019-04-14 ENCOUNTER — Encounter: Payer: Self-pay | Admitting: Internal Medicine

## 2019-04-14 VITALS — BP 114/76 | HR 99 | Temp 98.3°F | Resp 18 | Ht 66.0 in | Wt 150.0 lb

## 2019-04-14 VITALS — BP 120/70 | HR 105 | Ht 66.0 in | Wt 149.0 lb

## 2019-04-14 DIAGNOSIS — Z8579 Personal history of other malignant neoplasms of lymphoid, hematopoietic and related tissues: Secondary | ICD-10-CM | POA: Diagnosis not present

## 2019-04-14 DIAGNOSIS — C52 Malignant neoplasm of vagina: Secondary | ICD-10-CM

## 2019-04-14 DIAGNOSIS — R918 Other nonspecific abnormal finding of lung field: Secondary | ICD-10-CM | POA: Insufficient documentation

## 2019-04-14 DIAGNOSIS — Z79899 Other long term (current) drug therapy: Secondary | ICD-10-CM | POA: Insufficient documentation

## 2019-04-14 DIAGNOSIS — L659 Nonscarring hair loss, unspecified: Secondary | ICD-10-CM

## 2019-04-14 DIAGNOSIS — Z8589 Personal history of malignant neoplasm of other organs and systems: Secondary | ICD-10-CM | POA: Insufficient documentation

## 2019-04-14 DIAGNOSIS — Z923 Personal history of irradiation: Secondary | ICD-10-CM | POA: Insufficient documentation

## 2019-04-14 DIAGNOSIS — E890 Postprocedural hypothyroidism: Secondary | ICD-10-CM | POA: Diagnosis not present

## 2019-04-14 DIAGNOSIS — Z08 Encounter for follow-up examination after completed treatment for malignant neoplasm: Secondary | ICD-10-CM | POA: Diagnosis not present

## 2019-04-14 LAB — T4, FREE: Free T4: 1.12 ng/dL (ref 0.60–1.60)

## 2019-04-14 LAB — TSH: TSH: 0.15 u[IU]/mL — ABNORMAL LOW (ref 0.35–4.50)

## 2019-04-14 MED ORDER — SYNTHROID 125 MCG PO TABS: 125.0000 ug | ORAL_TABLET | Freq: Every day | ORAL | 3 refills | Status: DC

## 2019-04-14 NOTE — Patient Instructions (Signed)
Please continue Levothyroxine 137 mcg daily.  Take the thyroid hormone every day, in am, with water, at least 30 minutes before breakfast, separated by at least 4 hours from: - acid reflux medications - calcium - iron - multivitamins  If you start B complex, remember to skip the dose starting 2 days before labs.  Please come back for a follow-up appointment in 1 year.

## 2019-04-14 NOTE — Progress Notes (Signed)
Patient ID: Kelly Mcclure, female   DOB: May 14, 1981, 38 y.o.   MRN: [id number]    HPI  Kelly Mcclure is a 38 y.o.-year-old female, initially referred by her ObGyn Dr., Dr. 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 Last visit 1 year ago.  Last year she was diagnosed with vaginal cancer. She had TAH (no BSO) + ChTx and RxTx. She finished 10/2018.  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 She was started on Synthroid soon after.  She had a thyroid uptake and scan >> normal uptake but again demonstrated hot nodule on scan >> RAI treatment in 12/2009 (Dr. Chalmers Cater), after which she developed hypothyroidism >> started on levothyroxine, but currently on d.a.w. Synthroid.    She gave birth 36 years ago.  During her pregnancy she was on 175 mcg Synthroid.  We recently decreased the dose from 150 to 137 mcg daily.  Pt is on levothyroxine 137 mcg daily, taken: - in am - fasting - at least 30 min from b'fast - no Ca, Fe, MVI, PPIs - not on Biotin - + fish oil later in am -added recently  I reviewed patient's TFTs: Received labs from Dr. Ronita Hipps from 02/22/2019: -TSH 0.02, free T4 1.92, free T3 4.1 -HbA1c 5.2% -Lipids: 192/259/43/97 Lab Results  Component Value Date   TSH 0.80 03/24/2018   TSH 5.78 (H) 03/20/2017   TSH 5.27 01/16/2017   FREET4 0.99 03/24/2018   FREET4 0.91 03/20/2017  Received labs from [facility], drawn on 12/07/2017: TSH 0.85, free T3 3.0, free T4 1.29, all normal 08/28/2017: TSH: 1.28, Free T4: 1.31 07/08/2017: TSH 0.19, free T3 3.2, free T4 1.42 - on levothyroxine 175 mcg daily >> decreased to 6 out of 7 days  04/30/2017  TSH 1.4, free T4 1.48, free T3 3.1 02/19/2017. These were perfect: TSH 1.7, free T4 1.57 (0.82-1.77), free t3 3.4 (2-4.4).  12/10/2016: TSH 0.54 (0.4-4), free T3 3.3 (2-4.4), free T4 1.79 (0.82-1.77) - on LT4 175 mcg 11/11/2016: TSH 0.58 (0.4-4),  free T3 3.3 (2-4.4), free T4 1.79 (0.82-1.77) - on LT4 175 mcg 10/2016: TSH 14 - on LT4 137 mcg    Pt denies: - feeling nodules in neck - hoarseness - dysphagia - choking - SOB with lying down  She has + FH of thyroid disorders in: great aunt. + poss FH of thyroid cancer. No FH of thyroid cancer. No h/o radiation tx to head or neck.  No seaweed or kelp. No recent contrast studies. No herbal supplements. No Biotin use. No recent steroids use.  Pt. also has a history of cervical cancer >> she had conization and cerclage. She has a h/o absence seizures.  On Lamictal and Keppra. Doses adjusted after last visit.  She gave birth in 05/2017 (cesarean section).  ROS: Constitutional: no weight gain/no weight loss, no fatigue, + hot flushes, no subjective hypothermia Eyes: no blurry vision, no xerophthalmia ENT: no sore throat,+ see HPI Cardiovascular: no CP/no SOB/no palpitations/no leg swelling Respiratory: no cough/no SOB/no wheezing Gastrointestinal: no N/no V/no D/no C/no acid reflux Musculoskeletal: no muscle aches/no joint aches Skin: no rashes, + hair loss Neurological: no tremors/no numbness/no tingling/no dizziness  I reviewed pt's medications, allergies, PMH, social hx, family hx, and changes were documented in the history of present illness. Otherwise, unchanged from my initial visit note.  Past Medical History:  Diagnosis Date  . Cancer [facility]) DX 2011  CERVICAL   . Cough    WITH SEASONAL ALLEGIES, NONPRODUCTIVE LAST 2 DAYS  . H/O radioactive iodine thyroid ablation 12/2009  . 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 History of cervical dysplasia   . History of hyperthyroidism    per pt dx 2000 -- s/p RAI  2011  . History of kidney stones 2011  . Hx of varicella   . Hypothyroidism, postradioiodine therapy    endocrinology-  dr Cruzita Lederer  . Migraines   . Mild persistent asthma 01/19/2018   followed by dr Verlin Fester (allergy and asthma center) MILD  (SUBCLINICAL PER PT)  . 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 Vaginal Pap smear, abnormal    Past Surgical History:  Procedure Laterality Date  . ABDOMINAL CERCLAGE N/A 11/19/2016   Procedure: LAPAROSCOPIC TRANSABDOMINAL CERVICAL-ISTHMUSx2;  Surgeon: Governor Specking, MD;  Location: [place] [facility];  Service: Gynecology;  Laterality: N/A;  with ultrasound guidance. Start laparoscopically HOLD OPEN ABDOMINAL ITEMS   . CERCLAGE LAPAROSCOPIC ABDOMINAL N/A 11/19/2016   Procedure: CERCLAGE LAPAROSCOPIC ABDOMINAL;  Surgeon: Governor Specking, MD;  Location: [place] [facility];  Service: Gynecology;  Laterality: N/A;  . CERVICAL CONIZATION W/BX N/A 02/18/2018   Procedure: COLD KNIFE CONIZATION OF CERVIX WITH POSSIBLE BIOPSY;  Surgeon: [name], MD;  Location: [place];  Service: Gynecology;  Laterality: N/A;  . CESAREAN SECTION N/A 05/27/2017   Procedure: Primary CESAREAN SECTION;  Surgeon: Brien Few, MD;  Location: [place];  Service: Obstetrics;  Laterality: N/A;  EDD: 11/4/18Allergy: Adhesive, Relpax, Sulfa, Ultram  . COLD KNIFE CONIZATION  03-29-2010   [facility]  . DILATION AND CURETTAGE OF UTERUS  2009  . DILATION AND CURETTAGE OF UTERUS N/A 03/01/2013   Procedure: CERVICAL DILATATION AND ENDOCERVICAL CURRETAGE AND VAAGINAL BIOPIES;  Surgeon: Alvino Chapel, MD;  Location: [facility];  Service: Gynecology;  Laterality: N/A;  . IR IMAGING GUIDED PORT INSERTION  07/12/2018  . IR REMOVAL TUN ACCESS W/ PORT W/O FL MOD SED  01/14/2019  . LEEP  02/25/2010;  12/ 2012  dr Ronita Hipps office  . NASAL SEPTUM SURGERY  2006   Repair of deviated septum  . SIGMOIDOSCOPY  2009  . TONSILLECTOMY AND ADENOIDECTOMY  1995   Social History   Social History  . Marital status: Engaged    Spouse name: N/A  . Number of children: 0    Occupational History  . Triage Administrative Coordinator at [employer]  . Smoking status: Never Smoker  . Smokeless tobacco: Never Used  . Alcohol use Yes     Comment: Rare  . Drug use: No   Current Outpatient Medications on File Prior to Visit  Medication Sig Dispense Refill  . butalbital-acetaminophen-caffeine (FIORICET, ESGIC) 50-325-40 MG tablet Take 1-2 tablets by mouth every 4 (four) hours as needed for migraine.     Marland Kitchen ibuprofen (ADVIL,MOTRIN) 800 MG tablet Take 1 tablet (800 mg total) by mouth every 8 (eight) hours as needed. 30 tablet 0  . LamoTRIgine 100 MG TB24 24 hour tablet Take 1 tablet (100 mg total) by mouth at bedtime. 90 tablet 4  . LamoTRIgine 200 MG TB24 24 hour tablet Take 2 tablets (400 mg total) by mouth 2 (two) times daily. 360 tablet 4  . levETIRAcetam (KEPPRA)  750 MG tablet Take 2 tablets (1,500 mg total) by mouth 2 (two) times daily. 360 tablet 4  . lidocaine-prilocaine (EMLA) cream Apply to affected area once 30 g 3  . loratadine (CLARITIN) 10 MG tablet Take 10 mg by mouth every morning.     . montelukast (SINGULAIR) 10 MG tablet TAKE 1 TABLET BY MOUTH EVERYDAY AT BEDTIME (Patient not taking: Reported on 12/29/2018) 30 tablet 0  . Multiple Vitamins-Minerals (CENTRUM WOMEN) TABS Take 1 tablet by mouth daily.    Marland Kitchen SYNTHROID 137 MCG tablet Take 1 tablet (137 mcg total) by mouth daily before breakfast. 45 tablet 3   No current facility-administered medications on file prior to visit.    Allergies  Allergen Reactions  . Sulfa Antibiotics Swelling    Tongue swelling, throat irritated  . Adhesive [Tape] Other (See Comments)    "skin burn"  . Other Swelling and Other (See Comments)    Magic mouth wash/ causes swelling of the tongue  . Relpax [Eletriptan Hydrobromide] Other (See Comments)    Stroke-like symptoms  . Ultram [Tramadol] Other (See Comments)    2004--  Per pt had seizure due to interaction with other medication she  was taking at the time   Family History  Problem Relation Age of Onset  . Leukemia Other   . Diabetes Other   . Diabetes Mother   . Skin cancer Mother   . Hyperlipidemia Mother   . Hypertension Father   . Asthma Father   . Skin cancer Maternal Aunt   . Multiple myeloma Maternal Aunt   . Testicular cancer Maternal Uncle   . Hyperlipidemia Maternal Grandmother   . AAA (abdominal aortic aneurysm) Maternal Grandmother   . COPD Maternal Grandmother   . Prostate cancer Maternal Grandfather   . AAA (abdominal aortic aneurysm) Maternal Grandfather    PE: BP 120/70   Pulse (!) 105   Ht '5\' 6"'  (1.676 m) Comment: measured without shoes today  Wt 149 lb (67.6 kg)   LMP 05/17/2018 (Exact Date)   BMI 24.05 kg/m  Wt Readings from Last 3 Encounters:  04/14/19 149 lb (67.6 kg)  04/14/19 150 lb (68 kg)  12/29/18 148 lb (67.1 kg)   Constitutional: normal weight, in NAD Eyes: PERRLA, EOMI, no exophthalmos ENT: moist mucous membranes, no thyromegaly, no cervical lymphadenopathy Cardiovascular: RRR, No MRG Respiratory: CTA B Gastrointestinal: abdomen soft, NT, ND, BS+ Musculoskeletal: no deformities, strength intact in all 4 Skin: moist, warm, no rashes Neurological: no tremor with outstretched hands, DTR normal in all 4  ASSESSMENT: 1. Postablative Hypothyroidism  2. Hair loss  PLAN:  1. Patient with longstanding post ablative hypothyroidism, on Synthroid d.a.w., more uncontrolled recently.  -we  Reviewed together latest TFTs obtained by her OB/GYN doctor.  The TSH was suppressed so we decreased her Synthroid dose from 150 mcg to 137 mcg 02/2019.  She now continues on this dose.  Of note, during her most recent pregnancy, she was on 175 mcg daily. - latest thyroid labs reviewed with pt >> please see HPI - she continues on LT4 137 mcg daily - pt feels good on this dose. - we discussed about taking the thyroid hormone every day, with water, >30 minutes before breakfast, separated by >4  hours from acid reflux medications, calcium, iron, multivitamins. Pt. is taking it correctly. - will check thyroid tests today: TSH and fT4 - If labs are abnormal, she will need to return for repeat TFTs in 1.5 months  2. Hair loss -  She continues to have hair loss -We discussed about starting a B complex.  I explained that biotin would interfere with her TFTs and if she does start the B complex, she needs to hold the dose for at least 2 days prior to the next set of TFTs -I also advised her that this should start a B complex, to continue this for at least 6 months to be able to see an effect  Needs 3 mo Synthroid Rx.  Component     Latest Ref Rng & Units 04/14/2019  TSH     0.35 - 4.50 uIU/mL 0.15 (L)  T4,Free(Direct)     0.60 - 1.60 ng/dL 1.12  TSH improved, but still too low.  We will decrease the levothyroxine to 125 mcg daily and recheck her test in 1.5 months.  Philemon Kingdom, MD PhD [facility] Endocrinology

## 2019-04-14 NOTE — Patient Instructions (Signed)
Coronavirus (COVID-19) Are you at risk?  Are you at risk for the Coronavirus (COVID-19)?  To be considered HIGH RISK for Coronavirus (COVID-19), you have to meet the following criteria:  . Traveled to [place], [place], [place], [place] or [place]; or in the [place] to [place], [place], [place], or [place]; and have fever, cough, and shortness of breath within the last 2 weeks of travel OR . Been in close contact with a person diagnosed with COVID-19 within the last 2 weeks and have fever, cough, and shortness of breath . IF YOU DO NOT MEET THESE CRITERIA, YOU ARE CONSIDERED LOW RISK FOR COVID-19.  What to do if you are HIGH RISK for COVID-19?  . If you are having a medical emergency, call 911. . Seek medical care right away. Before you go to a doctor's office, urgent care or emergency department, call ahead and tell them about your recent travel, contact with someone diagnosed with COVID-19, and your symptoms. You should receive instructions from your physician's office regarding next steps of care.  . When you arrive at healthcare provider, tell the healthcare staff immediately you have returned from visiting [place], [place], [place], [place] or [place]; or traveled in the [place] to [place], [place], [place], or [place]; in the last two weeks or you have been in close contact with a person diagnosed with COVID-19 in the last 2 weeks.   . Tell the health care staff about your symptoms: fever, cough and shortness of breath. . After you have been seen by a medical provider, you will be either: o Tested for (COVID-19) and discharged home on quarantine except to seek medical care if symptoms worsen, and asked to  - Stay home and avoid contact with others until you get your results (4-5 days)  - Avoid travel on public transportation if possible (such as bus, train, or airplane) or o Sent to the Emergency Department by EMS for evaluation, COVID-19 testing, and possible  admission depending on your condition and test results.  What to do if you are LOW RISK for COVID-19?  Reduce your risk of any infection by using the same precautions used for avoiding the common cold or flu:  . Wash your hands often with soap and warm water for at least 20 seconds.  If soap and water are not readily available, use an alcohol-based hand sanitizer with at least 60% alcohol.  . If coughing or sneezing, cover your mouth and nose by coughing or sneezing into the elbow areas of your shirt or coat, into a tissue or into your sleeve (not your hands). . Avoid shaking hands with others and consider head nods or verbal greetings only. . Avoid touching your eyes, nose, or mouth with unwashed hands.  . Avoid close contact with people who are sick. . Avoid places or events with large numbers of people in one location, like concerts or sporting events. . Carefully consider travel plans you have or are making. . If you are planning any travel outside or inside the [place], visit the CDC's Travelers' Health webpage for the latest health notices. . If you have some symptoms but not all symptoms, continue to monitor at home and seek medical attention if your symptoms worsen. . If you are having a medical emergency, call 911.   ADDITIONAL HEALTHCARE OPTIONS FOR PATIENTS  [place] [facility] / e-Visit: [email or web address]         [facility]: [phone]  [facility]   [facility]: [phone]                   [facility]: [phone]   

## 2019-04-14 NOTE — Progress Notes (Signed)
Radiation Oncology         [phone] ________________________________  Name: Kelly Mcclure MRN: [id number]  Date: 04/14/2019  DOB: February 27, 1981  Follow-Up Visit Note  CC: Brien Few, MD  [name], MD    ICD-10-CM   1. Vaginal cancer (Kelly Mcclure)  C52     Diagnosis:   Stage III vaginal cancer(pT1b,cN1,M0invasive moderately differentiated squamous cell carcinoma)  Interval Since Last Radiation:  6 months  1. 07/14/18 - 08/19/18 / Pelvis; 25 fractions of 1.8 Gy for a total of 45 Gy 2. 08/20/18-08/31/18 / Pelvis (nodal boost) ; 8 fractions of 1.8 Gy for a total of 14.4 Gy (59.4 Gy cumulative to involved nodes) 3. 2/3, 2/12, 2/19, 09/27/18 / Vagina; 6 Gy in 4 fractions for a total dose of 24 Gy (brachytherapy treatment)  Narrative:  The patient returns today for routine follow-up. She last saw Dr Denman George on 12/29/2018. She also had her port removed on 01/14/2019.  Since her last visit, she underwent repeat PET scan on 12/28/2018. This showed: response to therapy of right pelvic nodal metastasis; no evidence of residual or recurrent hypermetabolic disease; similar bilateral pulmonary nodules, below PET resolution.   On review of systems, she is doing well overall. She denies pain, dysuria or hematuria, vaginal discharge or bleeding, rectal bleeding, diarrhea or constipation, abdominal bloating, and nausea or vomiting.                 ALLERGIES:  is allergic to sulfa antibiotics; adhesive [tape]; other; relpax [eletriptan hydrobromide]; and ultram [tramadol].  Meds: Current Outpatient Medications  Medication Sig Dispense Refill  . ALPRAZolam (XANAX) 0.5 MG tablet     . butalbital-acetaminophen-caffeine (FIORICET, ESGIC) 50-325-40 MG tablet Take 1-2 tablets by mouth every 4 (four) hours as needed for migraine.     . LamoTRIgine 100 MG TB24 24 hour tablet Take 1 tablet (100 mg total) by mouth at bedtime. 90 tablet 4  . LamoTRIgine 200 MG TB24 24 hour tablet Take 2 tablets (400 mg  total) by mouth 2 (two) times daily. 360 tablet 4  . levETIRAcetam (KEPPRA) 750 MG tablet Take 2 tablets (1,500 mg total) by mouth 2 (two) times daily. 360 tablet 4  . loratadine (CLARITIN) 10 MG tablet Take 10 mg by mouth every morning.     . Omega-3 Fatty Acids (FISH OIL) 1200 MG CAPS     . SYNTHROID 125 MCG tablet Take 1 tablet (125 mcg total) by mouth daily before breakfast. 90 tablet 3   No current facility-administered medications for this encounter.     Physical Findings: The patient is in no acute distress. Patient is alert and oriented.  height is 5\' 6"  (1.676 m) and weight is 150 lb (68 kg). Her temporal temperature is 98.3 F (36.8 C). Her blood pressure is 114/76 and her pulse is 99. Her respiration is 18 and oxygen saturation is 98%. .  No significant changes. Lungs are clear to auscultation bilaterally. Heart has regular rate and rhythm. No palpable cervical, supraclavicular, or axillary adenopathy. Abdomen soft, non-tender, normal bowel sounds. On pelvic examination the external genitalia were unremarkable. A speculum exam was performed. There are no mucosal lesions noted in the vaginal vault.  Exam would permit a small speculum.  The exam was uncomfortable for the patient. On bimanual and rectovaginal examination there were no pelvic masses appreciated.  Lab Findings: Lab Results  Component Value Date   WBC 3.9 (L) 01/14/2019   HGB 13.6 01/14/2019   HCT 42.9 01/14/2019  MCV 83.1 01/14/2019   PLT 198 01/14/2019    Radiographic Findings: No results found.  Impression: Clinically stable.  No evidence of recurrence on clinical exam today.  Encouraged patient to use her vaginal dilator on a more regular basis although this is difficult with her 36-year-old child at home  Plan:  She will follow-up with Dr. Denman George in 3 months, and follow-up in radiation oncology in 6 months.   ____________________________________   Blair Promise, PhD, MD   This document serves as a  record of services personally performed by Gery Pray, MD. It was created on his behalf by Wilburn Mylar, a trained medical scribe. The creation of this record is based on the scribe's personal observations and the provider's statements to them. This document has been checked and approved by the attending provider.

## 2019-04-14 NOTE — Progress Notes (Signed)
Pt presents today for f/u with Dr. Sondra Come. Pt denies c/o pain. Pt denies dysuria/hematuria. Pt denies vaginal bleeding/discharge. Pt denies rectal bleeding, diarrhea/constipation. Pt denies abdominal bloating, N/V.   BP 114/76 (BP Location: Left Arm, Patient Position: Sitting)   Pulse 99   Temp 98.3 F (36.8 C) (Temporal)   Resp 18   Ht 5\' 6"  (1.676 m)   Wt 150 lb (68 kg)   LMP 05/17/2018 (Exact Date)   SpO2 98%   BMI 24.21 kg/m   Wt Readings from Last 3 Encounters:  04/14/19 150 lb (68 kg)  12/29/18 148 lb (67.1 kg)  10/25/18 150 lb (68 kg)   Loma Sousa, RN BSN

## 2019-04-18 ENCOUNTER — Telehealth: Payer: Self-pay | Admitting: *Deleted

## 2019-04-18 NOTE — Telephone Encounter (Signed)
Called and left the patient a message that her appt for Dr. Denman George is scheduled and she will receive a message in my chart

## 2019-04-29 ENCOUNTER — Telehealth: Payer: Self-pay | Admitting: *Deleted

## 2019-04-29 NOTE — Telephone Encounter (Signed)
Returned the patient's call and moved appt to 12/10

## 2019-06-21 ENCOUNTER — Encounter: Payer: Self-pay | Admitting: Gynecologic Oncology

## 2019-06-22 ENCOUNTER — Other Ambulatory Visit: Payer: Self-pay | Admitting: Gynecologic Oncology

## 2019-06-22 ENCOUNTER — Telehealth: Payer: Self-pay | Admitting: *Deleted

## 2019-06-22 DIAGNOSIS — N951 Menopausal and female climacteric states: Secondary | ICD-10-CM

## 2019-06-22 NOTE — Telephone Encounter (Signed)
Called and scheduled the patient for a lab appt  

## 2019-06-27 ENCOUNTER — Inpatient Hospital Stay: Payer: BC Managed Care – PPO

## 2019-07-08 DIAGNOSIS — R232 Flushing: Secondary | ICD-10-CM | POA: Diagnosis not present

## 2019-07-13 ENCOUNTER — Other Ambulatory Visit: Payer: Self-pay | Admitting: Gynecologic Oncology

## 2019-07-13 ENCOUNTER — Encounter: Payer: Self-pay | Admitting: Gynecologic Oncology

## 2019-07-13 DIAGNOSIS — E2839 Other primary ovarian failure: Secondary | ICD-10-CM

## 2019-07-13 DIAGNOSIS — C52 Malignant neoplasm of vagina: Secondary | ICD-10-CM | POA: Diagnosis not present

## 2019-07-13 DIAGNOSIS — G40309 Generalized idiopathic epilepsy and epileptic syndromes, not intractable, without status epilepticus: Secondary | ICD-10-CM | POA: Diagnosis not present

## 2019-07-13 DIAGNOSIS — G43019 Migraine without aura, intractable, without status migrainosus: Secondary | ICD-10-CM | POA: Diagnosis not present

## 2019-07-13 MED ORDER — ESTRADIOL 0.1 MG/24HR TD PTTW: 1.0000 | MEDICATED_PATCH | TRANSDERMAL | 12 refills | Status: DC

## 2019-07-13 NOTE — Progress Notes (Signed)
See mychart message. Pt informed of [place] level and Dr. Serita Grit recommendations for estrogen patch therapy.

## 2019-07-14 ENCOUNTER — Inpatient Hospital Stay: Payer: BC Managed Care – PPO | Attending: Gynecologic Oncology | Admitting: Gynecologic Oncology

## 2019-07-14 ENCOUNTER — Encounter: Payer: Self-pay | Admitting: Gynecologic Oncology

## 2019-07-14 ENCOUNTER — Other Ambulatory Visit: Payer: Self-pay

## 2019-07-14 VITALS — BP 106/84 | HR 94 | Temp 97.8°F | Resp 16 | Ht 66.0 in | Wt 152.3 lb

## 2019-07-14 DIAGNOSIS — N895 Stricture and atresia of vagina: Secondary | ICD-10-CM | POA: Diagnosis not present

## 2019-07-14 DIAGNOSIS — Z9221 Personal history of antineoplastic chemotherapy: Secondary | ICD-10-CM | POA: Diagnosis not present

## 2019-07-14 DIAGNOSIS — Z79899 Other long term (current) drug therapy: Secondary | ICD-10-CM | POA: Insufficient documentation

## 2019-07-14 DIAGNOSIS — Z79890 Hormone replacement therapy (postmenopausal): Secondary | ICD-10-CM | POA: Insufficient documentation

## 2019-07-14 DIAGNOSIS — C52 Malignant neoplasm of vagina: Secondary | ICD-10-CM | POA: Diagnosis not present

## 2019-07-14 DIAGNOSIS — E28319 Asymptomatic premature menopause: Secondary | ICD-10-CM | POA: Diagnosis not present

## 2019-07-14 DIAGNOSIS — Z90710 Acquired absence of both cervix and uterus: Secondary | ICD-10-CM | POA: Diagnosis not present

## 2019-07-14 DIAGNOSIS — Z923 Personal history of irradiation: Secondary | ICD-10-CM | POA: Diagnosis not present

## 2019-07-14 DIAGNOSIS — Z90722 Acquired absence of ovaries, bilateral: Secondary | ICD-10-CM | POA: Diagnosis not present

## 2019-07-14 DIAGNOSIS — E039 Hypothyroidism, unspecified: Secondary | ICD-10-CM | POA: Diagnosis not present

## 2019-07-14 NOTE — Patient Instructions (Signed)
Please notify Dr Denman George at phone number [phone] if you notice vaginal bleeding, new pelvic or abdominal pains, bloating, feeling full easy, or a change in bladder or bowel function.   Dr Denman George recommends continuing use of the vaginal dilator.  Please consider use of an oil lubricant such coconut oil if this helps more than water-based lubricants.  Dr. Denman George strongly recommend using the estrogen patch as this will mitigate some of the effects of menopause on the vagina such as narrowing and drying.  Additionally this will help slow the progression of bone loss and aging of the heart and blood vessels that occur with menopause at an early age.  It is safe to take estrogen until average age of menopause which is approximately age 54.  This is not associated with an increased risk for breast cancer.

## 2019-07-14 NOTE — Progress Notes (Signed)
Follow-up Note: Gyn-Onc   Kelly Mcclure 38 y.o. female  Chief Complaint  Patient presents with  . 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 Positive deep margin (rectovaginal septum).  S/p adjuvant whole pelvic RT with vaginal brachytherapy and radiosensitizing CDDP completed February, 2020.  Complete clinical response on post-treatment PET and physical exam.  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   Vaginal narrowing and atrophy secondary to radiation and menopause.   Plan:  Recommend continued surveillance at 3 monthly intervals x 2 years. Recommend annual paps to evaluate for new HPV related disease/dysplasia.  Counseled regarding importance of dilator use to prevent vaginal narrowing/shortening to optimize sexual function and physical examinations.   Recommend vivelle dot estrogen replacement therapy (prescribed). If this is inadequate for vaginal symptoms, can add vaginal premarin or estrace.  Recommend repeat PET in May, 2021 to monitor the site of nodal involvement in the sidewall.  I will perform a Pap when I see her again in June, 2021.  HPI: Patient had an abnormal Pap smear in 2011. 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 Surgical margins were positive. Therefore, the patient underwent a cold knife conization on 04/01/2010. Pathology from that specimen showed severe dysplasia with negative margins. The patient was subsequently followed with Pap smears. A pap in March 2012 was ascus. ECC was negative. Repeat ECC in June 2012 was negative. Pap smear in 03/03/2012 showed high-grade SIL and again ECC was negative. Conservative followup was recommended.  Repeat colposcopy in December 2013 identified a lesion in the posterior vaginal fornix that was biopsied. Final pathology only showed atypia. A pap smear on that same day  only showed LSIL.  A Pap smear on 10/01/2012 showed high-grade SIL.  Pap smear in March was low grade SIL. Patient subsequently underwent cervical dilatation, endocervical curettage, and vaginal biopsy (03/01/2013). The only abnormality found was atypia on vaginal biopsy.  October 2014 Pap smear with low-grade SIL.  March 21, 2016 Pap smear showed HGSIL.  Endocervical curettage showed benign endocervical glands.  Pap smear July 15, 2016 showed HGSIL.  Endocervical curettage showed benign endocervical glands.  January 13, 2018 Pap smear showed HGSIL.  Exam by Kelly Mcclure on July 9th showed a friable exophytic lesion on the cervix which was biopsied. Final pathology showed "at least CIN III, invasion could not be ruled out". Colpo had been difficult as the cervix was flush with the vagina and the upper vagina was narrowed.  The patient was interested in a definitive hysterectomy procedure however she required an excisional procedure to rule out invasive carcinoma before having this.  On 02/18/18 she underwent cold knife conization of the cervix in the operating room.  Inspection of the cervix with acetic acid application was grossly unremarkable with no apparent lesions seen.  The upper vagina was also grossly normal.  The cervix was small, fairly flush with the surrounding vagina, however the conization procedure was relatively straightforward.  A 0.9 cm AP dimension by 1.5 cm deep dimension cone was taken without difficulty.  There is minimal bleeding in the operating room.  The patient began experiencing high fevers to 101 and 102 on postoperative day 4 and was prescribed empiric antibiotics. Exam revealed no concerning signs.  Final pathology revealed no residual dysplasia.  On postoperative examination a friable lesion was seen on the posterior upper vaginal wall measuring approximately 2cm. It was separate to,  but abutting, the posterior lip of the cervix. 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   The patient was recommended surgical excision of the lesion, and due to its proximity with the cervix, was recommended hysterectomy (without oophorectomy) at the time of surgery in order to establish adequate margins.  The patient had her surgery scheduled (robotic hysterectomy, upper vaginectomy), however canceled this after she discussed the proposed surgery with her referring doctor, Kelly Mcclure, who had concerns about the surgical plan. Kelly Mcclure requested that Kelly Mcclure see my partner, Kelly Mcclure for a second opinion.   On 04/27/18 Kelly Mcclure was evaluated by Kelly Mcclure who visualized the posterior wall lesion and repeated biopsies (superficial fragments of extensive squamous cell carcinoma in situ).  Given his shared concern that this lesion may harbor occult invasive disease, and due to its proximity to the cervix, he agreed with the surgical plan of hysterectomy (not oophorectomy) with salpingectomy and upper vaginectomy.   On June 01, 2018 the patient underwent a robotic assisted total hysterectomy with upper vaginectomy bilateral salpingectomy and due for pexy.  The surgical procedure was unremarkable and uncomplicated.  Operative findings were significant for a grossly normal uterus and cervix and tubes and ovaries.  A 3 x 3 cm discoid raised erythematous lesion was visualized vaginally on the posterior upper wall of the vagina with close proximity to the cervix (approximately 7 mm margin to the cervicovaginal junction.  At the completion of the procedure there was no visible remaining lesion in the vagina.  Via intraperitoneal inspection there was no eruption of the tumor through into the intraperitoneal space.  The rectovaginal septum was created with a ease and there was no gross involvement of the anterior rectal wall during surgery.  Inspection of the specimen revealed the margins to be grossly clear  of disease.  At the completion of the procedure the ovaries were pexed to the pelvic brim.  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  The carcinoma involved the cauterized deep resection margin representing the rectovaginal septal margin.  Lymphovascular space invasion was present.  The cervix and uterus and fallopian tubes are grossly unremarkable.  Postop PET showed a PET avid Rt pelvic lymph node consistent with metastatic disease (stage IIIC).   She was treated with radiosensitizing weekly CDDP with whole pelvic RT and vaginal brachytherapy (the pelvis received 25 fractions of 1.8 Gy for a total of 45 Gray, there was a pelvic nodal boost of 8 fractions of 1.8 Gray for a total of 14.4 Gy, cumulative dose to involve nodes of 59.4 Gy; the vagina received 6 Gy in 4 fractions for total dose of 24 Gray).  Treatment dates 07-14-18 to 09/27/18.   Repeat pet imaging 3 months post treatment was performed on Dec 28, 2018 and revealed response to therapy of the right pelvic nodal metastases with no increased uptake in the nodal basins and no lymphadenopathy.  There is no evidence of residual recurrent disease.  There was similar findings to the bilateral pulmonary nodules which appeared benign and stable from prior imaging.  She tolerated treatment well with no major toxicities.   Interval History:  The patient subsequently developed symptoms of early menopause with hot flashes and vaginal dryness and atrophy.  She has significant difficulty using the vaginal dilator due to vaginal narrowing and shortening.  FSH was drawn and was elevated  suggesting premature menopause secondary to radiation to the ovaries.  She was prescribed Vivelle-Dot estrogen replacement therapy.  Review of Systems:10 point review of systems is negative except as noted in interval history.   Vitals: Blood pressure 106/84, pulse 94,  temperature 97.8 F (36.6 C), temperature source Temporal, resp. rate 16, height '5\' 6"'  (1.676 m), weight 152 lb 4.8 oz (69.1 kg), last menstrual period 05/17/2018, SpO2 94 %.  Physical Exam: General : The patient is a healthy woman in no acute distress.  HEENT: normocephalic, extraoccular movements normal; neck is supple without thyromegally  Lynphnodes: Supraclavicular and inguinal nodes not enlarged  Abdomen: Soft, non-tender, no ascites, no organomegally, no masses, no hernias. Soft Pelvic:  Vaginal cuff with no lesions, upper vaginal narrowing. Discomfort with exam.    Lower extremities: No edema or varicosities. Normal range of motion   Allergies  Allergen Reactions  . Sulfa Antibiotics Swelling    Tongue swelling, throat irritated  . Adhesive [Tape] Other (See Comments)    "skin burn"  . Other Swelling and Other (See Comments)    Magic mouth wash/ causes swelling of the tongue  . Relpax [Eletriptan Hydrobromide] Other (See Comments)    Stroke-like symptoms  . Ultram [Tramadol] Other (See Comments)    2004--  Per pt had seizure due to interaction with other medication she was taking at the time    Past Medical History:  Diagnosis Date  . Cancer [facility]) DX 2011   CERVICAL   . Cough    WITH SEASONAL ALLEGIES, NONPRODUCTIVE LAST 2 DAYS  . H/O radioactive iodine thyroid ablation 12/2009  . 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 History of cervical dysplasia   . History of hyperthyroidism    per pt dx 2000 -- s/p RAI  2011  . History of kidney stones 2011  . Hx of varicella   . Hypothyroidism, postradioiodine therapy    endocrinology-  Kelly Cruzita Lederer  . Migraines   . Mild persistent asthma 01/19/2018   followed by Kelly Verlin Fester (allergy and asthma center) MILD (SUBCLINICAL PER PT)  . 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 Vaginal Pap smear, abnormal     Past Surgical History:  Procedure Laterality Date  . ABDOMINAL CERCLAGE N/A 11/19/2016   Procedure: LAPAROSCOPIC TRANSABDOMINAL CERVICAL-ISTHMUSx2;  Surgeon: Governor Specking, MD;  Location: [facility];  Service: Gynecology;  Laterality: N/A;  with ultrasound guidance. Start laparoscopically HOLD OPEN ABDOMINAL ITEMS   . CERCLAGE LAPAROSCOPIC ABDOMINAL N/A 11/19/2016   Procedure: CERCLAGE LAPAROSCOPIC ABDOMINAL;  Surgeon: Governor Specking, MD;  Location: [facility];  Service: Gynecology;  Laterality: N/A;  . CERVICAL CONIZATION W/BX N/A 02/18/2018   Procedure: COLD KNIFE CONIZATION OF CERVIX WITH POSSIBLE BIOPSY;  Surgeon: [name], MD;  Location: [place];  Service: Gynecology;  Laterality: N/A;  . CESAREAN SECTION N/A 05/27/2017   Procedure: Primary CESAREAN SECTION;  Surgeon: Brien Few, MD;  Location: [place];  Service: Obstetrics;  Laterality: N/A;  EDD: 11/4/18Allergy: Adhesive, Relpax, Sulfa, Ultram  . COLD KNIFE CONIZATION  03-29-2010   Kelly Mcclure  [facility]  . DILATION AND CURETTAGE OF UTERUS  2009  . DILATION AND CURETTAGE OF UTERUS N/A 03/01/2013   Procedure: CERVICAL DILATATION AND ENDOCERVICAL CURRETAGE AND VAAGINAL BIOPIES;  Surgeon: Alvino Chapel, MD;  Location:  [facility];  Service: Gynecology;  Laterality: N/A;  . IR IMAGING GUIDED PORT INSERTION  07/12/2018  . IR REMOVAL TUN ACCESS W/ PORT W/O FL MOD SED  01/14/2019  . LEEP  02/25/2010;  12/ 2012  Kelly Mcclure office  . NASAL SEPTUM SURGERY  2006   Repair of deviated septum  . SIGMOIDOSCOPY  2009  . TONSILLECTOMY AND ADENOIDECTOMY  1995    Current Outpatient Medications  Medication Sig Dispense Refill  . ALPRAZolam (XANAX) 0.5 MG tablet     . butalbital-acetaminophen-caffeine (FIORICET, ESGIC) 50-325-40 MG tablet Take 1-2 tablets by mouth every 4 (four) hours as needed for migraine.     Marland Kitchen estradiol (VIVELLE-DOT) 0.1 MG/24HR patch Place 1 patch (0.1 mg total)  onto the skin 2 (two) times a week. 8 patch 12  . LamoTRIgine 100 MG TB24 24 hour tablet Take 1 tablet (100 mg total) by mouth at bedtime. 90 tablet 4  . LamoTRIgine 200 MG TB24 24 hour tablet Take 2 tablets (400 mg total) by mouth 2 (two) times daily. 360 tablet 4  . levETIRAcetam (KEPPRA) 750 MG tablet Take 2 tablets (1,500 mg total) by mouth 2 (two) times daily. 360 tablet 4  . loratadine (CLARITIN) 10 MG tablet Take 10 mg by mouth every morning.     . Omega-3 Fatty Acids (FISH OIL) 1200 MG CAPS     . SYNTHROID 125 MCG tablet Take 1 tablet (125 mcg total) by mouth daily before breakfast. 90 tablet 3   No current facility-administered medications for this visit.    Social History   Socioeconomic History  . Marital status: Single    Spouse name: Not on file  . Number of children: 1  . Years of education: Not on file  . Highest education level: Not on file  Occupational History  . Occupation: Triage  Tobacco Use  . Smoking status: Never Smoker  . Smokeless tobacco: Never Used  Substance and Sexual Activity  . Alcohol use: Not Currently    Comment: SELDOM, NONE IN  10 MONTHS  . Drug use: No  . Sexual activity: Yes    Birth control/protection: None  Other Topics Concern  . Not on file  Social History Narrative  . Not on file   Social Determinants of Health   Financial Resource Strain:   . Difficulty of Paying Living Expenses: Not on file  Food Insecurity:   . Worried About Charity fundraiser in the Last Year: Not on file  . Ran Out of Food in the Last Year: Not on file  Transportation Needs:   . Lack of Transportation (Medical): Not on file  . Lack of Transportation (Non-Medical): Not on file  Physical Activity:   . Days of Exercise per Week: Not on file  . Minutes of Exercise per Session: Not on file  Stress:   . Feeling of Stress : Not on file  Social Connections:   . Frequency of Communication with Friends and Family: Not on file  . Frequency of Social Gatherings  with Friends and Family: Not on file  . Attends Religious Services: Not on file  . Active Member of Clubs or Organizations: Not on file  . Attends Archivist Meetings: Not on file  . Marital Status: Not on file  Intimate Partner Violence:   . Fear of Current or Ex-Partner: Not on file  . Emotionally Abused: Not on file  . Physically Abused: Not on file  . Sexually Abused: Not on file  Family History  Problem Relation Age of Onset  . Leukemia Other   . Diabetes Other   . Diabetes Mother   . Skin cancer Mother   . Hyperlipidemia Mother   . Hypertension Father   . Asthma Father   . Skin cancer Maternal Aunt   . Multiple myeloma Maternal Aunt   . Testicular cancer Maternal Uncle   . Hyperlipidemia Maternal Grandmother   . AAA (abdominal aortic aneurysm) Maternal Grandmother   . COPD Maternal Grandmother   . Prostate cancer Maternal Grandfather   . AAA (abdominal aortic aneurysm) Maternal Grandfather     Thereasa Solo, MD 07/14/2019, 4:05 PM

## 2019-07-15 ENCOUNTER — Ambulatory Visit: Payer: BC Managed Care – PPO | Admitting: Gynecologic Oncology

## 2019-08-03 DIAGNOSIS — M545 Low back pain: Secondary | ICD-10-CM | POA: Diagnosis not present

## 2019-08-04 DIAGNOSIS — N200 Calculus of kidney: Secondary | ICD-10-CM | POA: Diagnosis not present

## 2019-08-04 DIAGNOSIS — R109 Unspecified abdominal pain: Secondary | ICD-10-CM | POA: Diagnosis not present

## 2019-08-11 ENCOUNTER — Encounter: Payer: Self-pay | Admitting: Gynecologic Oncology

## 2019-10-03 ENCOUNTER — Encounter: Payer: Self-pay | Admitting: Radiation Oncology

## 2019-10-03 ENCOUNTER — Other Ambulatory Visit: Payer: Self-pay

## 2019-10-03 ENCOUNTER — Ambulatory Visit
Admission: RE | Admit: 2019-10-03 | Discharge: 2019-10-03 | Disposition: A | Discharge status: Home / Self Care | Payer: BC Managed Care – PPO | Source: Ambulatory Visit | Attending: Radiation Oncology | Admitting: Radiation Oncology

## 2019-10-03 VITALS — BP 118/78 | HR 95 | Temp 97.8°F | Resp 20 | Wt 158.2 lb

## 2019-10-03 DIAGNOSIS — Z79899 Other long term (current) drug therapy: Secondary | ICD-10-CM | POA: Insufficient documentation

## 2019-10-03 DIAGNOSIS — R140 Abdominal distension (gaseous): Secondary | ICD-10-CM | POA: Insufficient documentation

## 2019-10-03 DIAGNOSIS — Z923 Personal history of irradiation: Secondary | ICD-10-CM | POA: Diagnosis not present

## 2019-10-03 DIAGNOSIS — C775 Secondary and unspecified malignant neoplasm of intrapelvic lymph nodes: Secondary | ICD-10-CM

## 2019-10-03 DIAGNOSIS — Z8544 Personal history of malignant neoplasm of other female genital organs: Secondary | ICD-10-CM | POA: Diagnosis not present

## 2019-10-03 DIAGNOSIS — R110 Nausea: Secondary | ICD-10-CM | POA: Diagnosis not present

## 2019-10-03 DIAGNOSIS — Z8589 Personal history of malignant neoplasm of other organs and systems: Secondary | ICD-10-CM | POA: Insufficient documentation

## 2019-10-03 DIAGNOSIS — R232 Flushing: Secondary | ICD-10-CM | POA: Diagnosis not present

## 2019-10-03 DIAGNOSIS — Z08 Encounter for follow-up examination after completed treatment for malignant neoplasm: Secondary | ICD-10-CM | POA: Diagnosis not present

## 2019-10-03 DIAGNOSIS — C52 Malignant neoplasm of vagina: Secondary | ICD-10-CM

## 2019-10-03 DIAGNOSIS — Z8579 Personal history of other malignant neoplasms of lymphoid, hematopoietic and related tissues: Secondary | ICD-10-CM | POA: Diagnosis not present

## 2019-10-03 NOTE — Progress Notes (Signed)
Ms. Cisar presents today for f/u with Dr. Sondra Come. Pt denies c/o pain. Pt denies dysuria. Pt reports hematuria due to kidney stone. Pt is currently under surveillance by Dr. Noah Delaine urologist. Pt denies vaginal bleeding/discharge. Pt denies rectal bleeding, diarrhea/constipation. Pt reports recent abdominal bloating "all the time" and frequent nausea without vomiting.   BP 118/78 (BP Location: Left Arm, Patient Position: Sitting, Cuff Size: Normal)   Pulse 95   Temp 97.8 F (36.6 C)   Resp 20   Wt 158 lb 3.2 oz (71.8 kg)   LMP 05/17/2018 (Exact Date)   SpO2 96%   BMI 25.53 kg/m   Wt Readings from Last 3 Encounters:  10/03/19 158 lb 3.2 oz (71.8 kg)  07/14/19 152 lb 4.8 oz (69.1 kg)  04/14/19 150 lb (68 kg)   Loma Sousa, RN BSN

## 2019-10-03 NOTE — Progress Notes (Signed)
Radiation Oncology         [phone] ________________________________  Name: Kelly Mcclure MRN: [id number]  Date: 10/03/2019  DOB: [birth year]  Follow-Up Visit Note  CC: Brien Few, MD  [name], MD    ICD-10-CM   1. Vaginal cancer ([place])  C52 NM PET Image Restag (PS) Skull Base To Thigh  2. 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  1. 07/14/2018 - 08/19/2018 / Pelvis; 25 fractions of 1.8 Gy for a total of 45 Gy 2. 08/20/2018 - 08/31/2018 / Pelvis (nodal boost) ; 8 fractions of 1.8 Gy for a total of 14.4 Gy (59.4 Gy cumulative to involved nodes) 3. 09/06/2018, 09/15/2018, 09/22/2018, 09/27/2018 / Vagina; 6 Gy in 4 fractions for a total dose of 24 Gy (brachytherapy treatment)  Narrative:  The patient returns today for routine follow-up. She reports being very busy working full-time and taking care of her 85-year-old daughter.  As consequence she is not been consistent with using her vaginal dilator.  She did use her dilator last night and experienced a small amount of vaginal bleeding.  We discussed the importance of using this on a regular basis.  She was given a estradiol patch by Dr. Denman George in December however the patient has not started using this patch.  She reports having hot flashes consistent with menopause.  She followed up with Dr. Denman George on 07/14/2019, during which time they discussed continued surveillance for two years. Dr. Denman George also recommended annual PAP smears to evaluation for new HPV related disease/dysplasia as well as vivelle dot estrogen replacement therapy (prescribed). The patient was counseled on the importance of dilator use to prevent vaginal narrowing/shortening.  Of note, the patient was recently diagnosed with a kidney stone and is  being followed by Dr. Noah Delaine, urologist.  She reports having microscopic hematuria but no gross hematuria.  She does have occasional mild right flank pain but does not require any medication for this issue.  On review of systems, she reports hematuria secondary to kidney stone. She also reports abdominal bloating and frequent nausea. She denies pelvic pain, vaginal bleeding/discharge, rectal bleeding, diarrhea, constipation, and vomiting.  ALLERGIES:  is allergic to sulfa antibiotics; adhesive [tape]; other; relpax [eletriptan hydrobromide]; and ultram [tramadol].  Meds: Current Outpatient Medications  Medication Sig Dispense Refill  . ALPRAZolam (XANAX) 0.5 MG tablet     . butalbital-acetaminophen-caffeine (FIORICET, ESGIC) 50-325-40 MG tablet Take 1-2 tablets by mouth every 4 (four) hours as needed for migraine.     . cyclobenzaprine (FLEXERIL) 5 MG tablet Take 5 mg by mouth 3 (three) times daily as needed.    . LamoTRIgine 100 MG TB24 24 hour tablet Take 1 tablet (100 mg total) by mouth at bedtime. 90 tablet 4  . LamoTRIgine 200 MG TB24 24 hour tablet Take 2 tablets (400 mg total) by mouth 2 (two) times daily. 360 tablet 4  . levETIRAcetam (KEPPRA) 750 MG tablet Take 2 tablets (1,500 mg total) by mouth 2 (two) times daily. 360 tablet 4  . loratadine (CLARITIN) 10 MG tablet Take 10 mg by mouth every morning.     . Omega-3 Fatty Acids (FISH OIL) 1200 MG CAPS     . Rimegepant Sulfate (NURTEC) 75 MG TBDP Take by mouth.    . SYNTHROID 125 MCG tablet Take 1 tablet (125 mcg total) by  mouth daily before breakfast. 90 tablet 3  . estradiol (VIVELLE-DOT) 0.1 MG/24HR patch Place 1 patch (0.1 mg total) onto the skin 2 (two) times a week. (Patient not taking: Reported on 10/03/2019) 8 patch 12   No current facility-administered medications for this encounter.    Physical Findings: The patient is in no acute distress. Patient is alert and oriented.  weight is 158 lb 3.2 oz (71.8 kg). Her temperature  is 97.8 F (36.6 C). Her blood pressure is 118/78 and her pulse is 95. Her respiration is 20 and oxygen saturation is 96%. .   Lungs are clear to auscultation bilaterally. Heart has regular rate and rhythm. No palpable cervical, supraclavicular, or axillary adenopathy. Abdomen soft, non-tender, normal bowel sounds. On pelvic examination the external genitalia were unremarkable.  No palpable inguinal adenopathy. A speculum exam was performed. There are no mucosal lesions noted in the vaginal vault.  Exam would permit a small speculum and index finger. The exam was uncomfortable for the patient.  The vaginal vault was estimated to be approximately 5 cm in length. On exam she was noted to have some agglutination/ vaginal band in the mid and proximal vagina with exam.  These cannot be easily broken on exam. on bimanual and rectovaginal examination there were no pelvic masses appreciated.  Rectal sphincter tone normal  Lab Findings: Lab Results  Component Value Date   WBC 3.9 (L) 01/14/2019   HGB 13.6 01/14/2019   HCT 42.9 01/14/2019   MCV 83.1 01/14/2019   PLT 198 01/14/2019    Radiographic Findings: No results found.  Impression: Stage III vaginal cancer(pT1b,cN1,M0invasive moderately differentiated squamous cell carcinoma)   No evidence of recurrence on clinical exam today.  However findings of abdominal bloating and nausea are concerning which  I discussed with the patient.  We will attempt to move up patient's PET scan.  I again stressed the importance of using her vaginal dilator on a more regular basis and recommended she consider starting her estradiol patch as recommended by Dr. Denman George.  Plan: She will follow-up with Dr. Denman George on 01/11/2020, and follow-up in radiation oncology in 6 months. Repeat PAP will be performed during visit with Dr. Denman George and PET scan will be moved up in light of the patient's new symptoms.  ____________________________________   Blair Promise, PhD, MD   This document serves as a record of services personally performed by Gery Pray, MD. It was created on his behalf by Clerance Lav, a trained medical scribe. The creation of this record is based on the scribe's personal observations and the provider's statements to them. This document has been checked and approved by the attending provider.

## 2019-10-03 NOTE — Patient Instructions (Signed)
Coronavirus (COVID-19) Are you at risk?  Are you at risk for the Coronavirus (COVID-19)?  To be considered HIGH RISK for Coronavirus (COVID-19), you have to meet the following criteria:  . Traveled to [place], [place], [place], [place] or [place]; or in the [place] to [place], [place], [place], or [place]; and have fever, cough, and shortness of breath within the last 2 weeks of travel OR . Been in close contact with a person diagnosed with COVID-19 within the last 2 weeks and have fever, cough, and shortness of breath . IF YOU DO NOT MEET THESE CRITERIA, YOU ARE CONSIDERED LOW RISK FOR COVID-19.  What to do if you are HIGH RISK for COVID-19?  . If you are having a medical emergency, call 911. . Seek medical care right away. Before you go to a doctor's office, urgent care or emergency department, call ahead and tell them about your recent travel, contact with someone diagnosed with COVID-19, and your symptoms. You should receive instructions from your physician's office regarding next steps of care.  . When you arrive at healthcare provider, tell the healthcare staff immediately you have returned from visiting [place], [place], [place], [place] or [place]; or traveled in the [place] to [place], [place], [place], or [place]; in the last two weeks or you have been in close contact with a person diagnosed with COVID-19 in the last 2 weeks.   . Tell the health care staff about your symptoms: fever, cough and shortness of breath. . After you have been seen by a medical provider, you will be either: o Tested for (COVID-19) and discharged home on quarantine except to seek medical care if symptoms worsen, and asked to  - Stay home and avoid contact with others until you get your results (4-5 days)  - Avoid travel on public transportation if possible (such as bus, train, or airplane) or o Sent to the Emergency Department by EMS for evaluation, COVID-19 testing, and possible  admission depending on your condition and test results.  What to do if you are LOW RISK for COVID-19?  Reduce your risk of any infection by using the same precautions used for avoiding the common cold or flu:  . Wash your hands often with soap and warm water for at least 20 seconds.  If soap and water are not readily available, use an alcohol-based hand sanitizer with at least 60% alcohol.  . If coughing or sneezing, cover your mouth and nose by coughing or sneezing into the elbow areas of your shirt or coat, into a tissue or into your sleeve (not your hands). . Avoid shaking hands with others and consider head nods or verbal greetings only. . Avoid touching your eyes, nose, or mouth with unwashed hands.  . Avoid close contact with people who are sick. . Avoid places or events with large numbers of people in one location, like concerts or sporting events. . Carefully consider travel plans you have or are making. . If you are planning any travel outside or inside the [place], visit the CDC's Travelers' Health webpage for the latest health notices. . If you have some symptoms but not all symptoms, continue to monitor at home and seek medical attention if your symptoms worsen. . If you are having a medical emergency, call 911.   ADDITIONAL HEALTHCARE OPTIONS FOR PATIENTS  [facility] / e-Visit: [email or web address]         [facility]: [phone]  [facility]: [phone]                   [facility]: [phone]   

## 2019-10-12 ENCOUNTER — Telehealth: Payer: Self-pay | Admitting: *Deleted

## 2019-10-12 NOTE — Telephone Encounter (Signed)
Called patient to inform that insurance has denied payment for Pet, I informed her that the insurance girl Jodelle Green is working on getting this appealed, patient verified understanding this

## 2019-10-21 ENCOUNTER — Encounter: Payer: Self-pay | Admitting: Radiation Oncology

## 2019-10-24 ENCOUNTER — Telehealth: Payer: Self-pay | Admitting: *Deleted

## 2019-10-24 ENCOUNTER — Encounter: Payer: Self-pay | Admitting: Radiation Oncology

## 2019-10-24 NOTE — Telephone Encounter (Signed)
Called patient to inform that Pet hasn't been precerted yet by the insurance and as soon as it does, it will be scheduled, patient verified understanding this

## 2019-10-26 ENCOUNTER — Encounter: Payer: Self-pay | Admitting: Radiation Oncology

## 2019-10-26 ENCOUNTER — Ambulatory Visit (HOSPITAL_COMMUNITY): Payer: BC Managed Care – PPO

## 2019-10-27 ENCOUNTER — Ambulatory Visit: Payer: BC Managed Care – PPO | Admitting: Radiation Oncology

## 2019-11-02 ENCOUNTER — Encounter (HOSPITAL_COMMUNITY): Payer: BC Managed Care – PPO

## 2019-11-03 ENCOUNTER — Ambulatory Visit: Payer: BC Managed Care – PPO | Admitting: Radiation Oncology

## 2019-11-17 DIAGNOSIS — B029 Zoster without complications: Secondary | ICD-10-CM | POA: Insufficient documentation

## 2019-11-24 DIAGNOSIS — N200 Calculus of kidney: Secondary | ICD-10-CM | POA: Diagnosis not present

## 2019-11-29 ENCOUNTER — Telehealth: Payer: Self-pay | Admitting: *Deleted

## 2019-11-29 NOTE — Telephone Encounter (Signed)
RETURNED PATIENT'S PHONE CALL, SPOKE WITH PATIENT. ?

## 2019-11-30 ENCOUNTER — Telehealth: Payer: Self-pay | Admitting: Radiation Oncology

## 2019-11-30 ENCOUNTER — Other Ambulatory Visit: Payer: Self-pay | Admitting: Radiation Oncology

## 2019-11-30 DIAGNOSIS — C52 Malignant neoplasm of vagina: Secondary | ICD-10-CM

## 2019-11-30 DIAGNOSIS — R140 Abdominal distension (gaseous): Secondary | ICD-10-CM

## 2019-11-30 NOTE — Telephone Encounter (Signed)
Discussed imaging issues with nurse navigator in gynecologic oncology.  Unfortunately they have been unable to get patient's PET scan approved as Radiation Oncology. In light of this denial despite 3 attempts,  I will order a CT scan of the abdomen and pelvis.  The patient will see Dr. Denman George in June or sooner if necessary.

## 2019-12-01 ENCOUNTER — Encounter: Payer: Self-pay | Admitting: Gynecologic Oncology

## 2019-12-01 ENCOUNTER — Encounter: Payer: Self-pay | Admitting: Radiation Oncology

## 2019-12-01 ENCOUNTER — Telehealth: Payer: Self-pay | Admitting: *Deleted

## 2019-12-01 DIAGNOSIS — B029 Zoster without complications: Secondary | ICD-10-CM | POA: Diagnosis not present

## 2019-12-01 NOTE — Telephone Encounter (Signed)
CALLED PATIENT TO INFORM OF LABS ON 12-05-19 @ 3 PM AND PATIENT TO  PICK -UP PREP FOR CT ON 12-05-19 @ [facility], AND CT ON 12-06-19 - ARRIVAL TIME - 7:45 AM @ [facility], PT. TO BE NPO- 4 HRS. PRIOR TO TEST AND PATIENT TO SEE DR. KINARD FOR RESULTS ON 12-08-19 @ 9:15 AM, LVM FOR A RETURN CALL

## 2019-12-02 ENCOUNTER — Telehealth: Payer: Self-pay | Admitting: *Deleted

## 2019-12-02 NOTE — Telephone Encounter (Signed)
CALLED PATIENT TO INFORM THAT SHE CAN HAVE CT ON 12-06-19 PER RADIOLOGY, EVEN IF SHE HAS SHINGLES, LVM FOR A RETURN CALL

## 2019-12-05 ENCOUNTER — Ambulatory Visit
Admission: RE | Admit: 2019-12-05 | Discharge: 2019-12-05 | Disposition: A | Discharge status: Home / Self Care | Payer: BC Managed Care – PPO | Source: Ambulatory Visit | Attending: Radiation Oncology | Admitting: Radiation Oncology

## 2019-12-05 ENCOUNTER — Other Ambulatory Visit: Payer: Self-pay

## 2019-12-05 DIAGNOSIS — Z8589 Personal history of malignant neoplasm of other organs and systems: Secondary | ICD-10-CM | POA: Diagnosis not present

## 2019-12-05 DIAGNOSIS — C52 Malignant neoplasm of vagina: Secondary | ICD-10-CM

## 2019-12-05 LAB — CMP (CANCER CENTER ONLY)
ALT: 21 U/L (ref 0–44)
AST: 16 U/L (ref 15–41)
Albumin: 4.6 g/dL (ref 3.5–5.0)
Alkaline Phosphatase: 117 U/L (ref 38–126)
Anion gap: 11 (ref 5–15)
BUN: 9 mg/dL (ref 6–20)
CO2: 25 mmol/L (ref 22–32)
Calcium: 9.7 mg/dL (ref 8.9–10.3)
Chloride: 107 mmol/L (ref 98–111)
Creatinine: 0.76 mg/dL (ref 0.44–1.00)
GFR, Est AFR Am: >60 mL/min (ref >60)
GFR, Estimated: >60 mL/min (ref >60)
Glucose, Bld: 93 mg/dL (ref 70–99)
Potassium: 3.9 mmol/L (ref 3.5–5.1)
Sodium: 143 mmol/L (ref 135–145)
Total Bilirubin: 0.5 mg/dL (ref 0.3–1.2)
Total Protein: 7.8 g/dL (ref 6.5–8.1)

## 2019-12-05 LAB — CBC WITH DIFFERENTIAL (CANCER CENTER ONLY)
Abs Immature Granulocytes: 0.02 10*3/uL (ref 0.00–0.07)
Basophils Absolute: 0 10*3/uL (ref 0.0–0.1)
Basophils Relative: 1 %
Eosinophils Absolute: 0.2 10*3/uL (ref 0.0–0.5)
Eosinophils Relative: 3 %
HCT: 44.8 % (ref 36.0–46.0)
Hemoglobin: 14.2 g/dL (ref 12.0–15.0)
Immature Granulocytes: 0 %
Lymphocytes Relative: 40 %
Lymphs Abs: 2.1 10*3/uL (ref 0.7–4.0)
MCH: 26.2 pg (ref 26.0–34.0)
MCHC: 31.7 g/dL (ref 30.0–36.0)
MCV: 82.7 fL (ref 80.0–100.0)
Monocytes Absolute: 0.4 10*3/uL (ref 0.1–1.0)
Monocytes Relative: 8 %
Neutro Abs: 2.5 10*3/uL (ref 1.7–7.7)
Neutrophils Relative %: 48 %
Platelet Count: 221 10*3/uL (ref 150–400)
RBC: 5.42 MIL/uL — ABNORMAL HIGH (ref 3.87–5.11)
RDW: 13.4 % (ref 11.5–15.5)
WBC Count: 5.3 10*3/uL (ref 4.0–10.5)
nRBC: 0 % (ref 0.0–0.2)

## 2019-12-06 ENCOUNTER — Encounter (HOSPITAL_COMMUNITY): Payer: Self-pay

## 2019-12-06 ENCOUNTER — Ambulatory Visit (HOSPITAL_COMMUNITY)
Admission: RE | Admit: 2019-12-06 | Discharge: 2019-12-06 | Disposition: A | Discharge status: Home / Self Care | Payer: BC Managed Care – PPO | Source: Ambulatory Visit | Attending: Radiation Oncology | Admitting: Radiation Oncology

## 2019-12-06 DIAGNOSIS — R140 Abdominal distension (gaseous): Secondary | ICD-10-CM | POA: Diagnosis not present

## 2019-12-06 DIAGNOSIS — N200 Calculus of kidney: Secondary | ICD-10-CM | POA: Diagnosis not present

## 2019-12-06 MED ORDER — SODIUM CHLORIDE (PF) 0.9 % IJ SOLN
INTRAMUSCULAR | Status: AC
  Filled 2019-12-06: qty 50

## 2019-12-06 MED ORDER — IOHEXOL 300 MG/ML  SOLN
100.0000 mL | Freq: Once | INTRAMUSCULAR | Status: AC | PRN
  Administered 2019-12-06: 100 mL via INTRAVENOUS

## 2019-12-08 ENCOUNTER — Ambulatory Visit: Payer: Self-pay | Admitting: Radiation Oncology

## 2019-12-12 ENCOUNTER — Encounter (HOSPITAL_COMMUNITY): Payer: BC Managed Care – PPO

## 2019-12-14 DIAGNOSIS — G40309 Generalized idiopathic epilepsy and epileptic syndromes, not intractable, without status epilepticus: Secondary | ICD-10-CM | POA: Diagnosis not present

## 2019-12-21 ENCOUNTER — Ambulatory Visit: Payer: BC Managed Care – PPO | Admitting: Physician Assistant

## 2019-12-24 IMAGING — XA IR FLUORO GUIDE CV LINE*L*
1 series · 1 of 1 positions shown · non-contrast
Comparison: none

CLINICAL DATA: Vaginal carcinoma, needs durable venous access for
planned chemotherapy regimen.
TECHNIQUE: The procedure, risks, benefits, and alternatives were explained to
the patient. Questions regarding the procedure were encouraged and
answered. The patient understands and consents to the procedure. As
antibiotic prophylaxis, cefazolin 2 g was ordered pre-procedure and
administered intravenously within one hour of incision. Patency of
the right IJ vein was confirmed with ultrasound with image
documentation. An appropriate skin site was determined. Skin site
was marked. Region was prepped using maximum barrier technique
including cap and mask, sterile gown, sterile gloves, large sterile
sheet, and Chlorhexidine as cutaneous antisepsis. The region was
infiltrated locally with 1% lidocaine. Under real-time ultrasound
guidance, the right IJ vein was accessed with a 21 gauge
micropuncture needle; the needle tip within the vein was confirmed
with ultrasound image documentation. Needle was exchanged over a 018
guidewire for transitional dilator which allowed passage of the
Benson wire into the IVC. Over this, the transitional dilator was
exchanged for a 5 French MPA catheter. A small incision was made on
the right anterior chest wall and a subcutaneous pocket fashioned.
The power-injectable port was positioned and its catheter tunneled
to the right IJ dermatotomy site. The MPA catheter was exchanged
over an Amplatz wire for a peel-away sheath, through which the port
catheter, which had been trimmed to the appropriate length, was
advanced and positioned under fluoroscopy with its tip at the
cavoatrial junction. Spot chest radiograph confirms good catheter
position and no pneumothorax. The pocket was closed with deep
interrupted and subcuticular continuous 3-0 Monocryl sutures. The
port was flushed per protocol. The incisions were covered with
Dermabond then covered with a sterile dressing.

COMPLICATIONS:
COMPLICATIONS
None immediate

[Series 300: ir imaging guided port insertion · 1 of 1 slices shown]
[im 1/1]
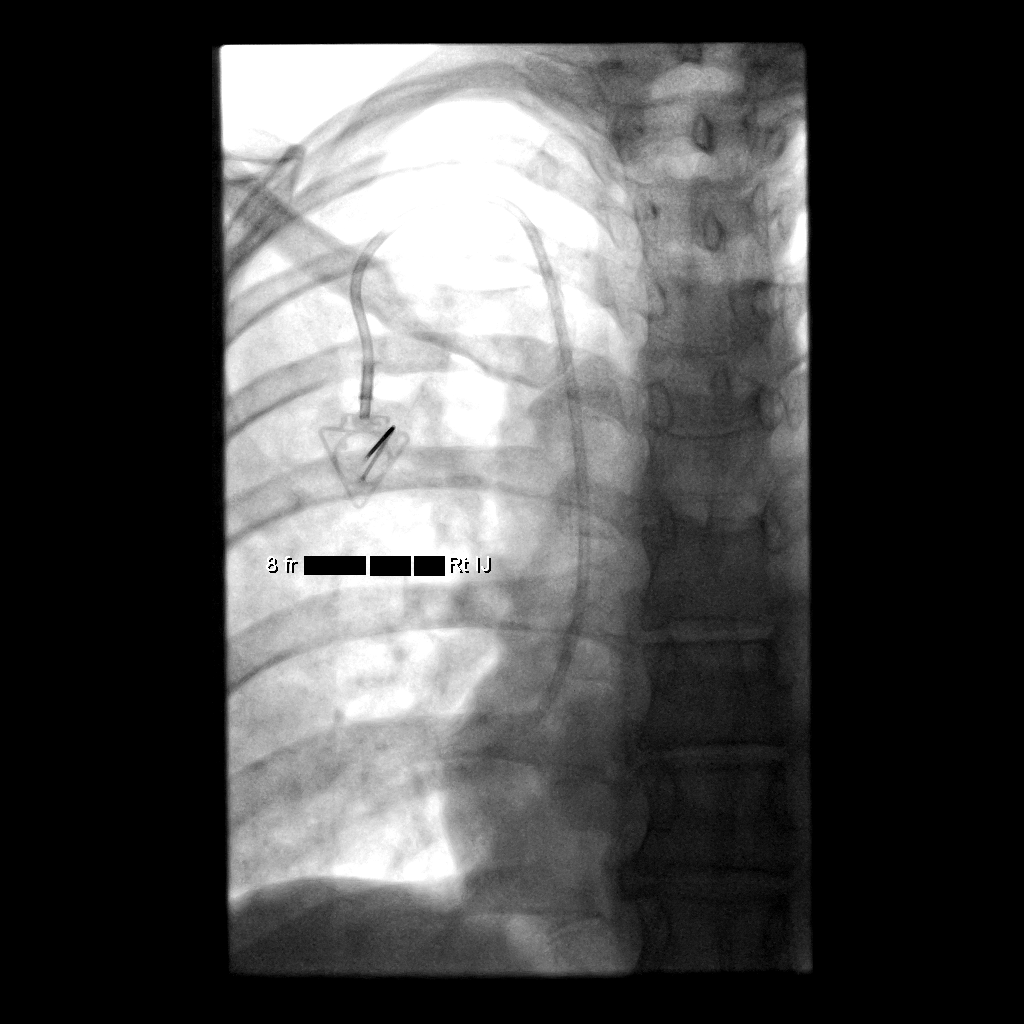

[1 of 1 positions shown; findings below may reference images not displayed]

EXAM:
TUNNELED PORT CATHETER PLACEMENT WITH ULTRASOUND AND FLUOROSCOPIC
GUIDANCE

FLUOROSCOPY TIME:  0.1 minute; 7 uLym2 DAP

ANESTHESIA/SEDATION:
Intravenous Fentanyl and Versed were administered as conscious
sedation during continuous monitoring of the patient's level of
consciousness and physiological / cardiorespiratory status by the
radiology RN, with a total moderate sedation time of 15 minutes.
IMPRESSION: Technically successful right IJ power-injectable port catheter
placement. Ready for routine use.

## 2020-01-11 ENCOUNTER — Other Ambulatory Visit: Payer: Self-pay

## 2020-01-11 ENCOUNTER — Inpatient Hospital Stay: Payer: BC Managed Care – PPO | Attending: Gynecologic Oncology | Admitting: Gynecologic Oncology

## 2020-01-11 ENCOUNTER — Encounter: Payer: Self-pay | Admitting: Gynecologic Oncology

## 2020-01-11 VITALS — BP 106/54 | HR 92 | Temp 98.3°F | Resp 17 | Ht 66.0 in | Wt 154.4 lb

## 2020-01-11 DIAGNOSIS — E2839 Other primary ovarian failure: Secondary | ICD-10-CM | POA: Diagnosis not present

## 2020-01-11 DIAGNOSIS — Z90722 Acquired absence of ovaries, bilateral: Secondary | ICD-10-CM | POA: Diagnosis not present

## 2020-01-11 DIAGNOSIS — Z9079 Acquired absence of other genital organ(s): Secondary | ICD-10-CM | POA: Insufficient documentation

## 2020-01-11 DIAGNOSIS — R918 Other nonspecific abnormal finding of lung field: Secondary | ICD-10-CM | POA: Insufficient documentation

## 2020-01-11 DIAGNOSIS — Z923 Personal history of irradiation: Secondary | ICD-10-CM | POA: Diagnosis not present

## 2020-01-11 DIAGNOSIS — Z90710 Acquired absence of both cervix and uterus: Secondary | ICD-10-CM | POA: Insufficient documentation

## 2020-01-11 DIAGNOSIS — Z79899 Other long term (current) drug therapy: Secondary | ICD-10-CM | POA: Diagnosis not present

## 2020-01-11 DIAGNOSIS — C52 Malignant neoplasm of vagina: Secondary | ICD-10-CM | POA: Diagnosis not present

## 2020-01-11 NOTE — Patient Instructions (Addendum)
Please notify Dr Denman George at phone number [phone] if you notice vaginal bleeding, new pelvic or abdominal pains, bloating, feeling full easy, or a change in bladder or bowel function.   Please return to see Dr Denman George in December.

## 2020-01-11 NOTE — Progress Notes (Signed)
Follow-up Note: Gyn-Onc   Kelly Mcclure 39 y.o. female  Chief Complaint  Patient presents with  . 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 Positive deep margin (rectovaginal septum).  S/p adjuvant whole pelvic RT with vaginal brachytherapy and radiosensitizing CDDP completed February, 2020.  Complete clinical response on post-treatment PET and physical exam.  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   Vaginal narrowing and atrophy secondary to radiation and menopause.   Plan:  Recommend continued surveillance at 3 monthly intervals x 2 years. Recommend annual paps to evaluate for new HPV related disease/dysplasia.  Counseled regarding importance of dilator use to prevent vaginal narrowing/shortening to optimize sexual function and physical examinations.   Recommend vivelle dot estrogen replacement therapy (prescribed). If this is inadequate for vaginal symptoms, can add vaginal premarin or estrace.  I will perform a Pap when I see her again in December, 2021.  HPI: Patient had an abnormal Pap smear in 2011. 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 Surgical margins were positive. Therefore, the patient underwent a cold knife conization on 04/01/2010. Pathology from that specimen showed severe dysplasia with negative margins. The patient was subsequently followed with Pap smears. A pap in March 2012 was ascus. ECC was negative. Repeat ECC in June 2012 was negative. Pap smear in 03/03/2012 showed high-grade SIL and again ECC was negative. Conservative followup was recommended.  Repeat colposcopy in December 2013 identified a lesion in the posterior vaginal fornix that was biopsied. Final pathology only showed atypia. A pap smear on that same day only showed LSIL.  A Pap smear on 10/01/2012 showed high-grade SIL.  Pap smear in March  was low grade SIL. Patient subsequently underwent cervical dilatation, endocervical curettage, and vaginal biopsy (03/01/2013). The only abnormality found was atypia on vaginal biopsy.  October 2014 Pap smear with low-grade SIL.  March 21, 2016 Pap smear showed HGSIL.  Endocervical curettage showed benign endocervical glands.  Pap smear July 15, 2016 showed HGSIL.  Endocervical curettage showed benign endocervical glands.  January 13, 2018 Pap smear showed HGSIL.  Exam by Dr Kelly Mcclure on July 9th showed a friable exophytic lesion on the cervix which was biopsied. Final pathology showed "at least CIN III, invasion could not be ruled out". Colpo had been difficult as the cervix was flush with the vagina and the upper vagina was narrowed.  The patient was interested in a definitive hysterectomy procedure however she required an excisional procedure to rule out invasive carcinoma before having this.  On 02/18/18 she underwent cold knife conization of the cervix in the operating room.  Inspection of the cervix with acetic acid application was grossly unremarkable with no apparent lesions seen.  The upper vagina was also grossly normal.  The cervix was small, fairly flush with the surrounding vagina, however the conization procedure was relatively straightforward.  A 0.9 cm AP dimension by 1.5 cm deep dimension cone was taken without difficulty.  There is minimal bleeding in the operating room.  The patient began experiencing high fevers to 101 and 102 on postoperative day 4 and was prescribed empiric antibiotics. Exam revealed no concerning signs.  Final pathology revealed no residual dysplasia.  On postoperative examination a friable lesion was seen on the posterior upper vaginal wall measuring approximately 2cm. It was separate to, but abutting, the posterior lip of the cervix. 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   The  patient was recommended surgical excision of the lesion, and due to its proximity with the cervix, was recommended hysterectomy (without oophorectomy) at the time of surgery in order to establish adequate margins.  The patient had her surgery scheduled (robotic hysterectomy, upper vaginectomy), however canceled this after she discussed the proposed surgery with her referring doctor, Dr Kelly Mcclure, who had concerns about the surgical plan. Dr Kelly Mcclure requested that Kelly Mcclure see my partner, Dr Kelly Mcclure for a second opinion.   On 04/27/18 Kelly Mcclure was evaluated by Dr Kelly Mcclure who visualized the posterior wall lesion and repeated biopsies (superficial fragments of extensive squamous cell carcinoma in situ).  Given his shared concern that this lesion may harbor occult invasive disease, and due to its proximity to the cervix, he agreed with the surgical plan of hysterectomy (not oophorectomy) with salpingectomy and upper vaginectomy.   On June 01, 2018 the patient underwent a robotic assisted total hysterectomy with upper vaginectomy bilateral salpingectomy and due for pexy.  The surgical procedure was unremarkable and uncomplicated.  Operative findings were significant for a grossly normal uterus and cervix and tubes and ovaries.  A 3 x 3 cm discoid raised erythematous lesion was visualized vaginally on the posterior upper wall of the vagina with close proximity to the cervix (approximately 7 mm margin to the cervicovaginal junction.  At the completion of the procedure there was no visible remaining lesion in the vagina.  Via intraperitoneal inspection there was no eruption of the tumor through into the intraperitoneal space.  The rectovaginal septum was created with a ease and there was no gross involvement of the anterior rectal wall during surgery.  Inspection of the specimen revealed the margins to be grossly clear of disease.  At the completion of the procedure the ovaries were pexed to the pelvic  brim.  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  The carcinoma involved the cauterized deep resection margin representing the rectovaginal septal margin.  Lymphovascular space invasion was present.  The cervix and uterus and fallopian tubes are grossly unremarkable.  Postop PET showed a PET avid Rt pelvic lymph node consistent with metastatic disease (stage IIIC).   She was treated with radiosensitizing weekly CDDP with whole pelvic RT and vaginal brachytherapy (the pelvis received 25 fractions of 1.8 Gy for a total of 45 Gray, there was a pelvic nodal boost of 8 fractions of 1.8 Gray for a total of 14.4 Gy, cumulative dose to involve nodes of 59.4 Gy; the vagina received 6 Gy in 4 fractions for total dose of 24 Gray).  Treatment dates 07-14-18 to 09/27/18.   Repeat pet imaging 3 months post treatment was performed on Dec 28, 2018 and revealed response to therapy of the right pelvic nodal metastases with no increased uptake in the nodal basins and no lymphadenopathy.  There is no evidence of residual recurrent disease.  There was similar findings to the bilateral pulmonary nodules which appeared benign and stable from prior imaging.  She tolerated treatment well with no major toxicities.   Interval History:  The patient subsequently developed symptoms of early menopause with hot flashes and vaginal dryness and atrophy.  She has significant difficulty using the vaginal dilator due to vaginal narrowing and shortening. FSH was drawn and was elevated suggesting premature menopause secondary to radiation to the ovaries.  She was prescribed Vivelle-Dot estrogen replacement therapy.  CT abd/pelvis in May, 2021 showed no evidence of recurrence and no lymphadenopathy.  She has no symptoms concerning for recurrence.   Review of Systems:10 point review of systems is negative except as noted in interval  history.   Vitals: Blood pressure (!) 106/54, pulse 92, temperature 98.3 F (36.8 C), temperature source Oral, resp. rate 17, height _0  (1.676 m), weight 154 lb 6.4 oz (70 kg), last menstrual period 05/17/2018, SpO2 98 %.  Physical Exam: General : The patient is a healthy woman in no acute distress.  HEENT: normocephalic, extraoccular movements normal; neck is supple without thyromegally  Lynphnodes: Supraclavicular and inguinal nodes not enlarged  Abdomen: Soft, non-tender, no ascites, no organomegally, no masses, no hernias. Soft Pelvic:  Vaginal cuff with no lesions, upper vaginal narrowing. Discomfort with exam.    Lower extremities: No edema or varicosities. Normal range of motion   Allergies  Allergen Reactions  . Sulfa Antibiotics Swelling    Tongue swelling, throat irritated  . Adhesive [Tape] Other (See Comments)    "skin burn"  . Other Swelling and Other (See Comments)    Magic mouth wash/ causes swelling of the tongue  . Relpax [Eletriptan Hydrobromide] Other (See Comments)    Stroke-like symptoms  . Ultram [Tramadol] Other (See Comments)    2004--  Per pt had seizure due to interaction with other medication she was taking at the time    Past Medical History:  Diagnosis Date  . Cancer [facility]) DX 2011   CERVICAL   . Cough    WITH SEASONAL ALLEGIES, NONPRODUCTIVE LAST 2 DAYS  . H/O radioactive iodine thyroid ablation 12/2009  . 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 History of cervical dysplasia   . History of hyperthyroidism    per pt dx 2000 -- s/p RAI  2011  . History of kidney stones 2011  . Hx of varicella   . Hypothyroidism, postradioiodine therapy    endocrinology-  dr Cruzita Lederer  . Migraines   . Mild persistent asthma 01/19/2018   followed by dr Verlin Fester ([facility] and [facility]) MILD (SUBCLINICAL PER PT)  . 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 Vaginal Pap smear, abnormal     Past Surgical History:  Procedure Laterality Date  . ABDOMINAL CERCLAGE N/A 11/19/2016   Procedure: LAPAROSCOPIC TRANSABDOMINAL CERVICAL-ISTHMUSx2;  Surgeon: Governor Specking, MD;  Location: [place] [facility];  Service: Gynecology;  Laterality: N/A;  with ultrasound guidance. Start laparoscopically HOLD OPEN ABDOMINAL ITEMS   . CERCLAGE LAPAROSCOPIC ABDOMINAL N/A 11/19/2016   Procedure: CERCLAGE LAPAROSCOPIC ABDOMINAL;  Surgeon: Governor Specking, MD;  Location: [place] [facility];  Service: Gynecology;  Laterality: N/A;  . CERVICAL CONIZATION W/BX N/A 02/18/2018   Procedure: COLD KNIFE CONIZATION OF CERVIX WITH POSSIBLE BIOPSY;  Surgeon: [name], MD;  Location: [place];  Service: Gynecology;  Laterality: N/A;  . CESAREAN SECTION N/A 05/27/2017   Procedure: Primary CESAREAN SECTION;  Surgeon: Brien Few, MD;  Location: [place];  Service: Obstetrics;  Laterality: N/A;  EDD: 11/4/18Allergy: Adhesive, Relpax, Sulfa, Ultram  . COLD KNIFE CONIZATION  03-29-2010   dr Kelly Mcclure  [facility]  . DILATION AND CURETTAGE OF UTERUS  2009  . DILATION AND CURETTAGE OF UTERUS N/A 03/01/2013   Procedure: CERVICAL DILATATION AND ENDOCERVICAL CURRETAGE AND VAAGINAL BIOPIES;  Surgeon:  Alvino Chapel, MD;  Location: [facility];  Service: Gynecology;  Laterality: N/A;  . IR IMAGING GUIDED PORT INSERTION  07/12/2018  . IR REMOVAL TUN ACCESS W/ PORT W/O FL MOD SED  01/14/2019  . LEEP  02/25/2010;  12/ 2012  dr Kelly Mcclure office  . NASAL SEPTUM SURGERY  2006   Repair of deviated septum  . SIGMOIDOSCOPY  2009  . TONSILLECTOMY AND ADENOIDECTOMY  1995    Current Outpatient Medications  Medication Sig Dispense Refill  . ALPRAZolam (XANAX) 0.5 MG tablet     . butalbital-acetaminophen-caffeine (FIORICET, ESGIC) 50-325-40 MG tablet Take 1-2 tablets by mouth every 4 (four) hours as needed for migraine.     Marland Kitchen estradiol  (VIVELLE-DOT) 0.1 MG/24HR patch Place 1 patch (0.1 mg total) onto the skin 2 (two) times a week. 8 patch 12  . FLUoxetine (PROZAC) 40 MG capsule Take 40 mg by mouth daily.    . LamoTRIgine 200 MG TB24 24 hour tablet Take 2 tablets (400 mg total) by mouth 2 (two) times daily. 360 tablet 4  . levETIRAcetam (KEPPRA) 750 MG tablet Take 2 tablets (1,500 mg total) by mouth 2 (two) times daily. 360 tablet 4  . loratadine (CLARITIN) 10 MG tablet Take 10 mg by mouth every morning.     . Rimegepant Sulfate (NURTEC) 75 MG TBDP Take by mouth.    . SYNTHROID 125 MCG tablet Take 1 tablet (125 mcg total) by mouth daily before breakfast. 90 tablet 3   No current facility-administered medications for this visit.    Social History   Socioeconomic History  . Marital status: Single    Spouse name: Not on file  . Number of children: 1  . Years of education: Not on file  . Highest education level: Not on file  Occupational History  . Occupation: Triage  Tobacco Use  . Smoking status: Never Smoker  . Smokeless tobacco: Never Used  Substance and Sexual Activity  . Alcohol use: Not Currently    Comment: SELDOM, NONE IN  10 MONTHS  . Drug use: No  . Sexual activity: Yes    Birth control/protection: None  Other Topics Concern  . Not on file  Social History Narrative  . Not on file   Social Determinants of Health   Financial Resource Strain:   . Difficulty of Paying Living Expenses:   Food Insecurity:   . Worried About Charity fundraiser in the Last Year:   . Arboriculturist in the Last Year:   Transportation Needs:   . Film/video editor (Medical):   Marland Kitchen Lack of Transportation (Non-Medical):   Physical Activity:   . Days of Exercise per Week:   . Minutes of Exercise per Session:   Stress:   . Feeling of Stress :   Social Connections:   . Frequency of Communication with Friends and Family:   . Frequency of Social Gatherings with Friends and Family:   . Attends Religious Services:   .  Active Member of Clubs or Organizations:   . Attends Archivist Meetings:   Marland Kitchen Marital Status:   Intimate Partner Violence:   . Fear of Current or Ex-Partner:   . Emotionally Abused:   Marland Kitchen Physically Abused:   . Sexually Abused:     Family History  Problem Relation Age of Onset  . Leukemia Other   . Diabetes Other   . Diabetes Mother   . Skin cancer Mother   . Hyperlipidemia Mother   .  Hypertension Father   . Asthma Father   . Skin cancer Maternal Aunt   . Multiple myeloma Maternal Aunt   . Testicular cancer Maternal Uncle   . Hyperlipidemia Maternal Grandmother   . AAA (abdominal aortic aneurysm) Maternal Grandmother   . COPD Maternal Grandmother   . Prostate cancer Maternal Grandfather   . AAA (abdominal aortic aneurysm) Maternal Grandfather     Thereasa Solo, MD 01/11/2020, 3:33 PM

## 2020-01-18 DIAGNOSIS — G43019 Migraine without aura, intractable, without status migrainosus: Secondary | ICD-10-CM | POA: Diagnosis not present

## 2020-01-18 DIAGNOSIS — G40309 Generalized idiopathic epilepsy and epileptic syndromes, not intractable, without status epilepticus: Secondary | ICD-10-CM | POA: Diagnosis not present

## 2020-03-23 DIAGNOSIS — R319 Hematuria, unspecified: Secondary | ICD-10-CM | POA: Diagnosis not present

## 2020-04-05 ENCOUNTER — Encounter: Payer: Self-pay | Admitting: Internal Medicine

## 2020-04-05 ENCOUNTER — Ambulatory Visit: Payer: BC Managed Care – PPO | Admitting: Internal Medicine

## 2020-04-05 ENCOUNTER — Ambulatory Visit
Admission: RE | Admit: 2020-04-05 | Discharge: 2020-04-05 | Disposition: A | Discharge status: Home / Self Care | Payer: BC Managed Care – PPO | Source: Ambulatory Visit | Attending: Radiation Oncology | Admitting: Radiation Oncology

## 2020-04-05 ENCOUNTER — Encounter: Payer: Self-pay | Admitting: Radiation Oncology

## 2020-04-05 ENCOUNTER — Other Ambulatory Visit: Payer: Self-pay

## 2020-04-05 VITALS — BP 110/80 | HR 95 | Temp 98.7°F | Resp 18 | Ht 66.0 in | Wt 153.8 lb

## 2020-04-05 VITALS — BP 120/70 | HR 94 | Ht 66.0 in | Wt 154.0 lb

## 2020-04-05 DIAGNOSIS — Z8544 Personal history of malignant neoplasm of other female genital organs: Secondary | ICD-10-CM | POA: Diagnosis not present

## 2020-04-05 DIAGNOSIS — Z8589 Personal history of malignant neoplasm of other organs and systems: Secondary | ICD-10-CM | POA: Insufficient documentation

## 2020-04-05 DIAGNOSIS — R140 Abdominal distension (gaseous): Secondary | ICD-10-CM | POA: Diagnosis not present

## 2020-04-05 DIAGNOSIS — Z79899 Other long term (current) drug therapy: Secondary | ICD-10-CM | POA: Diagnosis not present

## 2020-04-05 DIAGNOSIS — C52 Malignant neoplasm of vagina: Secondary | ICD-10-CM

## 2020-04-05 DIAGNOSIS — E890 Postprocedural hypothyroidism: Secondary | ICD-10-CM | POA: Diagnosis not present

## 2020-04-05 DIAGNOSIS — N200 Calculus of kidney: Secondary | ICD-10-CM | POA: Diagnosis not present

## 2020-04-05 DIAGNOSIS — Z923 Personal history of irradiation: Secondary | ICD-10-CM | POA: Diagnosis not present

## 2020-04-05 DIAGNOSIS — Z08 Encounter for follow-up examination after completed treatment for malignant neoplasm: Secondary | ICD-10-CM | POA: Diagnosis not present

## 2020-04-05 LAB — TSH: TSH: 1.14 u[IU]/mL (ref 0.35–4.50)

## 2020-04-05 LAB — T4, FREE: Free T4: 0.83 ng/dL (ref 0.60–1.60)

## 2020-04-05 NOTE — Progress Notes (Signed)
Radiation Oncology         [phone] ________________________________  Name: Kelly Mcclure MRN: [id number]  Date: 04/05/2020  DOB: 15-Jul-1981  Follow-Up Visit Note  CC: Brien Few, MD  [name], MD    ICD-10-CM   1. Vaginal cancer ([place])  C52     Diagnosis: Stage III vaginal cancer(pT1b,cN1,M0invasive moderately differentiated squamous cell carcinoma)  Interval Since Last Radiation: One year, six months, one week, and two days.  1. 07/14/2018 - 08/19/2018 / Pelvis; 25 fractions of 1.8 Gy for a total of 45 Gy 2. 08/20/2018 - 08/31/2018 / Pelvis (nodal boost) ; 8 fractions of 1.8 Gy for a total of 14.4 Gy (59.4 Gy cumulative to involved nodes) 3. 09/06/2018, 09/15/2018, 09/22/2018, 09/27/2018 / Vagina; 6 Gy in 4 fractions for a total dose of 24 Gy (brachytherapy treatment)  Narrative:  The patient returns today for routine follow-up. Unfortunately, the patient's insurance would not cover a PET scan. Thus, she underwent an abdominal and pelvic CT scan on 12/06/2019. There were no acute findings and no evidence of recurrent or metastatic carcinoma.  She reports occasional bloating but attributes this to known kidney stone on the right side.  Patient did have episode of either hematuria or vaginal bleeding 2 weeks ago.  In the detailed questioning she is unsure whether it came from the vaginal area or urine.  She was examined by the nurse practitioner at her office and there was no lesions noted within the vaginal vault per discussion with the patient.  Per discussion with patient her nurse practitioner did notice some adhesions in the vaginal vault.  She has not been using her vaginal dilator as recommended due to her busy work and lifestyle with a 37-year-old at home.  The patient was last seen by Dr. Denman George on 01/11/2020, during which time the patient was found to have a complete clinical response on examination. Of note, there was some premature ovarian failure in addition to  vaginal narrowing and atrophy secondary to radiation therapy and menopause. Vivelle dot estrogen replacement therapy was prescribed.  She unfortunately has not started her estrogen replacement therapy as recommended by Dr. Denman George.  Reports that her daughter likes to play with patches and has been hesitant in placing the estrogen patch for this issue.  Discussed with her that she may be a candidate for  vaginal estrogen if she would prefer.    ALLERGIES:  is allergic to sulfa antibiotics, adhesive [tape], other, relpax [eletriptan hydrobromide], and ultram [tramadol].  Meds: Current Outpatient Medications  Medication Sig Dispense Refill  . busPIRone (BUSPAR) 5 MG tablet Take 5 mg by mouth 2 (two) times daily.    . LamoTRIgine 200 MG TB24 24 hour tablet Take 2 tablets (400 mg total) by mouth 2 (two) times daily. 360 tablet 4  . levETIRAcetam (KEPPRA) 750 MG tablet Take 2 tablets (1,500 mg total) by mouth 2 (two) times daily. 360 tablet 4  . loratadine (CLARITIN) 10 MG tablet Take 10 mg by mouth every morning.     . Rimegepant Sulfate (NURTEC) 75 MG TBDP Take by mouth.    . SYNTHROID 125 MCG tablet Take 1 tablet (125 mcg total) by mouth daily before breakfast. 90 tablet 3  . ALPRAZolam (XANAX) 0.5 MG tablet  (Patient not taking: Reported on 04/05/2020)    . butalbital-acetaminophen-caffeine (FIORICET, ESGIC) 50-325-40 MG tablet Take 1-2 tablets by mouth every 4 (four) hours as needed for migraine.     Marland Kitchen estradiol (VIVELLE-DOT) 0.1 MG/24HR patch  Place 1 patch (0.1 mg total) onto the skin 2 (two) times a week. (Patient not taking: Reported on 04/05/2020) 8 patch 12  . FLUoxetine (PROZAC) 40 MG capsule Take 40 mg by mouth daily. (Patient not taking: Reported on 04/05/2020)     No current facility-administered medications for this encounter.    Physical Findings: The patient is in no acute distress. Patient is alert and oriented.  height is 5\' 6"  (1.676 m) and weight is 153 lb 12.8 oz (69.8 kg). Her  temperature is 98.7 F (37.1 C). Her blood pressure is 110/80 and her pulse is 95. Her respiration is 18 and oxygen saturation is 98%. .   Lungs are clear to auscultation bilaterally. Heart has regular rate and rhythm. No palpable cervical, supraclavicular, or axillary adenopathy. Abdomen soft, non-tender, normal bowel sounds.  No palpable inguinal adenopathy On pelvic examination the external genitalia were unremarkable.  The vaginal vault is significantly narrowed but would permit index finger.  Patient could not tolerate the small speculum so pediatric speculum was used.  The vaginal mucosa was noted to be thin and bled easily.  No mucosal lesions noted.  No palpable mass on digital and bimanual exam.  Some adhesions were noted in the upper vaginal vault.  Lab Findings: Lab Results  Component Value Date   WBC 5.3 12/05/2019   HGB 14.2 12/05/2019   HCT 44.8 12/05/2019   MCV 82.7 12/05/2019   PLT 221 12/05/2019    Radiographic Findings: No results found.  Impression: Stage III vaginal cancer(pT1b,cN1,M0invasive moderately differentiated squamous cell carcinoma)  No evidence of recurrence on clinical exam today. Recent CT of abdomen and pelvis did now show any evidence of recurrent or metastatic disease.  I strongly encouraged the patient to begin estrogen replacement as recommended and prescribed by Dr. Denman George.  We have also given her extra small dilator to use.  she has not been using her dilator as recommended and encouraged her to use to help prevent additional adhesions further vaginal narrowing and foreshortening.  Plan: The patient will follow-up with Dr. Denman George on 07/26/2020, during which time a PAP smear will be performed. She will follow-up with radiation oncology in six months.  Patient will call if she experiences vaginal bleeding or other concerning issues.  Total time spent in this encounter was 23 minutes which included reviewing the patient's most recent abdominal/pelvic CT  scan, follow-up with Dr. Denman George, physical examination, and documentation. ___________________________________   Blair Promise, PhD, MD  This document serves as a record of services personally performed by Gery Pray, MD. It was created on his behalf by Clerance Lav, a trained medical scribe. The creation of this record is based on the scribe's personal observations and the provider's statements to them. This document has been checked and approved by the attending provider.

## 2020-04-05 NOTE — Progress Notes (Addendum)
Patient here for a f/u visit with Dr. Sondra Come. Patient denies any pain. Reports brb vaginal bleeding 2 weeks ago. None since. Patient has a current kidney stone but currently has no pain. Patient denies bowel problems.  BP 110/80 (BP Location: Left Arm, Patient Position: Sitting, Cuff Size: Normal)   Pulse 95   Temp 98.7 F (37.1 C)   Resp 18   Ht 5\' 6"  (1.676 m)   Wt 153 lb 12.8 oz (69.8 kg)   LMP 05/17/2018 (Exact Date)   SpO2 98%   BMI 24.82 kg/m   Wt Readings from Last 3 Encounters:  04/05/20 153 lb 12.8 oz (69.8 kg)  01/11/20 154 lb 6.4 oz (70 kg)  10/03/19 158 lb 3.2 oz (71.8 kg)    Patient given an xs and xs+ dilators to use at home 3x aweek along with lubricant. Patient verbalizes understanding of use from previous visit.

## 2020-04-05 NOTE — Progress Notes (Signed)
Patient ID: Kelly Mcclure, female   DOB: [birth year], 39 y.o.   MRN: [id number]   This visit occurred during the SARS-CoV-2 public health emergency.  Safety protocols were in place, including screening questions prior to the visit, additional usage of staff PPE, and extensive cleaning of exam room while observing appropriate contact time as indicated for disinfecting solutions.   HPI  Kelly Mcclure is a 39 y.o.-year-old very pleasant female, initially referred by her ObGyn Dr., Dr. 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 Last visit 1 year ago.  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 She was started on Synthroid soon after.  She had a thyroid uptake and scan >> normal uptake but again demonstrated hot nodule on scan >> RAI treatment in 12/2009 (Dr. Chalmers Cater), after which she developed hypothyroidism >> started on levothyroxine, but currently on d.a.w. Synthroid.    She gave birth in 2018.  During her pregnancy she was on 175 mcg Synthroid.  Afterwards, we decreased the dose gradually.  Pt is on Synthroid 125 mcg daily (decreased 04/2019), taken: - in am - fasting - at least 30 min from b'fast - no Ca, Fe, MVI, PPIs - not on Biotin  Reviewed patient's TFTs: Lab Results  Component Value Date   TSH 0.15 (L) 04/14/2019   TSH 0.80 03/24/2018   TSH 5.78 (H) 03/20/2017   TSH 5.27 01/16/2017   FREET4 1.12 04/14/2019   FREET4 0.99 03/24/2018   FREET4 0.91 03/20/2017  Received labs from Dr. Ronita Hipps from 02/22/2019: -TSH 0.02, free T4 1.92, free T3 4.1 -HbA1c 5.2% -Lipids: 192/259/43/97 Received labs from [facility], drawn on 12/07/2017: TSH 0.85, free T3 3.0, free T4 1.29, all normal 08/28/2017: TSH: 1.28, Free T4: 1.31 07/08/2017: TSH 0.19, free T3 3.2, free T4 1.42 - on levothyroxine 175 mcg daily >> decreased to 6 out of 7 days  04/30/2017  TSH 1.4, free T4 1.48, free T3  3.1 02/19/2017. These were perfect: TSH 1.7, free T4 1.57 (0.82-1.77), free t3 3.4 (2-4.4).  12/10/2016: TSH 0.54 (0.4-4), free T3 3.3 (2-4.4), free T4 1.79 (0.82-1.77) - on LT4 175 mcg 11/11/2016: TSH 0.58 (0.4-4), free T3 3.3 (2-4.4), free T4 1.79 (0.82-1.77) - on LT4 175 mcg 10/2016: TSH 14 - on LT4 137 mcg    Pt denies: - feeling nodules in neck - hoarseness - dysphagia - choking - SOB with lying down  She has + FH of thyroid disorders in: great aunt. + poss FH of thyroid cancer. No FH of thyroid cancer. No h/o radiation tx to head or neck. No herbal supplements. No Biotin use. No recent steroids use.   She had hair loss at last visit and I recommended B complex.  She has a history of vaginal cancer diagnosed in 2019 >> she had conization and cerclage. She had TAH (no BSO) + ChTx and RxTx. She finished 10/2018.  She developed ovarian insufficiency afterwards.  She was recommended to start Vivelle dot >> she obtained it from the pharmacy and will start soon.  She has a history of absence seizures.  On Lamictal and Keppra.   She has HAs >> MUCH improved on qod Nurtec (preventative measure).  She gave birth in 05/2017 (cesarean section).  ROS: Constitutional: no weight gain/no weight loss, no fatigue, + subjective hyperthermia, no subjective hypothermia Eyes: no blurry vision, no xerophthalmia ENT: no sore throat, + see HPI Cardiovascular: no CP/no SOB/no  palpitations/no leg swelling Respiratory: no cough/no SOB/no wheezing Gastrointestinal: no N/no V/no D/no C/no acid reflux Musculoskeletal: no muscle aches/no joint aches Skin: no rashes, + hair loss Neurological: no tremors/no numbness/no tingling/no dizziness  I reviewed pt's medications, allergies, PMH, social hx, family hx, and changes were documented in the history of present illness. Otherwise, unchanged from my initial visit note.  Past Medical History:  Diagnosis Date  . Cancer [facility]) DX 2011   CERVICAL   . Cough     WITH SEASONAL ALLEGIES, NONPRODUCTIVE LAST 2 DAYS  . H/O radioactive iodine thyroid ablation 12/2009  . 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 History of cervical dysplasia   . History of hyperthyroidism    per pt dx 2000 -- s/p RAI  2011  . History of kidney stones 2011  . Hx of varicella   . Hypothyroidism, postradioiodine therapy    endocrinology-  dr Cruzita Lederer  . Migraines   . Mild persistent asthma 01/19/2018   followed by dr Verlin Fester ([facility]) MILD (SUBCLINICAL PER PT)  . 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 Vaginal Pap smear, abnormal    Past Surgical History:  Procedure Laterality Date  . ABDOMINAL CERCLAGE N/A 11/19/2016   Procedure: LAPAROSCOPIC TRANSABDOMINAL CERVICAL-ISTHMUSx2;  Surgeon: Governor Specking, MD;  Location: [facility];  Service: Gynecology;  Laterality: N/A;  with ultrasound guidance. Start laparoscopically HOLD OPEN ABDOMINAL ITEMS   . CERCLAGE LAPAROSCOPIC ABDOMINAL N/A 11/19/2016   Procedure: CERCLAGE LAPAROSCOPIC ABDOMINAL;  Surgeon: Governor Specking, MD;  Location: [facility];  Service: Gynecology;  Laterality: N/A;  . CERVICAL CONIZATION W/BX N/A 02/18/2018   Procedure: COLD KNIFE CONIZATION OF CERVIX WITH POSSIBLE BIOPSY;  Surgeon: [name], MD;  Location: [place];  Service: Gynecology;  Laterality: N/A;  . CESAREAN SECTION N/A 05/27/2017   Procedure: Primary CESAREAN SECTION;  Surgeon: Brien Few, MD;  Location: [place];  Service: Obstetrics;  Laterality: N/A;  EDD: 11/4/18Allergy: Adhesive, Relpax, Sulfa, Ultram  . COLD KNIFE CONIZATION  03-29-2010   dr Ronita Hipps  [facility]  . DILATION AND CURETTAGE OF UTERUS  2009  . DILATION AND CURETTAGE OF UTERUS N/A 03/01/2013   Procedure: CERVICAL DILATATION AND ENDOCERVICAL CURRETAGE AND VAAGINAL BIOPIES;   Surgeon: Alvino Chapel, MD;  Location: [facility];  Service: Gynecology;  Laterality: N/A;  . IR IMAGING GUIDED PORT INSERTION  07/12/2018  . IR REMOVAL TUN ACCESS W/ PORT W/O FL MOD SED  01/14/2019  . LEEP  02/25/2010;  12/ 2012  dr Ronita Hipps office  . NASAL SEPTUM SURGERY  2006   Repair of deviated septum  . SIGMOIDOSCOPY  2009  . TONSILLECTOMY AND ADENOIDECTOMY  1995   Social History   Social History  . Marital status: Engaged    Spouse name: N/A  . Number of children: 0   Occupational History  . 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 Smoking status: Never Smoker  . Smokeless tobacco: Never Used  . Alcohol use Yes     Comment: Rare  . Drug use: No   Current Outpatient Medications on File Prior to Visit  Medication Sig Dispense Refill  . ALPRAZolam (XANAX) 0.5 MG tablet  (Patient not taking: Reported on 04/05/2020)    . busPIRone (BUSPAR) 5  MG tablet Take 5 mg by mouth 2 (two) times daily.    . butalbital-acetaminophen-caffeine (FIORICET, ESGIC) 50-325-40 MG tablet Take 1-2 tablets by mouth every 4 (four) hours as needed for migraine.     Marland Kitchen estradiol (VIVELLE-DOT) 0.1 MG/24HR patch Place 1 patch (0.1 mg total) onto the skin 2 (two) times a week. (Patient not taking: Reported on 04/05/2020) 8 patch 12  . FLUoxetine (PROZAC) 40 MG capsule Take 40 mg by mouth daily. (Patient not taking: Reported on 04/05/2020)    . LamoTRIgine 200 MG TB24 24 hour tablet Take 2 tablets (400 mg total) by mouth 2 (two) times daily. 360 tablet 4  . levETIRAcetam (KEPPRA) 750 MG tablet Take 2 tablets (1,500 mg total) by mouth 2 (two) times daily. 360 tablet 4  . loratadine (CLARITIN) 10 MG tablet Take 10 mg by mouth every morning.     . Rimegepant Sulfate (NURTEC) 75 MG TBDP Take by mouth.    . SYNTHROID 125 MCG tablet Take 1 tablet (125 mcg total) by mouth daily before breakfast. 90 tablet 3   No current facility-administered medications on file prior to visit.    Allergies  Allergen Reactions  . Sulfa Antibiotics Swelling    Tongue swelling, throat irritated  . Adhesive [Tape] Other (See Comments)    "skin burn"  . Other Swelling and Other (See Comments)    Magic mouth wash/ causes swelling of the tongue  . Relpax [Eletriptan Hydrobromide] Other (See Comments)    Stroke-like symptoms  . Ultram [Tramadol] Other (See Comments)    2004--  Per pt had seizure due to interaction with other medication she was taking at the time   Family History  Problem Relation Age of Onset  . Leukemia Other   . Diabetes Other   . Diabetes Mother   . Skin cancer Mother   . Hyperlipidemia Mother   . Hypertension Father   . Asthma Father   . Skin cancer Maternal Aunt   . Multiple myeloma Maternal Aunt   . Testicular cancer Maternal Uncle   . Hyperlipidemia Maternal Grandmother   . AAA (abdominal aortic aneurysm) Maternal Grandmother   . COPD Maternal Grandmother   . Prostate cancer Maternal Grandfather   . AAA (abdominal aortic aneurysm) Maternal Grandfather   . Kidney failure Maternal Grandfather    PE: BP 120/70   Pulse 94   Ht '5\' 6"'  (1.676 m)   Wt 154 lb (69.9 kg)   LMP 05/17/2018 (Exact Date)   SpO2 97%   BMI 24.86 kg/m  Wt Readings from Last 3 Encounters:  04/05/20 154 lb (69.9 kg)  04/05/20 153 lb 12.8 oz (69.8 kg)  01/11/20 154 lb 6.4 oz (70 kg)   Constitutional: normal weight, in NAD Eyes: PERRLA, EOMI, no exophthalmos ENT: moist mucous membranes, no thyromegaly, no cervical lymphadenopathy Cardiovascular: RRR, No MRG Respiratory: CTA B Gastrointestinal: abdomen soft, NT, ND, BS+ Musculoskeletal: no deformities, strength intact in all 4 Skin: moist, warm, no rashes Neurological: no tremor with outstretched hands, DTR normal in all 4  ASSESSMENT: 1. Postablative Hypothyroidism  PLAN:  1. Patient with longstanding post ablative hypothyroidism, on Synthroid d.a.w. -we  Reviewed together latest TFTs obtained by her OB/GYN doctor.   The TSH was suppressed so we decreased her Synthroid dose from 150 mcg to 137 mcg 02/2019.  At last visit I advised her to decrease the dose even further to 125 mcg daily.  Of note, during her most recent pregnancy, she was on 175 mcg  daily. - latest thyroid labs reviewed with pt >> suppressed, after which I advised her to reduce the dose of levothyroxine but she did not return for labs afterwards: Lab Results  Component Value Date   TSH 0.15 (L) 04/14/2019   - she continues on LT4 125 mcg daily - pt feels good on this dose.  She still has hot flashes, most likely related to her POI for which she will start estrogen patch soon.  Also, she has hair loss, which is chronic. - we discussed about taking the thyroid hormone every day, with water, >30 minutes before breakfast, separated by >4 hours from acid reflux medications, calcium, iron, multivitamins. Pt. is taking it correctly. - will check thyroid tests today: TSH and fT4 - If labs are abnormal, she will need to return for repeat TFTs in 1.5 months  Needs refills.  Component     Latest Ref Rng & Units 04/05/2020  TSH     0.35 - 4.50 uIU/mL 1.14  T4,Free(Direct)     0.60 - 1.60 ng/dL 0.83   Normal TFTs.  Philemon Kingdom, MD PhD [facility]

## 2020-04-05 NOTE — Patient Instructions (Addendum)
Please stop at the lab.  Continue Synthroid 125 mcg daily.  Take the thyroid hormone every day, with water, at least 30 minutes before breakfast, separated by at least 4 hours from: - acid reflux medications - calcium - iron - multivitamins  Please come back for a follow-up appointment in 1 year.

## 2020-04-06 MED ORDER — SYNTHROID 125 MCG PO TABS: 125.0000 ug | ORAL_TABLET | Freq: Every day | ORAL | 3 refills | Status: DC

## 2020-04-12 DIAGNOSIS — M25562 Pain in left knee: Secondary | ICD-10-CM | POA: Diagnosis not present

## 2020-04-12 DIAGNOSIS — M25552 Pain in left hip: Secondary | ICD-10-CM | POA: Diagnosis not present

## 2020-04-13 ENCOUNTER — Ambulatory Visit: Payer: BC Managed Care – PPO | Admitting: Internal Medicine

## 2020-04-19 DIAGNOSIS — N200 Calculus of kidney: Secondary | ICD-10-CM | POA: Diagnosis not present

## 2020-05-07 ENCOUNTER — Other Ambulatory Visit (HOSPITAL_COMMUNITY)
Admission: RE | Admit: 2020-05-07 | Discharge: 2020-05-07 | Disposition: A | Discharge status: Home / Self Care | Payer: BC Managed Care – PPO | Source: Ambulatory Visit | Attending: Urology | Admitting: Urology

## 2020-05-07 DIAGNOSIS — Z01812 Encounter for preprocedural laboratory examination: Secondary | ICD-10-CM | POA: Diagnosis not present

## 2020-05-07 DIAGNOSIS — Z20822 Contact with and (suspected) exposure to covid-19: Secondary | ICD-10-CM | POA: Insufficient documentation

## 2020-05-07 LAB — SARS CORONAVIRUS 2 (TAT 6-24 HRS): SARS Coronavirus 2: NEGATIVE

## 2020-05-09 ENCOUNTER — Other Ambulatory Visit: Payer: Self-pay | Admitting: Urology

## 2020-05-09 NOTE — Progress Notes (Signed)
Patient called back. Instructions given . Clear liquids until 10 am Arrival time 1315. Mom is driver

## 2020-05-09 NOTE — Progress Notes (Signed)
Left message to call [facility]

## 2020-05-10 ENCOUNTER — Encounter (HOSPITAL_BASED_OUTPATIENT_CLINIC_OR_DEPARTMENT_OTHER)
Admission: RE | Disposition: A | Discharge status: Home / Self Care | Payer: Self-pay | Source: Home / Self Care | Attending: Urology

## 2020-05-10 ENCOUNTER — Encounter (HOSPITAL_BASED_OUTPATIENT_CLINIC_OR_DEPARTMENT_OTHER): Payer: Self-pay | Admitting: Urology

## 2020-05-10 ENCOUNTER — Other Ambulatory Visit: Payer: Self-pay

## 2020-05-10 ENCOUNTER — Ambulatory Visit (HOSPITAL_COMMUNITY): Payer: BC Managed Care – PPO

## 2020-05-10 ENCOUNTER — Ambulatory Visit (HOSPITAL_BASED_OUTPATIENT_CLINIC_OR_DEPARTMENT_OTHER)
Admission: RE | Admit: 2020-05-10 | Discharge: 2020-05-10 | Disposition: A | Discharge status: Home / Self Care | Payer: BC Managed Care – PPO | Attending: Urology | Admitting: Urology

## 2020-05-10 DIAGNOSIS — Z8544 Personal history of malignant neoplasm of other female genital organs: Secondary | ICD-10-CM | POA: Diagnosis not present

## 2020-05-10 DIAGNOSIS — N200 Calculus of kidney: Secondary | ICD-10-CM | POA: Diagnosis not present

## 2020-05-10 DIAGNOSIS — Z882 Allergy status to sulfonamides status: Secondary | ICD-10-CM | POA: Insufficient documentation

## 2020-05-10 DIAGNOSIS — Z888 Allergy status to other drugs, medicaments and biological substances status: Secondary | ICD-10-CM | POA: Insufficient documentation

## 2020-05-10 DIAGNOSIS — Z79890 Hormone replacement therapy (postmenopausal): Secondary | ICD-10-CM | POA: Diagnosis not present

## 2020-05-10 DIAGNOSIS — Z8541 Personal history of malignant neoplasm of cervix uteri: Secondary | ICD-10-CM | POA: Insufficient documentation

## 2020-05-10 DIAGNOSIS — Z885 Allergy status to narcotic agent status: Secondary | ICD-10-CM | POA: Insufficient documentation

## 2020-05-10 DIAGNOSIS — Z79899 Other long term (current) drug therapy: Secondary | ICD-10-CM | POA: Insufficient documentation

## 2020-05-10 DIAGNOSIS — N2889 Other specified disorders of kidney and ureter: Secondary | ICD-10-CM | POA: Diagnosis not present

## 2020-05-10 DIAGNOSIS — Z01818 Encounter for other preprocedural examination: Secondary | ICD-10-CM | POA: Diagnosis not present

## 2020-05-10 HISTORY — PX: EXTRACORPOREAL SHOCK WAVE LITHOTRIPSY: SHX1557

## 2020-05-10 SURGERY — LITHOTRIPSY, ESWL
Anesthesia: LOCAL | Laterality: Right

## 2020-05-10 MED ORDER — DIPHENHYDRAMINE HCL 25 MG PO CAPS
ORAL_CAPSULE | ORAL | Status: AC
  Filled 2020-05-10: qty 1

## 2020-05-10 MED ORDER — ALFUZOSIN HCL ER 10 MG PO TB24: 10.0000 mg | ORAL_TABLET | Freq: Every day | ORAL | 0 refills | Status: DC

## 2020-05-10 MED ORDER — DIAZEPAM 5 MG PO TABS
10.0000 mg | ORAL_TABLET | ORAL | Status: AC
  Administered 2020-05-10: 10 mg via ORAL

## 2020-05-10 MED ORDER — HYDROCODONE-ACETAMINOPHEN 5-325 MG PO TABS
1.0000 | ORAL_TABLET | ORAL | 0 refills | Status: DC | PRN
Start: 2020-05-10 — End: 2020-07-26

## 2020-05-10 MED ORDER — CIPROFLOXACIN HCL 500 MG PO TABS
500.0000 mg | ORAL_TABLET | ORAL | Status: AC
  Administered 2020-05-10: 500 mg via ORAL

## 2020-05-10 MED ORDER — DIAZEPAM 5 MG PO TABS
ORAL_TABLET | ORAL | Status: AC
  Filled 2020-05-10: qty 2

## 2020-05-10 MED ORDER — SODIUM CHLORIDE 0.9 % IV SOLN: INTRAVENOUS | Status: DC

## 2020-05-10 MED ORDER — ONDANSETRON HCL 4 MG PO TABS: 4.0000 mg | ORAL_TABLET | Freq: Every day | ORAL | 0 refills | Status: DC | PRN

## 2020-05-10 MED ORDER — DIPHENHYDRAMINE HCL 25 MG PO CAPS
25.0000 mg | ORAL_CAPSULE | ORAL | Status: AC
  Administered 2020-05-10: 25 mg via ORAL

## 2020-05-10 MED ORDER — CIPROFLOXACIN HCL 500 MG PO TABS
ORAL_TABLET | ORAL | Status: AC
  Filled 2020-05-10: qty 1

## 2020-05-10 NOTE — Discharge Instructions (Signed)
See [facility] discharge instructions in chart.  

## 2020-05-10 NOTE — H&P (Signed)
Please see noted scanned into chart

## 2020-05-10 NOTE — Op Note (Signed)
See [facility] OP note scanned into chart. Also because of the size, density, location and other factors that cannot be anticipated I feel this will likely be a staged procedure. This fact supersedes any indication in the scanned [facility] operative note to the contrary.  

## 2020-05-11 ENCOUNTER — Encounter (HOSPITAL_BASED_OUTPATIENT_CLINIC_OR_DEPARTMENT_OTHER): Payer: Self-pay | Admitting: Urology

## 2020-05-15 DIAGNOSIS — M25562 Pain in left knee: Secondary | ICD-10-CM | POA: Diagnosis not present

## 2020-05-24 DIAGNOSIS — N200 Calculus of kidney: Secondary | ICD-10-CM | POA: Diagnosis not present

## 2020-06-10 IMAGING — CT NUCLEAR MEDICINE PET IMAGE RESTAGING (PS) SKULL BASE TO THIGH
1 of 8 series · 3 of 16 positions shown, 4 images · non-contrast
Comparison: PET 07/02/2018. Diagnostic CTs of the chest, abdomen,
and pelvis of 06/22/2018.

CLINICAL DATA: Subsequent treatment strategy for restaging of
vaginal cancer. Carcinoma in situ.

EXAM:
NUCLEAR MEDICINE PET SKULL BASE TO THIGH
TECHNIQUE: 7.5 mCi F-18 FDG was injected intravenously. Full-ring PET imaging
was performed from the skull base to thigh after the radiotracer. CT
data was obtained and used for attenuation correction and anatomic
localization.
Fasting blood glucose: 91 mg/dl

[Series 4: ct sk_thigh 5.0 b31f · axial · 0.98mm/px · z∈[-1004,-124]mm · 3 of 221 slices shown, 4 images]
[im 1/221  soft-tissue]
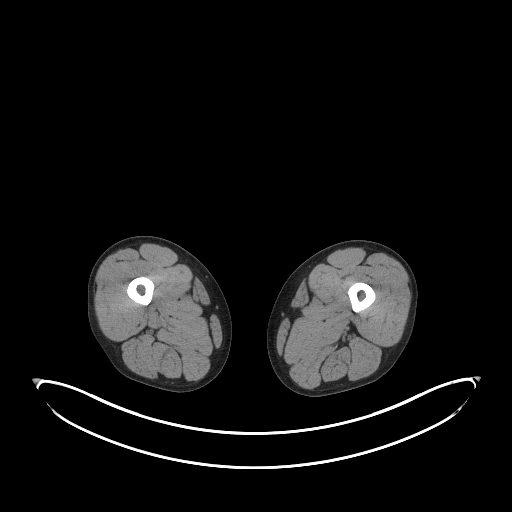
[im 1/221  bone]
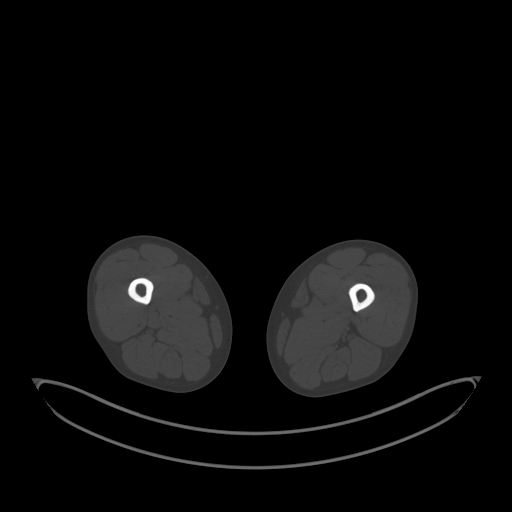
[im 111/221  soft-tissue]
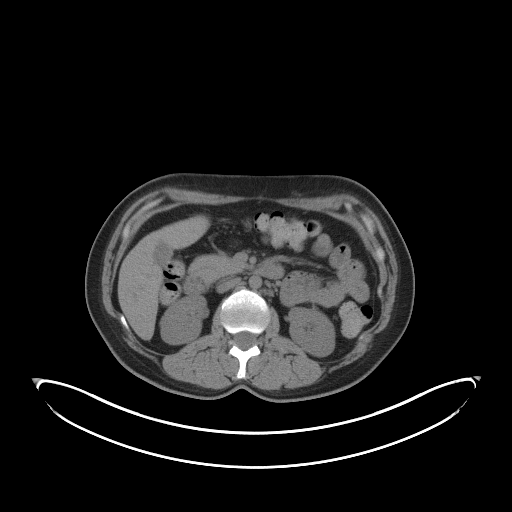
[im 221/221  soft-tissue]
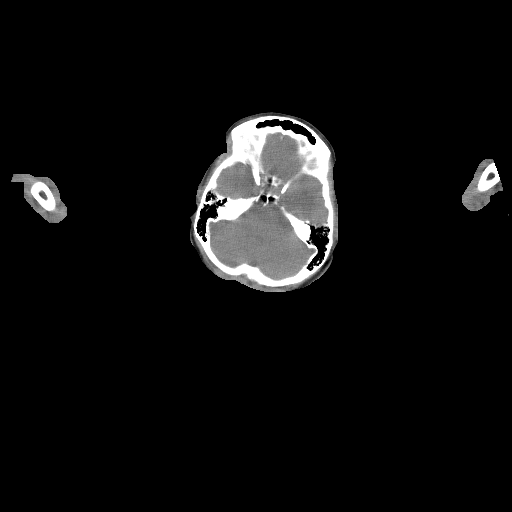

[3 of 16 positions shown; findings below may reference images not displayed]

FINDINGS: Mediastinal blood pool activity: SUV max

Liver activity: SUV max NA

NECK: No cervical nodal hypermetabolism. Bilateral symmetric
nasopharyngeal hypermetabolism is likely reactive. The previously
described hypermetabolic cervical nodes have resolved.

Incidental CT findings: No cervical adenopathy. Right maxillary
sinus mucous retention cyst or polyp.9 mm calcified right thyroid
nodule is unchanged.

CHEST: No pulmonary parenchymal or thoracic nodal hypermetabolism.

Incidental CT findings: Right Port-A-Cath tip at high right atrium.
A left upper lobe 5 mm pulmonary nodule on image [DATE] is similar and
below PET resolution.

A 4 mm right lower lobe pulmonary nodule on image 42/8 is also
unchanged and below PET resolution.

ABDOMEN/PELVIS: The previously described right pelvic hypermetabolic
lymph nodes have resolved. No vaginal cuff hypermetabolism.

Incidental CT findings: Upper pole right renal collecting system
calculi of up to 3 mm. Normal adrenal glands.

SKELETON: No suspicious focal osseous hypermetabolism. There is
relative photopenia at L5 which could be radiation induced.

Incidental CT findings: Lower thoracic vertebral hemangiomas.
IMPRESSION: 1. Response to therapy of right pelvic nodal metastasis.
2. No evidence of residual or recurrent hypermetabolic disease.
3. Similar bilateral pulmonary nodules, below PET resolution.

## 2020-06-12 DIAGNOSIS — N200 Calculus of kidney: Secondary | ICD-10-CM | POA: Diagnosis not present

## 2020-06-18 DIAGNOSIS — Z03818 Encounter for observation for suspected exposure to other biological agents ruled out: Secondary | ICD-10-CM | POA: Diagnosis not present

## 2020-06-18 DIAGNOSIS — Z20822 Contact with and (suspected) exposure to covid-19: Secondary | ICD-10-CM | POA: Diagnosis not present

## 2020-06-24 DIAGNOSIS — Z03818 Encounter for observation for suspected exposure to other biological agents ruled out: Secondary | ICD-10-CM | POA: Diagnosis not present

## 2020-06-24 DIAGNOSIS — R0981 Nasal congestion: Secondary | ICD-10-CM | POA: Diagnosis not present

## 2020-06-24 DIAGNOSIS — R059 Cough, unspecified: Secondary | ICD-10-CM | POA: Diagnosis not present

## 2020-06-24 DIAGNOSIS — B349 Viral infection, unspecified: Secondary | ICD-10-CM | POA: Diagnosis not present

## 2020-07-22 DIAGNOSIS — Z20822 Contact with and (suspected) exposure to covid-19: Secondary | ICD-10-CM | POA: Diagnosis not present

## 2020-07-22 DIAGNOSIS — Z03818 Encounter for observation for suspected exposure to other biological agents ruled out: Secondary | ICD-10-CM | POA: Diagnosis not present

## 2020-07-26 ENCOUNTER — Encounter: Payer: Self-pay | Admitting: Gynecologic Oncology

## 2020-07-26 ENCOUNTER — Inpatient Hospital Stay: Payer: BC Managed Care – PPO | Attending: Gynecologic Oncology | Admitting: Gynecologic Oncology

## 2020-07-26 ENCOUNTER — Other Ambulatory Visit: Payer: Self-pay

## 2020-07-26 ENCOUNTER — Other Ambulatory Visit (HOSPITAL_COMMUNITY)
Admission: RE | Admit: 2020-07-26 | Discharge: 2020-07-26 | Disposition: A | Discharge status: Home / Self Care | Payer: BC Managed Care – PPO | Source: Ambulatory Visit | Attending: Gynecologic Oncology | Admitting: Gynecologic Oncology

## 2020-07-26 VITALS — BP 117/86 | HR 99 | Temp 97.4°F | Resp 16 | Ht 66.0 in | Wt 157.6 lb

## 2020-07-26 DIAGNOSIS — Z8541 Personal history of malignant neoplasm of cervix uteri: Secondary | ICD-10-CM | POA: Diagnosis not present

## 2020-07-26 DIAGNOSIS — C52 Malignant neoplasm of vagina: Secondary | ICD-10-CM | POA: Insufficient documentation

## 2020-07-26 DIAGNOSIS — Z01419 Encounter for gynecological examination (general) (routine) without abnormal findings: Secondary | ICD-10-CM | POA: Insufficient documentation

## 2020-07-26 DIAGNOSIS — Z08 Encounter for follow-up examination after completed treatment for malignant neoplasm: Secondary | ICD-10-CM | POA: Diagnosis not present

## 2020-07-26 DIAGNOSIS — Z8544 Personal history of malignant neoplasm of other female genital organs: Secondary | ICD-10-CM | POA: Insufficient documentation

## 2020-07-26 DIAGNOSIS — A630 Anogenital (venereal) warts: Secondary | ICD-10-CM | POA: Diagnosis not present

## 2020-07-26 DIAGNOSIS — Z923 Personal history of irradiation: Secondary | ICD-10-CM | POA: Insufficient documentation

## 2020-07-26 DIAGNOSIS — Z90722 Acquired absence of ovaries, bilateral: Secondary | ICD-10-CM | POA: Diagnosis not present

## 2020-07-26 DIAGNOSIS — Z90710 Acquired absence of both cervix and uterus: Secondary | ICD-10-CM | POA: Insufficient documentation

## 2020-07-26 DIAGNOSIS — E8940 Asymptomatic postprocedural ovarian failure: Secondary | ICD-10-CM | POA: Diagnosis not present

## 2020-07-26 NOTE — Progress Notes (Signed)
Follow-up Note: Gyn-Onc   Kelly Mcclure 39 y.o. female  Chief Complaint  Patient presents with  . 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 Positive deep margin (rectovaginal septum).  S/p adjuvant whole pelvic RT with vaginal brachytherapy and radiosensitizing CDDP completed February, 2020.  Complete clinical response on post-treatment PET and physical exam.  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  Vaginal narrowing and atrophy secondary to radiation and menopause.  Plan:  Recommend continued surveillance at 3 monthly intervals x 2 years. Recommend annual paps to evaluate for new HPV related disease/dysplasia (in December annually).  Recommend continued estradiol patch (prescribed). If this is inadequate for vaginal symptoms, can add vaginal premarin or estrace.  I will see her again in September, 2022.  HPI: Patient had an abnormal Pap smear in 2011. 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 Surgical margins were positive. Therefore, the patient underwent a cold knife conization on 04/01/2010. Pathology from that specimen showed severe dysplasia with negative margins. The patient was subsequently followed with Pap smears. A pap in March 2012 was ascus. ECC was negative. Repeat ECC in June 2012 was negative. Pap smear in 03/03/2012 showed high-grade SIL and again ECC was negative. Conservative followup was recommended.  Repeat colposcopy in December 2013 identified a lesion in the posterior vaginal fornix that was biopsied. Final pathology only showed atypia. A pap smear on that same day only showed LSIL.  A Pap smear on 10/01/2012 showed high-grade SIL.  Pap smear in March was low grade SIL. Patient subsequently underwent cervical dilatation, endocervical curettage, and vaginal biopsy (03/01/2013). The only abnormality found was  atypia on vaginal biopsy.  October 2014 Pap smear with low-grade SIL.  March 21, 2016 Pap smear showed HGSIL.  Endocervical curettage showed benign endocervical glands.  Pap smear July 15, 2016 showed HGSIL.  Endocervical curettage showed benign endocervical glands.  January 13, 2018 Pap smear showed HGSIL.  Exam by Dr Fermin Schwab on July 9th showed a friable exophytic lesion on the cervix which was biopsied. Final pathology showed "at least CIN III, invasion could not be ruled out". Colpo had been difficult as the cervix was flush with the vagina and the upper vagina was narrowed.  The patient was interested in a definitive hysterectomy procedure however she required an excisional procedure to rule out invasive carcinoma before having this.  On 02/18/18 she underwent cold knife conization of the cervix in the operating room.  Inspection of the cervix with acetic acid application was grossly unremarkable with no apparent lesions seen.  The upper vagina was also grossly normal.  The cervix was small, fairly flush with the surrounding vagina, however the conization procedure was relatively straightforward.  A 0.9 cm AP dimension by 1.5 cm deep dimension cone was taken without difficulty.  There is minimal bleeding in the operating room. Final pathology revealed no residual dysplasia.  On postoperative examination a friable lesion was seen on the posterior upper vaginal wall measuring approximately 2cm. It was separate to, but abutting, the posterior lip of the cervix. 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   The patient was recommended surgical excision of the lesion, and due to its proximity with the cervix, was recommended hysterectomy (without oophorectomy) at the time of surgery in order to establish adequate margins.  The patient had her surgery scheduled (robotic hysterectomy,  upper vaginectomy), however canceled this after  she discussed the proposed surgery with her referring doctor, Dr Ronita Hipps, who had concerns about the surgical plan. Dr Ronita Hipps requested that [name] see my partner, Dr Fermin Schwab for a second opinion.   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  Given his shared concern that this lesion may harbor occult invasive disease, and due to its proximity to the cervix, he agreed with the surgical plan of hysterectomy (not oophorectomy) with salpingectomy and upper vaginectomy.   On June 01, 2018 the patient underwent a robotic assisted total hysterectomy with upper vaginectomy bilateral salpingectomy and due for pexy.  The surgical procedure was unremarkable and uncomplicated.  Operative findings were significant for a grossly normal uterus and cervix and tubes and ovaries.  A 3 x 3 cm discoid raised erythematous lesion was visualized vaginally on the posterior upper wall of the vagina with close proximity to the cervix (approximately 7 mm margin to the cervicovaginal junction.  At the completion of the procedure there was no visible remaining lesion in the vagina.  Via intraperitoneal inspection there was no eruption of the tumor through into the intraperitoneal space.  The rectovaginal septum was created with a ease and there was no gross involvement of the anterior rectal wall during surgery.  Inspection of the specimen revealed the margins to be grossly clear of disease.  At the completion of the procedure the ovaries were pexed to the pelvic brim.  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  The carcinoma involved the cauterized deep resection margin representing the rectovaginal septal margin.  Lymphovascular space invasion was present.  The cervix and uterus and  fallopian tubes are grossly unremarkable.  Postop PET showed a PET avid Rt pelvic lymph node consistent with metastatic disease (stage IIIC).   She was treated with radiosensitizing weekly CDDP with whole pelvic RT and vaginal brachytherapy (the pelvis received 25 fractions of 1.8 Gy for a total of 45 Gray, there was a pelvic nodal boost of 8 fractions of 1.8 Gray for a total of 14.4 Gy, cumulative dose to involve nodes of 59.4 Gy; the vagina received 6 Gy in 4 fractions for total dose of 24 Gray).  Treatment dates 07-14-18 to 09/27/18.   Repeat pet imaging 3 months post treatment was performed on Dec 28, 2018 and revealed response to therapy of the right pelvic nodal metastases with no increased uptake in the nodal basins and no lymphadenopathy.  There is no evidence of residual recurrent disease.  There was similar findings to the bilateral pulmonary nodules which appeared benign and stable from prior imaging.  She tolerated treatment well with no major toxicities.   Interval History:  The patient subsequently developed symptoms of early menopause with hot flashes and vaginal dryness and atrophy.  She has significant difficulty using the vaginal dilator due to vaginal narrowing and shortening. FSH was drawn and was elevated suggesting premature menopause secondary to radiation to the ovaries.  She was prescribed Vivelle-Dot estrogen replacement therapy.   CT abd/pelvis in May, 2021 showed no evidence of recurrence and no lymphadenopathy.  She has no symptoms concerning for recurrence.   Review of Systems:10 point review of systems is negative except as noted in interval history.   Vitals: Blood pressure 117/86, pulse 99, temperature (!) 97.4 F (  36.3 C), temperature source Tympanic, resp. rate 16, height '5\' 6"'  (1.676 m), weight 157 lb 9.6 oz (71.5 kg), last menstrual period 05/17/2018, SpO2 99 %.  Physical Exam: General : The patient is a healthy woman in no acute distress.  HEENT:  normocephalic, extraoccular movements normal; neck is supple without thyromegally  Lynphnodes: Supraclavicular and inguinal nodes not enlarged  Abdomen: Soft, non-tender, no ascites, no organomegally, no masses, no hernias. Soft Pelvic:  Vaginal cuff with no lesions, upper vaginal narrowing. Bleeds easily with speculum opening. Smooth vaginal walls, no palpable masses or visible lesions. Pap taken.  Discomfort with exam.    Lower extremities: No edema or varicosities. Normal range of motion   Allergies  Allergen Reactions  . Sulfa Antibiotics Swelling    Tongue swelling, throat irritated  . Adhesive [Tape] Other (See Comments)    "skin burn"  . Other Swelling and Other (See Comments)    Magic mouth wash/ causes swelling of the tongue  . Relpax [Eletriptan Hydrobromide] Other (See Comments)    Stroke-like symptoms  . Topiramate Other (See Comments)  . Ultram [Tramadol] Other (See Comments)    2004--  Per pt had seizure due to interaction with other medication she was taking at the time    Past Medical History:  Diagnosis Date  . Cancer [facility]) DX 2011   CERVICAL   . Cough    WITH SEASONAL ALLEGIES, NONPRODUCTIVE LAST 2 DAYS  . H/O radioactive iodine thyroid ablation 12/2009  . 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 History of cervical dysplasia   . History of hyperthyroidism    per pt dx 2000 -- s/p RAI  2011  . History of kidney stones 2011  . Hx of varicella   . Hypothyroidism, postradioiodine therapy    endocrinology-  dr Cruzita Lederer  . Migraines   . Mild persistent asthma 01/19/2018   followed by dr Verlin Fester ([facility]) MILD (SUBCLINICAL PER PT)  . 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 Vaginal Pap smear, abnormal     Past Surgical History:  Procedure Laterality Date  . ABDOMINAL CERCLAGE  N/A 11/19/2016   Procedure: LAPAROSCOPIC TRANSABDOMINAL CERVICAL-ISTHMUSx2;  Surgeon: Governor Specking, MD;  Location: [facility];  Service: Gynecology;  Laterality: N/A;  with ultrasound guidance. Start laparoscopically HOLD OPEN ABDOMINAL ITEMS   . CERCLAGE LAPAROSCOPIC ABDOMINAL N/A 11/19/2016   Procedure: CERCLAGE LAPAROSCOPIC ABDOMINAL;  Surgeon: Governor Specking, MD;  Location: [facility];  Service: Gynecology;  Laterality: N/A;  . CERVICAL CONIZATION W/BX N/A 02/18/2018   Procedure: COLD KNIFE CONIZATION OF CERVIX WITH POSSIBLE BIOPSY;  Surgeon: [name], MD;  Location: [place];  Service: Gynecology;  Laterality: N/A;  . CESAREAN SECTION N/A 05/27/2017   Procedure: Primary CESAREAN SECTION;  Surgeon: Brien Few, MD;  Location: [place];  Service: Obstetrics;  Laterality: N/A;  EDD: 11/4/18Allergy: Adhesive, Relpax, Sulfa, Ultram  . COLD KNIFE CONIZATION  03-29-2010   dr Ronita Hipps  [facility]  . DILATION AND CURETTAGE OF UTERUS  2009  . DILATION AND CURETTAGE OF UTERUS N/A 03/01/2013   Procedure: CERVICAL DILATATION AND ENDOCERVICAL CURRETAGE AND VAAGINAL BIOPIES;  Surgeon: Alvino Chapel, MD;  Location: [facility];  Service: Gynecology;  Laterality: N/A;  . EXTRACORPOREAL SHOCK WAVE LITHOTRIPSY Right 05/10/2020   Procedure: EXTRACORPOREAL SHOCK  WAVE LITHOTRIPSY (ESWL);  Surgeon: Robley Fries, MD;  Location: [facility];  Service: Urology;  Laterality: Right;  . IR IMAGING GUIDED PORT INSERTION  07/12/2018  . IR REMOVAL TUN ACCESS W/ PORT W/O FL MOD SED  01/14/2019  . LEEP  02/25/2010;  12/ 2012  dr Ronita Hipps office  . NASAL SEPTUM SURGERY  2006   Repair of deviated septum  . SIGMOIDOSCOPY  2009  . TONSILLECTOMY AND ADENOIDECTOMY  1995    Current Outpatient Medications  Medication Sig Dispense Refill  . busPIRone (BUSPAR) 5 MG tablet Take 5 mg by mouth 2 (two) times daily.    . chlorpheniramine-HYDROcodone (TUSSIONEX) 10-8 MG/5ML SUER Take by mouth.     . estradiol (VIVELLE-DOT) 0.1 MG/24HR patch Place 1 patch (0.1 mg total) onto the skin 2 (two) times a week. 8 patch 12  . LamoTRIgine 200 MG TB24 24 hour tablet Take 2 tablets (400 mg total) by mouth 2 (two) times daily. 360 tablet 4  . levETIRAcetam (KEPPRA) 750 MG tablet Take 2 tablets (1,500 mg total) by mouth 2 (two) times daily. 360 tablet 4  . loratadine (CLARITIN) 10 MG tablet Take 10 mg by mouth every morning.    . nystatin (MYCOSTATIN) 100000 UNIT/ML suspension Take 5 mLs by mouth 4 (four) times daily.    . Rimegepant Sulfate (NURTEC) 75 MG TBDP Take by mouth.    . SYNTHROID 125 MCG tablet Take 1 tablet (125 mcg total) by mouth daily before breakfast. 90 tablet 3   No current facility-administered medications for this visit.    Social History   Socioeconomic History  . Marital status: Single    Spouse name: Not on file  . Number of children: 1  . Years of education: Not on file  . Highest education level: Not on file  Occupational History  . Occupation: Triage  Tobacco Use  . Smoking status: Never Smoker  . Smokeless tobacco: Never Used  Vaping Use  . Vaping Use: Never used  Substance and Sexual Activity  . Alcohol use: Not Currently    Comment: SELDOM, NONE IN  10 MONTHS  . Drug use: No  . Sexual activity: Yes    Birth control/protection: Surgical  Other Topics Concern  . Not on file  Social History Narrative  . Not on file   Social Determinants of Health   Financial Resource Strain: Not on file  Food Insecurity: Not on file  Transportation Needs: Not on file  Physical Activity: Not on file  Stress: Not on file  Social Connections: Not on file  Intimate Partner Violence: Not on file    Family History  Problem Relation Age of Onset  . Leukemia Other   . Diabetes Other   . Diabetes Mother   . Skin cancer Mother   . Hyperlipidemia Mother   . Hypertension Father   . Asthma Father   . Skin cancer Maternal Aunt   . Multiple myeloma Maternal Aunt   .  Testicular cancer Maternal Uncle   . Hyperlipidemia Maternal Grandmother   . AAA (abdominal aortic aneurysm) Maternal Grandmother   . COPD Maternal Grandmother   . Prostate cancer Maternal Grandfather   . AAA (abdominal aortic aneurysm) Maternal Grandfather   . Kidney failure Maternal Grandfather     Thereasa Solo, MD 07/26/2020, 4:10 PM

## 2020-07-26 NOTE — Patient Instructions (Addendum)
Dr Denman George took a pap smear today. She will call you with the results when they are available.  Please follow-up with Dr Sondra Come as scheduled.  Please contact Dr Serita Grit office (at [phone]) in June to request an appointment with her for September, 2022.  Try placing your patch just on the right side for a while to give the left side a break. Use hydrocortisone cream on the patch site until it feels better.

## 2020-08-01 LAB — CYTOLOGY - PAP
Comment: NEGATIVE
Diagnosis: NEGATIVE
High risk HPV: NEGATIVE

## 2020-08-08 ENCOUNTER — Telehealth: Payer: Self-pay

## 2020-08-08 NOTE — Telephone Encounter (Signed)
LM for Kelly Mcclure that her Pap Smear was normal and high risk HPV negative.  She can call the office at [phone] if she has any questions or concerns.

## 2020-09-03 ENCOUNTER — Other Ambulatory Visit: Payer: Self-pay | Admitting: Gynecologic Oncology

## 2020-09-03 ENCOUNTER — Encounter: Payer: Self-pay | Admitting: Gynecologic Oncology

## 2020-09-03 DIAGNOSIS — E2839 Other primary ovarian failure: Secondary | ICD-10-CM

## 2020-09-03 MED ORDER — ESTRADIOL 1 MG PO TABS: 1.0000 mg | ORAL_TABLET | Freq: Every day | ORAL | 6 refills | Status: DC

## 2020-09-19 DIAGNOSIS — G40309 Generalized idiopathic epilepsy and epileptic syndromes, not intractable, without status epilepticus: Secondary | ICD-10-CM | POA: Diagnosis not present

## 2020-09-19 DIAGNOSIS — C52 Malignant neoplasm of vagina: Secondary | ICD-10-CM | POA: Diagnosis not present

## 2020-09-19 DIAGNOSIS — G43019 Migraine without aura, intractable, without status migrainosus: Secondary | ICD-10-CM | POA: Diagnosis not present

## 2020-09-20 DIAGNOSIS — Z03818 Encounter for observation for suspected exposure to other biological agents ruled out: Secondary | ICD-10-CM | POA: Diagnosis not present

## 2020-09-20 DIAGNOSIS — Z20822 Contact with and (suspected) exposure to covid-19: Secondary | ICD-10-CM | POA: Diagnosis not present

## 2020-09-27 ENCOUNTER — Encounter: Payer: Self-pay | Admitting: *Deleted

## 2020-10-04 ENCOUNTER — Encounter: Payer: Self-pay | Admitting: Radiation Oncology

## 2020-10-04 ENCOUNTER — Ambulatory Visit
Admission: RE | Admit: 2020-10-04 | Discharge: 2020-10-04 | Disposition: A | Discharge status: Home / Self Care | Payer: BC Managed Care – PPO | Source: Ambulatory Visit | Attending: Radiation Oncology | Admitting: Radiation Oncology

## 2020-10-04 ENCOUNTER — Other Ambulatory Visit: Payer: Self-pay

## 2020-10-04 VITALS — BP 123/87 | HR 96 | Ht 66.0 in | Wt 161.6 lb

## 2020-10-04 DIAGNOSIS — G40909 Epilepsy, unspecified, not intractable, without status epilepticus: Secondary | ICD-10-CM | POA: Insufficient documentation

## 2020-10-04 DIAGNOSIS — C539 Malignant neoplasm of cervix uteri, unspecified: Secondary | ICD-10-CM | POA: Insufficient documentation

## 2020-10-04 DIAGNOSIS — Z923 Personal history of irradiation: Secondary | ICD-10-CM | POA: Insufficient documentation

## 2020-10-04 DIAGNOSIS — Z79899 Other long term (current) drug therapy: Secondary | ICD-10-CM | POA: Diagnosis not present

## 2020-10-04 DIAGNOSIS — Z8544 Personal history of malignant neoplasm of other female genital organs: Secondary | ICD-10-CM | POA: Diagnosis not present

## 2020-10-04 DIAGNOSIS — C775 Secondary and unspecified malignant neoplasm of intrapelvic lymph nodes: Secondary | ICD-10-CM

## 2020-10-04 DIAGNOSIS — Z8589 Personal history of malignant neoplasm of other organs and systems: Secondary | ICD-10-CM | POA: Insufficient documentation

## 2020-10-04 DIAGNOSIS — G43109 Migraine with aura, not intractable, without status migrainosus: Secondary | ICD-10-CM | POA: Insufficient documentation

## 2020-10-04 DIAGNOSIS — Z08 Encounter for follow-up examination after completed treatment for malignant neoplasm: Secondary | ICD-10-CM | POA: Diagnosis not present

## 2020-10-04 DIAGNOSIS — C52 Malignant neoplasm of vagina: Secondary | ICD-10-CM

## 2020-10-04 DIAGNOSIS — E039 Hypothyroidism, unspecified: Secondary | ICD-10-CM | POA: Insufficient documentation

## 2020-10-04 NOTE — Progress Notes (Signed)
Patient in for follow up she is two years out from treatment. Does not use dilators. Denies any pain. No discharge.  BP 123/87 (BP Location: Left Arm, Patient Position: Sitting)   Pulse 96   Ht 5\' 6"  (1.676 m)   Wt 161 lb 9.6 oz (73.3 kg)   LMP 05/17/2018 (Exact Date)   SpO2 98%   BMI 26.08 kg/m  Filed Weights   10/04/20 0910  Weight: 161 lb 9.6 oz (73.3 kg)

## 2020-10-04 NOTE — Progress Notes (Signed)
Radiation Oncology         [phone] ________________________________  Name: Kelly Mcclure MRN: [id number]  Date: 10/04/2020  DOB: 1980/12/11  Follow-Up Visit Note  CC: Brien Few, MD  [name], MD    ICD-10-CM   1. Vaginal cancer ([place])  C52   2. 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 07/14/2018 - 08/19/2018 / Pelvis; 25 fractions of 1.8 Gy for a total of 45 Gy 2. 08/20/2018 - 08/31/2018 / Pelvis (nodal boost) ; 8 fractions of 1.8 Gy for a total of 14.4 Gy (59.4 Gy cumulative to involved nodes) 3. 09/06/2018, 09/15/2018, 09/22/2018, 09/27/2018 / Vagina; 6 Gy in 4 fractions for a total dose of 24 Gy (brachytherapy treatment)  Narrative:  The patient returns today for routine follow-up. She was last seen by Dr. Denman George on 07/26/2020, at which time she was noted to have had a complete clinical response. PAP smear performed during that visit was negative for intraepithelial lesion or malignancy.  In addition the patient underwent lithotripsy in the fall for a renal calculi.  This was successful.  The patient denies any further discomfort along the flank area.  Patient has been switched from Estrace patches to a pill form.  The patch caused skin irritation for her.  We discussed potential use of vaginal estrogen in addition and she feels comfortable with her current situation.  Unfortunately in light of her busy lifestyle at home as well as full-time work she has not been consistent with using her vaginal dilator.  We stressed the importance of using this particularly with her young age.  On review of systems, she reports feeling well.  She denies any abdominal bloating or pelvic pain.. She denies vaginal bleeding or discharge.  She denies any hematuria or rectal  bleeding..  ALLERGIES:  is allergic to sulfa antibiotics, adhesive [tape], other, relpax [eletriptan hydrobromide], topiramate, and ultram [tramadol].  Meds: Current Outpatient Medications  Medication Sig Dispense Refill  . ALPRAZolam (XANAX) 0.5 MG tablet Take 0.5 mg by mouth 2 (two) times daily as needed for anxiety.    Marland Kitchen estradiol (ESTRACE) 1 MG tablet Take 1 tablet (1 mg total) by mouth daily. 90 tablet 6  . LamoTRIgine 200 MG TB24 24 hour tablet Take 2 tablets (400 mg total) by mouth 2 (two) times daily. 360 tablet 4  . levETIRAcetam (KEPPRA) 750 MG tablet Take 2 tablets (1,500 mg total) by mouth 2 (two) times daily. 360 tablet 4  . loratadine (CLARITIN) 10 MG tablet Take 10 mg by mouth every morning.    . Rimegepant Sulfate (NURTEC) 75 MG TBDP Take by mouth.    . SYNTHROID 125 MCG tablet Take 1 tablet (125 mcg total) by mouth daily before breakfast. 90 tablet 3   No current facility-administered medications for this encounter.    Physical Findings: The patient is in no acute distress. Patient is alert and oriented.  height is 5\' 6"  (1.676 m) and weight is 161 lb 9.6 oz (73.3 kg). Her blood pressure is 123/87 and her pulse is 96. Her oxygen saturation is 98%.  Lungs are clear to auscultation bilaterally. Heart has regular rate and rhythm. No palpable cervical, supraclavicular, or axillary adenopathy. Abdomen soft, non-tender, normal bowel sounds.  No palpable inguinal adenopathy On pelvic examination the external genitalia were unremarkable. The vaginal  vault is significantly narrowed but would permit index finger. Patient could not tolerate the small speculum so pediatric speculum was used. The vaginal mucosa was noted to be thin and bled easily. No mucosal lesions noted. No palpable mass on digital and bimanual exam. Some adhesions were noted in the upper vaginal vault.   Lab Findings: Lab Results  Component Value Date   WBC 5.3 12/05/2019   HGB 14.2 12/05/2019   HCT 44.8  12/05/2019   MCV 82.7 12/05/2019   PLT 221 12/05/2019    Radiographic Findings: No results found.  Impression: Stage III vaginal cancer(pT1b,cN1,M0invasive moderately differentiated squamous cell carcinoma)  No evidence of recurrence on clinical exam today.   Plan: Now that the patient is two years out from last treatment, she will follow-up with Dr. Denman George in six months.  The patient will see Dr. Denman George in September.  I have scheduled her for for follow-up in December for her yearly Pap smear but if Dr. Denman George does the Pap smear in September then she can follow-up with me 6 months after  Dr. Serita Grit appointment (march 2023).  Total time spent in this encounter was 20 minutes which included reviewing the patient's most recent follow-up with Dr. Denman George, PAP smear, physical examination, and documentation.  ___________________________________   Blair Promise, PhD, MD  This document serves as a record of services personally performed by Gery Pray, MD. It was created on his behalf by Clerance Lav, a trained medical scribe. The creation of this record is based on the scribe's personal observations and the provider's statements to them. This document has been checked and approved by the attending provider.

## 2020-11-14 DIAGNOSIS — Z01419 Encounter for gynecological examination (general) (routine) without abnormal findings: Secondary | ICD-10-CM | POA: Diagnosis not present

## 2020-11-14 DIAGNOSIS — Z6825 Body mass index (BMI) 25.0-25.9, adult: Secondary | ICD-10-CM | POA: Diagnosis not present

## 2020-12-03 ENCOUNTER — Telehealth: Payer: Self-pay | Admitting: *Deleted

## 2020-12-03 ENCOUNTER — Encounter: Payer: Self-pay | Admitting: Gynecologic Oncology

## 2020-12-03 NOTE — Telephone Encounter (Signed)
Called the patient and explained that her next visit with Dr Denman George is in September. Ask the patient to call in July to schedule appt, also explained that Dr Clabe Seal appt needs to be moved from December to March.

## 2020-12-04 ENCOUNTER — Other Ambulatory Visit: Payer: Self-pay | Admitting: Gynecologic Oncology

## 2020-12-04 DIAGNOSIS — E2839 Other primary ovarian failure: Secondary | ICD-10-CM

## 2020-12-04 MED ORDER — ESTRADIOL 1 MG PO TABS: 1.0000 mg | ORAL_TABLET | Freq: Every day | ORAL | 6 refills | Status: DC

## 2020-12-12 DIAGNOSIS — F438 Other reactions to severe stress: Secondary | ICD-10-CM | POA: Diagnosis not present

## 2020-12-21 DIAGNOSIS — F438 Other reactions to severe stress: Secondary | ICD-10-CM | POA: Diagnosis not present

## 2020-12-27 DIAGNOSIS — Z0000 Encounter for general adult medical examination without abnormal findings: Secondary | ICD-10-CM | POA: Diagnosis not present

## 2020-12-27 DIAGNOSIS — Z130 Encounter for screening for diseases of the blood and blood-forming organs and certain disorders involving the immune mechanism: Secondary | ICD-10-CM | POA: Diagnosis not present

## 2020-12-27 DIAGNOSIS — Z1329 Encounter for screening for other suspected endocrine disorder: Secondary | ICD-10-CM | POA: Diagnosis not present

## 2020-12-27 DIAGNOSIS — Z13220 Encounter for screening for lipoid disorders: Secondary | ICD-10-CM | POA: Diagnosis not present

## 2020-12-27 DIAGNOSIS — F438 Other reactions to severe stress: Secondary | ICD-10-CM | POA: Diagnosis not present

## 2020-12-27 DIAGNOSIS — Z131 Encounter for screening for diabetes mellitus: Secondary | ICD-10-CM | POA: Diagnosis not present

## 2020-12-30 ENCOUNTER — Other Ambulatory Visit: Payer: Self-pay

## 2020-12-30 ENCOUNTER — Encounter (HOSPITAL_BASED_OUTPATIENT_CLINIC_OR_DEPARTMENT_OTHER): Payer: Self-pay

## 2020-12-30 ENCOUNTER — Emergency Department (HOSPITAL_BASED_OUTPATIENT_CLINIC_OR_DEPARTMENT_OTHER)
Admission: EM | Admit: 2020-12-30 | Discharge: 2020-12-30 | Disposition: A | Discharge status: Home / Self Care | Payer: BC Managed Care – PPO | Attending: Emergency Medicine | Admitting: Emergency Medicine

## 2020-12-30 ENCOUNTER — Emergency Department (HOSPITAL_BASED_OUTPATIENT_CLINIC_OR_DEPARTMENT_OTHER): Payer: BC Managed Care – PPO

## 2020-12-30 DIAGNOSIS — E039 Hypothyroidism, unspecified: Secondary | ICD-10-CM | POA: Diagnosis not present

## 2020-12-30 DIAGNOSIS — J45909 Unspecified asthma, uncomplicated: Secondary | ICD-10-CM | POA: Insufficient documentation

## 2020-12-30 DIAGNOSIS — Z79899 Other long term (current) drug therapy: Secondary | ICD-10-CM | POA: Diagnosis not present

## 2020-12-30 DIAGNOSIS — N200 Calculus of kidney: Secondary | ICD-10-CM | POA: Diagnosis not present

## 2020-12-30 DIAGNOSIS — R109 Unspecified abdominal pain: Secondary | ICD-10-CM | POA: Diagnosis not present

## 2020-12-30 DIAGNOSIS — N210 Calculus in bladder: Secondary | ICD-10-CM | POA: Diagnosis not present

## 2020-12-30 DIAGNOSIS — N23 Unspecified renal colic: Secondary | ICD-10-CM | POA: Diagnosis not present

## 2020-12-30 DIAGNOSIS — N1330 Unspecified hydronephrosis: Secondary | ICD-10-CM | POA: Diagnosis not present

## 2020-12-30 LAB — COMPREHENSIVE METABOLIC PANEL
ALT: 27 U/L (ref 0–44)
AST: 20 U/L (ref 15–41)
Albumin: 4.6 g/dL (ref 3.5–5.0)
Alkaline Phosphatase: 95 U/L (ref 38–126)
Anion gap: 11 (ref 5–15)
BUN: 12 mg/dL (ref 6–20)
CO2: 26 mmol/L (ref 22–32)
Calcium: 9.4 mg/dL (ref 8.9–10.3)
Chloride: 100 mmol/L (ref 98–111)
Creatinine, Ser: 0.71 mg/dL (ref 0.44–1.00)
GFR, Estimated: >60 mL/min (ref >60)
Glucose, Bld: 100 mg/dL — ABNORMAL HIGH (ref 70–99)
Potassium: 4 mmol/L (ref 3.5–5.1)
Sodium: 137 mmol/L (ref 135–145)
Total Bilirubin: 0.4 mg/dL (ref 0.3–1.2)
Total Protein: 7 g/dL (ref 6.5–8.1)

## 2020-12-30 LAB — CBC
HCT: 43 % (ref 36.0–46.0)
Hemoglobin: 13.8 g/dL (ref 12.0–15.0)
MCH: 26.5 pg (ref 26.0–34.0)
MCHC: 32.1 g/dL (ref 30.0–36.0)
MCV: 82.5 fL (ref 80.0–100.0)
Platelets: 255 10*3/uL (ref 150–400)
RBC: 5.21 MIL/uL — ABNORMAL HIGH (ref 3.87–5.11)
RDW: 13.6 % (ref 11.5–15.5)
WBC: 10.2 10*3/uL (ref 4.0–10.5)
nRBC: 0 % (ref 0.0–0.2)

## 2020-12-30 LAB — URINALYSIS, ROUTINE W REFLEX MICROSCOPIC
Bilirubin Urine: NEGATIVE
Glucose, UA: NEGATIVE mg/dL
Ketones, ur: NEGATIVE mg/dL
Leukocytes,Ua: NEGATIVE
Nitrite: NEGATIVE
RBC / HPF: >50 RBC/hpf — ABNORMAL HIGH (ref 0–5)
Specific Gravity, Urine: 1.023 (ref 1.005–1.030)
pH: 5.5 (ref 5.0–8.0)

## 2020-12-30 LAB — PREGNANCY, URINE: Preg Test, Ur: NEGATIVE

## 2020-12-30 MED ORDER — OXYCODONE-ACETAMINOPHEN 5-325 MG PO TABS: 1.0000 | ORAL_TABLET | Freq: Four times a day (QID) | ORAL | 0 refills | Status: AC | PRN

## 2020-12-30 MED ORDER — ONDANSETRON HCL 4 MG/2ML IJ SOLN
4.0000 mg | Freq: Once | INTRAMUSCULAR | Status: AC
  Administered 2020-12-30: 4 mg via INTRAVENOUS
  Filled 2020-12-30: qty 2

## 2020-12-30 MED ORDER — NAPROXEN 500 MG PO TABS: 500.0000 mg | ORAL_TABLET | Freq: Two times a day (BID) | ORAL | 0 refills | Status: DC | PRN

## 2020-12-30 MED ORDER — HYDROMORPHONE HCL 1 MG/ML IJ SOLN
1.0000 mg | Freq: Once | INTRAMUSCULAR | Status: AC
  Administered 2020-12-30: 1 mg via INTRAVENOUS
  Filled 2020-12-30: qty 1

## 2020-12-30 MED ORDER — ONDANSETRON 4 MG PO TBDP: 4.0000 mg | ORAL_TABLET | Freq: Three times a day (TID) | ORAL | 0 refills | Status: DC | PRN

## 2020-12-30 MED ORDER — OXYCODONE-ACETAMINOPHEN 5-325 MG PO TABS: 1.0000 | ORAL_TABLET | Freq: Four times a day (QID) | ORAL | 0 refills | Status: DC | PRN

## 2020-12-30 MED ORDER — PROMETHAZINE HCL 25 MG/ML IJ SOLN
INTRAMUSCULAR | Status: AC
  Filled 2020-12-30: qty 1

## 2020-12-30 MED ORDER — SODIUM CHLORIDE 0.9 % IV SOLN
12.5000 mg | Freq: Once | INTRAVENOUS | Status: AC
  Administered 2020-12-30: 12.5 mg via INTRAVENOUS
  Filled 2020-12-30: qty 0.5

## 2020-12-30 MED ORDER — KETOROLAC TROMETHAMINE 15 MG/ML IJ SOLN
15.0000 mg | Freq: Once | INTRAMUSCULAR | Status: AC
  Administered 2020-12-30: 15 mg via INTRAVENOUS
  Filled 2020-12-30: qty 1

## 2020-12-30 NOTE — ED Provider Notes (Signed)
[name] EMERGENCY DEPT Provider Note   CSN: [id number] Arrival date & time: 12/30/20  1828     History Chief Complaint  Patient presents with  . Flank Pain    Kelly Mcclure is a 40 y.o. female.  Presenting to ER with concern for flank pain.  Patient reports that earlier today she had sudden onset of right-sided flank pain.  Radiates from her right side to right groin region.  Constantly has pain that does seem to come in more severe waves.  Up to 10-10 in severity sharp and stabbing.  No alleviating factors identified.  Some nausea and a little bit of vomiting.  Nonbloody nonbilious.  No dysuria or hematuria, no fevers.  Does have history of prior kidney stones and this feels similar.  HPI     Past Medical History:  Diagnosis Date  . Cancer [facility]) DX 2011   CERVICAL   . Cough    WITH SEASONAL ALLEGIES, NONPRODUCTIVE LAST 2 DAYS  . H/O radioactive iodine thyroid ablation 12/2009  . 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 History of cervical dysplasia   . History of hyperthyroidism    per pt dx 2000 -- s/p RAI  2011  . History of kidney stones 2011  . History of radiation therapy 07/14/2018-09/27/2018   Pelvic/vagina HDR; Dr. Gery Pray  . Hx of varicella   . Hypothyroidism, postradioiodine therapy    endocrinology-  dr Cruzita Lederer  . Migraines   . Mild persistent asthma 01/19/2018   followed by dr Verlin Fester (allergy and asthma center) MILD (SUBCLINICAL PER PT)  . Seizure disorder [facility]) neurologist-  dr Krista Blue   Dx age 44-- per pt subclinical epilepsy-- controlled w/ meds,  last seizure 08/ 2018  . 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 Vaginal Pap smear, abnormal     Patient Active Problem List   Diagnosis Date Noted  . Migraine with aura 10/04/2020  . Seizure disorder ([place]) 10/04/2020  . Cervical cancer ([place]) 10/04/2020  . Hypothyroidism 10/04/2020  . Shingles 11/17/2019  . 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 Early menopause occurring in patient age younger than 38 years 07/14/2019  . URI (upper respiratory infection) 08/19/2018  . Nausea without vomiting 08/05/2018  . Pancytopenia, acquired ([place]) 07/29/2018  . Other fatigue 07/22/2018  . Diarrhea 07/22/2018  . Lymphadenopathy of head and neck 07/08/2018  . 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 Perennial and seasonal allergic rhinitis 01/19/2018  . Mild persistent asthma 01/19/2018  . 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 Postpartum care following cesarean delivery (10/24) 05/27/2017  . Epilepsy ([place]) 03/19/2017  . Maternal seizure disorder during pregnancy in second trimester ([place]) 03/15/2017  . Seizure disorder during pregnancy in second trimester ([place]) 03/15/2017  . 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 ABDOMINAL CERCLAGE N/A 11/19/2016   Procedure: LAPAROSCOPIC TRANSABDOMINAL CERVICAL-ISTHMUSx2;  Surgeon: Governor Specking, MD;  Location: [facility];  Service: Gynecology;  Laterality: N/A;  with ultrasound guidance. Start laparoscopically HOLD OPEN ABDOMINAL ITEMS   . CERCLAGE LAPAROSCOPIC ABDOMINAL N/A 11/19/2016   Procedure: CERCLAGE LAPAROSCOPIC ABDOMINAL;  Surgeon: Governor Specking, MD;  Location: [facility];  Service: Gynecology;  Laterality: N/A;  . CERVICAL CONIZATION W/BX N/A 02/18/2018   Procedure: COLD KNIFE CONIZATION OF CERVIX WITH POSSIBLE BIOPSY;  Surgeon: [name], MD;  Location: [facility];  Service: Gynecology;  Laterality: N/A;  . CESAREAN SECTION N/A 05/27/2017   Procedure: Primary CESAREAN SECTION;  Surgeon: Brien Few, MD;  Location: [facility];  Service: Obstetrics;  Laterality: N/A;  EDD: 11/4/18Allergy: Adhesive, Relpax, Sulfa, Ultram  . COLD KNIFE CONIZATION  03-29-2010   dr Ronita Hipps  [facility]  .  DILATION AND CURETTAGE OF UTERUS  2009  . DILATION AND CURETTAGE OF UTERUS N/A 03/01/2013   Procedure: CERVICAL DILATATION AND ENDOCERVICAL CURRETAGE AND VAAGINAL BIOPIES;  Surgeon: Alvino Chapel, MD;  Location: [facility];  Service: Gynecology;  Laterality: N/A;  . EXTRACORPOREAL SHOCK WAVE LITHOTRIPSY Right 05/10/2020   Procedure: EXTRACORPOREAL SHOCK WAVE LITHOTRIPSY (ESWL);  Surgeon: Robley Fries, MD;  Location[facility];  Service: Urology;  Laterality: Right;  . IR IMAGING GUIDED PORT INSERTION  07/12/2018  . IR REMOVAL TUN ACCESS W/ PORT W/O FL MOD SED  01/14/2019  . LEEP  02/25/2010;  12/ 2012  dr Ronita Hipps office  . NASAL SEPTUM SURGERY  2006   Repair of deviated septum  . SIGMOIDOSCOPY  2009  . TONSILLECTOMY AND ADENOIDECTOMY  1995     OB History    Gravida  3   Para  1   Term  1   Preterm      AB  2   Living  0     SAB  2   IAB      Ectopic      Multiple  0   Live Births              Family History  Problem Relation Age of Onset  . Leukemia Other   . Diabetes Other   . Diabetes Mother   . Skin cancer Mother   . Hyperlipidemia Mother   . Hypertension Father   . Asthma Father   . Skin cancer Maternal Aunt   . Multiple myeloma Maternal Aunt   . Testicular cancer Maternal Uncle   . Hyperlipidemia Maternal Grandmother   . AAA (abdominal aortic aneurysm) Maternal Grandmother   . COPD Maternal Grandmother   . Prostate cancer Maternal Grandfather   . AAA (abdominal aortic aneurysm) Maternal Grandfather   . Kidney failure Maternal Grandfather     Social History   Tobacco Use  . Smoking status: Never Smoker  . Smokeless tobacco: Never Used  Vaping Use  . Vaping Use: Never used  Substance Use Topics  . Alcohol use: Not Currently    Comment: SELDOM, NONE IN  10 MONTHS  . Drug use: No    Home Medications Prior to Admission medications   Medication Sig Start Date End Date Taking? Authorizing Provider  ALPRAZolam Duanne Moron)  0.5 MG tablet Take 0.5 mg by mouth 2 (two) times daily as needed for anxiety.    [provider]  estradiol (ESTRACE) 1 MG tablet Take 1 tablet (1 mg total) by mouth daily. 12/04/20   [name] D, NP  LamoTRIgine 200 MG TB24 24 hour tablet Take 2 tablets (400 mg total) by mouth 2 (two) times daily. 12/23/18   Marcial Pacas, MD  levETIRAcetam (KEPPRA) 750 MG tablet Take 2 tablets (1,500 mg total) by mouth 2 (two) times daily. 12/23/18   Marcial Pacas, MD  loratadine (CLARITIN) 10 MG tablet Take 10 mg by mouth every morning.    [provider]  naproxen (NAPROSYN) 500 MG tablet Take 1 tablet (500 mg total) by mouth 2 (two) times daily as needed. 12/30/20  Lucrezia Starch, MD  ondansetron (ZOFRAN ODT) 4 MG disintegrating tablet Take 1 tablet (4 mg total) by mouth every 8 (eight) hours as needed for nausea or vomiting. 12/30/20   Lucrezia Starch, MD  oxyCODONE-acetaminophen (PERCOCET/ROXICET) 5-325 MG tablet Take 1 tablet by mouth every 6 (six) hours as needed for up to 3 days for severe pain. 12/30/20 01/02/21  Lucrezia Starch, MD  Rimegepant Sulfate (NURTEC) 75 MG TBDP Take by mouth. 04/27/19   [provider]  SYNTHROID 125 MCG tablet Take 1 tablet (125 mcg total) by mouth daily before breakfast. 04/06/20   Philemon Kingdom, MD    Allergies    Sulfa antibiotics, Adhesive [tape], Other, Relpax [eletriptan hydrobromide], Topiramate, and Ultram [tramadol]  Review of Systems   Review of Systems  Constitutional: Negative for chills and fever.  HENT: Negative for ear pain and sore throat.   Eyes: Negative for pain and visual disturbance.  Respiratory: Negative for cough and shortness of breath.   Cardiovascular: Negative for chest pain and palpitations.  Gastrointestinal: Negative for abdominal pain and vomiting.  Genitourinary: Positive for flank pain. Negative for dysuria and hematuria.  Musculoskeletal: Negative for arthralgias and back pain.  Skin: Negative for color  change and rash.  Neurological: Negative for seizures and syncope.  All other systems reviewed and are negative.   Physical Exam Updated Vital Signs BP (!) 118/91 (BP Location: Right Arm)   Pulse 90   Temp 98.7 F (37.1 C) (Oral)   Resp 16   Ht _0  (1.651 m)   Wt 72.7 kg   LMP 05/17/2018 (Exact Date)   SpO2 96%   BMI 26.67 kg/m   Physical Exam Vitals and nursing note reviewed.  Constitutional:      General: She is not in acute distress.    Appearance: She is well-developed.     Comments: Appears uncomfortable secondary to pain  HENT:     Head: Normocephalic and atraumatic.  Eyes:     Conjunctiva/sclera: Conjunctivae normal.  Cardiovascular:     Rate and Rhythm: Normal rate and regular rhythm.     Heart sounds: No murmur heard.   Pulmonary:     Effort: Pulmonary effort is normal. No respiratory distress.     Breath sounds: Normal breath sounds.  Abdominal:     Palpations: Abdomen is soft.     Tenderness: There is no abdominal tenderness.     Comments: Abdomen is entirely soft and nontender, there is some tenderness over the right flank  Musculoskeletal:     Cervical back: Neck supple.  Skin:    General: Skin is warm and dry.  Neurological:     Mental Status: She is alert.     ED Results / Procedures / Treatments   Labs (all labs ordered are listed, but only abnormal results are displayed) Labs Reviewed  COMPREHENSIVE METABOLIC PANEL - Abnormal; Notable for the following components:      Result Value   Glucose, Bld 100 (*)    All other components within normal limits  CBC - Abnormal; Notable for the following components:   RBC 5.21 (*)    All other components within normal limits  URINALYSIS, ROUTINE W REFLEX MICROSCOPIC - Abnormal; Notable for the following components:   APPearance HAZY (*)    Hgb urine dipstick LARGE (*)    Protein, ur TRACE (*)    RBC / HPF >50 (*)    All other components within normal limits  PREGNANCY, URINE  EKG None  Radiology CT Renal Stone Study  Result Date: 12/30/2020 CLINICAL DATA:  Right-sided flank pain EXAM: CT ABDOMEN AND PELVIS WITHOUT CONTRAST TECHNIQUE: Multidetector CT imaging of the abdomen and pelvis was performed following the standard protocol without IV contrast. COMPARISON:  12/06/2019 FINDINGS: Lower chest: Lung bases demonstrate no acute consolidation or effusion. Normal cardiac size. Hepatobiliary: No focal liver abnormality is seen. No gallstones, gallbladder wall thickening, or biliary dilatation. Pancreas: Unremarkable. No pancreatic ductal dilatation or surrounding inflammatory changes. Spleen: Normal in size without focal abnormality. Adrenals/Urinary Tract: Adrenal glands are normal. Mild right hydronephrosis and hydroureter. 4 mm stone in the right posterior bladder either representing impending passage of stone or recently passed stone. Stomach/Bowel: Stomach is within normal limits. Appendix appears normal. No evidence of bowel wall thickening, distention, or inflammatory changes. Vascular/Lymphatic: No significant vascular findings are present. No enlarged abdominal or pelvic lymph nodes. Reproductive: Status post hysterectomy. No adnexal masses. Other: Negative for free air or free fluid. Fat containing umbilical and periumbilical hernia. Musculoskeletal: No acute osseous abnormality IMPRESSION: 1. Mild right hydronephrosis and hydroureter, with 4 mm stone at the right posterior bladder, thought to represent impending passage of stone or recently passed stone. 2. Small fat containing umbilical and periumbilical hernia Electronically Signed   By: Donavan Foil M.D.   On: 12/30/2020 22:21    Procedures Procedures   Medications Ordered in ED Medications  ketorolac (TORADOL) 15 MG/ML injection 15 mg (15 mg Intravenous Given 12/30/20 2122)  ondansetron (ZOFRAN) injection 4 mg (4 mg Intravenous Given 12/30/20 2122)  HYDROmorphone (DILAUDID) injection 1 mg (1 mg Intravenous  Given 12/30/20 2219)  promethazine (PHENERGAN) 12.5 mg in sodium chloride 0.9 % 50 mL IVPB (0 mg Intravenous Stopped 12/30/20 2235)  promethazine (PHENERGAN) 25 MG/ML injection (  Given 12/30/20 2220)    ED Course  I have reviewed the triage vital signs and the nursing notes.  Pertinent labs & imaging results that were available during my care of the patient were reviewed by me and considered in my medical decision making (see chart for details).    MDM Rules/Calculators/A&P                          40 year old lady presented to ER with concern for right flank pain, similar to past kidney stone.  On exam patient appeared uncomfortable initially, normal vital signs and afebrile.  UA with some blood.  No infection.  Basic labs stable.  CT with mild right hydronephrosis and stone measuring 4 mm noted in the posterior bladder.  Suspect recently passed stone or impending passage.  On reassessment, patient symptoms were well controlled with pain and antiemetics in ER.  Tolerating p.o.  Given current appearance, stable for discharge and outpatient management.  Recommend follow-up with urology.   After the discussed management above, the patient was determined to be safe for discharge.  The patient was in agreement with this plan and all questions regarding their care were answered.  ED return precautions were discussed and the patient will return to the ED with any significant worsening of condition.   Final Clinical Impression(s) / ED Diagnoses Final diagnoses:  Renal colic  Kidney stone    Rx / DC Orders ED Discharge Orders         Ordered    naproxen (NAPROSYN) 500 MG tablet  2 times daily PRN,   Status:  Discontinued        12/30/20 2306  oxyCODONE-acetaminophen (PERCOCET/ROXICET) 5-325 MG tablet  Every 6 hours PRN,   Status:  Discontinued        12/30/20 2306    ondansetron (ZOFRAN ODT) 4 MG disintegrating tablet  Every 8 hours PRN,   Status:  Discontinued        12/30/20 2306     oxyCODONE-acetaminophen (PERCOCET/ROXICET) 5-325 MG tablet  Every 6 hours PRN        12/30/20 2318    ondansetron (ZOFRAN ODT) 4 MG disintegrating tablet  Every 8 hours PRN        12/30/20 2318    naproxen (NAPROSYN) 500 MG tablet  2 times daily PRN        12/30/20 2318           Lucrezia Starch, MD 12/31/20 1557

## 2020-12-30 NOTE — Discharge Instructions (Signed)
Follow-up with your urologist.  Return to ER if you develop fever, vomiting, uncontrolled pain or other new concerning symptom.

## 2020-12-30 NOTE — ED Notes (Signed)
This RN presented the AVS utilizing Teachback Method. Patient verbalizes understanding of Discharge Instructions. Opportunity for Questioning and Answers were provided. Patient Discharged from ED ambulatory to Home with Mother.

## 2020-12-30 NOTE — ED Notes (Signed)
Patient transported to CT at this time. 

## 2020-12-30 NOTE — ED Triage Notes (Signed)
Pt is present for right flank pain and states, 'I think I have a kidney stone again". Pt also c/o nausea due to the pain. Hx of kidney stones and lithotripsy done Fall 2021.

## 2021-01-01 DIAGNOSIS — F438 Other reactions to severe stress: Secondary | ICD-10-CM | POA: Diagnosis not present

## 2021-01-07 ENCOUNTER — Other Ambulatory Visit: Payer: Self-pay | Admitting: Gynecologic Oncology

## 2021-01-07 DIAGNOSIS — C52 Malignant neoplasm of vagina: Secondary | ICD-10-CM

## 2021-01-10 DIAGNOSIS — F438 Other reactions to severe stress: Secondary | ICD-10-CM | POA: Diagnosis not present

## 2021-01-15 DIAGNOSIS — F438 Other reactions to severe stress: Secondary | ICD-10-CM | POA: Diagnosis not present

## 2021-01-17 DIAGNOSIS — J029 Acute pharyngitis, unspecified: Secondary | ICD-10-CM | POA: Diagnosis not present

## 2021-01-17 DIAGNOSIS — U071 COVID-19: Secondary | ICD-10-CM | POA: Diagnosis not present

## 2021-01-17 DIAGNOSIS — J069 Acute upper respiratory infection, unspecified: Secondary | ICD-10-CM | POA: Diagnosis not present

## 2021-01-17 DIAGNOSIS — G4489 Other headache syndrome: Secondary | ICD-10-CM | POA: Diagnosis not present

## 2021-01-17 DIAGNOSIS — Z20822 Contact with and (suspected) exposure to covid-19: Secondary | ICD-10-CM | POA: Diagnosis not present

## 2021-01-22 DIAGNOSIS — F438 Other reactions to severe stress: Secondary | ICD-10-CM | POA: Diagnosis not present

## 2021-01-30 DIAGNOSIS — F438 Other reactions to severe stress: Secondary | ICD-10-CM | POA: Diagnosis not present

## 2021-02-04 DIAGNOSIS — F438 Other reactions to severe stress: Secondary | ICD-10-CM | POA: Diagnosis not present

## 2021-02-19 ENCOUNTER — Encounter: Payer: Self-pay | Admitting: Gynecologic Oncology

## 2021-02-20 ENCOUNTER — Inpatient Hospital Stay: Payer: BC Managed Care – PPO | Admitting: Gynecologic Oncology

## 2021-02-21 DIAGNOSIS — R0982 Postnasal drip: Secondary | ICD-10-CM | POA: Diagnosis not present

## 2021-02-21 DIAGNOSIS — J069 Acute upper respiratory infection, unspecified: Secondary | ICD-10-CM | POA: Diagnosis not present

## 2021-02-26 DIAGNOSIS — F438 Other reactions to severe stress: Secondary | ICD-10-CM | POA: Diagnosis not present

## 2021-03-04 ENCOUNTER — Telehealth: Payer: Self-pay

## 2021-03-04 NOTE — Telephone Encounter (Signed)
Spoke with Kelly Mcclure regarding her appt on 8/4 with Dr. Denman George and the need to come in earlier. Patient has been rescheduled for 0830 that day. Patient is in agreement of new time.

## 2021-03-06 DIAGNOSIS — F438 Other reactions to severe stress: Secondary | ICD-10-CM | POA: Diagnosis not present

## 2021-03-07 ENCOUNTER — Ambulatory Visit: Payer: BC Managed Care – PPO | Admitting: Gynecologic Oncology

## 2021-03-07 ENCOUNTER — Inpatient Hospital Stay: Payer: BC Managed Care – PPO | Attending: Gynecologic Oncology | Admitting: Gynecologic Oncology

## 2021-03-07 ENCOUNTER — Encounter: Payer: Self-pay | Admitting: Gynecologic Oncology

## 2021-03-07 ENCOUNTER — Other Ambulatory Visit: Payer: Self-pay

## 2021-03-07 VITALS — BP 117/94 | HR 100 | Temp 98.1°F | Resp 18 | Ht 65.0 in | Wt 156.0 lb

## 2021-03-07 DIAGNOSIS — C775 Secondary and unspecified malignant neoplasm of intrapelvic lymph nodes: Secondary | ICD-10-CM | POA: Diagnosis not present

## 2021-03-07 DIAGNOSIS — Z9079 Acquired absence of other genital organ(s): Secondary | ICD-10-CM | POA: Insufficient documentation

## 2021-03-07 DIAGNOSIS — E8940 Asymptomatic postprocedural ovarian failure: Secondary | ICD-10-CM | POA: Diagnosis not present

## 2021-03-07 DIAGNOSIS — Z923 Personal history of irradiation: Secondary | ICD-10-CM | POA: Diagnosis not present

## 2021-03-07 DIAGNOSIS — E039 Hypothyroidism, unspecified: Secondary | ICD-10-CM | POA: Diagnosis not present

## 2021-03-07 DIAGNOSIS — Z90710 Acquired absence of both cervix and uterus: Secondary | ICD-10-CM | POA: Diagnosis not present

## 2021-03-07 DIAGNOSIS — Z79899 Other long term (current) drug therapy: Secondary | ICD-10-CM | POA: Insufficient documentation

## 2021-03-07 DIAGNOSIS — Z79890 Hormone replacement therapy (postmenopausal): Secondary | ICD-10-CM | POA: Diagnosis not present

## 2021-03-07 DIAGNOSIS — G40909 Epilepsy, unspecified, not intractable, without status epilepticus: Secondary | ICD-10-CM | POA: Diagnosis not present

## 2021-03-07 DIAGNOSIS — Z791 Long term (current) use of non-steroidal anti-inflammatories (NSAID): Secondary | ICD-10-CM | POA: Diagnosis not present

## 2021-03-07 DIAGNOSIS — C52 Malignant neoplasm of vagina: Secondary | ICD-10-CM

## 2021-03-07 NOTE — Patient Instructions (Signed)
Dr Serita Grit office will contact you with your pap results.  Please have Dr Clabe Seal office contact Dr Serita Grit office (at [phone]) in February, 2023 after your appointment with him to request an appointment with Dr Serita Grit partner or Lenna Sciara in August, 2023.

## 2021-03-07 NOTE — Progress Notes (Signed)
Follow-up Note: Gyn-Onc   Kelly Mcclure 40 y.o. 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 Positive deep margin (rectovaginal septum).  S/p adjuvant whole pelvic RT with vaginal brachytherapy and radiosensitizing CDDP completed February, 2020.  Complete clinical response on post-treatment PET and physical exam.  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  Vaginal narrowing and atrophy secondary to radiation and menopause.  Plan:  Recommend continued surveillance at 6 monthly intervals until February, 2025 (if she remains disease free). She will alternate these visits with Dr Sondra Come and my partner. Recommend annual paps to evaluate for new HPV related disease/dysplasia (in August annually).  Recommend continued oral estrogens.    HPI: Patient had a history of stage IA1 diagnosed by a LEEP and repeat CKC on 04/01/2010.  Fertility preservation was chosen.  The patient was subsequently followed with Pap smears.   She had intermittently abnormal paps smears over the subsequent years with close follow-up and regular colposcopic evaluations.  March 21, 2016 Pap smear showed HGSIL.  Endocervical curettage showed benign endocervical glands.  Pap smear July 15, 2016 showed HGSIL.  Endocervical curettage showed benign endocervical glands.  January 13, 2018 Pap smear showed HGSIL.  An exam by Dr Fermin Schwab on July 9th showed a friable exophytic lesion on the cervix which was biopsied. Final pathology showed "at least CIN III, invasion could not be ruled out". Colpo had been difficult as the cervix was flush with the vagina and the upper vagina was narrowed.  The patient was interested in a definitive hysterectomy procedure however she required an excisional procedure to rule out invasive carcinoma before having this.  On 02/18/18 she  underwent cold knife conization of the cervix in the operating room.  Inspection of the cervix with acetic acid application was grossly unremarkable with no apparent lesions seen.  The upper vagina was also grossly normal.  The cervix was small, fairly flush with the surrounding vagina, however the conization procedure was relatively straightforward.  A 0.9 cm AP dimension by 1.5 cm deep dimension cone was taken without difficulty.  There is minimal bleeding in the operating room. Final pathology revealed no residual dysplasia.  On postoperative examination a friable lesion was seen on the posterior upper vaginal wall measuring approximately 2cm. It was separate to, but abutting, the posterior lip of the cervix. 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   The patient was recommended surgical excision of the lesion, and due to its proximity with the cervix, was recommended hysterectomy (without oophorectomy) at the time of surgery in order to establish adequate margins.  The patient had her surgery scheduled (robotic hysterectomy, upper vaginectomy), however canceled this after she discussed the proposed surgery with her referring doctor, Dr Ronita Hipps, who had concerns about the surgical plan. Dr Ronita Hipps requested that Kelly Mcclure see my partner, Dr Fermin Schwab for a second opinion.   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  Given his shared concern that this lesion may harbor occult invasive disease, and due to its proximity to the cervix, he agreed with the surgical plan of hysterectomy (not oophorectomy) with salpingectomy and upper vaginectomy.   On June 01, 2018 the patient underwent a robotic assisted total hysterectomy with upper vaginectomy  bilateral salpingectomy and due for pexy.  The surgical  procedure was unremarkable and uncomplicated.  Operative findings were significant for a grossly normal uterus and cervix and tubes and ovaries.  A 3 x 3 cm discoid raised erythematous lesion was visualized vaginally on the posterior upper wall of the vagina with close proximity to the cervix (approximately 7 mm margin to the cervicovaginal junction.  At the completion of the procedure there was no visible remaining lesion in the vagina.  Via intraperitoneal inspection there was no eruption of the tumor through into the intraperitoneal space.  The rectovaginal septum was created with a ease and there was no gross involvement of the anterior rectal wall during surgery.  Inspection of the specimen revealed the margins to be grossly clear of disease.  At the completion of the procedure the ovaries were pexed to the pelvic brim.  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  The carcinoma involved the cauterized deep resection margin representing the rectovaginal septal margin.  Lymphovascular space invasion was present.  The cervix and uterus and fallopian tubes are grossly unremarkable.  Postop PET showed a PET avid Rt pelvic lymph node consistent with metastatic disease (stage IIIC).   She was treated with radiosensitizing weekly CDDP with whole pelvic RT and vaginal brachytherapy (the pelvis received 25 fractions of 1.8 Gy for a total of 45 Gray, there was a pelvic nodal boost of 8 fractions of 1.8 Gray for a total of 14.4 Gy, cumulative dose to involve nodes of 59.4 Gy; the vagina received 6 Gy in 4 fractions for total dose of 24 Gray).  Treatment dates 07-14-18 to 09/27/18.   Repeat PET imaging 3 months post treatment was performed on Dec 28, 2018 and revealed response to therapy of the right pelvic nodal metastases with no increased uptake in the nodal basins and no lymphadenopathy.  There is no evidence of  residual recurrent disease.  There was similar findings to the bilateral pulmonary nodules which appeared benign and stable from prior imaging.  She tolerated treatment well with no major toxicities.   Interval History:  The patient subsequently developed symptoms of early menopause with hot flashes and vaginal dryness and atrophy.  She has significant difficulty using the vaginal dilator due to vaginal narrowing and shortening. FSH was drawn and was elevated suggesting premature menopause secondary to radiation to the ovaries.  She was prescribed Vivelle-Dot estrogen replacement therapy.   CT abd/pelvis in May, 2021 showed no evidence of recurrence and no lymphadenopathy.  Pap in December, 2021 was negative with negative high risk HPV.  She has no symptoms concerning for recurrence.   Review of Systems:10 point review of systems is negative except as noted in interval history.   Vitals: Blood pressure (!) 117/94, pulse 100, temperature 98.1 F (36.7 C), temperature source Oral, resp. rate 18, height '5\' 5"'  (1.651 m), weight 156 lb (70.8 kg), last menstrual period 05/17/2018, SpO2 98 %.  Physical Exam: General : The patient is a healthy woman in no acute distress.  HEENT: normocephalic, extraoccular movements normal; neck is supple without thyromegally  Lynphnodes: Supraclavicular and inguinal nodes not enlarged  Abdomen: Soft, non-tender, no ascites, no organomegally, no masses, no hernias. Soft Pelvic:  Vaginal cuff with no lesions, upper vaginal narrowing. Bleeds easily with speculum opening. Smooth vaginal walls, no palpable masses or visible lesions. Pap taken.  Discomfort with exam.    Lower extremities:  No edema or varicosities. Normal range of motion   Allergies  Allergen Reactions   Sulfa Antibiotics Swelling    Tongue swelling, throat irritated   Adhesive [Tape] Other (See Comments)    "skin burn"   Other Swelling and Other (See Comments)    Magic mouth wash/ causes  swelling of the tongue   Relpax [Eletriptan Hydrobromide] Other (See Comments)    Stroke-like symptoms   Topiramate Other (See Comments)   Ultram [Tramadol] Other (See Comments)    2004--  Per pt had seizure due to interaction with other medication she was taking at the time    Past Medical History:  Diagnosis Date   Cancer ([place]) DX 2011   CERVICAL    Cough    WITH SEASONAL ALLEGIES, NONPRODUCTIVE LAST 2 DAYS   H/O radioactive iodine thyroid ablation 12/2009   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 Start laparoscopically HOLD OPEN ABDOMINAL ITEMS    CERCLAGE LAPAROSCOPIC ABDOMINAL N/A 11/19/2016   Procedure: CERCLAGE LAPAROSCOPIC ABDOMINAL;  Surgeon: Governor Specking, MD;  Location: [facility];  Service: Gynecology;  Laterality: N/A;   CERVICAL CONIZATION W/BX N/A 02/18/2018   Procedure: COLD KNIFE CONIZATION OF CERVIX WITH POSSIBLE BIOPSY;  Surgeon: [name], MD;  Location: [place];  Service: Gynecology;  Laterality: N/A;   CESAREAN  SECTION N/A 05/27/2017   Procedure: Primary CESAREAN SECTION;  Surgeon: Brien Few, MD;  Location: [place];  Service: Obstetrics;  Laterality: N/A;  EDD: 11/4/18Allergy: Adhesive, Relpax, Sulfa, Ultram   COLD KNIFE CONIZATION  03-29-2010   dr Ronita Hipps  [facility]   DILATION AND CURETTAGE OF UTERUS  2009   DILATION AND CURETTAGE OF UTERUS N/A 03/01/2013   Procedure: CERVICAL DILATATION AND ENDOCERVICAL CURRETAGE AND VAAGINAL BIOPIES;  Surgeon: Alvino Chapel, MD;  Location: [facility];  Service: Gynecology;  Laterality: N/A;   EXTRACORPOREAL SHOCK WAVE LITHOTRIPSY Right 05/10/2020   Procedure: EXTRACORPOREAL SHOCK WAVE LITHOTRIPSY (ESWL);  Surgeon: Robley Fries, MD;  Location: [facility];  Service: Urology;  Laterality: Right;   IR IMAGING GUIDED PORT INSERTION  07/12/2018   IR REMOVAL TUN ACCESS W/ PORT W/O FL MOD SED  01/14/2019   LEEP  02/25/2010;  12/ 2012  dr Ronita Hipps office   NASAL SEPTUM SURGERY  2006   Repair of deviated septum   SIGMOIDOSCOPY  2009   TONSILLECTOMY AND ADENOIDECTOMY  1995    Current Outpatient Medications  Medication Sig Dispense Refill   ALPRAZolam (  XANAX) 0.5 MG tablet Take 0.5 mg by mouth 2 (two) times daily as needed for anxiety.     estradiol (ESTRACE) 1 MG tablet Take 1 tablet (1 mg total) by mouth daily. 90 tablet 6   LamoTRIgine 200 MG TB24 24 hour tablet Take 2 tablets (400 mg total) by mouth 2 (two) times daily. 360 tablet 4   levETIRAcetam (KEPPRA) 750 MG tablet Take 2 tablets (1,500 mg total) by mouth 2 (two) times daily. 360 tablet 4   loratadine (CLARITIN) 10 MG tablet Take 10 mg by mouth every morning.     naproxen (NAPROSYN) 500 MG tablet Take 1 tablet (500 mg total) by mouth 2 (two) times daily as needed. 30 tablet 0   ondansetron (ZOFRAN ODT) 4 MG disintegrating tablet Take 1 tablet (4 mg total) by mouth every 8 (eight) hours as needed for nausea or vomiting. 20 tablet 0   Rimegepant Sulfate (NURTEC) 75 MG TBDP Take by  mouth.     SYNTHROID 125 MCG tablet Take 1 tablet (125 mcg total) by mouth daily before breakfast. 90 tablet 3   No current facility-administered medications for this visit.    Social History   Socioeconomic History   Marital status: Single    Spouse name: Not on file   Number of children: 1   Years of education: Not on file   Highest education level: Not on file  Occupational History   Occupation: T[employer]  Tobacco Use   Smoking status: Never   Smokeless tobacco: Never  Vaping Use   Vaping Use: Never used  Substance and Sexual Activity   Alcohol use: Not Currently    Comment: SELDOM, NONE IN  10 MONTHS   Drug use: No   Sexual activity: Yes    Birth control/protection: Surgical  Other Topics Concern   Not on file  Social History Narrative   Not on file   Social Determinants of Health   Financial Resource Strain: Not on file  Food Insecurity: Not on file  Transportation Needs: Not on file  Physical Activity: Not on file  Stress: Not on file  Social Connections: Not on file  Intimate Partner Violence: Not on file    Family History  Problem Relation Age of Onset   Leukemia Other    Diabetes Other    Diabetes Mother    Skin cancer Mother    Hyperlipidemia Mother    Hypertension Father    Asthma Father    Skin cancer Maternal Aunt    Multiple myeloma Maternal Aunt    Testicular cancer Maternal Uncle    Hyperlipidemia Maternal Grandmother    AAA (abdominal aortic aneurysm) Maternal Grandmother    COPD Maternal Grandmother    Prostate cancer Maternal Grandfather    AAA (abdominal aortic aneurysm) Maternal Grandfather    Kidney failure Maternal Grandfather     Thereasa Solo, MD 03/07/2021, 10:34 AM

## 2021-03-11 LAB — CYTOLOGY - PAP
Comment: NEGATIVE
Diagnosis: REACTIVE
High risk HPV: NEGATIVE

## 2021-03-12 ENCOUNTER — Telehealth: Payer: Self-pay

## 2021-03-12 NOTE — Telephone Encounter (Signed)
Told Kelly Mcclure that the Pap smear is normal. Showing benign changes from radiation.  The HPV high risk is negative. Pt verbalized understanding.

## 2021-03-19 DIAGNOSIS — F438 Other reactions to severe stress: Secondary | ICD-10-CM | POA: Diagnosis not present

## 2021-03-22 ENCOUNTER — Other Ambulatory Visit: Payer: Self-pay | Admitting: Gynecologic Oncology

## 2021-03-22 ENCOUNTER — Encounter: Payer: Self-pay | Admitting: Gynecologic Oncology

## 2021-03-22 DIAGNOSIS — E2839 Other primary ovarian failure: Secondary | ICD-10-CM

## 2021-03-22 MED ORDER — ESTRADIOL 1 MG PO TABS: 1.0000 mg | ORAL_TABLET | Freq: Every day | ORAL | 3 refills | Status: DC

## 2021-03-22 MED ORDER — ESTRADIOL 0.5 MG PO TABS: 0.5000 mg | ORAL_TABLET | Freq: Every day | ORAL | 3 refills | Status: DC

## 2021-03-22 NOTE — Progress Notes (Signed)
See mychart message. Patient reporting increase in hot flashes and asking about increasing dose of estrogen. Currently taking 1 mg tablet once daily. Prescribed 0.5 mg tablet for patient to take with the 1 mg tablet for a total of 1.5 mg daily. She is advised to let us know how this works and if the dose needs to be increased to 2 mg.

## 2021-04-04 DIAGNOSIS — F438 Other reactions to severe stress: Secondary | ICD-10-CM | POA: Diagnosis not present

## 2021-04-04 DIAGNOSIS — F439 Reaction to severe stress, unspecified: Secondary | ICD-10-CM | POA: Diagnosis not present

## 2021-04-05 ENCOUNTER — Telehealth: Payer: Self-pay

## 2021-04-05 DIAGNOSIS — E039 Hypothyroidism, unspecified: Secondary | ICD-10-CM | POA: Diagnosis not present

## 2021-04-05 DIAGNOSIS — R799 Abnormal finding of blood chemistry, unspecified: Secondary | ICD-10-CM | POA: Diagnosis not present

## 2021-04-05 DIAGNOSIS — E785 Hyperlipidemia, unspecified: Secondary | ICD-10-CM | POA: Diagnosis not present

## 2021-04-05 NOTE — Telephone Encounter (Signed)
Spoke with Kelly Mcclure to let her know that [name], NP has called in a prescription for 0.'5mg'$  tablets of estradiol. She is to take her '1mg'$  tablet in addition to 0.'5mg'$  tablet for a total of 1.'5mg'$  of estradiol daily. Instructed patient to call if her symptoms are not improving. Patient verbalized understanding.

## 2021-04-10 DIAGNOSIS — L578 Other skin changes due to chronic exposure to nonionizing radiation: Secondary | ICD-10-CM | POA: Diagnosis not present

## 2021-04-10 DIAGNOSIS — L814 Other melanin hyperpigmentation: Secondary | ICD-10-CM | POA: Diagnosis not present

## 2021-04-10 DIAGNOSIS — D1801 Hemangioma of skin and subcutaneous tissue: Secondary | ICD-10-CM | POA: Diagnosis not present

## 2021-04-10 DIAGNOSIS — L918 Other hypertrophic disorders of the skin: Secondary | ICD-10-CM | POA: Diagnosis not present

## 2021-04-11 ENCOUNTER — Encounter: Payer: Self-pay | Admitting: Internal Medicine

## 2021-04-11 ENCOUNTER — Ambulatory Visit (INDEPENDENT_AMBULATORY_CARE_PROVIDER_SITE_OTHER): Payer: BC Managed Care – PPO | Admitting: Internal Medicine

## 2021-04-11 ENCOUNTER — Other Ambulatory Visit: Payer: Self-pay

## 2021-04-11 ENCOUNTER — Other Ambulatory Visit: Payer: Self-pay | Admitting: Internal Medicine

## 2021-04-11 ENCOUNTER — Other Ambulatory Visit: Payer: Self-pay | Admitting: Gynecologic Oncology

## 2021-04-11 VITALS — BP 120/92 | HR 87 | Ht 65.0 in | Wt 160.2 lb

## 2021-04-11 DIAGNOSIS — E890 Postprocedural hypothyroidism: Secondary | ICD-10-CM | POA: Diagnosis not present

## 2021-04-11 DIAGNOSIS — E039 Hypothyroidism, unspecified: Secondary | ICD-10-CM | POA: Diagnosis not present

## 2021-04-11 DIAGNOSIS — G40909 Epilepsy, unspecified, not intractable, without status epilepticus: Secondary | ICD-10-CM | POA: Diagnosis not present

## 2021-04-11 DIAGNOSIS — C539 Malignant neoplasm of cervix uteri, unspecified: Secondary | ICD-10-CM | POA: Diagnosis not present

## 2021-04-11 DIAGNOSIS — Z0000 Encounter for general adult medical examination without abnormal findings: Secondary | ICD-10-CM | POA: Diagnosis not present

## 2021-04-11 DIAGNOSIS — E2839 Other primary ovarian failure: Secondary | ICD-10-CM

## 2021-04-11 MED ORDER — SYNTHROID 112 MCG PO TABS: 112.0000 ug | ORAL_TABLET | Freq: Every day | ORAL | 3 refills | Status: DC

## 2021-04-11 NOTE — Patient Instructions (Addendum)
Please come back for labs in 5-6 weeks.  Please decrease Synthroid to 112 mcg daily.  Take the thyroid hormone every day, with water, at least 30 minutes before breakfast, separated by at least 4 hours from: - acid reflux medications - calcium - iron - multivitamins  Please come back for a follow-up appointment in 6 months.

## 2021-04-11 NOTE — Progress Notes (Signed)
Patient ID: Kelly Mcclure, female   DOB: 1981/08/04, 40 y.o.   MRN: [id number]   This visit occurred during the SARS-CoV-2 public health emergency.  Safety protocols were in place, including screening questions prior to the visit, additional usage of staff PPE, and extensive cleaning of exam room while observing appropriate contact time as indicated for disinfecting solutions.   HPI  Kelly Mcclure is a 40 y.o.-year-old very pleasant female, initially referred by her ObGyn Dr., Dr. 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 Last visit 1 year ago.  Interim history: She continues to have hot flashes-estrogen dose increased recently. He has a history of a kidney stone in 12/2020. Resolved. She missed doses in 12/2020 >> TSH increased. She is busy with her 33-year-old daughter. She has an occasional dry cough. On Claritin.  She also has chronic hair loss.  Otherwise, no complaints.  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 She was started on Synthroid soon after.  She had a thyroid uptake and scan >> normal uptake but again demonstrated hot nodule on scan >> RAI treatment in 12/2009 (Dr. Chalmers Cater), after which she developed hypothyroidism >> started on levothyroxine, but currently on d.a.w. Synthroid.    She gave birth in 2018.  During her pregnancy she was on 175 mcg Synthroid.  Afterwards, we decreased the dose gradually.  Pt is on Synthroid 125 mcg daily: - in am - fasting - coffee + creamer >45-60 min later - at least 30 min from b'fast - no Ca, Fe, MVI, PPIs - not on Biotin  Reviewed patient's TFTs: 04/05/2021: TSH 0.06, free T4 1.91 (0.82-1.77) 12/28/2020: TSH 27.5 Lab Results  Component Value Date   TSH 1.14 04/05/2020   TSH 0.15 (L) 04/14/2019   TSH 0.80 03/24/2018   TSH 5.78 (H) 03/20/2017   TSH 5.27 01/16/2017   FREET4 0.83 04/05/2020   FREET4 1.12 04/14/2019   FREET4 0.99  03/24/2018   FREET4 0.91 03/20/2017  Received labs from Dr. Ronita Hipps from 02/22/2019: -TSH 0.02, free T4 1.92, free T3 4.1 -HbA1c 5.2% -Lipids: 192/259/43/97 Received labs from [place] [facility], drawn on 12/07/2017: TSH 0.85, free T3 3.0, free T4 1.29, all normal 08/28/2017: TSH: 1.28, Free T4: 1.31 07/08/2017: TSH 0.19, free T3 3.2, free T4 1.42 - on levothyroxine 175 mcg daily >> decreased to 6 out of 7 days  04/30/2017  TSH 1.4, free T4 1.48, free T3 3.1 02/19/2017. These were perfect: TSH 1.7, free T4 1.57 (0.82-1.77), free t3 3.4 (2-4.4).  12/10/2016: TSH 0.54 (0.4-4), free T3 3.3 (2-4.4), free T4 1.79 (0.82-1.77) - on LT4 175 mcg 11/11/2016: TSH 0.58 (0.4-4), free T3 3.3 (2-4.4), free T4 1.79 (0.82-1.77) - on LT4 175 mcg 10/2016: TSH 14 - on LT4 137 mcg    Pt denies: - feeling nodules in neck - hoarseness - dysphagia - choking - SOB with lying down  She has + FH of thyroid disorders in: great aunt. + poss FH of thyroid cancer. No FH of thyroid cancer. No h/o radiation tx to head or neck. No herbal supplements. No Biotin use. No recent steroids use.   She has hair loss at last visit and I recommended B complex in the past >> did not start.  She has a history of vaginal cancer diagnosed in 2019 >> she had conization and cerclage. She had TAH (no BSO) + ChTx and RxTx. She finished 10/2018.  She developed ovarian insufficiency afterwards.  She was recommended to start Vivelle dot >> rash >> now on po Estradiol.  She has a history of absence seizures.  On Lamictal and Keppra.   She had HAs >> MUCH improved on qod Nurtec (preventative measure).  She gave birth in 05/2017 (cesarean section).  ROS: Constitutional: no weight gain/no weight loss, no fatigue, improved subjective hyperthermia, no subjective hypothermia Eyes: no blurry vision, no xerophthalmia ENT: no sore throat, + see HPI Cardiovascular: no CP/no SOB/no palpitations/no leg swelling Respiratory: no cough/no SOB/no  wheezing Gastrointestinal: no N/no V/no D/no C/no acid reflux Musculoskeletal: no muscle aches/no joint aches Skin: no rashes, + hair loss - chronic Neurological: no tremors/no numbness/no tingling/no dizziness  I reviewed pt's medications, allergies, PMH, social hx, family hx, and changes were documented in the history of present illness. Otherwise, unchanged from my initial visit note.  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 Start laparoscopically HOLD OPEN ABDOMINAL ITEMS    CERCLAGE LAPAROSCOPIC ABDOMINAL N/A 11/19/2016   Procedure: CERCLAGE LAPAROSCOPIC ABDOMINAL;  Surgeon: Governor Specking, MD;  Location: [facility];  Service: Gynecology;  Laterality: N/A;   CERVICAL CONIZATION W/BX N/A 02/18/2018   Procedure: COLD KNIFE CONIZATION OF CERVIX WITH POSSIBLE BIOPSY;  Surgeon:  [name], MD;  Location: [facility];  Service: Gynecology;  Laterality: N/A;   CESAREAN SECTION N/A 05/27/2017   Procedure: Primary CESAREAN SECTION;  Surgeon: Brien Few, MD;  Location: [facility];  Service: Obstetrics;  Laterality: N/A;  EDD: 11/4/18Allergy: Adhesive, Relpax, Sulfa, Ultram   COLD KNIFE CONIZATION  03-29-2010   dr Ronita Hipps  [facility]   DILATION AND CURETTAGE OF UTERUS  2009   DILATION AND CURETTAGE OF UTERUS N/A 03/01/2013   Procedure: CERVICAL DILATATION AND ENDOCERVICAL CURRETAGE AND VAAGINAL BIOPIES;  Surgeon: Alvino Chapel, MD;  Location: [facility];  Service: Gynecology;  Laterality: N/A;   EXTRACORPOREAL SHOCK WAVE LITHOTRIPSY Right 05/10/2020   Procedure: EXTRACORPOREAL SHOCK WAVE LITHOTRIPSY (ESWL);  Surgeon: Robley Fries, MD;  Location: [facility];  Service: Urology;  Laterality: Right;   IR IMAGING GUIDED PORT INSERTION  07/12/2018   IR REMOVAL TUN ACCESS W/ PORT W/O FL MOD SED  01/14/2019   LEEP  02/25/2010;  12/ 2012  dr Ronita Hipps office  NASAL SEPTUM SURGERY  2006   Repair of deviated septum   SIGMOIDOSCOPY  2009   TONSILLECTOMY AND ADENOIDECTOMY  1995   Social History   Social History   Marital status: Engaged    Spouse name: N/A   Number of children: 0   Occupational History   Surveyor, quantity at [employer] History Main Topics   Smoking status: Never Smoker   Smokeless tobacco: Never Used   Alcohol use Yes     Comment: Rare   Drug use: No   Current Outpatient Medications on File Prior to Visit  Medication Sig Dispense Refill   ALPRAZolam (XANAX) 0.5 MG tablet Take 0.5 mg by mouth 2 (two) times daily as needed for anxiety.     estradiol (ESTRACE) 0.5 MG tablet Take 1 tablet (0.5 mg total) by mouth daily. Take with 1 mg tablet to equal 1.5 mg once daily 90 tablet 3   estradiol (ESTRACE) 1 MG tablet Take 1 tablet (1 mg total) by mouth daily. Take with 0.5 mg tablet to equal 1.5  mg once daily 90 tablet 3   LamoTRIgine 200 MG TB24 24 hour tablet Take 2 tablets (400 mg total) by mouth 2 (two) times daily. 360 tablet 4   levETIRAcetam (KEPPRA) 750 MG tablet Take 2 tablets (1,500 mg total) by mouth 2 (two) times daily. 360 tablet 4   loratadine (CLARITIN) 10 MG tablet Take 10 mg by mouth every morning.     naproxen (NAPROSYN) 500 MG tablet Take 1 tablet (500 mg total) by mouth 2 (two) times daily as needed. 30 tablet 0   ondansetron (ZOFRAN ODT) 4 MG disintegrating tablet Take 1 tablet (4 mg total) by mouth every 8 (eight) hours as needed for nausea or vomiting. 20 tablet 0   Rimegepant Sulfate (NURTEC) 75 MG TBDP Take by mouth.     SYNTHROID 125 MCG tablet Take 1 tablet (125 mcg total) by mouth daily before breakfast. 90 tablet 3   No current facility-administered medications on file prior to visit.   Allergies  Allergen Reactions   Sulfa Antibiotics Swelling    Tongue swelling, throat irritated   Adhesive [Tape] Other (See Comments)    "skin burn"   Other Swelling and Other (See Comments)    Magic mouth wash/ causes swelling of the tongue   Relpax [Eletriptan Hydrobromide] Other (See Comments)    Stroke-like symptoms   Topiramate Other (See Comments)   Ultram [Tramadol] Other (See Comments)    2004--  Per pt had seizure due to interaction with other medication she was taking at the time   Family History  Problem Relation Age of Onset   Leukemia Other    Diabetes Other    Diabetes Mother    Skin cancer Mother    Hyperlipidemia Mother    Hypertension Father    Asthma Father    Skin cancer Maternal Aunt    Multiple myeloma Maternal Aunt    Testicular cancer Maternal Uncle    Hyperlipidemia Maternal Grandmother    AAA (abdominal aortic aneurysm) Maternal Grandmother    COPD Maternal Grandmother    Prostate cancer Maternal Grandfather    AAA (abdominal aortic aneurysm) Maternal Grandfather    Kidney failure Maternal Grandfather    PE: BP (!) 120/92 (BP  Location: Right Arm, Patient Position: Sitting, Cuff Size: Normal)   Pulse 87   Ht '5\' 5"'  (1.651 m)   Wt 160 lb 3.2 oz (72.7 kg)   LMP 05/17/2018 (  Exact Date)   SpO2 96%   BMI 26.66 kg/m  Wt Readings from Last 3 Encounters:  04/11/21 160 lb 3.2 oz (72.7 kg)  03/07/21 156 lb (70.8 kg)  12/30/20 160 lb 4.4 oz (72.7 kg)   Constitutional: normal weight, in NAD Eyes: PERRLA, EOMI, no exophthalmos ENT: moist mucous membranes, no thyromegaly, no cervical lymphadenopathy Cardiovascular: RRR, No MRG Respiratory: CTA B Gastrointestinal: abdomen soft, NT, ND, BS+ Musculoskeletal: no deformities, strength intact in all 4 Skin: moist, warm, no rashes Neurological: no tremor with outstretched hands, DTR normal in all 4  ASSESSMENT: 1. Postablative Hypothyroidism - uncontrolled  PLAN:  1. Patient with longstanding postablative hypothyroidism, on Synthroid d.a.w. -At last visit, she was on 125 mcg Synthroid daily and on this dose, her thyroid tests were all normal. -However I recently received labs from Dr. Ronita Hipps, checked 6 days ago.  TSH was suppressed: 0.06.  Of note, previous TSH obtained 4 months ago was very high, at 27.5 at previous check by Dr. Ronita Hipps.  At that time, she mentions that she missed Synthroid doses.  Since then, she started to take them correctly. - she continues on LT4 DAW 125 mcg daily, but based on the labs checked several days ago, will need to decrease the dose to 112 mcg daily - pt feels good on this dose.  At last visit she still had hot flashes, most likely reactive related to POI for which she is now on estrogen.  Her hot flashes improved after increasing the dose of estrogen to 3 weeks ago.  She also had chronic hair loss.  She did not start taking B vitamins. - we discussed about taking the thyroid hormone every day, with water, >30 minutes before breakfast, separated by >4 hours from acid reflux medications, calcium, iron, multivitamins. Pt. is taking it correctly.   She is currently not missing doses. - will check thyroid tests in 1.5 months after the above dose change: TSH and fT4 -I will see her back in 6 months, but most likely sooner for labs.  Orders Placed This Encounter  Procedures   TSH   T4, free   Philemon Kingdom, MD PhD [facility]

## 2021-04-15 DIAGNOSIS — F439 Reaction to severe stress, unspecified: Secondary | ICD-10-CM | POA: Diagnosis not present

## 2021-04-25 DIAGNOSIS — F439 Reaction to severe stress, unspecified: Secondary | ICD-10-CM | POA: Diagnosis not present

## 2021-05-06 DIAGNOSIS — F439 Reaction to severe stress, unspecified: Secondary | ICD-10-CM | POA: Diagnosis not present

## 2021-05-14 DIAGNOSIS — F439 Reaction to severe stress, unspecified: Secondary | ICD-10-CM | POA: Diagnosis not present

## 2021-05-15 DIAGNOSIS — Z23 Encounter for immunization: Secondary | ICD-10-CM | POA: Diagnosis not present

## 2021-05-15 DIAGNOSIS — L719 Rosacea, unspecified: Secondary | ICD-10-CM | POA: Diagnosis not present

## 2021-06-04 DIAGNOSIS — F439 Reaction to severe stress, unspecified: Secondary | ICD-10-CM | POA: Diagnosis not present

## 2021-06-19 DIAGNOSIS — F439 Reaction to severe stress, unspecified: Secondary | ICD-10-CM | POA: Diagnosis not present

## 2021-07-05 ENCOUNTER — Telehealth: Payer: Self-pay | Admitting: *Deleted

## 2021-07-05 NOTE — Telephone Encounter (Signed)
CALLED PATIENT TO INFORM THAT DR. KINARD WILL BE IN THE OR ON 07-11-21, RESCHEDULED FU APPT. FOR 08-01-21 @ 8:15 AM, LVM FOR A RETURN CALL

## 2021-07-11 ENCOUNTER — Ambulatory Visit: Payer: Self-pay | Admitting: Radiation Oncology

## 2021-07-31 NOTE — Progress Notes (Signed)
Radiation Oncology         [phone] ________________________________  Name: Kelly Mcclure MRN: [id number]  Date: 08/01/2021  DOB: 05/17/1981  Follow-Up Visit Note  CC: Brien Few, MD  [name], MD    ICD-10-CM   1. Vaginal cancer ([facility])  C52     2. 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 07/14/2018 - 08/19/2018 / Pelvis; 25 fractions of 1.8 Gy for a total of 45 Gy 2. 08/20/2018 - 08/31/2018 / Pelvis (nodal boost) ; 8 fractions of 1.8 Gy for a total of 14.4 Gy (59.4 Gy cumulative to involved nodes) 3. 09/06/2018, 09/15/2018, 09/22/2018, 09/27/2018 / Vagina; 6 Gy in 4 fractions for a total dose of 24 Gy (brachytherapy treatment)  Narrative:  The patient returns today for routine follow-up, she was last seen here for follow up on 10/04/20.   Since then, the patient presented to the ED on 12/30/20 with concern for flank pain.  The patient reported sudden onset of right-sided flank pain earlier that day which radiated from her right side to right groin region.  She defined the pain as 10/10 sharp, stabbing, and constant (though would increase in severity in waves at times).  CT renal stone study performed during ED course showed mild right hydronephrosis and a stone measuring 4 mm in the posterior bladder. Subsequently, her presenting symptoms were attributed to a recently passed stone or impending passage.  On reassessment, the patient's symptoms were well controlled with pain medication and antiemetics, and she was discharged home that same day in stable condition.  The patient followed up with Dr. Denman George on 03/07/21. During which time, the patient was noted as NED on examination. However, the patient was also noted to have developed symptoms of  early menopause defined by hot flashes, vaginal dryness, and atrophy. She also reported significant difficulty using the vaginal dilator due to vaginal narrowing and shortening. FSH was drawn and was elevated suggesting premature menopause secondary to radiation to the ovaries.  Accordingly, she was prescribed Vivelle-Dot estrogen replacement therapy.    She denies any abdominal bloating or pelvic pain.  She denies any hematuria or rectal bleeding.  She denies any nausea or persistent problems with diarrhea.  She continues to be inconsistent with using her vaginal dilator and I discussed the importance of this particularly in light of her young age.  She did notice an episode of vaginal spotting earlier this month and is a little concerned in light of this issue.                               Allergies:  is allergic to sulfa antibiotics, adhesive [tape], other, relpax [eletriptan hydrobromide], topiramate, and ultram [tramadol].  Meds: Current Outpatient Medications  Medication Sig Dispense Refill   ALPRAZolam (XANAX) 0.5 MG tablet Take 0.5 mg by mouth 2 (two) times daily as needed for anxiety.     estradiol (ESTRACE) 1 MG tablet Take 1 tablet (1 mg total) by mouth daily. Take with 0.5 mg tablet to equal 1.5 mg once daily 90 tablet 3   LamoTRIgine 200 MG TB24 24 hour tablet Take 2 tablets (400 mg total) by mouth 2 (two) times daily. 360 tablet 4  levETIRAcetam (KEPPRA) 750 MG tablet Take 2 tablets (1,500 mg total) by mouth 2 (two) times daily. 360 tablet 4   levocetirizine (XYZAL ALLERGY 24HR) 5 MG tablet Take 5 mg by mouth every evening.     Omega 3 1200 MG CAPS Take by mouth.     Red Yeast Rice Extract (RED YEAST RICE PO) Take by mouth.     SYNTHROID 112 MCG tablet Take 1 tablet (112 mcg total) by mouth daily before breakfast. 45 tablet 3   estradiol (ESTRACE) 0.5 MG tablet Take 1 tablet (0.5 mg total) by mouth daily. Take with 1 mg tablet to equal 1.5 mg once daily (Patient not taking: Reported  on 08/01/2021) 90 tablet 3   loratadine (CLARITIN) 10 MG tablet Take 10 mg by mouth every morning.     Rimegepant Sulfate (NURTEC) 75 MG TBDP Take by mouth. (Patient not taking: Reported on 08/01/2021)     No current facility-administered medications for this encounter.    Physical Findings: The patient is in no acute distress. Patient is alert and oriented.  height is 5\' 6"  (1.676 m) and weight is 161 lb 6.4 oz (73.2 kg). Her temperature is 97.6 F (36.4 C). Her blood pressure is 110/76 and her pulse is 82. Her respiration is 16 and oxygen saturation is 98%. .  No significant changes. Lungs are clear to auscultation bilaterally. Heart has regular rate and rhythm. No palpable cervical, supraclavicular, or axillary adenopathy. Abdomen soft, non-tender, normal bowel sounds.  On pelvic examination the external genitalia unremarkable.  A small  speculum was used for the examination.  There were no mucosal lesions noted in the vaginal vault however the vaginal mucosa in the proximal vagina tends to bleed easily with manipulation.  On bimanual and rectovaginal examination there are no pelvic masses appreciated.   Lab Findings: Lab Results  Component Value Date   WBC 10.2 12/30/2020   HGB 13.8 12/30/2020   HCT 43.0 12/30/2020   MCV 82.5 12/30/2020   PLT 255 12/30/2020    Radiographic Findings: No results found.  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  Early menopause related to her pelvic radiation therapy and the necessity to cover the ovaries with her node positive situation.  Unsure if vaginal estrogen would be of benefit to the vaginal mucosa since she is on systemic replacement but will inquire with gynecologic oncology.  Plan: She will follow up with Dr. Berline Lopes in 6 months.  Radiation oncology in 1 year.  She also continues to follow-up with Dr. Ronita Hipps her  gynecologist.   25 minutes of total time was spent for this patient encounter, including preparation, face-to-face counseling with the patient and coordination of care, physical exam, and documentation of the encounter. ____________________________________  Blair Promise, PhD, MD   This document serves as a record of services personally performed by Gery Pray, MD. It was created on his behalf by Roney Mans, a trained medical scribe. The creation of this record is based on the scribe's personal observations and the provider's statements to them. This document has been checked and approved by the attending provider.

## 2021-08-01 ENCOUNTER — Ambulatory Visit
Admission: RE | Admit: 2021-08-01 | Discharge: 2021-08-01 | Disposition: A | Discharge status: Home / Self Care | Payer: BC Managed Care – PPO | Source: Ambulatory Visit | Attending: Radiation Oncology | Admitting: Radiation Oncology

## 2021-08-01 ENCOUNTER — Other Ambulatory Visit: Payer: Self-pay

## 2021-08-01 ENCOUNTER — Encounter: Payer: Self-pay | Admitting: Radiation Oncology

## 2021-08-01 VITALS — BP 110/76 | HR 82 | Temp 97.6°F | Resp 16 | Ht 66.0 in | Wt 161.4 lb

## 2021-08-01 DIAGNOSIS — N132 Hydronephrosis with renal and ureteral calculous obstruction: Secondary | ICD-10-CM | POA: Insufficient documentation

## 2021-08-01 DIAGNOSIS — C775 Secondary and unspecified malignant neoplasm of intrapelvic lymph nodes: Secondary | ICD-10-CM

## 2021-08-01 DIAGNOSIS — Z79899 Other long term (current) drug therapy: Secondary | ICD-10-CM | POA: Diagnosis not present

## 2021-08-01 DIAGNOSIS — C52 Malignant neoplasm of vagina: Secondary | ICD-10-CM

## 2021-08-01 DIAGNOSIS — Z8589 Personal history of malignant neoplasm of other organs and systems: Secondary | ICD-10-CM | POA: Diagnosis present

## 2021-08-01 DIAGNOSIS — Z923 Personal history of irradiation: Secondary | ICD-10-CM | POA: Diagnosis not present

## 2021-08-01 NOTE — Progress Notes (Signed)
Kelly Mcclure is here today for follow up post radiation to the pelvic.  They completed their radiation on: 09/27/18  Does the patient complain of any of the following:  Pain: No Abdominal bloating: Patient reports having bloating at times.  Diarrhea/Constipation: no Nausea/Vomiting: no Vaginal Discharge: Patient reports having some vaginal bleeding.  Blood in Urine or Stool: no Urinary Issues (dysuria/incomplete emptying/ incontinence/ increased frequency/urgency): no Does patient report using vaginal dilator 2-3 times a week and/or sexually active 2-3 weeks: No  Post radiation skin changes: no   Additional comments if applicable: Vitals:   81/85/63 0807  BP: 110/76  Pulse: 82  Resp: 16  Temp: 97.6 F (36.4 C)  SpO2: 98%  Weight: 161 lb 6.4 oz (73.2 kg)  Height: 5\' 6"  (1.676 m)

## 2021-08-02 DIAGNOSIS — E782 Mixed hyperlipidemia: Secondary | ICD-10-CM | POA: Diagnosis not present

## 2021-08-19 ENCOUNTER — Telehealth: Payer: Self-pay

## 2021-08-19 NOTE — Telephone Encounter (Signed)
Received call from Ms. Kelly Mcclure. Patient reports she has been having on and off vaginal bleeding. Over the weekend bleeding was heavier, she passed a clot and had mild discomfort in her lower abdomen. Bleeding has now stopped. Kelly John, NP notified. Scheduled patient appointment to see Dr. Berline Lopes on 08/23/21 at 0900. Patient is agreeable to appointment date and time. Advised patient to call with any needs. Patient verbalized understanding.

## 2021-08-23 ENCOUNTER — Inpatient Hospital Stay: Payer: 59

## 2021-08-23 ENCOUNTER — Telehealth: Payer: Self-pay

## 2021-08-23 ENCOUNTER — Other Ambulatory Visit: Payer: Self-pay

## 2021-08-23 ENCOUNTER — Inpatient Hospital Stay: Payer: 59 | Attending: Gynecologic Oncology | Admitting: Gynecologic Oncology

## 2021-08-23 VITALS — BP 132/83 | HR 86 | Temp 97.6°F | Resp 16 | Ht 66.0 in | Wt 158.7 lb

## 2021-08-23 DIAGNOSIS — R102 Pelvic and perineal pain: Secondary | ICD-10-CM | POA: Insufficient documentation

## 2021-08-23 DIAGNOSIS — Z79890 Hormone replacement therapy (postmenopausal): Secondary | ICD-10-CM | POA: Diagnosis not present

## 2021-08-23 DIAGNOSIS — R197 Diarrhea, unspecified: Secondary | ICD-10-CM | POA: Insufficient documentation

## 2021-08-23 DIAGNOSIS — C52 Malignant neoplasm of vagina: Secondary | ICD-10-CM

## 2021-08-23 DIAGNOSIS — Z87410 Personal history of cervical dysplasia: Secondary | ICD-10-CM | POA: Diagnosis not present

## 2021-08-23 DIAGNOSIS — R140 Abdominal distension (gaseous): Secondary | ICD-10-CM | POA: Insufficient documentation

## 2021-08-23 DIAGNOSIS — Z9079 Acquired absence of other genital organ(s): Secondary | ICD-10-CM | POA: Diagnosis not present

## 2021-08-23 DIAGNOSIS — Z9221 Personal history of antineoplastic chemotherapy: Secondary | ICD-10-CM | POA: Diagnosis not present

## 2021-08-23 DIAGNOSIS — R350 Frequency of micturition: Secondary | ICD-10-CM

## 2021-08-23 DIAGNOSIS — Z923 Personal history of irradiation: Secondary | ICD-10-CM | POA: Insufficient documentation

## 2021-08-23 DIAGNOSIS — Z90710 Acquired absence of both cervix and uterus: Secondary | ICD-10-CM | POA: Insufficient documentation

## 2021-08-23 DIAGNOSIS — E8940 Asymptomatic postprocedural ovarian failure: Secondary | ICD-10-CM | POA: Insufficient documentation

## 2021-08-23 DIAGNOSIS — N939 Abnormal uterine and vaginal bleeding, unspecified: Secondary | ICD-10-CM

## 2021-08-23 DIAGNOSIS — Z8544 Personal history of malignant neoplasm of other female genital organs: Secondary | ICD-10-CM

## 2021-08-23 DIAGNOSIS — Z90722 Acquired absence of ovaries, bilateral: Secondary | ICD-10-CM | POA: Insufficient documentation

## 2021-08-23 LAB — URINALYSIS, COMPLETE (UACMP) WITH MICROSCOPIC
Bacteria, UA: NONE SEEN
Bilirubin Urine: NEGATIVE
Glucose, UA: NEGATIVE mg/dL
Ketones, ur: 5 mg/dL — AB
Leukocytes,Ua: NEGATIVE
Nitrite: NEGATIVE
Protein, ur: NEGATIVE mg/dL
RBC / HPF: >50 RBC/hpf — ABNORMAL HIGH (ref 0–5)
Specific Gravity, Urine: 1.023 (ref 1.005–1.030)
pH: 5 (ref 5.0–8.0)

## 2021-08-23 NOTE — Patient Instructions (Signed)
It was a pleasure meeting you today.  I am not seeing findings or feeling anything concerning for cancer recurrence.  I will let you know when I get the biopsy back from today.  The tissue in the vagina looks fragile, and this is what is causing your bleeding.  Today we discussed trying either natural lubricant, such as coconut oil, olive oil, or apricot oil.  If you would like to do this for 2 to 3 weeks, please call me if you do not see an improvement.  The neck step would be trying some vaginal estrogen.  If symptoms continue despite treatment and negative biopsy, we will get a CT scan to evaluate further.

## 2021-08-23 NOTE — Telephone Encounter (Signed)
Spoke with [place] this afternoon and reviewed urinalysis results. Per [name], NP blood was seen on urinalysis but no bacteria. Urine has been sent for culture. Advised patient that our office will notify her of culture results when they are received. Patient verbalized understanding.

## 2021-08-23 NOTE — Progress Notes (Signed)
Gynecologic Oncology Return Clinic Visit  08/23/21  Reason for Visit: vaginal bleeding  Treatment History: Oncology History  Vaginal cancer ([place])  03/01/2013 Surgery   OPERATIVE REPORT   PREOPERATIVE DIAGNOSIS: High-grade dysplasia on Pap smear   POSTOPERATIVE DIAGNOSIS: Same   PROCEDURE: Exam under anesthesia, endocervical curettage, vaginal biopsies   SURGEON: Marti Sleigh, M.D  SURGICAL FINDINGS: Examination under anesthesia revealed a cervix which was flush with the vaginal fornices. The cervical canal was stenotic but once entered and dilated the uterus sounded approximate 7 cm anteriorly. Apply Lugol solution to the cervix showed no lesions. There were 2 areas of non-uptake of Lugol's in the posterior vagina which were biopsied   11/09/2016 Surgery   Preoperative diagnosis: Short cervix S/P cervical conization for microinvasive cervical cancer, intrauterine pregnancy at 11 weeks   Postoperative diagnosis: Short cervix S/P cervical conization for microinvasive cervical cancer, intrauterine pregnancy at 11 weeks   Procedure: Laparoscopy, transabdominal cervical isthmic cerclage x 2, intraoperative ultrasound guidance   Anesthesia: Gen. Endotracheal   Surgeon:Kelly Kerin Perna, MD Findings: On exam under anesthesia the cervix was closed and deviated to the left. On laparoscopy the liver, gallbladder, and appendix were normal.  The uterus was gravid and retroverted and retroflexed, both tubes and ovaries appeared normal.   Intraoperative ultrasound transabdominally and transvaginally showed a singleton intrauterine pregnancy at length consistent with 11 weeks. There was fetal cardiac activity and movements.     01/19/2018 Pathology Results   Cervical biopsy showed high grade squamous intraepithelial with severe dysplasia   02/09/2018 Pathology Results   Cervix, biopsy - SUPERFICIAL FRAGMENTS OF CERVICAL MUCOSA SHOWING AT LEAST HIGH GRADE SQUAMOUS INTRAEPITHELIAL LESION  (HSIL, CIN-III). - DEFINITIVE INVASION IS NOT SEEN BUT CANNOT BE ENTIRELY RULED OUT.   02/18/2018 Pathology Results   1. Cervix, cone - BENIGN SQUAMOUS AND ENDOCERVICAL MUCOSA, SEE COMMENT. - NO DYSPLASIA OR MALIGNANCY. 2. Endocervix, curettage - BLOOD. - NO MUCOSA PRESENT. Microscopic Comment 1. p16 is negative in the squamous epithelium   02/18/2018 Surgery   Preop Diagnosis: CIN III   Postoperative Diagnosis: same   Surgery: cold knife conization of cervix    Surgeons:  Donaciano Eva, MD Pathology: cervical cone with marking stitch at 12 o'clock, post cone ECC   Operative findings: no grossly visible exophytic lesions on ectocervix. Cervix flush with vagina.     03/11/2018 Pathology Results   Vagina, biopsy, posterior upper vaginal wall - HIGH GRADE SQUAMOUS DYSPLASIA, SEE COMMENT. Microscopic Comment There are detached squamous fragments with high grade dysplastic features. Several of the fragments have a minimal amount of attached stroma which is inflamed and focally fibrotic. While an invasive component cannot be ruled out, the limited stroma present hampers evaluate for invasion   04/27/2018 Pathology Results   Vagina, biopsy, posterior - SUPERFICIAL FRAGMENTS OF EXTENSIVE SQUAMOUS CELL CARCINOMA IN SITU. - SEE NOTE. Diagnosis Note Though focally suspicious, definitive evidence of invasive carcinoma is not seen in the submitted biopsies. The submitted fragments are superficial in nature and a deeper, more severe process cannot be ruled out. Dr Tresa Moore has reviewed this case and concurs with the above interpretation.   06/01/2018 Pathology Results   Uterus +/- tubes/ovaries, neoplastic, upper 1/3 of vagina - INVASIVE MODERATELY DIFFERENTIATED SQUAMOUS CELL CARCINOMA, 3.0 CM, INVOLVING POSTERIOR WALL OF UPPER THIRD OF VAGINA. - CARCINOMA INVOLVES THE CAUTERIZED DEEP RESECTION MARGIN; MUCOSAL MARGINS ARE NOT INVOLVED. - LYMPHOVASCULAR INVASION IS PRESENT. - UTERUS WITH  BENIGN PROLIFERATIVE ENDOMETRIUM. - BENIGN UNREMARKABLE CERVIX. - BENIGN UNREMARKABLE  BILATERAL FALLOPIAN TUBE. - SEE ONCOLOGY TABLE. - SEE NOTE. Microscopic Comment VAGINA: Resection Procedure: Partial vaginectomy with total hysterectomy and bilateral salpingectomy. Tumor Site: Posterior wall of upper third of vagina. Tumor Size: 3.0 cm. Histologic Type: Squamous cell carcinoma. Histologic Grade: G2: Moderately differentiated. Other Tissue/ Organ: Uterus and bilateral fallopian tubes are not involved by carcinoma. Margins: Deep cauterized margin is positive for carcinoma. Lymphovascular Invasion: Present. Regional Lymph Nodes: No lymph nodes submitted or found. Number of Lymph Nodes Examined: 0. Pathologic Stage Classification (pTNM, AJCC 8th Edition): pT1b, pNX. Representative Tumor Block: 1C.  Lesion is HPV positive   06/01/2018 Surgery   Pre-operative Diagnosis: history of cervical cancer, VAIN III of upper posterior vagina   Post-operative Diagnosis: same   Operation: Robotic-assisted laparoscopic total hysterectomy with bilateral salpingectomy with upper vaginectomy and oophoropexy   Surgeon: Donaciano Eva   Operative Findings:  : 6cm uterus with normal tubes and ovaries (benign appearing functional cyst on left ovary measuring 2cm). Cerclage (merseline) in place with knot anteriorally. Bladder flap adhesions. 3x3cm discoid raised erythematous lesion on posterior upper wall of vagina with close proximity to cervix. No visible lesion remaining in vagina at completion of procedure.     06/22/2018 Imaging   Ct imaging 1. Status post total abdominal hysterectomy. No definitive findings to strongly suggest metastatic disease to the chest, abdomen or pelvis. 2. 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 Further evaluation with nonemergent MRI of the abdomen with and without IV gadolinium is strongly recommended  to provide definitive characterization of this lesion in the near future. 3. Two small pulmonary nodules measuring 5 mm or less in size, nonspecific but statistically likely benign. Attention on routine follow-up studies is recommended to ensure their stability. 4. 4 mm nonobstructive calculus in the upper pole collecting system of the right kidney. No ureteral stones or findings of urinary tract obstruction are noted at this time   07/02/2018 PET scan   1. Hypermetabolic right internal iliac lymph nodes, consistent with metastatic disease. 2. Hypermetabolic cervical lymph nodes bilaterally. This would be atypical metastatic spread for the reported history of vaginal cancer. The patient does have diffuse, but symmetric hypermetabolic uptake in the nasopharynx and hypopharynx. Infectious/inflammatory etiology a consideration although neoplasm not excluded. 3. Hypermetabolic FDG accumulation in the right ovary, nonspecific with no gross mass lesion evident. Left ovary is high in the left pelvis with low level FDG uptake. 4. Tiny bilateral pulmonary nodules as seen on previous diagnostic CT. These are below threshold size for reliable resolution on PET imaging.     07/08/2018 Cancer Staging   Staging form: Vagina, AJCC 7th Edition - Pathologic: Stage III (T1, N1, cM0) - Signed by Heath Lark, MD on 07/08/2018    07/12/2018 Procedure   Technically successful right IJ power-injectable port catheter placement. Ready for routine use.   07/14/2018 - 09/27/2018 Radiation Therapy   Radiation treatment dates:   1. 07/14/18 - 08/19/18                                                  2. 08/20/18-08/31/18  3. 09/06/18, 09/15/18, 09/22/18, 09/27/18   Site/dose: 1. Pelvis; 25 fractions of 1.8 Gy for a total of 45 Gy                    2. Pelvis (nodal boost) ; 8 fractions of 1.8 Gy for a total of 14.4 Gy (59.4 Gy cumulative to involved nodes)                    3.   Vagina; 6 Gy in 4 fractions for a total dose of 24 Gy   Beams/energy: 1. IMRT Photon; 6X                            2. IMRT Photon; 6X                            3. HDR Ir-Vaginal, Iridium HDR, patient was treated with a 2.5 cm diameter segmented cylinder, Rx length of 3.5 cm, prescription was 6 gray to the mucosal surface   07/16/2018 - 08/27/2018 Chemotherapy   She received weekly cisplatin   12/28/2018 PET scan   1. 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 2. No evidence of residual or recurrent hypermetabolic disease. 3. Similar bilateral pulmonary nodules, below PET resolution   01/14/2019 Procedure   Removal of implanted Port-A-Cath utilizing sharp and blunt dissection. The procedure was uncomplicated     Interval History: Patient reports several months of scant vaginal bleeding.  She describes this as noting red blood on the tissue paper when she wipes after voiding.  This does not happen every time she voids.  Last weekend, she passed a dime size clot.  She otherwise denies any passage of clots.  She denies any blood on her underwear or pads.  She will randomly have menstrual-like cramping although denies any pain with bleeding.  This cramping occurs every couple of days and last for a while.  She notes that it is dull aching, does not require treatment with pain medication.  She endorses a good appetite, has intermittent nausea, denies any emesis.  Notes that bowel movements are at baseline, with diarrhea intermittently since completion of treatment.  More recently, she has diarrhea more frequently.  She notes at least several weeks to months of increased urinary frequency.  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 Start laparoscopically HOLD OPEN ABDOMINAL ITEMS    CERCLAGE LAPAROSCOPIC ABDOMINAL N/A 11/19/2016   Procedure: CERCLAGE LAPAROSCOPIC ABDOMINAL;  Surgeon: Governor Specking, MD;  Location: [facility];  Service: Gynecology;  Laterality: N/A;   CERVICAL CONIZATION W/BX N/A 02/18/2018   Procedure: COLD KNIFE CONIZATION OF CERVIX WITH POSSIBLE BIOPSY;  Surgeon: [name], MD;  Location: [place];  Service: Gynecology;  Laterality: N/A;   CESAREAN SECTION N/A 05/27/2017   Procedure: Primary CESAREAN SECTION;  Surgeon: Brien Few, MD;  Location: [place];  Service: Obstetrics;  Laterality: N/A;  EDD: 11/4/18Allergy: Adhesive, Relpax, Sulfa, Ultram   COLD KNIFE CONIZATION  03-29-2010   dr Ronita Hipps  [facility]   DILATION AND CURETTAGE OF UTERUS  2009   DILATION AND CURETTAGE OF UTERUS N/A 03/01/2013   Procedure: CERVICAL DILATATION AND ENDOCERVICAL CURRETAGE AND VAAGINAL BIOPIES;  Surgeon: Alvino Chapel, MD;  Location: [facility];  Service: Gynecology;  Laterality: N/A;    EXTRACORPOREAL SHOCK WAVE LITHOTRIPSY Right 05/10/2020   Procedure: EXTRACORPOREAL SHOCK WAVE LITHOTRIPSY (ESWL);  Surgeon: Robley Fries, MD;  Location: [facility];  Service: Urology;  Laterality: Right;   IR IMAGING GUIDED PORT INSERTION  07/12/2018   IR REMOVAL TUN ACCESS W/ PORT W/O FL MOD SED  01/14/2019   LEEP  02/25/2010;  12/ 2012  dr Ronita Hipps office   NASAL SEPTUM SURGERY  2006   Repair of deviated septum   SIGMOIDOSCOPY  2009   TONSILLECTOMY AND ADENOIDECTOMY  1995    Family History  Problem Relation Age of Onset   Leukemia Other    Diabetes Other    Diabetes Mother    Skin cancer Mother    Hyperlipidemia Mother    Hypertension Father    Asthma Father    Skin cancer Maternal Aunt    Multiple myeloma Maternal Aunt    Testicular cancer Maternal Uncle    Hyperlipidemia Maternal Grandmother    AAA (abdominal aortic aneurysm) Maternal Grandmother    COPD Maternal Grandmother    Prostate cancer Maternal Grandfather    AAA (abdominal aortic aneurysm) Maternal Grandfather    Kidney failure Maternal Grandfather     Social History   Socioeconomic History   Marital status: Single    Spouse name: Not on file   Number of children: 1   Years of education: Not on file   Highest education level: Not on file  Occupational History   Occupation: T[employer]  Tobacco Use   Smoking status: Never   Smokeless tobacco: Never  Vaping Use   Vaping Use: Never used  Substance and Sexual Activity   Alcohol use: Not Currently    Comment: SELDOM, NONE IN  10 MONTHS   Drug use: No   Sexual activity: Yes    Birth control/protection: Surgical  Other Topics Concern   Not on file  Social History Narrative   Not on file   Social Determinants of Health   Financial Resource Strain: Not on file  Food Insecurity: Not on file  Transportation Needs: Not on file  Physical Activity: Not on file  Stress: Not on file  Social Connections: Not on file    Current  Medications:  Current Outpatient Medications:    ALPRAZolam (XANAX) 0.5 MG tablet, Take 0.5 mg by mouth 2 (two)  times daily as needed for anxiety., Disp: , Rfl:    estradiol (ESTRACE) 1 MG tablet, Take 1 tablet (1 mg total) by mouth daily. Take with 0.5 mg tablet to equal 1.5 mg once daily, Disp: 90 tablet, Rfl: 3   LamoTRIgine 200 MG TB24 24 hour tablet, Take 2 tablets (400 mg total) by mouth 2 (two) times daily., Disp: 360 tablet, Rfl: 4   levETIRAcetam (KEPPRA) 750 MG tablet, Take 2 tablets (1,500 mg total) by mouth 2 (two) times daily., Disp: 360 tablet, Rfl: 4   levocetirizine (XYZAL) 5 MG tablet, Take 5 mg by mouth every evening., Disp: , Rfl:    Omega 3 1200 MG CAPS, Take by mouth., Disp: , Rfl:    Red Yeast Rice Extract (RED YEAST RICE PO), Take by mouth., Disp: , Rfl:    Rimegepant Sulfate (NURTEC) 75 MG TBDP, Take by mouth., Disp: , Rfl:    SYNTHROID 112 MCG tablet, Take 1 tablet (112 mcg total) by mouth daily before breakfast., Disp: 45 tablet, Rfl: 3   estradiol (ESTRACE) 0.5 MG tablet, Take 1 tablet (0.5 mg total) by mouth daily. Take with 1 mg tablet to equal 1.5 mg once daily (Patient not taking: Reported on 08/01/2021), Disp: 90 tablet, Rfl: 3   loratadine (CLARITIN) 10 MG tablet, Take 10 mg by mouth every morning., Disp: , Rfl:   Review of Systems: Pertinent positives include abdominal distention, pain, diarrhea, urinary frequency, pelvic pain, vaginal bleeding. Denies appetite changes, fevers, chills, fatigue, unexplained weight changes. Denies hearing loss, neck lumps or masses, mouth sores, ringing in ears or voice changes. Denies cough or wheezing.  Denies shortness of breath. Denies chest pain or palpitations. Denies leg swelling. Denies blood in stools, constipation, nausea, vomiting, or early satiety. Denies pain with intercourse, dysuria, hematuria or incontinence. Denies hot flashes, pelvic pain.   Denies joint pain, back pain or muscle pain/cramps. Denies itching,  rash, or wounds. Denies dizziness, headaches, numbness or seizures. Denies swollen lymph nodes or glands, denies easy bruising or bleeding. Denies anxiety, depression, confusion, or decreased concentration.  Physical Exam: BP 132/83 (BP Location: Right Arm, Patient Position: Sitting)    Pulse 86    Temp 97.6 F (36.4 C) (Tympanic)    Resp 16    Ht '5\' 6"'  (1.676 m)    Wt 158 lb 11.2 oz (72 kg)    LMP 05/17/2018 (Exact Date)    SpO2 97%    BMI 25.61 kg/m  General: Alert, oriented, no acute distress. HEENT: Normocephalic, atraumatic, sclera anicteric. Chest: Clear to auscultation bilaterally.  No wheezes or rhonchi. Cardiovascular: Regular rate and rhythm, no murmurs. Abdomen: soft, nontender.  Normoactive bowel sounds.  No masses or hepatosplenomegaly appreciated.  Small periumbilical hernia. Extremities: Grossly normal range of motion.  Warm, well perfused.  No edema bilaterally. Skin: No rashes or lesions noted. Lymphatics: No cervical, supraclavicular, or inguinal adenopathy. GU: Normal appearing external genitalia without erythema, excoriation, or lesions.  Speculum exam reveals moderately atrophic vaginal mucosa with shortened vagina, no lesions or masses noted but there is petechiae over the upper surfaces of the vagina, likely related to atrophy and radiation changes.  Bimanual exam reveals shortened vagina, smooth, no nodularity.  Rectovaginal exam confirms these findings.  Vaginal biopsy Preoperative diagnosis: Vaginal bleeding in the setting of a history of vaginal cancer Postoperative diagnosis: Same as above Physician: Berline Lopes MD Estimated blood loss: Minimal Procedure: Vaginal biopsy Procedure: The procedure was discussed with the patient including risks and benefits and she gave  verbal consent.  She was placed in dorsolithotomy position and a speculum was placed in the vagina.  The anterior vagina was cleansed with Betadine x3.  Tischler forceps were used to take a small biopsy of  the vaginal mucosa.  There was minimal bleeding noted that did not require treatment to achieve hemostasis.  All instruments removed from the vagina.  Overall the patient tolerated the procedure well.  Laboratory & Radiologic Studies: None new  Assessment & Plan: Kelly Mcclure is a 41 y.o. 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 Positive deep margin (rectovaginal septum).  S/p adjuvant whole pelvic RT with vaginal brachytherapy and radiosensitizing CDDP completed February, 2020.  Complete clinical response on post-treatment PET and physical exam. Last pap test in 03/2021 and negative.   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  Vaginal narrowing and atrophy secondary to radiation and menopause.   Overall, exam very reassuring today.  Biopsy taken of the anterior vaginal mucosa.  I suspect that findings represent radiation changes as well as atrophy.  I do not see a discrete lesion or findings concerning for recurrent cancer.  I will call the patient with biopsy results.  If negative for cancer, I discussed treatment for atrophy including natural lubricants versus vaginal estrogen.  Patient is still on oral estrogen.  She would prefer to start with natural lubricants and will call me to let me know if this does not help with her bleeding.  She also endorses some urinary frequency today.  We will send a UA and culture.  32 minutes of total time was spent for this patient encounter, including preparation, face-to-face counseling with the patient and coordination of care, and documentation of the encounter.  Jeral Pinch, MD  Division of Gynecologic Oncology  Department of Obstetrics and Gynecology  [facility]

## 2021-08-24 LAB — URINE CULTURE: Culture: NO GROWTH

## 2021-08-25 ENCOUNTER — Ambulatory Visit: Payer: 59 | Admitting: Radiation Oncology

## 2021-08-26 ENCOUNTER — Telehealth: Payer: Self-pay

## 2021-08-26 NOTE — Telephone Encounter (Signed)
Spoke with Kelly Mcclure this morning, reviewed urine culture results. Per [name], NP no growth and no sign of UTI. Patient verbalized understanding and states she has been doing well and denies urinary symptoms. Patient inquiring when her biopsy results will be back. Advised patient that results are typically back in about a week and our office will notify her of the results. Instructed to call with any questions or concerns.

## 2021-08-27 LAB — SURGICAL PATHOLOGY

## 2021-09-11 ENCOUNTER — Telehealth: Payer: Self-pay

## 2021-09-11 NOTE — Telephone Encounter (Signed)
Spoke with Kelly Mcclure this afternoon. Per Dr. Berline Lopes the hope is that the bleeding will stop quickly when she starts the estrogen. If cramping continues, we can get an ultrasound to make sure ovaries look normal.  Advised patient to call the office if cramping persists so we can schedule the ultrasound. Patient verbalized understanding.

## 2021-09-11 NOTE — Telephone Encounter (Signed)
Following up with Freda Munro this morning. She reports she is doing well. She had some vaginal spotting 2 days ago but not as much as before.   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 He did not want me using the natural lubricants and preferred I start the vaginal estrogen cream. He has called me in a prescription and I picked it up but have not started using it yet."  Patient reports she has been having some cramping that comes in waves. Cramping is similar to starting her cycle. Cramping occurs daily or every other day. Advised patient that Dr. Berline Lopes will be notified and someone from our office will follow up with her. Patient verbalized understanding.

## 2021-09-17 ENCOUNTER — Other Ambulatory Visit: Payer: Self-pay | Admitting: Gynecologic Oncology

## 2021-09-17 ENCOUNTER — Telehealth: Payer: Self-pay | Admitting: *Deleted

## 2021-09-17 ENCOUNTER — Encounter: Payer: Self-pay | Admitting: Gynecologic Oncology

## 2021-09-17 DIAGNOSIS — R140 Abdominal distension (gaseous): Secondary | ICD-10-CM

## 2021-09-17 DIAGNOSIS — C52 Malignant neoplasm of vagina: Secondary | ICD-10-CM

## 2021-09-17 NOTE — Telephone Encounter (Signed)
Per Dr Berline Lopes scheduled the patient for a CT scan; called and left the patient a message to call the office back

## 2021-09-18 NOTE — Telephone Encounter (Signed)
Spoke with the patient and gave appt date/time/instructions for the CT scan. Patient to pick up contrast and instructions today or tomorrow at the front desk

## 2021-09-20 ENCOUNTER — Other Ambulatory Visit: Payer: Self-pay

## 2021-09-20 ENCOUNTER — Ambulatory Visit (HOSPITAL_COMMUNITY)
Admission: RE | Admit: 2021-09-20 | Discharge: 2021-09-20 | Disposition: A | Discharge status: Home / Self Care | Payer: 59 | Source: Ambulatory Visit | Attending: Gynecologic Oncology | Admitting: Gynecologic Oncology

## 2021-09-20 DIAGNOSIS — C52 Malignant neoplasm of vagina: Secondary | ICD-10-CM | POA: Diagnosis not present

## 2021-09-20 DIAGNOSIS — R140 Abdominal distension (gaseous): Secondary | ICD-10-CM | POA: Diagnosis present

## 2021-09-20 MED ORDER — IOHEXOL 300 MG/ML  SOLN
100.0000 mL | Freq: Once | INTRAMUSCULAR | Status: AC | PRN
  Administered 2021-09-20: 100 mL via INTRAVENOUS

## 2021-10-04 ENCOUNTER — Other Ambulatory Visit: Payer: Self-pay | Admitting: Internal Medicine

## 2021-10-10 ENCOUNTER — Other Ambulatory Visit: Payer: Self-pay

## 2021-10-10 ENCOUNTER — Ambulatory Visit (INDEPENDENT_AMBULATORY_CARE_PROVIDER_SITE_OTHER): Payer: 59 | Admitting: Internal Medicine

## 2021-10-10 ENCOUNTER — Encounter: Payer: Self-pay | Admitting: Internal Medicine

## 2021-10-10 VITALS — BP 120/82 | HR 89 | Ht 66.0 in | Wt 164.0 lb

## 2021-10-10 DIAGNOSIS — E890 Postprocedural hypothyroidism: Secondary | ICD-10-CM

## 2021-10-10 MED ORDER — SYNTHROID 125 MCG PO TABS: 125.0000 ug | ORAL_TABLET | Freq: Every day | ORAL | 3 refills | Status: DC

## 2021-10-10 NOTE — Patient Instructions (Addendum)
Please continue Synthroid 125 mcg daily. ? ?Take the thyroid hormone every day, with water, at least 30 minutes before breakfast, separated by at least 4 hours from: ?- acid reflux medications ?- calcium ?- iron ?- multivitamins ? ?Please come back for a follow-up appointment in 6 months. ? ?

## 2021-10-10 NOTE — Progress Notes (Signed)
Patient ID: Kelly Mcclure, female   DOB: Jan 01, 1981, 41 y.o.   MRN: [id number]   This visit occurred during the SARS-CoV-2 public health emergency.  Safety protocols were in place, including screening questions prior to the visit, additional usage of staff PPE, and extensive cleaning of exam room while observing appropriate contact time as indicated for disinfecting solutions.   HPI  Kelly Mcclure is a 41 y.o.-year-old very pleasant female, initially referred by her ObGyn Dr., Dr. 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 Last visit 6 months Ago.  Interim history: She continues to have chronic hot flashes.  Also, she feels more fatigued lately.  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 She was started on Synthroid soon after.  She had a thyroid uptake and scan >> normal uptake but again demonstrated hot nodule on scan >> RAI treatment in 12/2009 (Dr. Chalmers Cater), after which she developed hypothyroidism >> started on levothyroxine, but currently on d.a.w. Synthroid.    She gave birth in 2018.  During her pregnancy she was on 175 mcg Synthroid.  Afterwards, we decreased the dose gradually.  Pt is on Synthroid 125 mcg daily: - in am - fasting - coffee + creamer >45-60 min later - at least 30 min from b'fast - no Ca, Fe, MVI, PPIs - not on Biotin  Reviewed patient's TFTs: 10/09/2021: TSH 2.31 04/05/2021: TSH 0.06, free T4 1.91 (0.82-1.77) 12/28/2020: TSH 27.5 Lab Results  Component Value Date   TSH 1.14 04/05/2020   TSH 0.15 (L) 04/14/2019   TSH 0.80 03/24/2018   TSH 5.78 (H) 03/20/2017   TSH 5.27 01/16/2017   FREET4 0.83 04/05/2020   FREET4 1.12 04/14/2019   FREET4 0.99 03/24/2018   FREET4 0.91 03/20/2017  Received labs from Dr. Ronita Hipps from 02/22/2019: -TSH 0.02, free T4 1.92, free T3 4.1 -HbA1c 5.2% -Lipids: 192/259/43/97 Received labs from [facility], drawn on 12/07/2017: TSH 0.85, free  T3 3.0, free T4 1.29, all normal 08/28/2017: TSH: 1.28, Free T4: 1.31 07/08/2017: TSH 0.19, free T3 3.2, free T4 1.42 - on levothyroxine 175 mcg daily >> decreased to 6 out of 7 days  04/30/2017  TSH 1.4, free T4 1.48, free T3 3.1 02/19/2017. These were perfect: TSH 1.7, free T4 1.57 (0.82-1.77), free t3 3.4 (2-4.4).  12/10/2016: TSH 0.54 (0.4-4), free T3 3.3 (2-4.4), free T4 1.79 (0.82-1.77) - on LT4 175 mcg 11/11/2016: TSH 0.58 (0.4-4), free T3 3.3 (2-4.4), free T4 1.79 (0.82-1.77) - on LT4 175 mcg 10/2016: TSH 14 - on LT4 137 mcg    Pt denies: - feeling nodules in neck - hoarseness - dysphagia - choking - SOB with lying down  She has + FH of thyroid disorders in: great aunt. + poss FH of thyroid cancer. No FH of thyroid cancer. No h/o radiation tx to head or neck. No herbal supplements. No Biotin use. No recent steroids use.   She has a history of vaginal cancer diagnosed in 2019 >> she had conization and cerclage. She had TAH (no BSO) + ChTx and RxTx. She finished 10/2018.  She developed ovarian insufficiency afterwards.  She was recommended to start Vivelle dot >> rash >> now on po Estradiol. She has a history of absence seizures.  On Lamictal and Keppra.  She had HAs >> MUCH improved on qod Nurtec (preventative measure). She has a history of hair loss for which I recommended B vitamins in the past but she  did not start. He has a history of a kidney stone in 12/2020.   She gave birth in 05/2017 (cesarean section).  ROS: + see HPI Skin: no rashes, + hair loss - chronic  I reviewed pt's medications, allergies, PMH, social hx, family hx, and changes were documented in the history of present illness. Otherwise, unchanged from my initial visit note.  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 Start laparoscopically HOLD OPEN ABDOMINAL ITEMS    CERCLAGE LAPAROSCOPIC ABDOMINAL N/A 11/19/2016   Procedure: CERCLAGE LAPAROSCOPIC ABDOMINAL;  Surgeon: Governor Specking, MD;  Location: [facility];  Service: Gynecology;  Laterality: N/A;   CERVICAL CONIZATION W/BX N/A 02/18/2018   Procedure: COLD KNIFE CONIZATION OF CERVIX WITH POSSIBLE BIOPSY;  Surgeon: [name], MD;  Location: [place];  Service: Gynecology;  Laterality: N/A;   CESAREAN SECTION N/A 05/27/2017   Procedure: Primary CESAREAN SECTION;  Surgeon: Brien Few, MD;  Location: [place];  Service: Obstetrics;  Laterality: N/A;  EDD: 11/4/18Allergy: Adhesive, Relpax, Sulfa, Ultram   COLD KNIFE CONIZATION  03-29-2010   dr Ronita Hipps  [facility]   DILATION AND CURETTAGE OF UTERUS  2009   DILATION AND CURETTAGE OF UTERUS N/A 03/01/2013   Procedure: CERVICAL DILATATION AND ENDOCERVICAL CURRETAGE AND VAAGINAL BIOPIES;  Surgeon: Alvino Chapel, MD;  Location: [facility];  Service: Gynecology;   Laterality: N/A;   EXTRACORPOREAL SHOCK WAVE LITHOTRIPSY Right 05/10/2020   Procedure: EXTRACORPOREAL SHOCK WAVE LITHOTRIPSY (ESWL);  Surgeon: Robley Fries, MD;  Location: [facility];  Service: Urology;  Laterality: Right;   IR IMAGING GUIDED PORT INSERTION  07/12/2018   IR REMOVAL TUN ACCESS W/ PORT W/O FL MOD SED  01/14/2019   LEEP  02/25/2010;  12/ 2012  dr Ronita Hipps office   NASAL SEPTUM SURGERY  2006   Repair of deviated septum   SIGMOIDOSCOPY  2009   TONSILLECTOMY AND ADENOIDECTOMY  1995   Social History   Social History   Marital status: Engaged    Spouse name: N/A   Number of children: 0   Occupational History   Surveyor, quantity at [employer] History Main Topics   Smoking status: Never Smoker   Smokeless tobacco: Never Used   Alcohol  use Yes     Comment: Rare   Drug use: No   Current Outpatient Medications on File Prior to Visit  Medication Sig Dispense Refill   ALPRAZolam (XANAX) 0.5 MG tablet Take 0.5 mg by mouth 2 (two) times daily as needed for anxiety.     estradiol (ESTRACE) 1 MG tablet Take 1 tablet (1 mg total) by mouth daily. Take with 0.5 mg tablet to equal 1.5 mg once daily 90 tablet 3   LamoTRIgine 200 MG TB24 24 hour tablet Take 2 tablets (400 mg total) by mouth 2 (two) times daily. 360 tablet 4   levETIRAcetam (KEPPRA) 750 MG tablet Take 2 tablets (1,500 mg total) by mouth 2 (two) times daily. 360 tablet 4   levocetirizine (XYZAL) 5 MG tablet Take 5 mg by mouth every evening.     Omega 3 1200 MG CAPS Take by mouth.     Red Yeast Rice Extract (RED YEAST RICE PO) Take by mouth.     Rimegepant Sulfate (NURTEC) 75 MG TBDP Take by mouth.     SYNTHROID 112 MCG tablet TAKE 1 TABLET BY MOUTH DAILY BEFORE BREAKFAST. 30 tablet 0   No current facility-administered medications on file prior to visit.   Allergies  Allergen Reactions   Sulfa Antibiotics Swelling    Tongue swelling, throat irritated   Adhesive [Tape]  Other (See Comments)    "skin burn"   Other Swelling and Other (See Comments)    Magic mouth wash/ causes swelling of the tongue   Relpax [Eletriptan Hydrobromide] Other (See Comments)    Stroke-like symptoms   Topiramate Other (See Comments)   Ultram [Tramadol] Other (See Comments)    2004--  Per pt had seizure due to interaction with other medication she was taking at the time   Family History  Problem Relation Age of Onset   Leukemia Other    Diabetes Other    Diabetes Mother    Skin cancer Mother    Hyperlipidemia Mother    Hypertension Father    Asthma Father    Skin cancer Maternal Aunt    Multiple myeloma Maternal Aunt    Testicular cancer Maternal Uncle    Hyperlipidemia Maternal Grandmother    AAA (abdominal aortic aneurysm) Maternal Grandmother    COPD Maternal Grandmother    Prostate cancer Maternal Grandfather    AAA (abdominal aortic aneurysm) Maternal Grandfather    Kidney failure Maternal Grandfather    PE: BP 120/82 (BP Location: Left Arm, Patient Position: Sitting, Cuff Size: Normal)    Pulse 89    Ht '5\' 6"'  (1.676 m)    Wt 164 lb (74.4 kg)    LMP 05/17/2018 (Exact Date)    SpO2 97%    BMI 26.47 kg/m  Wt Readings from Last 3 Encounters:  10/10/21 164 lb (74.4 kg)  08/23/21 158 lb 11.2 oz (72 kg)  08/01/21 161 lb 6.4 oz (73.2 kg)   Constitutional: normal weight, in NAD Eyes: PERRLA, EOMI, no exophthalmos ENT: moist mucous membranes, no thyromegaly, no cervical lymphadenopathy Cardiovascular: RRR, No MRG Respiratory: CTA B Musculoskeletal: no deformities, strength intact in all 4 Skin: moist, warm, no rashes Neurological: no tremor with outstretched hands, DTR normal in all 4  ASSESSMENT: 1. Postablative Hypothyroidism - uncontrolled  PLAN:  1. Patient with longstanding postablative hypothyroidism, on Synthroid d.a.w. - latest thyroid labs reviewed with pt. >> 6-day before last visit, a TSH was checked by Dr. Ronita Hipps and was suppressed, at 0.06.  Of  note,  previous TSH obtained 4 months prior was very high, at 27.5, also per check by Dr. Ronita Hipps.  At that time, she was mentioning that she missed Synthroid doses.  Since then, she started to take it correctly.  At last visit, I advised her to return for another set of TFTs in several weeks to see if we needed to decrease her Synthroid dose.  However, she did not return for these.  She continues on Synthroid 125 mcg daily. - pt feels good on this dose.  She has a history of hot flashes most likely related to surgical premature menopause, for which she is on estrogen now.  Hot flashes improved on estrogen dose -now only having them at night.  She also has chronic hair loss.  She does feel some fatigue lately. - we discussed about taking the thyroid hormone every day, with water, >30 minutes before breakfast, separated by >4 hours from acid reflux medications, calcium, iron, multivitamins. Pt. is taking it correctly. - at this visit, we called Dr. Kennith Maes office and obtain her latest TSH-normal yesterday - for now, we can continue with Synthroid 125 mcg daily. - I will see her back in 1 year.  We discussed that she may have labs in 6 months to make sure that they remain stable on the above dose.  Philemon Kingdom, MD PhD [facility]

## 2022-01-23 ENCOUNTER — Ambulatory Visit: Payer: 59 | Admitting: Gynecologic Oncology

## 2022-02-05 ENCOUNTER — Encounter: Payer: Self-pay | Admitting: Gynecologic Oncology

## 2022-02-07 ENCOUNTER — Inpatient Hospital Stay: Payer: 59 | Attending: Gynecologic Oncology | Admitting: Gynecologic Oncology

## 2022-02-07 ENCOUNTER — Other Ambulatory Visit (HOSPITAL_COMMUNITY)
Admission: RE | Admit: 2022-02-07 | Discharge: 2022-02-07 | Disposition: A | Discharge status: Home / Self Care | Payer: 59 | Source: Ambulatory Visit | Attending: Gynecologic Oncology | Admitting: Gynecologic Oncology

## 2022-02-07 ENCOUNTER — Encounter: Payer: Self-pay | Admitting: Gynecologic Oncology

## 2022-02-07 VITALS — BP 131/90 | HR 82 | Temp 98.1°F | Resp 18 | Wt 164.0 lb

## 2022-02-07 DIAGNOSIS — Z8544 Personal history of malignant neoplasm of other female genital organs: Secondary | ICD-10-CM | POA: Insufficient documentation

## 2022-02-07 DIAGNOSIS — C52 Malignant neoplasm of vagina: Secondary | ICD-10-CM

## 2022-02-07 DIAGNOSIS — E2839 Other primary ovarian failure: Secondary | ICD-10-CM | POA: Diagnosis not present

## 2022-02-07 DIAGNOSIS — Z90722 Acquired absence of ovaries, bilateral: Secondary | ICD-10-CM | POA: Insufficient documentation

## 2022-02-07 DIAGNOSIS — Z923 Personal history of irradiation: Secondary | ICD-10-CM | POA: Diagnosis not present

## 2022-02-07 DIAGNOSIS — R140 Abdominal distension (gaseous): Secondary | ICD-10-CM | POA: Diagnosis not present

## 2022-02-07 DIAGNOSIS — Z90710 Acquired absence of both cervix and uterus: Secondary | ICD-10-CM | POA: Insufficient documentation

## 2022-02-07 NOTE — Progress Notes (Signed)
Gynecologic Oncology Return Clinic Visit  02/07/22  Reason for Visit: Surveillance visit in the setting of a history of vaginal cancer  Treatment History: Oncology History  Vaginal cancer ([place])  03/01/2013 Surgery   OPERATIVE REPORT   PREOPERATIVE DIAGNOSIS: High-grade dysplasia on Pap smear   POSTOPERATIVE DIAGNOSIS: Same   PROCEDURE: Exam under anesthesia, endocervical curettage, vaginal biopsies   SURGEON: Marti Sleigh, M.D  SURGICAL FINDINGS: Examination under anesthesia revealed a cervix which was flush with the vaginal fornices. The cervical canal was stenotic but once entered and dilated the uterus sounded approximate 7 cm anteriorly. Apply Lugol solution to the cervix showed no lesions. There were 2 areas of non-uptake of Lugol's in the posterior vagina which were biopsied   11/09/2016 Surgery   Preoperative diagnosis: Short cervix S/P cervical conization for microinvasive cervical cancer, intrauterine pregnancy at 11 weeks   Postoperative diagnosis: Short cervix S/P cervical conization for microinvasive cervical cancer, intrauterine pregnancy at 11 weeks   Procedure: Laparoscopy, transabdominal cervical isthmic cerclage x 2, intraoperative ultrasound guidance   Anesthesia: Gen. Endotracheal   Surgeon:Tamer Kerin Perna, MD Findings: On exam under anesthesia the cervix was closed and deviated to the left. On laparoscopy the liver, gallbladder, and appendix were normal.  The uterus was gravid and retroverted and retroflexed, both tubes and ovaries appeared normal.   Intraoperative ultrasound transabdominally and transvaginally showed a singleton intrauterine pregnancy at length consistent with 11 weeks. There was fetal cardiac activity and movements.     01/19/2018 Pathology Results   Cervical biopsy showed high grade squamous intraepithelial with severe dysplasia   02/09/2018 Pathology Results   Cervix, biopsy - SUPERFICIAL FRAGMENTS OF CERVICAL MUCOSA SHOWING AT LEAST  HIGH GRADE SQUAMOUS INTRAEPITHELIAL LESION (HSIL, CIN-III). - DEFINITIVE INVASION IS NOT SEEN BUT CANNOT BE ENTIRELY RULED OUT.   02/18/2018 Pathology Results   1. Cervix, cone - BENIGN SQUAMOUS AND ENDOCERVICAL MUCOSA, SEE COMMENT. - NO DYSPLASIA OR MALIGNANCY. 2. Endocervix, curettage - BLOOD. - NO MUCOSA PRESENT. Microscopic Comment 1. p16 is negative in the squamous epithelium   02/18/2018 Surgery   Preop Diagnosis: CIN III   Postoperative Diagnosis: same   Surgery: cold knife conization of cervix    Surgeons:  Donaciano Eva, MD Pathology: cervical cone with marking stitch at 12 o'clock, post cone ECC   Operative findings: no grossly visible exophytic lesions on ectocervix. Cervix flush with vagina.     03/11/2018 Pathology Results   Vagina, biopsy, posterior upper vaginal wall - HIGH GRADE SQUAMOUS DYSPLASIA, SEE COMMENT. Microscopic Comment There are detached squamous fragments with high grade dysplastic features. Several of the fragments have a minimal amount of attached stroma which is inflamed and focally fibrotic. While an invasive component cannot be ruled out, the limited stroma present hampers evaluate for invasion   04/27/2018 Pathology Results   Vagina, biopsy, posterior - SUPERFICIAL FRAGMENTS OF EXTENSIVE SQUAMOUS CELL CARCINOMA IN SITU. - SEE NOTE. Diagnosis Note Though focally suspicious, definitive evidence of invasive carcinoma is not seen in the submitted biopsies. The submitted fragments are superficial in nature and a deeper, more severe process cannot be ruled out. Dr Tresa Moore has reviewed this case and concurs with the above interpretation.   06/01/2018 Pathology Results   Uterus +/- tubes/ovaries, neoplastic, upper 1/3 of vagina - INVASIVE MODERATELY DIFFERENTIATED SQUAMOUS CELL CARCINOMA, 3.0 CM, INVOLVING POSTERIOR WALL OF UPPER THIRD OF VAGINA. - CARCINOMA INVOLVES THE CAUTERIZED DEEP RESECTION MARGIN; MUCOSAL MARGINS ARE NOT INVOLVED. -  LYMPHOVASCULAR INVASION IS PRESENT. - UTERUS WITH BENIGN  PROLIFERATIVE ENDOMETRIUM. - BENIGN UNREMARKABLE CERVIX. - BENIGN UNREMARKABLE BILATERAL FALLOPIAN TUBE. - SEE ONCOLOGY TABLE. - SEE NOTE. Microscopic Comment VAGINA: Resection Procedure: Partial vaginectomy with total hysterectomy and bilateral salpingectomy. Tumor Site: Posterior wall of upper third of vagina. Tumor Size: 3.0 cm. Histologic Type: Squamous cell carcinoma. Histologic Grade: G2: Moderately differentiated. Other Tissue/ Organ: Uterus and bilateral fallopian tubes are not involved by carcinoma. Margins: Deep cauterized margin is positive for carcinoma. Lymphovascular Invasion: Present. Regional Lymph Nodes: No lymph nodes submitted or found. Number of Lymph Nodes Examined: 0. Pathologic Stage Classification (pTNM, AJCC 8th Edition): pT1b, pNX. Representative Tumor Block: 1C.  Lesion is HPV positive   06/01/2018 Surgery   Pre-operative Diagnosis: history of cervical cancer, VAIN III of upper posterior vagina   Post-operative Diagnosis: same   Operation: Robotic-assisted laparoscopic total hysterectomy with bilateral salpingectomy with upper vaginectomy and oophoropexy   Surgeon: Donaciano Eva   Operative Findings:  : 6cm uterus with normal tubes and ovaries (benign appearing functional cyst on left ovary measuring 2cm). Cerclage (merseline) in place with knot anteriorally. Bladder flap adhesions. 3x3cm discoid raised erythematous lesion on posterior upper wall of vagina with close proximity to cervix. No visible lesion remaining in vagina at completion of procedure.     06/22/2018 Imaging   Ct imaging 1. Status post total abdominal hysterectomy. No definitive findings to strongly suggest metastatic disease to the chest, abdomen or pelvis. 2. 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 Further evaluation with nonemergent MRI of the abdomen with  and without IV gadolinium is strongly recommended to provide definitive characterization of this lesion in the near future. 3. Two small pulmonary nodules measuring 5 mm or less in size, nonspecific but statistically likely benign. Attention on routine follow-up studies is recommended to ensure their stability. 4. 4 mm nonobstructive calculus in the upper pole collecting system of the right kidney. No ureteral stones or findings of urinary tract obstruction are noted at this time   07/02/2018 PET scan   1. Hypermetabolic right internal iliac lymph nodes, consistent with metastatic disease. 2. Hypermetabolic cervical lymph nodes bilaterally. This would be atypical metastatic spread for the reported history of vaginal cancer. The patient does have diffuse, but symmetric hypermetabolic uptake in the nasopharynx and hypopharynx. Infectious/inflammatory etiology a consideration although neoplasm not excluded. 3. Hypermetabolic FDG accumulation in the right ovary, nonspecific with no gross mass lesion evident. Left ovary is high in the left pelvis with low level FDG uptake. 4. Tiny bilateral pulmonary nodules as seen on previous diagnostic CT. These are below threshold size for reliable resolution on PET imaging.     07/08/2018 Cancer Staging   Staging form: Vagina, AJCC 7th Edition - Pathologic: Stage III (T1, N1, cM0) - Signed by Heath Lark, MD on 07/08/2018   07/12/2018 Procedure   Technically successful right IJ power-injectable port catheter placement. Ready for routine use.   07/14/2018 - 09/27/2018 Radiation Therapy   Radiation treatment dates:   1. 07/14/18 - 08/19/18                                                  2. 08/20/18-08/31/18  3. 09/06/18, 09/15/18, 09/22/18, 09/27/18   Site/dose: 1. Pelvis; 25 fractions of 1.8 Gy for a total of 45 Gy                    2. Pelvis (nodal boost) ; 8 fractions of 1.8 Gy for a total of 14.4 Gy (59.4 Gy cumulative  to involved nodes)                    3.  Vagina; 6 Gy in 4 fractions for a total dose of 24 Gy   Beams/energy: 1. IMRT Photon; 6X                            2. IMRT Photon; 6X                            3. HDR Ir-Vaginal, Iridium HDR, patient was treated with a 2.5 cm diameter segmented cylinder, Rx length of 3.5 cm, prescription was 6 gray to the mucosal surface   07/16/2018 - 08/27/2018 Chemotherapy   She received weekly cisplatin   12/28/2018 PET scan   1. 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 2. No evidence of residual or recurrent hypermetabolic disease. 3. Similar bilateral pulmonary nodules, below PET resolution   01/14/2019 Procedure   Removal of implanted Port-A-Cath utilizing sharp and blunt dissection. The procedure was uncomplicated    Patient had a history of stage IA1 diagnosed by a LEEP and repeat CKC on 04/01/2010.  Fertility preservation was chosen.  The patient was subsequently followed with Pap smears.    She had intermittently abnormal paps smears over the subsequent years with close follow-up and regular colposcopic evaluations.   March 21, 2016 Pap smear showed HGSIL.  Endocervical curettage showed benign endocervical glands.   Pap smear July 15, 2016 showed HGSIL.  Endocervical curettage showed benign endocervical glands.   January 13, 2018 Pap smear showed HGSIL.   An exam by Dr Fermin Schwab on July 9th showed a friable exophytic lesion on the cervix which was biopsied. Final pathology showed "at least CIN III, invasion could not be ruled out". Colpo had been difficult as the cervix was flush with the vagina and the upper vagina was narrowed.   The patient was interested in a definitive hysterectomy procedure however she required an excisional procedure to rule out invasive carcinoma before having this.   On 02/18/18 she underwent cold knife conization of the cervix in the operating room.  Inspection of the cervix with acetic acid application  was grossly unremarkable with no apparent lesions seen.  The upper vagina was also grossly normal.  The cervix was small, fairly flush with the surrounding vagina, however the conization procedure was relatively straightforward.  A 0.9 cm AP dimension by 1.5 cm deep dimension cone was taken without difficulty.  There is minimal bleeding in the operating room. Final pathology revealed no residual dysplasia.   On postoperative examination a friable lesion was seen on the posterior upper vaginal wall measuring approximately 2cm. It was separate to, but abutting, the posterior lip of the cervix. 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    The patient was recommended surgical excision of the lesion, and due to its proximity with the cervix, was recommended hysterectomy (without oophorectomy) at the time of surgery in order to establish adequate margins.  The patient had her surgery scheduled (robotic hysterectomy, upper vaginectomy), however canceled this after she discussed the proposed surgery with her referring doctor, Dr Ronita Hipps, who had concerns about the surgical plan. Dr Ronita Hipps requested that Aariyana see my partner, Dr Fermin Schwab for a second opinion.    On 04/27/18 Keeghan was evaluated by Dr Fermin Schwab who visualized the posterior wall lesion and repeated biopsies (superficial fragments of extensive squamous cell carcinoma in situ).  Given his shared concern that this lesion may harbor occult invasive disease, and due to its proximity to the cervix, he agreed with the surgical plan of hysterectomy (not oophorectomy) with salpingectomy and upper vaginectomy.    On June 01, 2018 the patient underwent a robotic assisted total hysterectomy with upper vaginectomy bilateral salpingectomy and due for pexy.  The surgical procedure was unremarkable and uncomplicated.  Operative findings were significant for a grossly normal uterus and cervix  and tubes and ovaries.  A 3 x 3 cm discoid raised erythematous lesion was visualized vaginally on the posterior upper wall of the vagina with close proximity to the cervix (approximately 7 mm margin to the cervicovaginal junction.  At the completion of the procedure there was no visible remaining lesion in the vagina.  Via intraperitoneal inspection there was no eruption of the tumor through into the intraperitoneal space.  The rectovaginal septum was created with a ease and there was no gross involvement of the anterior rectal wall during surgery.  Inspection of the specimen revealed the margins to be grossly clear of disease.  At the completion of the procedure the ovaries were pexed to the pelvic brim.   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  The carcinoma involved the cauterized deep resection margin representing the rectovaginal septal margin.  Lymphovascular space invasion was present.  The cervix and uterus and fallopian tubes are grossly unremarkable.   Postop PET showed a PET avid Rt pelvic lymph node consistent with metastatic disease (stage IIIC).    She was treated with radiosensitizing weekly CDDP with whole pelvic RT and vaginal brachytherapy (the pelvis received 25 fractions of 1.8 Gy for a total of 45 Gray, there was a pelvic nodal boost of 8 fractions of 1.8 Gray for a total of 14.4 Gy, cumulative dose to involve nodes of 59.4 Gy; the vagina received 6 Gy in 4 fractions for total dose of 24 Gray).  Treatment dates 07-14-18 to 09/27/18.    Repeat PET imaging 3 months post treatment was performed on Dec 28, 2018 and revealed response to therapy of the right pelvic nodal metastases with no increased uptake in the nodal basins and no lymphadenopathy.  There is no evidence of residual recurrent disease.  There was similar findings to the bilateral pulmonary nodules which appeared benign and  stable from prior imaging.   She tolerated treatment well with no major toxicities.   Interval History: Patient reports overall doing well.  She denies any recent vaginal bleeding or pelvic cramping.  Denies any abdominal pain.  Reports regular bowel function, which still includes some intermittent diarrhea.  Still having intermittent bloating which she describes as almost daily, does not seem related to what she eats or when she eats.  Denies any urinary symptoms.  Continues to have some hot flashes.  Past Medical/Surgical History: Past Medical History:  Diagnosis Date   Cancer ([place]) DX 2011   CERVICAL    Cough  WITH SEASONAL ALLEGIES, NONPRODUCTIVE LAST 2 DAYS   H/O radioactive iodine thyroid ablation 12/2009   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 Start laparoscopically HOLD OPEN ABDOMINAL ITEMS    CERCLAGE LAPAROSCOPIC ABDOMINAL N/A 11/19/2016   Procedure: CERCLAGE LAPAROSCOPIC ABDOMINAL;  Surgeon: Governor Specking, MD;  Location: [facility];   Service: Gynecology;  Laterality: N/A;   CERVICAL CONIZATION W/BX N/A 02/18/2018   Procedure: COLD KNIFE CONIZATION OF CERVIX WITH POSSIBLE BIOPSY;  Surgeon: [name], MD;  Location: [facility];  Service: Gynecology;  Laterality: N/A;   CESAREAN SECTION N/A 05/27/2017   Procedure: Primary CESAREAN SECTION;  Surgeon: Brien Few, MD;  Location: [facility];  Service: Obstetrics;  Laterality: N/A;  EDD: 11/4/18Allergy: Adhesive, Relpax, Sulfa, Ultram   COLD KNIFE CONIZATION  03-29-2010   dr Ronita Hipps  [facility]   DILATION AND CURETTAGE OF UTERUS  2009   DILATION AND CURETTAGE OF UTERUS N/A 03/01/2013   Procedure: CERVICAL DILATATION AND ENDOCERVICAL CURRETAGE AND VAAGINAL BIOPIES;  Surgeon: Alvino Chapel, MD;  Location: [facility];  Service: Gynecology;  Laterality: N/A;   EXTRACORPOREAL SHOCK WAVE LITHOTRIPSY Right 05/10/2020   Procedure: EXTRACORPOREAL SHOCK WAVE LITHOTRIPSY (ESWL);  Surgeon: Robley Fries, MD;  Location: [facility];  Service: Urology;  Laterality: Right;   IR IMAGING GUIDED PORT INSERTION  07/12/2018   IR REMOVAL TUN ACCESS W/ PORT W/O FL MOD SED  01/14/2019   LEEP  02/25/2010;  12/ 2012  dr Ronita Hipps office   NASAL SEPTUM SURGERY  2006   Repair of deviated septum   SIGMOIDOSCOPY  2009   TONSILLECTOMY AND ADENOIDECTOMY  1995    Family History  Problem Relation Age of Onset   Diabetes Mother    Skin cancer Mother    Hyperlipidemia Mother    Breast cancer Mother        Right   Hypertension Father    Asthma Father    Skin cancer Maternal Aunt    Multiple myeloma Maternal Aunt    Testicular cancer Maternal Uncle    Hyperlipidemia Maternal Grandmother    AAA (abdominal aortic aneurysm) Maternal Grandmother    COPD Maternal Grandmother    Prostate cancer Maternal Grandfather    AAA (abdominal aortic aneurysm) Maternal Grandfather    Kidney failure Maternal Grandfather    Leukemia Other    Diabetes Other     Social History    Socioeconomic History   Marital status: Single    Spouse  name: Not on file   Number of children: 1   Years of education: Not on file   Highest education level: Not on file  Occupational History   Occupation: T[employer]  Tobacco Use   Smoking status: Never   Smokeless tobacco: Never  Vaping Use   Vaping Use: Never used  Substance and Sexual Activity   Alcohol use: Not Currently    Comment: SELDOM, NONE IN  10 MONTHS   Drug use: No   Sexual activity: Yes    Birth control/protection: Surgical  Other Topics Concern   Not on file  Social History Narrative   Not on file   Social Determinants of Health   Financial Resource Strain: Not on file  Food Insecurity: Not on file  Transportation Needs: Not on file  Physical Activity: Not on file  Stress: Not on file  Social Connections: Not on file    Current Medications:  Current Outpatient Medications:    LamoTRIgine 200 MG TB24 24 hour tablet, Take 2 tablets (400 mg total) by mouth 2 (two) times daily., Disp: 360 tablet, Rfl: 4   levETIRAcetam (KEPPRA) 750 MG tablet, Take 2 tablets (1,500 mg total) by mouth 2 (two) times daily., Disp: 360 tablet, Rfl: 4   levocetirizine (XYZAL) 5 MG tablet, Take 5 mg by mouth every evening., Disp: , Rfl:    Omega 3 1200 MG CAPS, Take by mouth., Disp: , Rfl:    Red Yeast Rice Extract (RED YEAST RICE PO), Take by mouth., Disp: , Rfl:    SYNTHROID 125 MCG tablet, Take 1 tablet (125 mcg total) by mouth daily before breakfast., Disp: 90 tablet, Rfl: 3  Review of Systems: + abdominal distention, hot flashes Denies appetite changes, fevers, chills, fatigue, unexplained weight changes. Denies hearing loss, neck lumps or masses, mouth sores, ringing in ears or voice changes. Denies cough or wheezing.  Denies shortness of breath. Denies chest pain or palpitations. Denies leg swelling. Denies abdominal pain, blood in stools, constipation, diarrhea, nausea, vomiting, or early satiety. Denies pain with  intercourse, dysuria, frequency, hematuria or incontinence. Denies pelvic pain, vaginal bleeding or vaginal discharge.   Denies joint pain, back pain or muscle pain/cramps. Denies itching, rash, or wounds. Denies dizziness, headaches, numbness or seizures. Denies swollen lymph nodes or glands, denies easy bruising or bleeding. Denies anxiety, depression, confusion, or decreased concentration.  Physical Exam: BP 131/90 (BP Location: Left Arm, Patient Position: Sitting)   Pulse 82   Temp 98.1 F (36.7 C) (Tympanic)   Resp 18   Wt 164 lb (74.4 kg)   LMP 05/17/2018 (Exact Date)   SpO2 98%   BMI 26.47 kg/m  General: Alert, oriented, no acute distress. HEENT: Normocephalic, atraumatic, sclera anicteric. Chest: Clear to auscultation bilaterally.  No wheezes or rhonchi. Cardiovascular: Regular rate and rhythm, no murmurs. Abdomen: soft, nontender.  Normoactive bowel sounds.  No masses or hepatosplenomegaly appreciated.  Small periumbilical hernia. Extremities: Grossly normal range of motion.  Warm, well perfused.  No edema bilaterally. Skin: No rashes or lesions noted. Lymphatics: No cervical, supraclavicular, or inguinal adenopathy. GU: Normal appearing external genitalia without erythema, excoriation, or lesions.  Speculum exam reveals moderately atrophic vaginal mucosa with shortened vagina, no lesions or masses noted but there is petechiae over the upper surfaces of the vagina, likely related to atrophy and radiation changes.  Bimanual exam reveals shortened vagina, smooth, no nodularity.  Pap test and HPV collected. Rectovaginal exam confirms these findings.  Laboratory & Radiologic Studies: CT A/P on 09/20/21:  IMPRESSION: 1. Prior hysterectomy without new or progressive findings to suggest local recurrence or abdominopelvic metastatic disease. 2. Diffuse hepatic steatosis. 3. Sigmoid colonic diverticulosis without findings of acute diverticulitis. 4. Large volume of formed stool  throughout the colon.  Assessment & Plan: RUDOLPH DOBLER is a 41 y.o. 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 Positive deep margin (rectovaginal septum).  S/p adjuvant whole pelvic RT with vaginal brachytherapy and radiosensitizing CDDP completed February, 2020.   Complete clinical response on post-treatment PET and physical exam. Last pap test in 03/2021 and negative.   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  Vaginal narrowing and atrophy secondary to radiation and menopause.   Overall, patient is doing well.  She is NED on exam today.  She has had no further bleeding or cramping.  She continues to have some abdominal bloating.  She had a recent CT scan in February without any evidence of disease, large stool burden.  I have asked her to either keep a food diary or consider doing elimination diet to identify if there are certain foods that may be leading to her symptoms.  I also think it may be worth referral to gastroenterology.  She like to hold off on referral to GI at this time.  I have asked her to give me an update in the next month or 2 about how she is feeling.  If symptoms continue, I would recommend reconsidering GI referral.   Per NCCN surveillance recommendations, we will continue with visits every 6 months alternating between our office and radiation oncology.  The patient is seeing Dr. Sondra Come in December.  I have asked her to call back after the new year to schedule a visit to see me in a year.  We reviewed signs and symptoms that should prompt a phone call sooner than that.  Pap smear performed today.  I will contact her with these results.  22 minutes of total time was spent for this patient encounter, including preparation, face-to-face counseling with the patient and coordination of care, and documentation of the encounter.  Jeral Pinch, MD  Division of Gynecologic Oncology  Department of  Obstetrics and Gynecology  [facility]

## 2022-02-07 NOTE — Patient Instructions (Signed)
It was good to see you today.  I do not see or feel any evidence of cancer recurrence on your exam.  I will release your Pap smear once it is back, likely at the end of next week.  We will continue with visits every 6 months alternating between my office and radiation oncology.  Please call back sometime after the new year to schedule a visit to see me in June or July 2024.  As always, if you develop new and concerning symptoms before then, please call to see me sooner.  Keep me posted about your bloating.  I am happy to put in a referral to have you see GI.

## 2022-02-20 LAB — CYTOLOGY - PAP
Comment: NEGATIVE
Diagnosis: NEGATIVE
High risk HPV: NEGATIVE

## 2022-02-21 ENCOUNTER — Telehealth: Payer: Self-pay

## 2022-02-21 NOTE — Telephone Encounter (Signed)
Attempted to contact patient to review pap smear results. Unable to reach patient, left message requesting return call.

## 2022-02-21 NOTE — Telephone Encounter (Signed)
Pt aware of negative pap smear results.

## 2022-04-17 ENCOUNTER — Encounter: Payer: Self-pay | Admitting: Internal Medicine

## 2022-04-17 ENCOUNTER — Ambulatory Visit (INDEPENDENT_AMBULATORY_CARE_PROVIDER_SITE_OTHER): Payer: 59 | Admitting: Internal Medicine

## 2022-04-17 VITALS — BP 110/70 | HR 102 | Ht 66.0 in | Wt 163.4 lb

## 2022-04-17 DIAGNOSIS — E890 Postprocedural hypothyroidism: Secondary | ICD-10-CM | POA: Diagnosis not present

## 2022-04-17 NOTE — Progress Notes (Signed)
Patient ID: Kelly Mcclure, female   DOB: [birth year], 41 y.o.   MRN: [id number]   HPI  Kelly Mcclure is a 41 y.o.-year-old very pleasant female, initially referred by her ObGyn Dr., Dr. 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 Last visit 6 months ago. Ago.  Interim history: She continues to have chronic fatigue. She went to the beach recently and forgot to take Synthroid with her-she was off the medicine for 2 to 3 days. This morning I received her recent TSH checked 2 days ago and this was elevated.  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 She was started on Synthroid soon after.  She had a thyroid uptake and scan >> normal uptake but again demonstrated hot nodule on scan >> RAI treatment in 12/2009 (Dr. Chalmers Cater), after which she developed hypothyroidism >> started on levothyroxine, but currently on d.a.w. Synthroid.    She gave birth in 2018.  During her pregnancy she was on 175 mcg Synthroid.  Afterwards, we decreased the dose gradually to 125 mcg daily.  Pt is on Synthroid 125 mcg daily: - missed 2-3 days while at the beach - in am - fasting - coffee + creamer >45-60 min later - at least 30 min from b'fast - no Ca, Fe, MVI, PPIs - not on Biotin  Reviewed patient's TFTs: 04/15/2022: TSH 5.93 (0.4-4) 10/09/2021: TSH 2.31 04/05/2021: TSH 0.06, free T4 1.91 (0.82-1.77) 12/28/2020: TSH 27.5 Lab Results  Component Value Date   TSH 1.14 04/05/2020   TSH 0.15 (L) 04/14/2019   TSH 0.80 03/24/2018   TSH 5.78 (H) 03/20/2017   TSH 5.27 01/16/2017   FREET4 0.83 04/05/2020   FREET4 1.12 04/14/2019   FREET4 0.99 03/24/2018   FREET4 0.91 03/20/2017   Pt denies: - feeling nodules in neck - hoarseness - dysphagia - choking  She has + FH of thyroid disorders in: great aunt. + poss FH of thyroid cancer. No FH of thyroid cancer. No h/o radiation tx to head or neck. No herbal supplements. No  Biotin use. No recent steroids use.   She has a history of vaginal cancer diagnosed in 2019 >> she had conization and cerclage. She had TAH (no BSO) + ChTx and RxTx. She finished 10/2018.  She developed ovarian insufficiency afterwards.  She was recommended to start Vivelle dot >> rash >> now on po Estradiol. She has a history of absence seizures.  On Lamictal and Keppra.  She had HAs >> MUCH improved on qod Nurtec (preventative measure). She has a history of hair loss for which I recommended B vitamins in the past but she did not start. He has a history of a kidney stone in 12/2020.   She gave birth in 05/2017 (cesarean section) - baby girl.  ROS: + see HPI + hair loss - chronic + Hot flashes-chronic  I reviewed pt's medications, allergies, PMH, social hx, family hx, and changes were documented in the history of present illness. Otherwise, unchanged from my initial visit note.  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 Start laparoscopically HOLD OPEN ABDOMINAL ITEMS    CERCLAGE LAPAROSCOPIC ABDOMINAL N/A 11/19/2016   Procedure: CERCLAGE LAPAROSCOPIC ABDOMINAL;  Surgeon: Governor Specking, MD;  Location: [facility];  Service: Gynecology;  Laterality: N/A;   CERVICAL CONIZATION W/BX N/A 02/18/2018   Procedure: COLD KNIFE CONIZATION OF CERVIX WITH POSSIBLE BIOPSY;  Surgeon: [name], MD;  Location: [place];  Service: Gynecology;  Laterality: N/A;   CESAREAN SECTION N/A 05/27/2017   Procedure: Primary CESAREAN SECTION;  Surgeon: Brien Few, MD;  Location: [place];  Service: Obstetrics;  Laterality: N/A;  EDD: 11/4/18Allergy: Adhesive, Relpax, Sulfa, Ultram   COLD KNIFE CONIZATION  03-29-2010   dr Ronita Hipps  [facility]   DILATION AND CURETTAGE OF UTERUS  2009   DILATION AND CURETTAGE OF UTERUS N/A 03/01/2013   Procedure: CERVICAL DILATATION AND ENDOCERVICAL CURRETAGE AND VAAGINAL BIOPIES;  Surgeon: Alvino Chapel, MD;  Location: [facility];  Service: Gynecology;  Laterality: N/A;   EXTRACORPOREAL SHOCK WAVE LITHOTRIPSY Right 05/10/2020   Procedure: EXTRACORPOREAL SHOCK WAVE LITHOTRIPSY (ESWL);  Surgeon: Robley Fries, MD;  Location: [facility];  Service: Urology;  Laterality: Right;   IR IMAGING GUIDED PORT INSERTION  07/12/2018   IR REMOVAL TUN ACCESS W/ PORT W/O FL MOD SED  01/14/2019   LEEP  02/25/2010;  12/ 2012  dr Ronita Hipps office   NASAL SEPTUM SURGERY  2006   Repair of deviated septum   SIGMOIDOSCOPY  2009   TONSILLECTOMY AND ADENOIDECTOMY  1995   Social History   Social History   Marital status: Engaged    Spouse name: N/A   Number of children: 0   Occupational History   Surveyor, quantity at [employer] History Main Topics   Smoking status: Never Smoker   Smokeless tobacco:  Never Used   Alcohol use Yes     Comment: Rare   Drug use: No   Current Outpatient Medications on File Prior to Visit  Medication Sig Dispense Refill   LamoTRIgine 200 MG TB24 24 hour tablet Take 2 tablets (400 mg total) by mouth 2 (two) times daily. 360 tablet 4   levETIRAcetam (KEPPRA) 750 MG tablet Take 2 tablets (1,500 mg total) by mouth 2 (two) times daily. 360 tablet 4   levocetirizine (XYZAL) 5 MG tablet Take 5 mg by mouth every evening.     Omega 3 1200 MG CAPS Take by mouth.     Red Yeast Rice Extract (RED YEAST RICE PO) Take by mouth.     SYNTHROID 125 MCG tablet Take 1 tablet (125 mcg total) by mouth daily before breakfast. 90 tablet 3  No current facility-administered medications on file prior to visit.   Allergies  Allergen Reactions   Sulfa Antibiotics Swelling    Tongue swelling, throat irritated   Adhesive [Tape] Other (See Comments)    "skin burn"   Other Swelling and Other (See Comments)    Magic mouth wash/ causes swelling of the tongue   Relpax [Eletriptan Hydrobromide] Other (See Comments)    Stroke-like symptoms   Topiramate Other (See Comments)   Ultram [Tramadol] Other (See Comments)    2004--  Per pt had seizure due to interaction with other medication she was taking at the time   Family History  Problem Relation Age of Onset   Diabetes Mother    Skin cancer Mother    Hyperlipidemia Mother    Breast cancer Mother        Right   Hypertension Father    Asthma Father    Skin cancer Maternal Aunt    Multiple myeloma Maternal Aunt    Testicular cancer Maternal Uncle    Hyperlipidemia Maternal Grandmother    AAA (abdominal aortic aneurysm) Maternal Grandmother    COPD Maternal Grandmother    Prostate cancer Maternal Grandfather    AAA (abdominal aortic aneurysm) Maternal Grandfather    Kidney failure Maternal Grandfather    Leukemia Other    Diabetes Other    PE: BP 110/70 (BP Location: Right Arm, Patient Position: Sitting, Cuff Size: Normal)    Pulse (!) 102   Ht '5\' 6"'  (1.676 m)   Wt 163 lb 6.4 oz (74.1 kg)   LMP 05/17/2018 (Exact Date)   SpO2 97%   BMI 26.37 kg/m  Wt Readings from Last 3 Encounters:  04/17/22 163 lb 6.4 oz (74.1 kg)  02/07/22 164 lb (74.4 kg)  10/10/21 164 lb (74.4 kg)   Constitutional: Slightly overweight, in NAD Eyes:  EOMI, no exophthalmos ENT: no neck masses, no cervical lymphadenopathy Cardiovascular: tachycardia, RR, No MRG Respiratory: CTA B Musculoskeletal: no deformities Skin:no rashes Neurological: no tremor with outstretched hands  ASSESSMENT: 1. Postablative Hypothyroidism  PLAN:  1. Patient with longstanding post ablative hypothyroidism, on brand-name Synthroid. -Her hypothyroidism remains uncontrolled.  She previously had higher TSH levels due to missing Synthroid doses.  After starting to take it correctly TFTs were normal, however, today, I received her latest TSH level result from Dr. Ronita Hipps, and this was higher, at 5.93.  She missed few doses of Synthroid while at the beach. - she continues on Synthroid d.a.w. 125 mcg daily - pt feels good on this dose but has chronic fatigue.  She also had hot flashes after her TAH + BSO, for which she is on estrogen. - we discussed about taking the thyroid hormone every day, with water, >30 minutes before breakfast, separated by >4 hours from acid reflux medications, calcium, iron, multivitamins. Pt. is taking it correctly. - I suggested to continue on the same Synthroid dose, take it every day, and return for labs in 1.5 months: TSH and fT4 -she prefers to get these in Dr. Kennith Maes office, where she works - If labs are abnormal, she will need to return for repeat TFTs in 1.5 months  - OTW, I will see her back in 6 months  Philemon Kingdom, MD PhD [facility]

## 2022-04-17 NOTE — Patient Instructions (Signed)
Please take the Synthroid 125 mcg daily and return for labs in 5-6 weeks.  Take the thyroid hormone every day, with water, at least 30 minutes before breakfast, separated by at least 4 hours from: - acid reflux medications - calcium - iron - multivitamins  Please return in 1 year.

## 2022-06-20 ENCOUNTER — Encounter: Payer: Self-pay | Admitting: Radiation Oncology

## 2022-07-16 ENCOUNTER — Other Ambulatory Visit: Payer: Self-pay | Admitting: Internal Medicine

## 2022-07-24 ENCOUNTER — Ambulatory Visit: Payer: Self-pay | Admitting: Radiation Oncology

## 2022-08-07 ENCOUNTER — Encounter: Payer: Self-pay | Admitting: Internal Medicine

## 2022-08-18 NOTE — Progress Notes (Addendum)
Kelly Mcclure is here today for follow up post radiation to the pelvic.  They completed their radiation on:  07/04/18 to 09/27/18  Does the patient complain of any of the following:  Pain: Yes ,in her upper abdominal area. Abdominal bloating: Some Diarrhea/Constipation:  None Nausea/Vomiting: None Vaginal Discharge: None Blood in Urine or Stool: None Urinary Issues (dysuria/incomplete emptying/ incontinence/ increased frequency/urgency): None Does patient report using vaginal dilator 2-3 times a week and/or sexually active 2-3 weeks: States that she is not using the dilator. Post radiation skin changes: None   Additional comments if applicable: Patient had umbilical repair surgery December 12,2023 Vitals:   08/25/22 0956  BP: (!) 124/99  Pulse: 100  Resp: 20  SpO2: 100%  Weight: 75.3 kg

## 2022-08-24 NOTE — Progress Notes (Signed)
Radiation Oncology         [phone] ________________________________  Name: Kelly Mcclure MRN: [id number]  Date: 08/25/2022  DOB: 11-21-1980  Follow-Up Visit Note  CC: Kelly Few, MD  Kelly Amber, MD    ICD-10-CM   1. 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 07/14/2018 - 08/19/2018 / Pelvis; 25 fractions of 1.8 Gy for a total of 45 Gy 2. 08/20/2018 - 08/31/2018 / Pelvis (nodal boost) ; 8 fractions of 1.8 Gy for a total of 14.4 Gy (59.4 Gy cumulative to involved nodes) 3. 09/06/2018, 09/15/2018, 09/22/2018, 09/27/2018 / Vagina; 6 Gy in 4 fractions for a total dose of 24 Gy (brachytherapy treatment)  Narrative:  The patient returns today for routine follow-up. She was last seen here for follow-up on 08/01/21. Since her last visit, the patient followed up with Dr. Berline Mcclure on 08/23/21. During which time, the patient reported several months of scant vaginal bleeding, defined by occasional blood on her tissue paper after voiding. She also reported 1 episode of passing a dime sized clot and occasional menstrual like cramping. Pelvic exam performed during this visit revealed a moderately atrophic vaginal mucosa and a shortened vagina. No lesions or masses were otherwise noted, however petechiae was appreciated over the upper surfaces of the vagina, likely related to atrophy and radiation changes. Pelvic exam was otherwise unremarkable.   Vaginal biopsy collected by Dr. Berline Mcclure on 08/23/21 showed no evidence of malignancy and findings consisting of atrophic squamous mucosa with fibrosis and chronic inflammation.    CT of the abdomen and pelvis with contrast performed on 09/20/21 showed no evidence of recurrent disease.   During her most recent follow-up visit with Dr. Berline Mcclure on 02/07/22, the patient denied any  further bleeding or cramping and was noted to be NED on examination.   On evaluation today the patient reports some upper abdominal pain.  She did undergo surgery for a umbilical hernia in December which would explain this discomfort.  This continues to slowly improve.  She occasionally will have mild episodes of bloating but this issue has improved over the past Mcclure months.  She occasionally will have scant vaginal bleeding noticed after wiping.  No clots or pelvic pain.  She denies any hematuria or rectal bleeding.  She is using her dilator very intermittently at this time.  This is difficult for her to use given her curious 42-year-old at home and for her full-time work.  She does have vaginal estrogen but is currently not using.  She did try an estrogen patch but this caused a lot of skin irritation.                        Allergies:  is allergic to sulfa antibiotics, adhesive [tape], other, relpax [eletriptan hydrobromide], topiramate, and ultram [tramadol].  Meds: Current Outpatient Medications  Medication Sig Dispense Refill   LamoTRIgine 200 MG TB24 24 hour tablet Take 2 tablets (400 mg total) by mouth 2 (two) times daily. 360 tablet 4   levETIRAcetam (KEPPRA) 750 MG tablet Take 2 tablets (1,500 mg total) by mouth 2 (two) times daily. 360 tablet 4   levocetirizine (XYZAL) 5 MG tablet Take 5 mg by mouth every evening.     Omega 3 1200 MG CAPS Take by mouth.  Red Yeast Rice Extract (RED YEAST RICE PO) Take by mouth.     SYNTHROID 125 MCG tablet TAKE 1 TABLET BY MOUTH DAILY BEFORE BREAKFAST. 90 tablet 3   No current facility-administered medications for this encounter.    Physical Findings: The patient is in no acute distress. Patient is alert and oriented.  weight is 166 lb (75.3 kg). Her blood pressure is 124/99 (abnormal) and her pulse is 100. Her respiration is 20 and oxygen saturation is 100%. .  Lungs are clear to auscultation bilaterally. Heart has regular rate and rhythm. No  palpable cervical, supraclavicular, or axillary adenopathy. Abdomen soft, non-tender, normal bowel sounds.  Small scar noted along umbilicus.  Laparoscopic ports also noted no signs of drainage or infection.  No inguinal adenopathy.  On pelvic examination the external genitalia were unremarkable. A speculum exam was performed.  The vaginal vault is narrowed and foreshortened.  Exam does permit index finger.  Some adhesions noted along the proximal vagina.  Significant vaginal atrophy is noted and the mucosa bleeds easily with exam no mucosal lesions noted or suspicious palpable induration.  On bimanual examination no pelvic masses appreciated.  Rectovaginal exam confirms.  Lab Findings: Lab Results  Component Value Date   WBC 10.2 12/30/2020   HGB 13.8 12/30/2020   HCT 43.0 12/30/2020   MCV 82.5 12/30/2020   PLT 255 12/30/2020    Radiographic Findings: No results found.  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  Encouraged her to use her vaginal dilator especially with her young age and potential for sexual function at a later date.  She does have some upper abdominal pain but this could be explained by her recent surgery.  Plan: She will follow-up with Dr. Berline Mcclure in July of this year.  Routine follow-up in radiation oncology in 1 year.   25 minutes of total time was spent for this patient encounter, including preparation, face-to-face counseling with the patient and coordination of care, physical exam, and documentation of the encounter. ____________________________________  Blair Promise, PhD, MD  This document serves as a record of services personally performed by Gery Pray, MD. It was created on his behalf by Roney Mans, a trained medical scribe. The creation of this record is based on the scribe's personal observations and the provider's statements to  them. This document has been checked and approved by the attending provider.

## 2022-08-25 ENCOUNTER — Ambulatory Visit
Admission: RE | Admit: 2022-08-25 | Discharge: 2022-08-25 | Disposition: A | Discharge status: Home / Self Care | Payer: 59 | Source: Ambulatory Visit | Attending: Radiation Oncology | Admitting: Radiation Oncology

## 2022-08-25 ENCOUNTER — Encounter: Payer: Self-pay | Admitting: Radiation Oncology

## 2022-08-25 ENCOUNTER — Other Ambulatory Visit: Payer: Self-pay

## 2022-08-25 VITALS — BP 124/99 | HR 100 | Resp 20 | Wt 166.0 lb

## 2022-08-25 DIAGNOSIS — Z79899 Other long term (current) drug therapy: Secondary | ICD-10-CM | POA: Diagnosis not present

## 2022-08-25 DIAGNOSIS — Z923 Personal history of irradiation: Secondary | ICD-10-CM | POA: Insufficient documentation

## 2022-08-25 DIAGNOSIS — C52 Malignant neoplasm of vagina: Secondary | ICD-10-CM

## 2022-08-25 DIAGNOSIS — R1010 Upper abdominal pain, unspecified: Secondary | ICD-10-CM | POA: Diagnosis not present

## 2022-08-25 DIAGNOSIS — Z8589 Personal history of malignant neoplasm of other organs and systems: Secondary | ICD-10-CM | POA: Diagnosis not present

## 2022-12-18 ENCOUNTER — Telehealth: Payer: Self-pay | Admitting: *Deleted

## 2022-12-18 NOTE — Telephone Encounter (Signed)
Patient called and scheduled a follow up appt with Dr Pricilla Holm on 6/14 at 3:30 pm

## 2023-01-15 ENCOUNTER — Encounter: Payer: Self-pay | Admitting: Gynecologic Oncology

## 2023-01-16 ENCOUNTER — Inpatient Hospital Stay: Payer: 59 | Attending: Gynecologic Oncology | Admitting: Gynecologic Oncology

## 2023-01-16 ENCOUNTER — Encounter: Payer: Self-pay | Admitting: Gynecologic Oncology

## 2023-01-16 ENCOUNTER — Other Ambulatory Visit: Payer: Self-pay

## 2023-01-16 VITALS — BP 117/82 | HR 95 | Temp 98.2°F | Ht 66.54 in | Wt 166.0 lb

## 2023-01-16 DIAGNOSIS — Z9221 Personal history of antineoplastic chemotherapy: Secondary | ICD-10-CM | POA: Diagnosis not present

## 2023-01-16 DIAGNOSIS — N951 Menopausal and female climacteric states: Secondary | ICD-10-CM

## 2023-01-16 DIAGNOSIS — C52 Malignant neoplasm of vagina: Secondary | ICD-10-CM

## 2023-01-16 DIAGNOSIS — Z90710 Acquired absence of both cervix and uterus: Secondary | ICD-10-CM | POA: Insufficient documentation

## 2023-01-16 DIAGNOSIS — Z8544 Personal history of malignant neoplasm of other female genital organs: Secondary | ICD-10-CM | POA: Diagnosis present

## 2023-01-16 DIAGNOSIS — Z923 Personal history of irradiation: Secondary | ICD-10-CM | POA: Insufficient documentation

## 2023-01-16 DIAGNOSIS — Z9079 Acquired absence of other genital organ(s): Secondary | ICD-10-CM | POA: Insufficient documentation

## 2023-01-16 MED ORDER — ESTRADIOL 0.1 MG/24HR TD PTWK: 0.1000 mg | MEDICATED_PATCH | TRANSDERMAL | 12 refills | Status: DC

## 2023-01-16 NOTE — Patient Instructions (Signed)
It was good to see you today.  I do not see or feel any evidence of cancer recurrence on your exam.  I will see you for follow-up in 12 months. You will see Dr. Roselind Messier in 6 months. Please call my office after you see him to schedule a visit to see me in one year.  Please let me know if you have any issues with the adhesive with the new patch.  As always, if you develop any new and concerning symptoms before your next visit, please call to see me sooner.

## 2023-01-16 NOTE — Progress Notes (Signed)
Gynecologic Oncology Return Clinic Visit  01/16/23  Reason for Visit: Surveillance visit in the setting of a history of vaginal cancer   Treatment History: Oncology History  Vaginal cancer (HCC)  03/01/2013 Surgery   OPERATIVE REPORT   PREOPERATIVE DIAGNOSIS: High-grade dysplasia on Pap smear   POSTOPERATIVE DIAGNOSIS: Same   PROCEDURE: Exam under anesthesia, endocervical curettage, vaginal biopsies   SURGEON: De Blanch, M.D  SURGICAL FINDINGS: Examination under anesthesia revealed a cervix which was flush with the vaginal fornices. The cervical canal was stenotic but once entered and dilated the uterus sounded approximate 7 cm anteriorly. Apply Lugol solution to the cervix showed no lesions. There were 2 areas of non-uptake of Lugol's in the posterior vagina which were biopsied   11/09/2016 Surgery   Preoperative diagnosis: Short cervix S/P cervical conization for microinvasive cervical cancer, intrauterine pregnancy at 11 weeks   Postoperative diagnosis: Short cervix S/P cervical conization for microinvasive cervical cancer, intrauterine pregnancy at 11 weeks   Procedure: Laparoscopy, transabdominal cervical isthmic cerclage x 2, intraoperative ultrasound guidance   Anesthesia: Gen. Endotracheal   Surgeon:Tamer April Manson, MD Findings: On exam under anesthesia the cervix was closed and deviated to the left. On laparoscopy the liver, gallbladder, and appendix were normal.  The uterus was gravid and retroverted and retroflexed, both tubes and ovaries appeared normal.   Intraoperative ultrasound transabdominally and transvaginally showed a singleton intrauterine pregnancy at length consistent with 11 weeks. There was fetal cardiac activity and movements.     01/19/2018 Pathology Results   Cervical biopsy showed high grade squamous intraepithelial with severe dysplasia   02/09/2018 Pathology Results   Cervix, biopsy - SUPERFICIAL FRAGMENTS OF CERVICAL MUCOSA SHOWING AT  LEAST HIGH GRADE SQUAMOUS INTRAEPITHELIAL LESION (HSIL, CIN-III). - DEFINITIVE INVASION IS NOT SEEN BUT CANNOT BE ENTIRELY RULED OUT.   02/18/2018 Pathology Results   1. Cervix, cone - BENIGN SQUAMOUS AND ENDOCERVICAL MUCOSA, SEE COMMENT. - NO DYSPLASIA OR MALIGNANCY. 2. Endocervix, curettage - BLOOD. - NO MUCOSA PRESENT. Microscopic Comment 1. p16 is negative in the squamous epithelium   02/18/2018 Surgery   Preop Diagnosis: CIN III   Postoperative Diagnosis: same   Surgery: cold knife conization of cervix    Surgeons:  Quinn Axe, MD Pathology: cervical cone with marking stitch at 12 o'clock, post cone ECC   Operative findings: no grossly visible exophytic lesions on ectocervix. Cervix flush with vagina.     03/11/2018 Pathology Results   Vagina, biopsy, posterior upper vaginal wall - HIGH GRADE SQUAMOUS DYSPLASIA, SEE COMMENT. Microscopic Comment There are detached squamous fragments with high grade dysplastic features. Several of the fragments have a minimal amount of attached stroma which is inflamed and focally fibrotic. While an invasive component cannot be ruled out, the limited stroma present hampers evaluate for invasion   04/27/2018 Pathology Results   Vagina, biopsy, posterior - SUPERFICIAL FRAGMENTS OF EXTENSIVE SQUAMOUS CELL CARCINOMA IN SITU. - SEE NOTE. Diagnosis Note Though focally suspicious, definitive evidence of invasive carcinoma is not seen in the submitted biopsies. The submitted fragments are superficial in nature and a deeper, more severe process cannot be ruled out. Dr Berneice Heinrich has reviewed this case and concurs with the above interpretation.   06/01/2018 Pathology Results   Uterus +/- tubes/ovaries, neoplastic, upper 1/3 of vagina - INVASIVE MODERATELY DIFFERENTIATED SQUAMOUS CELL CARCINOMA, 3.0 CM, INVOLVING POSTERIOR WALL OF UPPER THIRD OF VAGINA. - CARCINOMA INVOLVES THE CAUTERIZED DEEP RESECTION MARGIN; MUCOSAL MARGINS ARE NOT INVOLVED. -  LYMPHOVASCULAR INVASION IS PRESENT. - UTERUS WITH  BENIGN PROLIFERATIVE ENDOMETRIUM. - BENIGN UNREMARKABLE CERVIX. - BENIGN UNREMARKABLE BILATERAL FALLOPIAN TUBE. - SEE ONCOLOGY TABLE. - SEE NOTE. Microscopic Comment VAGINA: Resection Procedure: Partial vaginectomy with total hysterectomy and bilateral salpingectomy. Tumor Site: Posterior wall of upper third of vagina. Tumor Size: 3.0 cm. Histologic Type: Squamous cell carcinoma. Histologic Grade: G2: Moderately differentiated. Other Tissue/ Organ: Uterus and bilateral fallopian tubes are not involved by carcinoma. Margins: Deep cauterized margin is positive for carcinoma. Lymphovascular Invasion: Present. Regional Lymph Nodes: No lymph nodes submitted or found. Number of Lymph Nodes Examined: 0. Pathologic Stage Classification (pTNM, AJCC 8th Edition): pT1b, pNX. Representative Tumor Block: 1C.  Lesion is HPV positive   06/01/2018 Surgery   Pre-operative Diagnosis: history of cervical cancer, VAIN III of upper posterior vagina   Post-operative Diagnosis: same   Operation: Robotic-assisted laparoscopic total hysterectomy with bilateral salpingectomy with upper vaginectomy and oophoropexy   Surgeon: Quinn Axe   Operative Findings:  : 6cm uterus with normal tubes and ovaries (benign appearing functional cyst on left ovary measuring 2cm). Cerclage (merseline) in place with knot anteriorally. Bladder flap adhesions. 3x3cm discoid raised erythematous lesion on posterior upper wall of vagina with close proximity to cervix. No visible lesion remaining in vagina at completion of procedure.     06/22/2018 Imaging   Ct imaging 1. Status post total abdominal hysterectomy. No definitive findings to strongly suggest metastatic disease to the chest, abdomen or pelvis. 2. 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 Further evaluation with nonemergent MRI of the abdomen with  and without IV gadolinium is strongly recommended to provide definitive characterization of this lesion in the near future. 3. Two small pulmonary nodules measuring 5 mm or less in size, nonspecific but statistically likely benign. Attention on routine follow-up studies is recommended to ensure their stability. 4. 4 mm nonobstructive calculus in the upper pole collecting system of the right kidney. No ureteral stones or findings of urinary tract obstruction are noted at this time   07/02/2018 PET scan   1. Hypermetabolic right internal iliac lymph nodes, consistent with metastatic disease. 2. Hypermetabolic cervical lymph nodes bilaterally. This would be atypical metastatic spread for the reported history of vaginal cancer. The patient does have diffuse, but symmetric hypermetabolic uptake in the nasopharynx and hypopharynx. Infectious/inflammatory etiology a consideration although neoplasm not excluded. 3. Hypermetabolic FDG accumulation in the right ovary, nonspecific with no gross mass lesion evident. Left ovary is high in the left pelvis with low level FDG uptake. 4. Tiny bilateral pulmonary nodules as seen on previous diagnostic CT. These are below threshold size for reliable resolution on PET imaging.     07/08/2018 Cancer Staging   Staging form: Vagina, AJCC 7th Edition - Pathologic: Stage III (T1, N1, cM0) - Signed by Artis Delay, MD on 07/08/2018   07/12/2018 Procedure   Technically successful right IJ power-injectable port catheter placement. Ready for routine use.   07/14/2018 - 09/27/2018 Radiation Therapy   Radiation treatment dates:   1. 07/14/18 - 08/19/18                                                  2. 08/20/18-08/31/18  3. 09/06/18, 09/15/18, 09/22/18, 09/27/18   Site/dose: 1. Pelvis; 25 fractions of 1.8 Gy for a total of 45 Gy                    2. Pelvis (nodal boost) ; 8 fractions of 1.8 Gy for a total of 14.4 Gy (59.4 Gy cumulative  to involved nodes)                    3.  Vagina; 6 Gy in 4 fractions for a total dose of 24 Gy   Beams/energy: 1. IMRT Photon; 6X                            2. IMRT Photon; 6X                            3. HDR Ir-Vaginal, Iridium HDR, patient was treated with a 2.5 cm diameter segmented cylinder, Rx length of 3.5 cm, prescription was 6 gray to the mucosal surface   07/16/2018 - 08/27/2018 Chemotherapy   She received weekly cisplatin   12/28/2018 PET scan   1. 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 2. No evidence of residual or recurrent hypermetabolic disease. 3. Similar bilateral pulmonary nodules, below PET resolution   01/14/2019 Procedure   Removal of implanted Port-A-Cath utilizing sharp and blunt dissection. The procedure was uncomplicated    Patient had a history of stage IA1 diagnosed by a LEEP and repeat CKC on 04/01/2010.  Fertility preservation was chosen.  The patient was subsequently followed with Pap smears.    She had intermittently abnormal paps smears over the subsequent years with close follow-up and regular colposcopic evaluations.   March 21, 2016 Pap smear showed HGSIL.  Endocervical curettage showed benign endocervical glands.   Pap smear July 15, 2016 showed HGSIL.  Endocervical curettage showed benign endocervical glands.   January 13, 2018 Pap smear showed HGSIL.   An exam by Dr Fermin Schwab on July 9th showed a friable exophytic lesion on the cervix which was biopsied. Final pathology showed "at least CIN III, invasion could not be ruled out". Colpo had been difficult as the cervix was flush with the vagina and the upper vagina was narrowed.   The patient was interested in a definitive hysterectomy procedure however she required an excisional procedure to rule out invasive carcinoma before having this.   On 02/18/18 she underwent cold knife conization of the cervix in the operating room.  Inspection of the cervix with acetic acid application  was grossly unremarkable with no apparent lesions seen.  The upper vagina was also grossly normal.  The cervix was small, fairly flush with the surrounding vagina, however the conization procedure was relatively straightforward.  A 0.9 cm AP dimension by 1.5 cm deep dimension cone was taken without difficulty.  There is minimal bleeding in the operating room. Final pathology revealed no residual dysplasia.   On postoperative examination a friable lesion was seen on the posterior upper vaginal wall measuring approximately 2cm. It was separate to, but abutting, the posterior lip of the cervix. 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    The patient was recommended surgical excision of the lesion, and due to its proximity with the cervix, was recommended hysterectomy (without oophorectomy) at the time of surgery in order to establish adequate margins.  The patient had her surgery scheduled (robotic hysterectomy, upper vaginectomy), however canceled this after she discussed the proposed surgery with her referring doctor, Dr Billy Coast, who had concerns about the surgical plan. Dr Billy Coast requested that Mireille see my partner, Dr Stanford Breed for a second opinion.    On 04/27/18 Inola was evaluated by Dr Stanford Breed who visualized the posterior wall lesion and repeated biopsies (superficial fragments of extensive squamous cell carcinoma in situ).  Given his shared concern that this lesion may harbor occult invasive disease, and due to its proximity to the cervix, he agreed with the surgical plan of hysterectomy (not oophorectomy) with salpingectomy and upper vaginectomy.    On June 01, 2018 the patient underwent a robotic assisted total hysterectomy with upper vaginectomy bilateral salpingectomy and due for pexy.  The surgical procedure was unremarkable and uncomplicated.  Operative findings were significant for a grossly normal uterus and cervix  and tubes and ovaries.  A 3 x 3 cm discoid raised erythematous lesion was visualized vaginally on the posterior upper wall of the vagina with close proximity to the cervix (approximately 7 mm margin to the cervicovaginal junction.  At the completion of the procedure there was no visible remaining lesion in the vagina.  Via intraperitoneal inspection there was no eruption of the tumor through into the intraperitoneal space.  The rectovaginal septum was created with a ease and there was no gross involvement of the anterior rectal wall during surgery.  Inspection of the specimen revealed the margins to be grossly clear of disease.  At the completion of the procedure the ovaries were pexed to the pelvic brim.   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  The carcinoma involved the cauterized deep resection margin representing the rectovaginal septal margin.  Lymphovascular space invasion was present.  The cervix and uterus and fallopian tubes are grossly unremarkable.   Postop PET showed a PET avid Rt pelvic lymph node consistent with metastatic disease (stage IIIC).    She was treated with radiosensitizing weekly CDDP with whole pelvic RT and vaginal brachytherapy (the pelvis received 25 fractions of 1.8 Gy for a total of 45 Gray, there was a pelvic nodal boost of 8 fractions of 1.8 Gray for a total of 14.4 Gy, cumulative dose to involve nodes of 59.4 Gy; the vagina received 6 Gy in 4 fractions for total dose of 24 Gray).  Treatment dates 07-14-18 to 09/27/18.    Repeat PET imaging 3 months post treatment was performed on Dec 28, 2018 and revealed response to therapy of the right pelvic nodal metastases with no increased uptake in the nodal basins and no lymphadenopathy.  There is no evidence of residual recurrent disease.  There was similar findings to the bilateral pulmonary nodules which appeared benign and  stable from prior imaging.   She tolerated treatment well with no major toxicities.   Interval History: The patient reports doing well.  She continues to have very rare vaginal spotting, unchanged from her baseline.  Had her umbilical hernia repaired since her last visit with me.  Notes that abdominal symptoms have improved.  She started a probiotic and endorses normal bowel function.  She denies any abdominal pain.  Denies any urinary symptoms.  Past Medical/Surgical History: Past Medical History:  Diagnosis Date   Cancer (HCC) DX 2011   CERVICAL    Cough    WITH SEASONAL ALLEGIES, NONPRODUCTIVE LAST 2 DAYS  H/O radioactive iodine thyroid ablation 12/2009   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 Start laparoscopically HOLD OPEN ABDOMINAL ITEMS    CERCLAGE LAPAROSCOPIC ABDOMINAL N/A 11/19/2016   Procedure: CERCLAGE LAPAROSCOPIC ABDOMINAL;  Surgeon: Fermin Schwab, MD;  Location: [facility];  Service: Gynecology;  Laterality: N/A;   CERVICAL CONIZATION W/BX  N/A 02/18/2018   Procedure: COLD KNIFE CONIZATION OF CERVIX WITH POSSIBLE BIOPSY;  Surgeon: Adolphus Birchwood, MD;  Location: [facility] [place];  Service: Gynecology;  Laterality: N/A;   CESAREAN SECTION N/A 05/27/2017   Procedure: Primary CESAREAN SECTION;  Surgeon: Olivia Mackie, MD;  Location: [facility];  Service: Obstetrics;  Laterality: N/A;  EDD: 11/4/18Allergy: Adhesive, Relpax, Sulfa, Ultram   COLD KNIFE CONIZATION  03-29-2010   dr Billy Coast  [facility]   DILATION AND CURETTAGE OF UTERUS  2009   DILATION AND CURETTAGE OF UTERUS N/A 03/01/2013   Procedure: CERVICAL DILATATION AND ENDOCERVICAL CURRETAGE AND VAAGINAL BIOPIES;  Surgeon: Jeannette Corpus, MD;  Location: [facility];  Service: Gynecology;  Laterality: N/A;   EXTRACORPOREAL SHOCK WAVE LITHOTRIPSY Right 05/10/2020   Procedure: EXTRACORPOREAL SHOCK WAVE LITHOTRIPSY (ESWL);  Surgeon: Noel Christmas, MD;  Location: [facility];  Service: Urology;  Laterality: Right;   IR IMAGING GUIDED PORT INSERTION  07/12/2018   IR REMOVAL TUN ACCESS W/ PORT W/O FL MOD SED  01/14/2019   LEEP  02/25/2010;  12/ 2012  dr Billy Coast office   NASAL SEPTUM SURGERY  2006   Repair of deviated septum   SIGMOIDOSCOPY  2009   TONSILLECTOMY AND ADENOIDECTOMY  1995    Family History  Problem Relation Age of Onset   Diabetes Mother    Skin cancer Mother    Hyperlipidemia Mother    Breast cancer Mother        Right   Hypertension Father    Asthma Father    Skin cancer Maternal Aunt    Multiple myeloma Maternal Aunt    Testicular cancer Maternal Uncle    Hyperlipidemia Maternal Grandmother    AAA (abdominal aortic aneurysm) Maternal Grandmother    COPD Maternal Grandmother    Prostate cancer Maternal Grandfather    AAA (abdominal aortic aneurysm) Maternal Grandfather    Kidney failure Maternal Grandfather    Leukemia Other    Diabetes Other     Social History   Socioeconomic History   Marital status: Single    Spouse name:  Not on file   Number of children:  1   Years of education: Not on file   Highest education level: Not on file  Occupational History   Occupation: Triage  Tobacco Use   Smoking status: Never   Smokeless tobacco: Never  Vaping Use   Vaping Use: Never used  Substance and Sexual Activity   Alcohol use: Not Currently    Comment: SELDOM, NONE IN  10 MONTHS   Drug use: No   Sexual activity: Yes    Birth control/protection: Surgical  Other Topics Concern   Not on file  Social History Narrative   Not on file   Social Determinants of Health   Financial Resource Strain: Not on file  Food Insecurity: Not on file  Transportation Needs: Not on file  Physical Activity: Not on file  Stress: Not on file  Social Connections: Not on file    Current Medications:  Current Outpatient Medications:    estradiol (CLIMARA) 0.1 mg/24hr patch, Place 1 patch (0.1 mg total) onto the skin once a week., Disp: 4 patch, Rfl: 12   LamoTRIgine 200 MG TB24 24 hour tablet, Take 2 tablets (400 mg total) by mouth 2 (two) times daily., Disp: 360 tablet, Rfl: 4   levETIRAcetam (KEPPRA) 750 MG tablet, Take 2 tablets (1,500 mg total) by mouth 2 (two) times daily., Disp: 360 tablet, Rfl: 4   levocetirizine (XYZAL) 5 MG tablet, Take 5 mg by mouth every evening., Disp: , Rfl:    Omega 3 1200 MG CAPS, Take by mouth., Disp: , Rfl:    phentermine (ADIPEX-P) 37.5 MG tablet, Take 37.5 mg by mouth every morning., Disp: , Rfl:    Red Yeast Rice Extract (RED YEAST RICE PO), Take by mouth., Disp: , Rfl:    SYNTHROID 125 MCG tablet, TAKE 1 TABLET BY MOUTH DAILY BEFORE BREAKFAST., Disp: 90 tablet, Rfl: 3  Review of Systems: + hot flashes Denies appetite changes, fevers, chills, fatigue, unexplained weight changes. Denies hearing loss, neck lumps or masses, mouth sores, ringing in ears or voice changes. Denies cough or wheezing.  Denies shortness of breath. Denies chest pain or palpitations. Denies leg swelling. Denies  abdominal distention, pain, blood in stools, constipation, diarrhea, nausea, vomiting, or early satiety. Denies pain with intercourse, dysuria, frequency, hematuria or incontinence. Denies pelvic pain, vaginal bleeding or vaginal discharge.   Denies joint pain, back pain or muscle pain/cramps. Denies itching, rash, or wounds. Denies dizziness, headaches, numbness or seizures. Denies swollen lymph nodes or glands, denies easy bruising or bleeding. Denies anxiety, depression, confusion, or decreased concentration.  Physical Exam: BP 117/82 (BP Location: Left Arm, Patient Position: Sitting)   Pulse 95   Temp 98.2 F (36.8 C)   Ht 5' 6.54" (1.69 m)   Wt 166 lb (75.3 kg)   LMP 05/17/2018 (Exact Date)   SpO2 96%   BMI 26.36 kg/m  General: Alert, oriented, no acute distress. HEENT: Normocephalic, atraumatic, sclera anicteric. Chest: Clear to auscultation bilaterally.  No wheezes or rhonchi. Cardiovascular: Regular rate and rhythm, no murmurs. Abdomen: soft, nontender.  Normoactive bowel sounds.  No masses or hepatosplenomegaly appreciated.  Small periumbilical hernia. Extremities: Grossly normal range of motion.  Warm, well perfused.  No edema bilaterally. Skin: No rashes or lesions noted. Lymphatics: No cervical, supraclavicular, or inguinal adenopathy. GU: Normal appearing external genitalia without erythema, excoriation, or lesions.  Speculum exam reveals moderately atrophic vaginal mucosa with shortened vagina, no lesions or masses noted but there is petechiae over the upper surfaces of the vagina, likely related to atrophy and  radiation changes.  Bimanual exam reveals shortened vagina, smooth, no nodularity.  Pap test and HPV collected. Rectovaginal exam confirms these findings.  Laboratory & Radiologic Studies: None new  Assessment & Plan: HIBAQ NAKAO is a 42 y.o. woman with  history of stage IIIC (posterior) vaginal cancer diagnosed and resected on 06/01/18. Positive deep  margin (rectovaginal septum).  S/p adjuvant whole pelvic RT with vaginal brachytherapy and radiosensitizing CDDP completed February, 2020. Complete clinical response on post-treatment PET and physical exam. Last pap test in 02/2022 and negative. 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 Vaginal narrowing and atrophy secondary to radiation and menopause.   Overall, patient is doing well.  She is NED on exam today.    She continues to have rare spotting.  Atrophy and radiation changes noted on her exam as well as some small petechia, likely the source of her infrequent and small bleeding.  Pap and HPV collected today. I will contact her with these results.  GI symptoms significantly better.  Has not been on hormone replacement therapy.  Given her symptoms of hot flashes, I discussed trying to restart HRT.  She had previously had a reaction to the adhesive of Vivelle-Dot patch.  She was then transition to a pill but notes that she is not very good at remembering to take a pill.  I suggested we try a Climara patch.  I have asked her to call me if she has difficulty with the adhesive again.   Per NCCN surveillance recommendations, we will continue with visits every 6 months alternating between our office and radiation oncology.  The patient is seeing Dr. Roselind Messier in December.  I have asked her to call back after the new year to schedule a visit to see me in a year.  We reviewed signs and symptoms that should prompt a phone call sooner than that.  20 minutes of total time was spent for this patient encounter, including preparation, face-to-face counseling with the patient and coordination of care, and documentation of the encounter.  Eugene Garnet, MD  Division of Gynecologic Oncology  Department of Obstetrics and Gynecology  [facility]

## 2023-01-26 LAB — CYTOLOGY - PAP
Comment: NEGATIVE
Diagnosis: NEGATIVE
Diagnosis: REACTIVE
High risk HPV: NEGATIVE

## 2023-04-20 ENCOUNTER — Encounter: Payer: Self-pay | Admitting: Internal Medicine

## 2023-04-20 ENCOUNTER — Ambulatory Visit (INDEPENDENT_AMBULATORY_CARE_PROVIDER_SITE_OTHER): Payer: 59 | Admitting: Internal Medicine

## 2023-04-20 VITALS — BP 120/80 | HR 111 | Ht 66.54 in | Wt 146.0 lb

## 2023-04-20 DIAGNOSIS — E890 Postprocedural hypothyroidism: Secondary | ICD-10-CM | POA: Diagnosis not present

## 2023-04-20 LAB — TSH: TSH: 0.26 u[IU]/mL — ABNORMAL LOW (ref 0.35–5.50)

## 2023-04-20 LAB — T4, FREE: Free T4: 1.05 ng/dL (ref 0.60–1.60)

## 2023-04-20 MED ORDER — SYNTHROID 112 MCG PO TABS: 112.0000 ug | ORAL_TABLET | Freq: Every day | ORAL | 3 refills | Status: DC

## 2023-04-20 NOTE — Patient Instructions (Addendum)
Please take the Synthroid 125 mcg daily.  Take the thyroid hormone every day, with water, at least 30 minutes before breakfast, separated by at least 4 hours from: - acid reflux medications - calcium - iron - multivitamins  Please stop at the lab.  Please return in 1 year, but possibly sooner for labs.

## 2023-04-20 NOTE — Progress Notes (Signed)
Patient ID: Kelly Mcclure, female   DOB: [birth year], 42 y.o.   MRN: [id number]   HPI  Kelly Mcclure is a 42 y.o.-year-old very pleasant female, initially referred by her ObGyn Dr., Dr. 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 Last visit 6 months ago.  Interim history: Also, she continues to have hair loss - now at baseline; but the hot flashes resolved.  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 She was started on Synthroid soon after.  She had a thyroid uptake and scan >> normal uptake but again demonstrated hot nodule on scan >> RAI treatment in 12/2009 (Dr. Talmage Nap), after which she developed hypothyroidism >> started on levothyroxine, but currently on d.a.w. Synthroid.    She gave birth in 2018.  During her pregnancy she was on 175 mcg Synthroid.  Afterwards, we decreased the dose gradually to 125 mcg daily.  Pt is on Synthroid 125 mcg daily: - in am - fasting - coffee + creamer >45-60 min later - at least 30 min from b'fast - no Ca, Fe, PPIs - MVI later - mostly at night - not on Biotin  Reviewed patient's TFTs: 10/15/2022: TSH 1.82 08/01/2022: TSH 2.05 04/15/2022: TSH 5.93 (0.4-4) 10/09/2021: TSH 2.31 04/05/2021: TSH 0.06, free T4 1.91 (0.82-1.77) 12/28/2020: TSH 27.5 Lab Results  Component Value Date   TSH 1.14 04/05/2020   TSH 0.15 (L) 04/14/2019   TSH 0.80 03/24/2018   TSH 5.78 (H) 03/20/2017   TSH 5.27 01/16/2017   FREET4 0.83 04/05/2020   FREET4 1.12 04/14/2019   FREET4 0.99 03/24/2018   FREET4 0.91 03/20/2017   Pt denies: - feeling nodules in neck - hoarseness - dysphagia - choking  She has + FH of thyroid disorders in: great aunt. + poss FH of thyroid cancer. No FH of thyroid cancer. No h/o radiation tx to head or neck. No herbal supplements. No Biotin use. No recent steroids use.   She has a history of vaginal cancer diagnosed in 2019 >> she had conization and  cerclage. She had TAH (no BSO) + ChTx and RxTx. She finished 10/2018.  She developed ovarian insufficiency afterwards.  She was recommended to start Vivelle dot >> rash >> po Estradiol >> off now. She has a history of absence seizures.  On Lamictal and Keppra.  She had HAs >> MUCH improved on qod Nurtec (preventative measure). She has a history of hair loss for which I recommended B vitamins in the past but she did not start. He has a history of a kidney stone in 12/2020.   She gave birth in 05/2017 (cesarean section) - baby girl.  ROS: + see HPI  I reviewed pt's medications, allergies, PMH, social hx, family hx, and changes were documented in the history of present illness. Otherwise, unchanged from my initial visit note.  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 Start  laparoscopically HOLD OPEN ABDOMINAL ITEMS    CERCLAGE LAPAROSCOPIC ABDOMINAL N/A 11/19/2016   Procedure: CERCLAGE LAPAROSCOPIC ABDOMINAL;  Surgeon: Fermin Schwab, MD;  Location: [facility];  Service: Gynecology;  Laterality: N/A;   CERVICAL CONIZATION W/BX N/A 02/18/2018   Procedure: COLD KNIFE CONIZATION OF CERVIX WITH POSSIBLE BIOPSY;  Surgeon: Adolphus Birchwood, MD;  Location: [facility] ;  Service: Gynecology;  Laterality: N/A;   CESAREAN SECTION N/A 05/27/2017   Procedure: Primary CESAREAN SECTION;  Surgeon: Olivia Mackie, MD;  Location: [facility];  Service: Obstetrics;  Laterality: N/A;  EDD: 11/4/18Allergy: Adhesive, Relpax, Sulfa, Ultram   COLD KNIFE CONIZATION  03-29-2010   dr Billy Coast  [facility]   DILATION AND CURETTAGE OF UTERUS  2009   DILATION AND CURETTAGE OF UTERUS N/A 03/01/2013   Procedure: CERVICAL DILATATION AND ENDOCERVICAL CURRETAGE AND VAAGINAL BIOPIES;  Surgeon: Jeannette Corpus, MD;  Location: [facility];  Service: Gynecology;  Laterality: N/A;   EXTRACORPOREAL SHOCK WAVE LITHOTRIPSY Right 05/10/2020   Procedure: EXTRACORPOREAL SHOCK WAVE LITHOTRIPSY (ESWL);  Surgeon: Noel Christmas, MD;  Location: [facility];  Service: Urology;  Laterality: Right;   IR IMAGING GUIDED PORT INSERTION  07/12/2018   IR REMOVAL TUN ACCESS W/ PORT W/O FL MOD SED  01/14/2019   LEEP  02/25/2010;  12/ 2012  dr Billy Coast office   NASAL SEPTUM SURGERY  2006   Repair of deviated septum   SIGMOIDOSCOPY  2009   TONSILLECTOMY AND ADENOIDECTOMY  1995   Social History   Social History   Marital status: Engaged    Spouse name: N/A   Number of children: 0   Occupational History   Nurse, adult at [employer]   Social History Main Topics   Smoking status: Never Smoker   Smokeless tobacco: Never Used   Alcohol use Yes     Comment: Rare   Drug use: No   Current Outpatient Medications on File Prior to Visit  Medication Sig Dispense Refill    estradiol (CLIMARA) 0.1 mg/24hr patch Place 1 patch (0.1 mg total) onto the skin once a week. 4 patch 12   LamoTRIgine 200 MG TB24 24 hour tablet Take 2 tablets (400 mg total) by mouth 2 (two) times daily. 360 tablet 4   levETIRAcetam (KEPPRA) 750 MG tablet Take 2 tablets (1,500 mg total) by mouth 2 (two) times daily. 360 tablet 4   levocetirizine (XYZAL) 5 MG tablet Take 5 mg by mouth every evening.     Omega 3 1200 MG CAPS Take by mouth.     phentermine (ADIPEX-P) 37.5 MG tablet Take 37.5 mg by mouth every morning.     Red Yeast Rice Extract (RED YEAST RICE PO) Take by mouth.     SYNTHROID 125 MCG tablet TAKE 1 TABLET BY MOUTH DAILY BEFORE BREAKFAST. 90  tablet 3   No current facility-administered medications on file prior to visit.   Allergies  Allergen Reactions   Sulfa Antibiotics Swelling    Tongue swelling, throat irritated   Adhesive [Tape] Other (See Comments)    "skin burn"   Other Swelling and Other (See Comments)    Magic mouth wash/ causes swelling of the tongue   Relpax [Eletriptan Hydrobromide] Other (See Comments)    Stroke-like symptoms   Topiramate Other (See Comments)   Ultram [Tramadol] Other (See Comments)    2004--  Per pt had seizure due to interaction with other medication she was taking at the time   Family History  Problem Relation Age of Onset   Diabetes Mother    Skin cancer Mother    Hyperlipidemia Mother    Breast cancer Mother        Right   Hypertension Father    Asthma Father    Skin cancer Maternal Aunt    Multiple myeloma Maternal Aunt    Testicular cancer Maternal Uncle    Hyperlipidemia Maternal Grandmother    AAA (abdominal aortic aneurysm) Maternal Grandmother    COPD Maternal Grandmother    Prostate cancer Maternal Grandfather    AAA (abdominal aortic aneurysm) Maternal Grandfather    Kidney failure Maternal Grandfather    Leukemia Other    Diabetes Other    PE: BP 120/80   Pulse (!) 111   Ht 5' 6.54" (1.69 m)   Wt 146 lb (66.2  kg)   LMP 05/17/2018 (Exact Date)   SpO2 97%   BMI 23.18 kg/m  Wt Readings from Last 3 Encounters:  04/20/23 146 lb (66.2 kg)  01/16/23 166 lb (75.3 kg)  08/25/22 166 lb (75.3 kg)   Constitutional: normal weight, in NAD Eyes:  EOMI, no exophthalmos ENT: no neck masses, no cervical lymphadenopathy Cardiovascular: tachycardia, RR, No MRG Respiratory: CTA B Musculoskeletal: no deformities Skin:no rashes Neurological: no tremor with outstretched hands  ASSESSMENT: 1. Postablative Hypothyroidism  PLAN:  1. Patient with longstanding, post ablative hypothyroidism, on brand-name Synthroid. -In the past, she had uncontrolled hypothyroidism due to missing Synthroid doses, but after starting to take this consistently and correctly, TFTs normalized.  Before last visit, she missed several doses of Synthroid while at the beach.  TSH was higher, at 5.93.  We did not change the dose of her Synthroid, and TFTs normalized afterwards with the latest level being from 10/15/2022: TSH 1.82, normal -As expected, TSH is suppressed she continues on Synthroid 125 mcg daily - pt feels good on this dose. She is on Phentermine >> 17 lbs lost since last OV.  We discussed that with such a significant weight loss, we may need to adjust down the dose of Synthroid.  I advised her to let me know before next visit if she continues to lose a significant amount of weight so we can check her TFTs. - Due to the tachycardia, we discussed that the sooner she gets to her target weight, try to stop the medication. - we discussed about taking the thyroid hormone every day, with water, >30 minutes before breakfast, separated by >4 hours from acid reflux medications, calcium, iron, multivitamins. Pt. is taking it correctly. - will check thyroid tests today: TSH and fT4; she prefers to get these in Dr. Jinny Sanders office, where she works, between appointments. - If labs are abnormal, she will need to return for repeat TFTs in 1.5  months - OTW, I will see her back in 1  year  Needs refills.   Component     Latest Ref Rng 04/20/2023  TSH     0.35 - 5.50 uIU/mL 0.26 (L)   T4,Free(Direct)     0.60 - 1.60 ng/dL 5.78   TSH is suppressed >> Will decrease the dose of Synthroid to 112 mcg daily and recheck her tests in 1.5 months.  Carlus Pavlov, MD PhD [facility]

## 2023-06-04 ENCOUNTER — Encounter: Payer: Self-pay | Admitting: Radiation Oncology

## 2023-08-02 NOTE — Progress Notes (Signed)
Radiation Oncology         [phone] ________________________________  Name: Kelly Mcclure MRN: [id number]  Date: 08/03/2023  DOB: [birth year]  Follow-Up Visit Note  CC: Olivia Mackie, MD  Adolphus Birchwood, MD    ICD-10-CM   1. 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 07/14/2018 - 08/19/2018 / Pelvis; 25 fractions of 1.8 Gy for a total of 45 Gy 2. 08/20/2018 - 08/31/2018 / Pelvis (nodal boost) ; 8 fractions of 1.8 Gy for a total of 14.4 Gy (59.4 Gy cumulative to involved nodes) 3. 09/06/2018, 09/15/2018, 09/22/2018, 09/27/2018 / Vagina; 6 Gy in 4 fractions for a total dose of 24 Gy (brachytherapy treatment)  Narrative:  The patient returns today for routine follow-up. She was last seen here for follow-up on 08/25/22.   Since her last visit, she most recently followed up with Dr. Pricilla Holm on 01/16/23. During which time, the patient endorsed occasional (very rare) vaginal spotting, which is unchanged from her baseline. She also reported improvement in her abdominal symptoms and endorsed normal bowel function. Overall, she denied any symptoms concerning for disease recurrence and she was noted as NED one examination.     No other significant oncologic interval history since the patient was last seen for follow-up.   Patient reports to be doing well overall today.  She denies any vaginal bleeding at this time, abnormal discharge, abnormal bloating, or abdominal pain. Uses her vaginal dilators occasionally.  She reports being on a estrogen combination patch at this time.  Allergies:  is allergic to sulfa antibiotics, adhesive [tape], other, relpax [eletriptan hydrobromide], topiramate, and ultram [tramadol].  Meds: Current Outpatient Medications  Medication Sig Dispense Refill   LamoTRIgine 200 MG TB24 24 hour  tablet Take 2 tablets (400 mg total) by mouth 2 (two) times daily. 360 tablet 4   levETIRAcetam (KEPPRA) 750 MG tablet Take 2 tablets (1,500 mg total) by mouth 2 (two) times daily. 360 tablet 4   levocetirizine (XYZAL) 5 MG tablet Take 5 mg by mouth every evening.     Omega 3 1200 MG CAPS Take by mouth.     phentermine (ADIPEX-P) 37.5 MG tablet Take 37.5 mg by mouth every morning.     SYNTHROID 112 MCG tablet Take 1 tablet (112 mcg total) by mouth daily before breakfast. 45 tablet 3   No current facility-administered medications for this encounter.    Physical Findings: The patient is in no acute distress. Patient is alert and oriented.  height is 5' 6.5" (1.689 m) and weight is 148 lb (67.1 kg). Her temporal temperature is 97.7 F (36.5 C). Her blood pressure is 119/81 and her pulse is 113 (abnormal). Her respiration is 18 and oxygen saturation is 99%. .  No significant changes. Lungs are clear to auscultation bilaterally. Heart has regular rate and rhythm. No palpable cervical, supraclavicular, or axillary adenopathy. Abdomen soft, non-tender, normal bowel sounds.  On pelvic examination the external genitalia were unremarkable. A speculum exam was performed.  The vaginal vault is narrowed and foreshortened.  Exam does permit index finger.  Some adhesions noted along the proximal vagina.  Radiation changes noted in the vaginal vault.  Significant vaginal atrophy is noted and the mucosa bleeds easily with exam no mucosal lesions noted or suspicious palpable induration.  On bimanual examination no  pelvic masses appreciated.  Rectovaginal exam confirms.  Overall she tolerated the exam better with less discomfort.   Lab Findings: Lab Results  Component Value Date   WBC 10.2 12/30/2020   HGB 13.8 12/30/2020   HCT 43.0 12/30/2020   MCV 82.5 12/30/2020   PLT 255 12/30/2020    Radiographic Findings: No results found.  Impression: Stage III vaginal cancer (pT1b, cN1, M0 invasive moderately  differentiated squamous cell carcinoma)   The patient is well overall today.  No evidence of disease recurrence on clinical exam today.  Plan:  Per NCCN guidelines, patient will follow up with Dr. Pricilla Holm in 6 months.  At that point, she will be 5 years out from her last treatment.  If she remains disease-free, she can continue annual follow-up with her regular gynecologist.  Appreciate the opportunity to take part in this patient's care. Patient was educated on signs/symptoms that may indicate disease recurrence and understands to notify us if she experiences any of them.    30 minutes of total time was spent for this patient encounter, including preparation, face-to-face counseling with the patient and coordination of care, physical exam, and documentation of the encounter. ____________________________________   Bryan Lemma, PA-C   Billie Lade, PhD, MD  This document serves as a record of services personally performed by Antony Blackbird, MD and Bryan Lemma, PA-C. It was created on his behalf by Neena Rhymes, a trained medical scribe. The creation of this record is based on the scribe's personal observations and the provider's statements to them. This document has been checked and approved by the attending provider.

## 2023-08-03 ENCOUNTER — Ambulatory Visit
Admission: RE | Admit: 2023-08-03 | Discharge: 2023-08-03 | Disposition: A | Discharge status: Home / Self Care | Payer: 59 | Source: Ambulatory Visit | Attending: Radiation Oncology | Admitting: Radiation Oncology

## 2023-08-03 ENCOUNTER — Encounter: Payer: Self-pay | Admitting: Radiation Oncology

## 2023-08-03 VITALS — BP 119/81 | HR 113 | Temp 97.7°F | Resp 18 | Ht 66.5 in | Wt 148.0 lb

## 2023-08-03 DIAGNOSIS — Z79890 Hormone replacement therapy (postmenopausal): Secondary | ICD-10-CM | POA: Insufficient documentation

## 2023-08-03 DIAGNOSIS — Z79899 Other long term (current) drug therapy: Secondary | ICD-10-CM | POA: Insufficient documentation

## 2023-08-03 DIAGNOSIS — Z923 Personal history of irradiation: Secondary | ICD-10-CM | POA: Diagnosis not present

## 2023-08-03 DIAGNOSIS — C52 Malignant neoplasm of vagina: Secondary | ICD-10-CM

## 2023-08-03 DIAGNOSIS — N921 Excessive and frequent menstruation with irregular cycle: Secondary | ICD-10-CM | POA: Insufficient documentation

## 2023-08-03 DIAGNOSIS — Z8589 Personal history of malignant neoplasm of other organs and systems: Secondary | ICD-10-CM | POA: Insufficient documentation

## 2023-08-03 NOTE — Progress Notes (Signed)
Kelly Mcclure is here today for follow up post radiation to the pelvic.  They completed their radiation on: 09/27/2018  Does the patient complain of any of the following:  Pain: Denies Abdominal bloating: Denies Diarrhea/Constipation: Denies Nausea/Vomiting: Denies Vaginal Discharge: Denies Blood in Urine or Stool:  Urinary Issues (dysuria/incomplete emptying/ incontinence/ increased frequency/urgency): Denies Does patient report using vaginal dilator 2-3 times a week and/or sexually active 2-3 weeks: She reports using dilators occasionally. Post radiation skin changes: Denies   BP 119/81 (BP Location: Left Arm, Patient Position: Sitting)   Pulse (!) 113   Temp 97.7 F (36.5 C) (Temporal)   Resp 18   Ht 5' 6.5" (1.689 m)   Wt 148 lb (67.1 kg)   LMP 05/17/2018 (Exact Date)   SpO2 99%   BMI 23.53 kg/m     Additional comments if applicable:

## 2023-08-07 ENCOUNTER — Telehealth: Payer: Self-pay | Admitting: *Deleted

## 2023-08-07 NOTE — Telephone Encounter (Signed)
 Patient called and scheduled appt for follow up on 6/27 with Dr Pricilla Holm

## 2023-08-31 ENCOUNTER — Ambulatory Visit: Payer: Self-pay | Admitting: Radiation Oncology

## 2023-10-15 ENCOUNTER — Encounter: Payer: Self-pay | Admitting: Obstetrics and Gynecology

## 2023-10-20 ENCOUNTER — Encounter: Payer: Self-pay | Admitting: Internal Medicine

## 2023-10-20 ENCOUNTER — Ambulatory Visit (INDEPENDENT_AMBULATORY_CARE_PROVIDER_SITE_OTHER): Payer: 59 | Admitting: Internal Medicine

## 2023-10-20 VITALS — BP 120/70 | HR 120 | Ht 66.5 in | Wt 145.8 lb

## 2023-10-20 DIAGNOSIS — E890 Postprocedural hypothyroidism: Secondary | ICD-10-CM

## 2023-10-20 NOTE — Progress Notes (Signed)
 Patient ID: Kelly Mcclure, female   DOB: 1981/01/11, 43 y.o.   MRN: [id number]   HPI  Kelly Mcclure is a 43 y.o.-year-old very pleasant female, initially referred by her ObGyn Dr., Dr. 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 Last visit 6 months ago.  Interim history: She continues to have some hair loss, but now at baseline.  Her hot flashes resolved. Last night, she had allergic reaction (unknown culprit)  - generalized rash >> resolved with cold water, Benadryl. She decreased her phentermine dose to half since last visit.  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 She was started on Synthroid soon after.  She had a thyroid uptake and scan >> normal uptake but again demonstrated hot nodule on scan >> RAI treatment in 12/2009 (Dr. Talmage Nap), after which she developed hypothyroidism >> started on levothyroxine, but currently on d.a.w. Synthroid.    She gave birth in 2018.  During her pregnancy she was on 175 mcg Synthroid.  Afterwards, we decreased the dose gradually to 125 mcg daily.  In 04/2023, we decreased the dose to 112 mcg daily.  Pt is on Synthroid 112 mcg daily: - in am - fasting - coffee + creamer >45-60 min later - at least 30 min from b'fast - no Ca, Fe, PPIs - MVI later - mostly at night - not on Biotin  Reviewed patient's TFTs: Lab Results  Component Value Date   TSH 0.26 (L) 04/20/2023   TSH 1.14 04/05/2020   TSH 0.15 (L) 04/14/2019   TSH 0.80 03/24/2018   TSH 5.78 (H) 03/20/2017   TSH 5.27 01/16/2017   FREET4 1.05 04/20/2023   FREET4 0.83 04/05/2020   FREET4 1.12 04/14/2019   FREET4 0.99 03/24/2018   FREET4 0.91 03/20/2017  10/15/2022: TSH 1.82 08/01/2022: TSH 2.05 04/15/2022: TSH 5.93 (0.4-4) 10/09/2021: TSH 2.31 04/05/2021: TSH 0.06, free T4 1.91 (0.82-1.77) 12/28/2020: TSH 27.5  Pt denies: - feeling nodules in neck - hoarseness - dysphagia - choking  She  has + FH of thyroid disorders in: great aunt. + poss FH of thyroid cancer. No FH of thyroid cancer. No h/o radiation tx to head or neck. No herbal supplements. No Biotin use. No recent steroids use.   She has a history of vaginal cancer diagnosed in 2019 >> she had conization and cerclage. She had TAH (no BSO) + ChTx and RxTx. She finished 10/2018.  She developed ovarian insufficiency afterwards.  She was recommended to start Vivelle dot >> rash >> po Estradiol >> off now. She has a history of absence seizures.  On Lamictal and Keppra.  She had HAs >> MUCH improved on qod Nurtec (preventative measure). She has a history of hair loss for which I recommended B vitamins in the past but she did not start. He has a history of a kidney stone in 12/2020.   She gave birth in 05/2017 (cesarean section) - baby girl.  ROS: + see HPI  I reviewed pt's medications, allergies, PMH, social hx, family hx, and changes were documented in the history of present illness. Otherwise, unchanged from my initial visit note.  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 Start laparoscopically HOLD OPEN ABDOMINAL ITEMS    CERCLAGE LAPAROSCOPIC ABDOMINAL N/A 11/20/2039   Procedure: CERCLAGE LAPAROSCOPIC ABDOMINAL;  Surgeon: Fermin Schwab, MD;  Location: [facility];  Service: Gynecology;  Laterality: N/A;   CERVICAL CONIZATION W/BX N/A 02/18/2018   Procedure: COLD KNIFE CONIZATION OF CERVIX WITH POSSIBLE BIOPSY;  Surgeon: Adolphus Birchwood, MD;  Location: [facility];  Service: Gynecology;  Laterality: N/A;   CESAREAN SECTION N/A 05/27/2017   Procedure: Primary CESAREAN SECTION;  Surgeon: Olivia Mackie, MD;  Location: [facility];  Service: Obstetrics;  Laterality: N/A;  EDD: 11/4/18Allergy: Adhesive, Relpax, Sulfa, Ultram   COLD KNIFE CONIZATION  03-29-2010   dr Billy Coast  [facility]   DILATION AND CURETTAGE OF UTERUS  2009   DILATION AND CURETTAGE OF UTERUS N/A 03/01/2013   Procedure: CERVICAL DILATATION AND ENDOCERVICAL CURRETAGE AND VAAGINAL BIOPIES;  Surgeon: Jeannette Corpus, MD;  Location: [facility];  Service: Gynecology;  Laterality: N/A;   EXTRACORPOREAL SHOCK WAVE LITHOTRIPSY Right 05/10/2020   Procedure: EXTRACORPOREAL SHOCK WAVE LITHOTRIPSY (ESWL);  Surgeon: Noel Christmas, MD;  Location: [facility];  Service: Urology;  Laterality: Right;   IR IMAGING GUIDED PORT INSERTION  07/12/2018   IR REMOVAL TUN ACCESS W/ PORT W/O FL MOD SED  01/14/2019   LEEP  02/25/2010;  12/ 2012  dr Billy Coast office   NASAL SEPTUM SURGERY  2006   Repair of deviated septum   SIGMOIDOSCOPY  2009   TONSILLECTOMY AND ADENOIDECTOMY  1995   Social History   Social History   Marital status: Engaged    Spouse name: N/A   Number of children: 0   Occupational History   Office manager at [employer]   Social History Main Topics   Smoking status: Never Smoker   Smokeless tobacco: Never Used   Alcohol use Yes     Comment: Rare   Drug use: No   Current Outpatient Medications on File Prior to Visit  Medication Sig Dispense Refill   LamoTRIgine 200 MG TB24 24 hour tablet Take 2 tablets (400 mg total) by mouth 2 (two) times daily. 360 tablet 4   levETIRAcetam (KEPPRA) 750 MG tablet Take 2 tablets (1,500 mg total) by mouth 2 (two) times daily. 360 tablet 4   levocetirizine (XYZAL) 5 MG tablet Take 5 mg by mouth every evening.     Omega 3 1200 MG CAPS Take by mouth.     phentermine (ADIPEX-P) 37.5 MG tablet Take 37.5 mg by mouth every morning.  SYNTHROID 112 MCG tablet Take 1 tablet (112 mcg total) by mouth daily before breakfast. 45 tablet 3   No current facility-administered medications on file prior to visit.   Allergies  Allergen Reactions   Sulfa Antibiotics Swelling    Tongue swelling, throat irritated   Adhesive [Tape] Other (See Comments)    "skin burn"   Other Swelling and Other (See Comments)    Magic mouth wash/ causes swelling of the tongue   Relpax [Eletriptan Hydrobromide] Other (See Comments)    Stroke-like symptoms   Topiramate Other (See Comments)   Ultram [Tramadol] Other (See Comments)    2004--  Per pt had seizure due to interaction with other medication she was taking at the time   Family History  Problem Relation Age of Onset   Diabetes Mother    Skin cancer Mother    Hyperlipidemia Mother    Breast cancer Mother        Right   Hypertension Father    Asthma Father    Skin cancer Maternal Aunt    Multiple myeloma Maternal Aunt    Testicular cancer Maternal Uncle    Hyperlipidemia Maternal Grandmother    AAA (abdominal aortic aneurysm) Maternal Grandmother    COPD Maternal Grandmother    Prostate cancer Maternal Grandfather    AAA (abdominal aortic aneurysm) Maternal Grandfather    Kidney failure  Maternal Grandfather    Leukemia Other    Diabetes Other    PE: BP 120/70   Pulse (!) 120   Ht 5' 6.5" (1.689 m)   Wt 145 lb 12.8 oz (66.1 kg)   LMP 05/17/2018 (Exact Date)   SpO2 98%   BMI 23.18 kg/m heart rate recheck at at the end of the visit: 90 bpm Wt Readings from Last 3 Encounters:  10/20/23 145 lb 12.8 oz (66.1 kg)  08/03/23 148 lb (67.1 kg)  04/20/23 146 lb (66.2 kg)   Constitutional: normal weight, in NAD Eyes:  EOMI, no exophthalmos ENT: no neck masses, no cervical lymphadenopathy Cardiovascular: tachycardia, RR, No MRG Respiratory: CTA B Musculoskeletal: no deformities Skin:no rashes Neurological: no tremor with outstretched hands  ASSESSMENT: 1. Postablative Hypothyroidism  PLAN:  1. Patient with longstanding, post ablated, hypothyroidism, on brand-name Synthroid.  In the past, she had uncontrolled hypothyroidism due to missing Synthroid doses, but after starting to take this consistently and correctly, her TFTs normalized.  We subsequently had to reduce her Synthroid doses, last dose change 04/2023. - latest thyroid labs reviewed with pt. >> TSH slightly suppressed: Lab Results  Component Value Date   TSH 0.26 (L) 04/20/2023  - she continues on LT4 112 mcg daily, dose decreased after the above results returned.  She did not return for follow-up labs in 1.5 months, as advised. - pt feels good on this dose.  Before last visit, she was on phentermine and lost 17 pounds and we discussed that this could induce a requirement for a lower dose of levothyroxine.  She did have tachycardia at that time and we discussed that the sooner she gets to goal weight, to try to stop phentermine.  She did decrease the dose to half since then.  She lost a net 1 pound since last visit. - we discussed about taking the thyroid hormone every day, with water, >30 minutes before breakfast, separated by >4 hours from acid reflux medications, calcium, iron, multivitamins. Pt. is taking it  correctly. - will check thyroid tests today: TSH and fT4.  She usually prefers to  get these in Dr. Jinny Sanders office, where she works, between appointments. - If labs are abnormal, she will need to return for repeat TFTs in 1.5 months -Otherwise, we will see her back in a year, but sooner for labs  Needs refills.  Carlus Pavlov, MD PhD [facility] Endocr[facility]ology

## 2023-10-20 NOTE — Patient Instructions (Signed)
 Please take the Synthroid 112 mcg daily.  Take the thyroid hormone every day, with water, at least 30 minutes before breakfast, separated by at least 4 hours from: - acid reflux medications - calcium - iron - multivitamins  Please stop at the lab.  Please return in 1 year, but possibly sooner for labs.

## 2023-10-21 LAB — T4, FREE: Free T4: 1.8 ng/dL (ref 0.8–1.8)

## 2023-10-21 LAB — TSH: TSH: 0.97 m[IU]/L

## 2023-10-22 ENCOUNTER — Encounter: Payer: Self-pay | Admitting: Internal Medicine

## 2023-10-22 MED ORDER — SYNTHROID 112 MCG PO TABS: 112.0000 ug | ORAL_TABLET | Freq: Every day | ORAL | 3 refills | Status: AC

## 2023-10-22 NOTE — Addendum Note (Signed)
 Addended by: Carlus Pavlov on: 10/22/2023 04:41 PM   Modules accepted: Orders

## 2023-12-24 ENCOUNTER — Encounter: Payer: Self-pay | Admitting: Internal Medicine

## 2023-12-24 DIAGNOSIS — E890 Postprocedural hypothyroidism: Secondary | ICD-10-CM

## 2023-12-25 ENCOUNTER — Telehealth: Payer: Self-pay

## 2023-12-25 DIAGNOSIS — E890 Postprocedural hypothyroidism: Secondary | ICD-10-CM

## 2023-12-25 NOTE — Addendum Note (Signed)
 Addended by: Vernon Goodpasture on: 12/25/2023 08:01 AM   Modules accepted: Orders

## 2023-12-25 NOTE — Addendum Note (Signed)
 Addended by: Vernon Goodpasture on: 12/25/2023 07:58 AM   Modules accepted: Orders

## 2023-12-25 NOTE — Telephone Encounter (Signed)
 Orders Placed This Encounter  Procedures   TSH   T4, free

## 2024-01-27 ENCOUNTER — Encounter: Payer: Self-pay | Admitting: Gynecologic Oncology

## 2024-01-29 ENCOUNTER — Other Ambulatory Visit (HOSPITAL_COMMUNITY)
Admission: RE | Admit: 2024-01-29 | Discharge: 2024-01-29 | Disposition: A | Discharge status: Home / Self Care | Source: Ambulatory Visit | Attending: Gynecologic Oncology | Admitting: Gynecologic Oncology

## 2024-01-29 ENCOUNTER — Inpatient Hospital Stay: Payer: 59 | Attending: Gynecologic Oncology | Admitting: Gynecologic Oncology

## 2024-01-29 ENCOUNTER — Encounter: Payer: Self-pay | Admitting: Gynecologic Oncology

## 2024-01-29 VITALS — BP 131/91 | HR 100 | Temp 98.2°F | Resp 17 | Ht 66.5 in | Wt 146.0 lb

## 2024-01-29 DIAGNOSIS — Z9221 Personal history of antineoplastic chemotherapy: Secondary | ICD-10-CM | POA: Insufficient documentation

## 2024-01-29 DIAGNOSIS — E8940 Asymptomatic postprocedural ovarian failure: Secondary | ICD-10-CM | POA: Insufficient documentation

## 2024-01-29 DIAGNOSIS — Z90710 Acquired absence of both cervix and uterus: Secondary | ICD-10-CM | POA: Insufficient documentation

## 2024-01-29 DIAGNOSIS — Z8544 Personal history of malignant neoplasm of other female genital organs: Secondary | ICD-10-CM | POA: Diagnosis present

## 2024-01-29 DIAGNOSIS — Z780 Asymptomatic menopausal state: Secondary | ICD-10-CM

## 2024-01-29 DIAGNOSIS — Z923 Personal history of irradiation: Secondary | ICD-10-CM | POA: Diagnosis not present

## 2024-01-29 DIAGNOSIS — N952 Postmenopausal atrophic vaginitis: Secondary | ICD-10-CM | POA: Insufficient documentation

## 2024-01-29 DIAGNOSIS — Z9079 Acquired absence of other genital organ(s): Secondary | ICD-10-CM | POA: Diagnosis not present

## 2024-01-29 DIAGNOSIS — Z90722 Acquired absence of ovaries, bilateral: Secondary | ICD-10-CM | POA: Insufficient documentation

## 2024-01-29 DIAGNOSIS — C52 Malignant neoplasm of vagina: Secondary | ICD-10-CM | POA: Diagnosis present

## 2024-01-29 MED ORDER — ESTRADIOL 1 MG PO TABS
1.0000 mg | ORAL_TABLET | Freq: Every day | ORAL | 12 refills | Status: DC
Start: 2024-01-29 — End: 2024-02-24

## 2024-01-29 NOTE — Patient Instructions (Signed)
 It was good to see you today.  I do not see or feel any evidence of cancer recurrence on your exam.  We will see you for follow-up in 12 months.  As always, if you develop any new and concerning symptoms before your next visit, please call to see me sooner.

## 2024-01-29 NOTE — Progress Notes (Signed)
 Gynecologic Oncology Return Clinic Visit  01/29/24  Reason for Visit: Surveillance visit in the setting of a history of vaginal cancer   Treatment History: Oncology History  Vaginal cancer (HCC)  03/01/2013 Surgery   OPERATIVE REPORT   PREOPERATIVE DIAGNOSIS: High-grade dysplasia on Pap smear   POSTOPERATIVE DIAGNOSIS: Same   PROCEDURE: Exam under anesthesia, endocervical curettage, vaginal biopsies   SURGEON: Toribio Percy, M.D  SURGICAL FINDINGS: Examination under anesthesia revealed a cervix which was flush with the vaginal fornices. The cervical canal was stenotic but once entered and dilated the uterus sounded approximate 7 cm anteriorly. Apply Lugol solution to the cervix showed no lesions. There were 2 areas of non-uptake of Lugol's in the posterior vagina which were biopsied   11/09/2016 Surgery   Preoperative diagnosis: Short cervix S/P cervical conization for microinvasive cervical cancer, intrauterine pregnancy at 11 weeks   Postoperative diagnosis: Short cervix S/P cervical conization for microinvasive cervical cancer, intrauterine pregnancy at 11 weeks   Procedure: Laparoscopy, transabdominal cervical isthmic cerclage x 2, intraoperative ultrasound guidance   Anesthesia: Gen. Endotracheal   Surgeon:Tamer Yalcinkaya, MD Findings: On exam under anesthesia the cervix was closed and deviated to the left. On laparoscopy the liver, gallbladder, and appendix were normal.  The uterus was gravid and retroverted and retroflexed, both tubes and ovaries appeared normal.   Intraoperative ultrasound transabdominally and transvaginally showed a singleton intrauterine pregnancy at length consistent with 11 weeks. There was fetal cardiac activity and movements.     01/19/2018 Pathology Results   Cervical biopsy showed high grade squamous intraepithelial with severe dysplasia   02/09/2018 Pathology Results   Cervix, biopsy - SUPERFICIAL FRAGMENTS OF CERVICAL MUCOSA SHOWING AT  LEAST HIGH GRADE SQUAMOUS INTRAEPITHELIAL LESION (HSIL, CIN-III). - DEFINITIVE INVASION IS NOT SEEN BUT CANNOT BE ENTIRELY RULED OUT.   02/18/2018 Pathology Results   1. Cervix, cone - BENIGN SQUAMOUS AND ENDOCERVICAL MUCOSA, SEE COMMENT. - NO DYSPLASIA OR MALIGNANCY. 2. Endocervix, curettage - BLOOD. - NO MUCOSA PRESENT. Microscopic Comment 1. p16 is negative in the squamous epithelium   02/18/2018 Surgery   Preop Diagnosis: CIN III   Postoperative Diagnosis: same   Surgery: cold knife conization of cervix    Surgeons:  Eloy Maurilio Fitch, MD Pathology: cervical cone with marking stitch at 12 o'clock, post cone ECC   Operative findings: no grossly visible exophytic lesions on ectocervix. Cervix flush with vagina.     03/11/2018 Pathology Results   Vagina, biopsy, posterior upper vaginal wall - HIGH GRADE SQUAMOUS DYSPLASIA, SEE COMMENT. Microscopic Comment There are detached squamous fragments with high grade dysplastic features. Several of the fragments have a minimal amount of attached stroma which is inflamed and focally fibrotic. While an invasive component cannot be ruled out, the limited stroma present hampers evaluate for invasion   04/27/2018 Pathology Results   Vagina, biopsy, posterior - SUPERFICIAL FRAGMENTS OF EXTENSIVE SQUAMOUS CELL CARCINOMA IN SITU. - SEE NOTE. Diagnosis Note Though focally suspicious, definitive evidence of invasive carcinoma is not seen in the submitted biopsies. The submitted fragments are superficial in nature and a deeper, more severe process cannot be ruled out. Dr Alvaro has reviewed this case and concurs with the above interpretation.   06/01/2018 Pathology Results   Uterus +/- tubes/ovaries, neoplastic, upper 1/3 of vagina - INVASIVE MODERATELY DIFFERENTIATED SQUAMOUS CELL CARCINOMA, 3.0 CM, INVOLVING POSTERIOR WALL OF UPPER THIRD OF VAGINA. - CARCINOMA INVOLVES THE CAUTERIZED DEEP RESECTION MARGIN; MUCOSAL MARGINS ARE NOT INVOLVED. -  LYMPHOVASCULAR INVASION IS PRESENT. - UTERUS WITH  BENIGN PROLIFERATIVE ENDOMETRIUM. - BENIGN UNREMARKABLE CERVIX. - BENIGN UNREMARKABLE BILATERAL FALLOPIAN TUBE. - SEE ONCOLOGY TABLE. - SEE NOTE. Microscopic Comment VAGINA: Resection Procedure: Partial vaginectomy with total hysterectomy and bilateral salpingectomy. Tumor Site: Posterior wall of upper third of vagina. Tumor Size: 3.0 cm. Histologic Type: Squamous cell carcinoma. Histologic Grade: G2: Moderately differentiated. Other Tissue/ Organ: Uterus and bilateral fallopian tubes are not involved by carcinoma. Margins: Deep cauterized margin is positive for carcinoma. Lymphovascular Invasion: Present. Regional Lymph Nodes: No lymph nodes submitted or found. Number of Lymph Nodes Examined: 0. Pathologic Stage Classification (pTNM, AJCC 8th Edition): pT1b, pNX. Representative Tumor Block: 1C.  Lesion is HPV positive   06/01/2018 Surgery   Pre-operative Diagnosis: history of cervical cancer, VAIN III of upper posterior vagina   Post-operative Diagnosis: same   Operation: Robotic-assisted laparoscopic total hysterectomy with bilateral salpingectomy with upper vaginectomy and oophoropexy   Surgeon: Eloy Maurilio Fitch   Operative Findings:  : 6cm uterus with normal tubes and ovaries (benign appearing functional cyst on left ovary measuring 2cm). Cerclage (merseline) in place with knot anteriorally. Bladder flap adhesions. 3x3cm discoid raised erythematous lesion on posterior upper wall of vagina with close proximity to cervix. No visible lesion remaining in vagina at completion of procedure.     06/22/2018 Imaging   Ct imaging 1. Status post total abdominal hysterectomy. No definitive findings to strongly suggest metastatic disease to the chest, abdomen or pelvis. 2. 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 Further evaluation with nonemergent MRI of the abdomen with  and without IV gadolinium is strongly recommended to provide definitive characterization of this lesion in the near future. 3. Two small pulmonary nodules measuring 5 mm or less in size, nonspecific but statistically likely benign. Attention on routine follow-up studies is recommended to ensure their stability. 4. 4 mm nonobstructive calculus in the upper pole collecting system of the right kidney. No ureteral stones or findings of urinary tract obstruction are noted at this time   07/02/2018 PET scan   1. Hypermetabolic right internal iliac lymph nodes, consistent with metastatic disease. 2. Hypermetabolic cervical lymph nodes bilaterally. This would be atypical metastatic spread for the reported history of vaginal cancer. The patient does have diffuse, but symmetric hypermetabolic uptake in the nasopharynx and hypopharynx. Infectious/inflammatory etiology a consideration although neoplasm not excluded. 3. Hypermetabolic FDG accumulation in the right ovary, nonspecific with no gross mass lesion evident. Left ovary is high in the left pelvis with low level FDG uptake. 4. Tiny bilateral pulmonary nodules as seen on previous diagnostic CT. These are below threshold size for reliable resolution on PET imaging.     07/08/2018 Cancer Staging   Staging form: Vagina, AJCC 7th Edition - Pathologic: Stage III (T1, N1, cM0) - Signed by Lonn Hicks, MD on 07/08/2018   07/12/2018 Procedure   Technically successful right IJ power-injectable port catheter placement. Ready for routine use.   07/14/2018 - 09/27/2018 Radiation Therapy   Radiation treatment dates:   1. 07/14/18 - 08/19/18                                                  2. 08/20/18-08/31/18  3. 09/06/18, 09/15/18, 09/22/18, 09/27/18   Site/dose: 1. Pelvis; 25 fractions of 1.8 Gy for a total of 45 Gy                    2. Pelvis (nodal boost) ; 8 fractions of 1.8 Gy for a total of 14.4 Gy (59.4 Gy cumulative  to involved nodes)                    3.  Vagina; 6 Gy in 4 fractions for a total dose of 24 Gy   Beams/energy: 1. IMRT Photon; 6X                            2. IMRT Photon; 6X                            3. HDR Ir-Vaginal, Iridium HDR, patient was treated with a 2.5 cm diameter segmented cylinder, Rx length of 3.5 cm, prescription was 6 gray to the mucosal surface   07/16/2018 - 08/27/2018 Chemotherapy   She received weekly cisplatin    12/28/2018 PET scan   1. 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 2. No evidence of residual or recurrent hypermetabolic disease. 3. Similar bilateral pulmonary nodules, below PET resolution   01/14/2019 Procedure   Removal of implanted Port-A-Cath utilizing sharp and blunt dissection. The procedure was uncomplicated    Patient had a history of stage IA1 diagnosed by a LEEP and repeat CKC on 04/01/2010.  Fertility preservation was chosen.  The patient was subsequently followed with Pap smears.    She had intermittently abnormal paps smears over the subsequent years with close follow-up and regular colposcopic evaluations.   March 21, 2016 Pap smear showed HGSIL.  Endocervical curettage showed benign endocervical glands.   Pap smear July 15, 2016 showed HGSIL.  Endocervical curettage showed benign endocervical glands.   January 13, 2018 Pap smear showed HGSIL.   An exam by Dr Devonna on July 9th showed a friable exophytic lesion on the cervix which was biopsied. Final pathology showed at least CIN III, invasion could not be ruled out. Colpo had been difficult as the cervix was flush with the vagina and the upper vagina was narrowed.   The patient was interested in a definitive hysterectomy procedure however she required an excisional procedure to rule out invasive carcinoma before having this.   On 02/18/18 she underwent cold knife conization of the cervix in the operating room.  Inspection of the cervix with acetic acid  application  was grossly unremarkable with no apparent lesions seen.  The upper vagina was also grossly normal.  The cervix was small, fairly flush with the surrounding vagina, however the conization procedure was relatively straightforward.  A 0.9 cm AP dimension by 1.5 cm deep dimension cone was taken without difficulty.  There is minimal bleeding in the operating room. Final pathology revealed no residual dysplasia.   On postoperative examination a friable lesion was seen on the posterior upper vaginal wall measuring approximately 2cm. It was separate to, but abutting, the posterior lip of the cervix. 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    The patient was recommended surgical excision of the lesion, and due to its proximity with the cervix, was recommended hysterectomy (without oophorectomy) at the time of surgery in order to establish adequate margins.  The patient had her surgery scheduled (robotic hysterectomy, upper vaginectomy), however canceled this after she discussed the proposed surgery with her referring doctor, Dr Gorge, who had concerns about the surgical plan. Dr Gorge requested that Emberlie see my partner, Dr Devonna for a second opinion.    On 04/27/18 Javeria was evaluated by Dr Devonna who visualized the posterior wall lesion and repeated biopsies (superficial fragments of extensive squamous cell carcinoma in situ).  Given his shared concern that this lesion may harbor  occult invasive disease, and due to its proximity to the cervix, he agreed with the surgical plan of hysterectomy (not oophorectomy) with salpingectomy and upper vaginectomy.    On June 01, 2018 the patient underwent a robotic assisted total hysterectomy with upper vaginectomy bilateral salpingectomy and due for pexy.  The surgical procedure was unremarkable and uncomplicated.  Operative findings were significant for a grossly normal uterus and cervix  and tubes and ovaries.  A 3 x 3 cm discoid raised erythematous lesion was visualized vaginally on the posterior upper wall of the vagina with close proximity to the cervix (approximately 7 mm margin to the cervicovaginal junction.  At the completion of the procedure there was no visible remaining lesion in the vagina.  Via intraperitoneal inspection there was no eruption of the tumor through into the intraperitoneal space.  The rectovaginal septum was created with a ease and there was no gross involvement of the anterior rectal wall during surgery.  Inspection of the specimen revealed the margins to be grossly clear of disease.  At the completion of the procedure the ovaries were pexed to the pelvic brim.   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  The carcinoma involved the cauterized deep resection margin representing the rectovaginal septal margin.  Lymphovascular space invasion was present.  The cervix and uterus and fallopian tubes are grossly unremarkable.   Postop PET showed a PET avid Rt pelvic lymph node consistent with metastatic disease (stage IIIC).    She was treated with radiosensitizing weekly CDDP with whole pelvic RT and vaginal brachytherapy (the pelvis received 25 fractions of 1.8 Gy for a total of 45 Gray, there was a pelvic nodal boost of 8 fractions of 1.8 Gray for a total of 14.4 Gy, cumulative dose to involve nodes of 59.4 Gy; the vagina received 6 Gy in 4 fractions for total dose of 24 Gray).  Treatment dates 07-14-18 to 09/27/18.    Repeat PET imaging 3 months post treatment was performed on Dec 28, 2018 and revealed response to therapy of the right pelvic nodal metastases with no increased uptake in the nodal basins and no lymphadenopathy.  There is no evidence of residual recurrent disease.  There was similar findings to the bilateral pulmonary nodules which appeared benign and  stable from prior imaging.   She tolerated treatment well with no major toxicities.   Interval History: Doing well.  Denies any vaginal bleeding or discharge.  Reports excellent bowel and bladder function.  Having some hot flashes but thinks this may be related to issues with heat and AC at work.  Past Medical/Surgical History: Past Medical History:  Diagnosis Date   Cancer (HCC) DX 2011   CERVICAL    Cough    WITH SEASONAL ALLEGIES, NONPRODUCTIVE LAST 2 DAYS   H/O radioactive iodine  thyroid  ablation 12/2009   High grade squamous intraepithelial lesion of cervix    History of cervical dysplasia  History of hyperthyroidism    per pt dx 2000 -- s/p RAI  2011   History of kidney stones 2011   History of radiation therapy 07/14/2018-09/27/2018   Pelvic/vagina HDR; Dr. 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 Start laparoscopically HOLD OPEN ABDOMINAL ITEMS    CERCLAGE LAPAROSCOPIC ABDOMINAL N/A 11/19/2016   Procedure: CERCLAGE LAPAROSCOPIC ABDOMINAL;  Surgeon: Cynthia Loss, MD;  Location: [facility];  Service: Gynecology;  Laterality: N/A;   CERVICAL CONIZATION W/BX N/A 02/18/2018   Procedure: COLD KNIFE CONIZATION OF CERVIX WITH POSSIBLE BIOPSY;  Surgeon: Eloy Herring, MD;  Location: [facility];   Service: Gynecology;  Laterality: N/A;   CESAREAN SECTION N/A 05/27/2017   Procedure: Primary CESAREAN SECTION;  Surgeon: Gorge Ade, MD;  Location: [facility];  Service: Obstetrics;  Laterality: N/A;  EDD: 11/4/18Allergy: Adhesive, Relpax, Sulfa, Ultram   COLD KNIFE CONIZATION  03-29-2010   dr gorge  [facility]   DILATION AND CURETTAGE OF UTERUS  2009   DILATION AND CURETTAGE OF UTERUS N/A 03/01/2013   Procedure: CERVICAL DILATATION AND ENDOCERVICAL CURRETAGE AND VAAGINAL BIOPIES;  Surgeon: Toribio LITTIE Percy, MD;  Location: [facility];  Service: Gynecology;  Laterality: N/A;   EXTRACORPOREAL SHOCK WAVE LITHOTRIPSY Right 05/10/2020   Procedure: EXTRACORPOREAL SHOCK WAVE LITHOTRIPSY (ESWL);  Surgeon: Elisabeth Valli BIRCH, MD;  Location: [facility];  Service: Urology;  Laterality: Right;   IR IMAGING GUIDED PORT INSERTION  07/12/2018   IR REMOVAL TUN ACCESS W/ PORT W/O FL MOD SED  01/14/2019   LEEP  02/25/2010;  12/ 2012  dr gorge office   NASAL SEPTUM SURGERY  2006   Repair of deviated septum   SIGMOIDOSCOPY  2009   TONSILLECTOMY AND ADENOIDECTOMY  1995    Family History  Problem Relation Age of Onset   Diabetes Mother    Skin cancer Mother    Hyperlipidemia Mother    Breast cancer Mother        Right   Hypertension Father    Asthma Father    Skin cancer Maternal Aunt    Multiple myeloma Maternal Aunt    Testicular cancer Maternal Uncle    Hyperlipidemia Maternal Grandmother    AAA (abdominal aortic aneurysm) Maternal Grandmother    COPD Maternal Grandmother    Prostate cancer Maternal Grandfather    AAA (abdominal aortic aneurysm) Maternal Grandfather    Kidney failure Maternal Grandfather    Leukemia Other    Diabetes Other     Social History   Socioeconomic History   Marital status: Single    Spouse name: Not on file   Number of children: 1   Years of education: Not on file   Highest education level: Not on file  Occupational History   Occupation:  Triage  Tobacco Use   Smoking status: Never   Smokeless tobacco: Never  Vaping Use   Vaping status: Never Used  Substance and Sexual Activity   Alcohol use: Not Currently    Comment: SELDOM, NONE IN  10 MONTHS   Drug use: No   Sexual activity: Yes    Birth control/protection: Surgical  Other Topics Concern   Not on file  Social History Narrative   Not on file   Social Drivers of Health   Financial Resource Strain: Low Risk  (08/07/2023)   Received from [facility]   Overall Financial Resource Strain (CARDIA)    Difficulty of Paying Living Expenses: Not hard at all  Food Insecurity: No Food Insecurity (08/07/2023)   Received from [facility]   Hunger Vital Sign    Within the past 12 months, you worried that your food would run out before you got the money to buy more.: Never true    Within the past 12 months, the food you bought just didn't last and you didn't have money to get more.: Never true  Transportation Needs: No Transportation Needs (08/07/2023)   Received from [facility] - Transportation    Lack of Transportation (Medical): No    Lack of Transportation (Non-Medical): No  Physical Activity: Insufficiently Active (08/07/2023)   Received from [facility]   Exercise Vital Sign    On average, how many days per week do you engage in moderate to strenuous exercise (like a brisk walk)?: 4 days    On average, how many minutes do you engage in exercise at this level?: 10 min  Stress: No Stress Concern Present (08/07/2023)   Received from [facility] - Occupational Stress Questionnaire    Feeling of Stress : Not at all  Social Connections: Socially Integrated (08/07/2023)   Received from [facility]   Social Network    How would you rate your social network (family, work, friends)?: Good participation with social networks    Current Medications:  Current Outpatient Medications:    Azelaic Acid (FINACEA) 15 % FOAM,  APPLY TWICE A DAY; Duration: 90, Disp: , Rfl:    co-enzyme Q-10 30 MG capsule, as directed Orally, Disp: , Rfl:    Cyanocobalamin 100 MCG LOZG, as directed Orally, Disp: , Rfl:    estradiol  (ESTRACE ) 1 MG tablet, Take 1 tablet (1 mg total) by mouth daily., Disp: 30 tablet, Rfl: 12   LamoTRIgine  200 MG TB24 24 hour tablet, Take 2 tablets (400 mg total) by mouth 2 (two) times daily., Disp: 360 tablet, Rfl: 4   levETIRAcetam  (KEPPRA ) 750 MG tablet, Take 2 tablets (1,500 mg total) by mouth 2 (two) times daily., Disp: 360 tablet, Rfl: 4   levocetirizine (XYZAL) 5 MG tablet, Take 5 mg by mouth every evening., Disp: , Rfl:    Multiple Vitamin (MULTIVITAMIN WITH MINERALS) TABS tablet, Take 1 tablet by mouth daily., Disp: , Rfl:    Omega 3 1200 MG CAPS, Take by mouth., Disp: , Rfl:    phentermine (ADIPEX-P) 37.5 MG tablet, Take 37.5 mg by mouth every morning., Disp: , Rfl:    Red Yeast Rice Extract (RED YEAST RICE PO), Take by mouth., Disp: , Rfl:    SYNTHROID  112 MCG tablet, Take 1 tablet (112 mcg total) by mouth daily before breakfast., Disp: 90 tablet, Rfl: 3  Review of Systems: Denies appetite changes, fevers, chills, fatigue, unexplained weight changes. Denies hearing loss, neck lumps or  masses, mouth sores, ringing in ears or voice changes. Denies cough or wheezing.  Denies shortness of breath. Denies chest pain or palpitations. Denies leg swelling. Denies abdominal distention, pain, blood in stools, constipation, diarrhea, nausea, vomiting, or early satiety. Denies pain with intercourse, dysuria, frequency, hematuria or incontinence. Denies hot flashes, pelvic pain, vaginal bleeding or vaginal discharge.   Denies joint pain, back pain or muscle pain/cramps. Denies itching, rash, or wounds. Denies dizziness, headaches, numbness or seizures. Denies swollen lymph nodes or glands, denies easy bruising or bleeding. Denies anxiety, depression, confusion, or decreased concentration.  Physical  Exam: BP (!) 131/91 (BP Location: Left Arm, Patient Position: Sitting)   Pulse 100   Temp 98.2 F (36.8 C) (Oral)   Resp 17   Ht 5' 6.5 (1.689 m)   Wt 146 lb (66.2 kg)   LMP 05/17/2018 (Exact Date)   SpO2 98%   BMI 23.21 kg/m  General: Alert, oriented, no acute distress. HEENT: Normocephalic, atraumatic, sclera anicteric. Chest: Clear to auscultation bilaterally.  No wheezes or rhonchi. Cardiovascular: Regular rate and rhythm, no murmurs. Abdomen: soft, nontender.  Normoactive bowel sounds.  No masses or hepatosplenomegaly appreciated.  Small periumbilical hernia. Extremities: Grossly normal range of motion.  Warm, well perfused.  No edema bilaterally. Skin: No rashes or lesions noted. Lymphatics: No cervical, supraclavicular, or inguinal adenopathy. GU: Normal appearing external genitalia without erythema, excoriation, or lesions.  Speculum exam reveals moderately atrophic vaginal mucosa with shortened vagina, no lesions or masses noted but there is petechiae over the upper surfaces of the vagina, likely related to atrophy and radiation changes.  Bimanual exam reveals shortened vagina, smooth, no nodularity.  Pap test and HPV collected. Rectovaginal exam confirms these findings.  Laboratory & Radiologic Studies: None new  Assessment & Plan: Kelly Mcclure is a 43 y.o. woman with  history of stage IIIC (posterior) vaginal cancer diagnosed and resected on 06/01/18. Positive deep margin (rectovaginal septum).  S/p adjuvant whole pelvic RT with vaginal brachytherapy and radiosensitizing CDDP completed February, 2020. Complete clinical response on post-treatment PET and physical exam. Last pap test in 01/2023 and negative. 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 Vaginal narrowing and atrophy secondary to radiation and menopause.  Overall, patient is doing well.  She is NED on exam today.  Pap and HPV collected today. I will contact her with these results.   We had  previously tried Vivelle -Dot and Climara  for her hot flashes in regard to hormone replacement therapy.  Unfortunately, she had a reaction to both adhesives. Oral estradiol  sent in today for her to try given symptoms.   She is now more than 5 years out from completing treatment.  We will transition to at least a couple of years of yearly visits in our office with Melissa.  We reviewed signs and symptoms that should prompt a phone call sooner than that.  22 minutes of total time was spent for this patient encounter, including preparation, face-to-face counseling with the patient and coordination of care, and documentation of the encounter.  Comer Dollar, MD  Division of Gynecologic Oncology  Department of Obstetrics and Gynecology  [facility]

## 2024-02-09 ENCOUNTER — Ambulatory Visit: Payer: Self-pay | Admitting: Gynecologic Oncology

## 2024-02-09 LAB — CYTOLOGY - PAP
Comment: NEGATIVE
Diagnosis: NEGATIVE
High risk HPV: NEGATIVE

## 2024-02-19 NOTE — Progress Notes (Signed)
 Does not look like she read my message.  Would you mind calling her with her Pap result?  HPV is negative and Pap test is normal.  Thank you!

## 2024-02-19 NOTE — Telephone Encounter (Signed)
-----   Message from Comer JONELLE Dollar sent at 02/19/2024  2:23 PM EDT ----- Does not look like she read my message.  Would you mind calling her with her Pap result?  HPV is negative and Pap test is normal.  Thank you! ----- Message ----- From: Interface, [facility] Sent: 02/09/2024   5:25 PM EDT To: Comer JONELLE Dollar, MD

## 2024-02-19 NOTE — Telephone Encounter (Signed)
 Spoke with Kelly Mcclure  and relayed message from Dr. Viktoria that patient's Pap results are back and HPV is negative and Pap test is normal. Pt states, Great, thank you for calling![name]

## 2024-02-24 ENCOUNTER — Other Ambulatory Visit: Payer: Self-pay | Admitting: Gynecologic Oncology

## 2024-02-24 DIAGNOSIS — Z780 Asymptomatic menopausal state: Secondary | ICD-10-CM

## 2024-10-10 ENCOUNTER — Ambulatory Visit: Admitting: Internal Medicine

## 2024-10-17 ENCOUNTER — Ambulatory Visit: Admitting: Internal Medicine

## 2025-02-07 ENCOUNTER — Ambulatory Visit: Admitting: Gynecologic Oncology
